# Patient Record
Sex: Male | Born: 1950
Health system: Southern US, Community
[De-identification: ages and names within clinical notes are randomized; demographics above are authoritative.]

## PROBLEM LIST (undated history)

## (undated) DIAGNOSIS — C028 Malignant neoplasm of overlapping sites of tongue: Secondary | ICD-10-CM

## (undated) DIAGNOSIS — I10 Essential (primary) hypertension: Secondary | ICD-10-CM

## (undated) DIAGNOSIS — J439 Emphysema, unspecified: Secondary | ICD-10-CM

## (undated) DIAGNOSIS — K219 Gastro-esophageal reflux disease without esophagitis: Secondary | ICD-10-CM

## (undated) DIAGNOSIS — G459 Transient cerebral ischemic attack, unspecified: Secondary | ICD-10-CM

## (undated) DIAGNOSIS — J449 Chronic obstructive pulmonary disease, unspecified: Secondary | ICD-10-CM

## (undated) DIAGNOSIS — L0211 Cutaneous abscess of neck: Secondary | ICD-10-CM

## (undated) DIAGNOSIS — N182 Chronic kidney disease, stage 2 (mild): Secondary | ICD-10-CM

## (undated) DIAGNOSIS — R600 Localized edema: Secondary | ICD-10-CM

## (undated) DIAGNOSIS — Z860100 Personal history of colon polyps, unspecified: Secondary | ICD-10-CM

## (undated) DIAGNOSIS — J45909 Unspecified asthma, uncomplicated: Secondary | ICD-10-CM

## (undated) DIAGNOSIS — R490 Dysphonia: Secondary | ICD-10-CM

## (undated) DIAGNOSIS — M1990 Unspecified osteoarthritis, unspecified site: Secondary | ICD-10-CM

## (undated) DIAGNOSIS — I7090 Unspecified atherosclerosis: Secondary | ICD-10-CM

## (undated) DIAGNOSIS — R350 Frequency of micturition: Secondary | ICD-10-CM

## (undated) DIAGNOSIS — F32A Depression, unspecified: Secondary | ICD-10-CM

## (undated) DIAGNOSIS — R7303 Prediabetes: Secondary | ICD-10-CM

## (undated) DIAGNOSIS — N390 Urinary tract infection, site not specified: Secondary | ICD-10-CM

## (undated) DIAGNOSIS — F0390 Unspecified dementia without behavioral disturbance: Secondary | ICD-10-CM

## (undated) DIAGNOSIS — E079 Disorder of thyroid, unspecified: Secondary | ICD-10-CM

## (undated) DIAGNOSIS — I639 Cerebral infarction, unspecified: Secondary | ICD-10-CM

## (undated) DIAGNOSIS — R569 Unspecified convulsions: Secondary | ICD-10-CM

## (undated) DIAGNOSIS — E785 Hyperlipidemia, unspecified: Secondary | ICD-10-CM

## (undated) DIAGNOSIS — F419 Anxiety disorder, unspecified: Secondary | ICD-10-CM

## (undated) DIAGNOSIS — R0600 Dyspnea, unspecified: Secondary | ICD-10-CM

## (undated) DIAGNOSIS — M109 Gout, unspecified: Secondary | ICD-10-CM

## (undated) DIAGNOSIS — F329 Major depressive disorder, single episode, unspecified: Secondary | ICD-10-CM

## (undated) DIAGNOSIS — R339 Retention of urine, unspecified: Secondary | ICD-10-CM

## (undated) HISTORY — PX: HERNIA REPAIR: SHX51

## (undated) HISTORY — PX: OTHER SURGICAL HISTORY: SHX169

---

## 2012-11-06 DIAGNOSIS — I639 Cerebral infarction, unspecified: Secondary | ICD-10-CM

## 2012-11-06 HISTORY — DX: Cerebral infarction, unspecified: I63.9

## 2015-01-16 ENCOUNTER — Emergency Department (HOSPITAL_COMMUNITY): Payer: Self-pay

## 2015-01-16 ENCOUNTER — Encounter (HOSPITAL_COMMUNITY): Payer: Self-pay | Admitting: Emergency Medicine

## 2015-01-16 ENCOUNTER — Emergency Department (HOSPITAL_COMMUNITY)
Admission: EM | Admit: 2015-01-16 | Discharge: 2015-01-16 | Disposition: A | Discharge status: Home / Self Care | Payer: Self-pay | Attending: Emergency Medicine | Admitting: Emergency Medicine

## 2015-01-16 DIAGNOSIS — R609 Edema, unspecified: Secondary | ICD-10-CM | POA: Insufficient documentation

## 2015-01-16 DIAGNOSIS — R600 Localized edema: Secondary | ICD-10-CM

## 2015-01-16 DIAGNOSIS — I7090 Unspecified atherosclerosis: Secondary | ICD-10-CM

## 2015-01-16 DIAGNOSIS — M7989 Other specified soft tissue disorders: Secondary | ICD-10-CM | POA: Insufficient documentation

## 2015-01-16 DIAGNOSIS — J441 Chronic obstructive pulmonary disease with (acute) exacerbation: Secondary | ICD-10-CM | POA: Insufficient documentation

## 2015-01-16 DIAGNOSIS — Z8673 Personal history of transient ischemic attack (TIA), and cerebral infarction without residual deficits: Secondary | ICD-10-CM | POA: Insufficient documentation

## 2015-01-16 DIAGNOSIS — Z8639 Personal history of other endocrine, nutritional and metabolic disease: Secondary | ICD-10-CM | POA: Insufficient documentation

## 2015-01-16 DIAGNOSIS — I10 Essential (primary) hypertension: Secondary | ICD-10-CM | POA: Insufficient documentation

## 2015-01-16 HISTORY — DX: Transient cerebral ischemic attack, unspecified: G45.9

## 2015-01-16 HISTORY — DX: Disorder of thyroid, unspecified: E07.9

## 2015-01-16 HISTORY — DX: Chronic obstructive pulmonary disease, unspecified: J44.9

## 2015-01-16 HISTORY — DX: Localized edema: R60.0

## 2015-01-16 HISTORY — DX: Unspecified atherosclerosis: I70.90

## 2015-01-16 LAB — COMPREHENSIVE METABOLIC PANEL
ALBUMIN: 3.6 g/dL (ref 3.5–5.2)
ALK PHOS: 44 U/L (ref 39–117)
ALT: 18 U/L (ref 0–53)
AST: 18 U/L (ref 0–37)
Anion gap: 7 (ref 5–15)
BILIRUBIN TOTAL: 0.5 mg/dL (ref 0.3–1.2)
BUN: 11 mg/dL (ref 6–23)
CHLORIDE: 110 mmol/L (ref 96–112)
CO2: 25 mmol/L (ref 19–32)
Calcium: 8.6 mg/dL (ref 8.4–10.5)
Creatinine, Ser: 0.86 mg/dL (ref 0.50–1.35)
GFR calc Af Amer: >90 mL/min (ref >90)
GFR calc non Af Amer: >90 mL/min (ref >90)
Glucose, Bld: 102 mg/dL — ABNORMAL HIGH (ref 70–99)
Potassium: 3.7 mmol/L (ref 3.5–5.1)
SODIUM: 142 mmol/L (ref 135–145)
Total Protein: 6.1 g/dL (ref 6.0–8.3)

## 2015-01-16 LAB — CBC
HEMATOCRIT: 31.9 % — AB (ref 39.0–52.0)
Hemoglobin: 10.7 g/dL — ABNORMAL LOW (ref 13.0–17.0)
MCH: 32.1 pg (ref 26.0–34.0)
MCHC: 33.5 g/dL (ref 30.0–36.0)
MCV: 95.8 fL (ref 78.0–100.0)
Platelets: 304 10*3/uL (ref 150–400)
RBC: 3.33 MIL/uL — AB (ref 4.22–5.81)
RDW: 12.9 % (ref 11.5–15.5)
WBC: 5.7 10*3/uL (ref 4.0–10.5)

## 2015-01-16 LAB — BRAIN NATRIURETIC PEPTIDE: B Natriuretic Peptide: 28.9 pg/mL (ref 0.0–100.0)

## 2015-01-16 IMAGING — CR DG CHEST 2V
2 series · 2 of 2 positions shown · non-contrast
Comparison: None.

CLINICAL DATA: 63-year-old male with a history of leg swelling. No
chest complaints. Prior smoking history.

EXAM:
CHEST - 2 VIEW

[w chest pa]
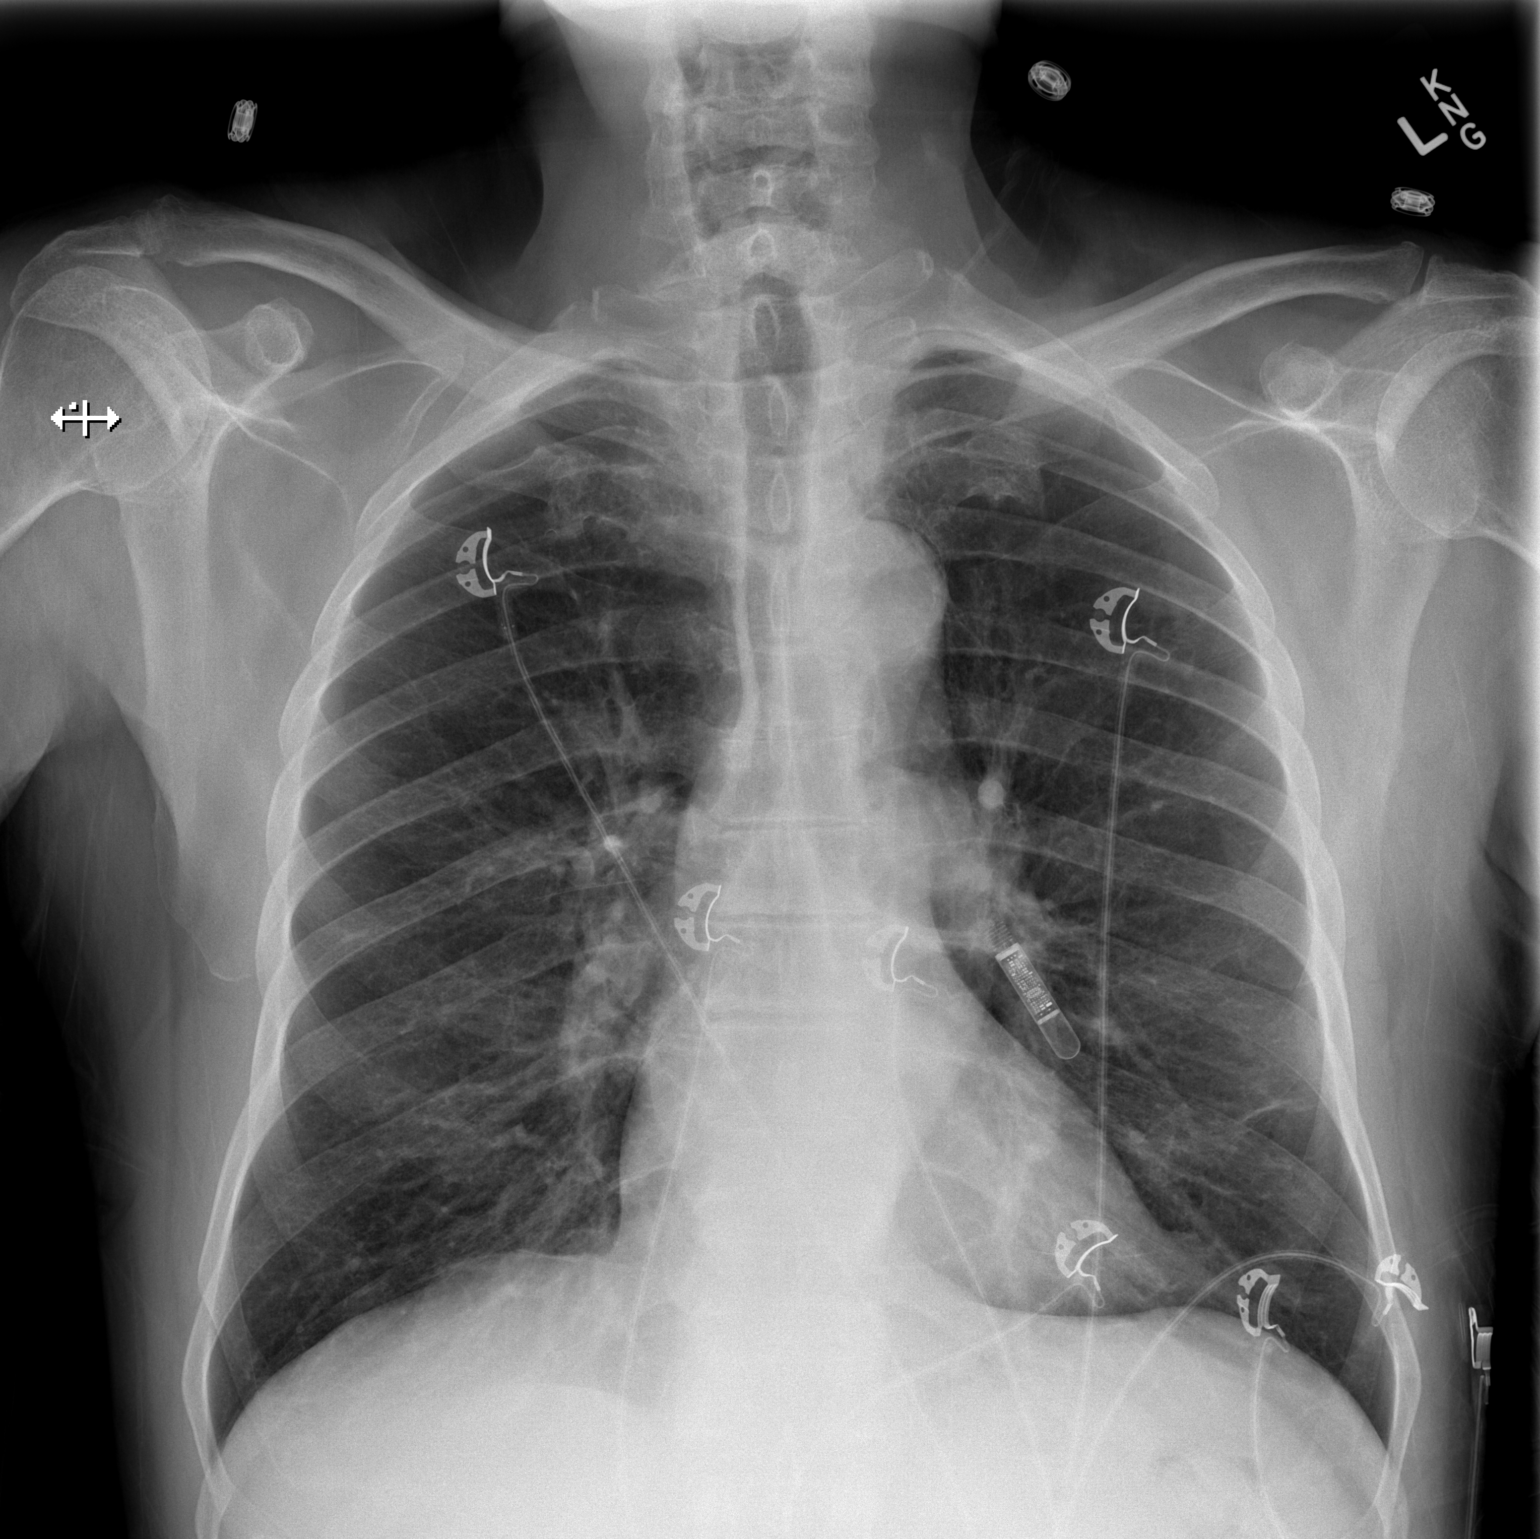

[w chest lat]
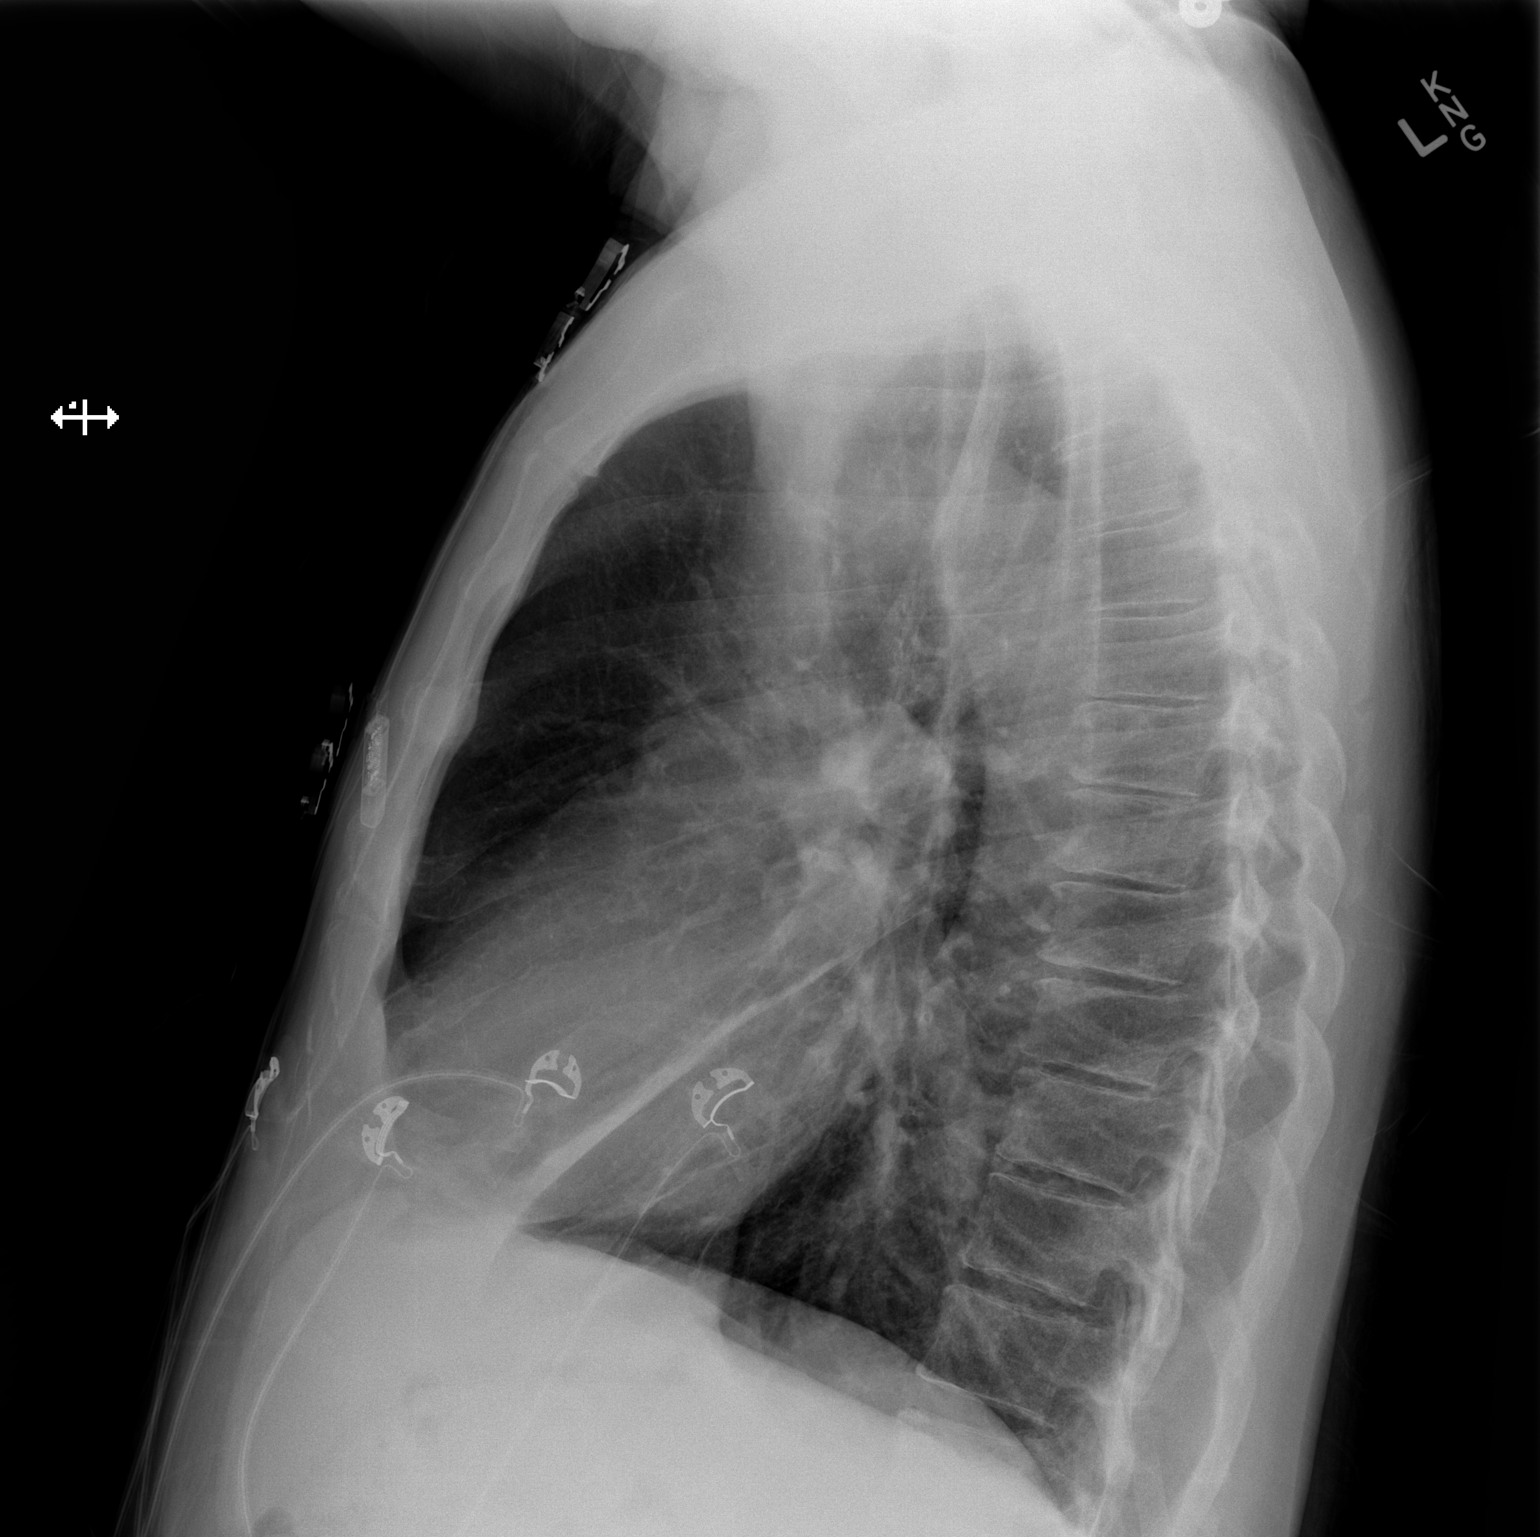

[2 of 2 positions shown; findings below may reference images not displayed]

FINDINGS: Cardiomediastinal silhouette demonstrates tortuosity of the thoracic
aorta and no cardiac enlargement. No evidence of pulmonary vascular
congestion. Atherosclerotic calcifications aortic arch.

Atherosclerotic calcifications aortic arch.

Stigmata of emphysema, with increased retrosternal airspace,
flattened hemidiaphragms, increased AP diameter, and hyperinflation
on the AP view.

Coarsened interstitial markings.

No pneumothorax. Electronic heart monitor projects over the left
hilar region and within the superficial soft tissues on the anterior
view.

Mild thickening of the fissure on the lateral view.

No displaced fracture.

Unremarkable appearance of the upper abdomen.
IMPRESSION: No radiographic evidence of acute cardiopulmonary disease, with
background of chronic changes.

Evidence of emphysema.

Atherosclerosis.

## 2015-01-16 NOTE — ED Notes (Signed)
Pt c/o bilateral lower leg swelling x 1 wk.  Denies any other sx.  Pain only when standing for long periods of time.

## 2015-01-16 NOTE — ED Provider Notes (Signed)
CSN: [id number]     Arrival date & time 01/16/15  3532 History   First MD Initiated Contact with Patient 01/16/15 0919     Chief Complaint  Patient presents with  . Leg Swelling     (Consider location/radiation/quality/duration/timing/severity/associated sxs/prior Treatment) Patient is a 64 y.o. male presenting with general illness. The history is provided by the patient.  Illness Location:  Bilateral leg swelling Quality:  Pitting edema in leg Severity:  Mild Onset quality:  Gradual Duration:  1 week Timing:  Constant Progression:  Worsening Chronicity:  New Context:  Spontaneously Relieved by:  Nothing Worsened by:  Nothing Associated symptoms: shortness of breath (mild, with exertion)   Associated symptoms: no abdominal pain, no cough, no fever and no vomiting     Past Medical History  Diagnosis Date  . TIA (transient ischemic attack)   . Thyroid disease   . COPD (chronic obstructive pulmonary disease)    Past Surgical History  Procedure Laterality Date  . T monitor     History reviewed. No pertinent family history. History  Substance Use Topics  . Smoking status: Never Smoker   . Smokeless tobacco: Not on file  . Alcohol Use: No    Review of Systems  Constitutional: Negative for fever.  Respiratory: Positive for shortness of breath (mild, with exertion). Negative for cough.   Gastrointestinal: Negative for vomiting and abdominal pain.  All other systems reviewed and are negative.     Allergies  Review of patient's allergies indicates no known allergies.  Home Medications   Prior to Admission medications   Not on File   BP 149/82 mmHg  Pulse 81  Temp(Src) 98.6 F (37 C) (Oral)  Resp 16  SpO2 100% Physical Exam  Constitutional: He is oriented to person, place, and time. He appears well-developed and well-nourished. No distress.  HENT:  Head: Normocephalic and atraumatic.  Mouth/Throat: Oropharynx is clear and moist. No oropharyngeal exudate.   Eyes: EOM are normal. Pupils are equal, round, and reactive to light.  Neck: Normal range of motion. Neck supple.  Cardiovascular: Normal rate and regular rhythm.  Exam reveals no friction rub.   No murmur heard. Pulmonary/Chest: Effort normal and breath sounds normal. No respiratory distress. He has no wheezes. He has no rales.  Abdominal: Soft. He exhibits no distension. There is no tenderness. There is no rebound.  Musculoskeletal: Normal range of motion. He exhibits edema (1+).  Neurological: He is alert and oriented to person, place, and time. No cranial nerve deficit. He exhibits normal muscle tone. Coordination normal.  Skin: No rash noted. He is not diaphoretic.  Nursing note and vitals reviewed.   ED Course  Procedures (including critical care time) Labs Review Labs Reviewed  CBC  COMPREHENSIVE METABOLIC PANEL  BRAIN NATRIURETIC PEPTIDE    Imaging Review Dg Chest 2 View  01/16/2015   CLINICAL DATA:  64 year old male with a history of leg swelling. No chest complaints. Prior smoking history.  EXAM: CHEST - 2 VIEW  COMPARISON:  None.  FINDINGS: Cardiomediastinal silhouette demonstrates tortuosity of the thoracic aorta and no cardiac enlargement. No evidence of pulmonary vascular congestion. Atherosclerotic calcifications aortic arch.  Atherosclerotic calcifications aortic arch.  Stigmata of emphysema, with increased retrosternal airspace, flattened hemidiaphragms, increased AP diameter, and hyperinflation on the AP view.  Coarsened interstitial markings.  No pneumothorax. Electronic heart monitor projects over the left hilar region and within the superficial soft tissues on the anterior view.  Mild thickening of the fissure on the lateral  view.  No displaced fracture.  Unremarkable appearance of the upper abdomen.  IMPRESSION: No radiographic evidence of acute cardiopulmonary disease, with background of chronic changes.  Evidence of emphysema.  Atherosclerosis.  Signed,  Dulcy Fanny.  Earleen Newport, DO  [facility]  [facility]   Electronically Signed   By: Corrie Mckusick D.O.   On: 01/16/2015 10:59     EKG Interpretation   Date/Time:  Saturday January 16 2015 09:40:22 EST Ventricular Rate:  78 PR Interval:  187 QRS Duration: 66 QT Interval:  375 QTC Calculation: 427 R Axis:   78 Text Interpretation:  Sinus rhythm Consider left atrial enlargement  Minimal ST elevation, anterior leads No prior for comparison Confirmed by  Mingo Amber  MD, Elysha Daw ([id number]) on 01/16/2015 9:43:02 AM      MDM   Final diagnoses:  Leg swelling  Peripheral edema    83M presents with 1 week of bilateral lower leg swelling. No recent travel. States some mild DOE. No chest pain. No fevers or cough. Just moved here from [place]. Hx of COPD, HTN, TIA. No cardiac history. AFVSS, small bilateral edema to both legs, 1+ pitting. Plan on CBC, CMP, BNP, CXR, EKG. Labs ok. EKG and CXR ok. Stable for discharge, instructed to use Tet hose and f/u with PCP. Given resource guide.    Evelina Bucy, MD 01/16/15 [phone]

## 2015-01-16 NOTE — Discharge Instructions (Signed)
Peripheral Edema You have swelling in your legs (peripheral edema). This swelling is due to excess accumulation of salt and water in your body. Edema may be a sign of heart, kidney or liver disease, or a side effect of a medication. It may also be due to problems in the leg veins. Elevating your legs and using special support stockings may be very helpful, if the cause of the swelling is due to poor venous circulation. Avoid long periods of standing, whatever the cause. Treatment of edema depends on identifying the cause. Chips, pretzels, pickles and other salty foods should be avoided. Restricting salt in your diet is almost always needed. Water pills (diuretics) are often used to remove the excess salt and water from your body via urine. These medicines prevent the kidney from reabsorbing sodium. This increases urine flow. Diuretic treatment may also result in lowering of potassium levels in your body. Potassium supplements may be needed if you have to use diuretics daily. Daily weights can help you keep track of your progress in clearing your edema. You should call your caregiver for follow up care as recommended. SEEK IMMEDIATE MEDICAL CARE IF:   You have increased swelling, pain, redness, or heat in your legs.  You develop shortness of breath, especially when lying down.  You develop chest or abdominal pain, weakness, or fainting.  You have a fever. Document Released: 11/30/2004 Document Revised: 01/15/2012 Document Reviewed: 11/10/2009 [facility] Patient Information 2015 [place], [place]. This information is not intended to replace advice given to you by your health care provider. Make sure you discuss any questions you have with your health care provider.   Emergency Department Resource Guide 1) Find a Doctor and Pay Out of Pocket Although you won't have to find out who is covered by your insurance plan, it is a good idea to ask around and get recommendations. You will then need to call the  office and see if the doctor you have chosen will accept you as a new patient and what types of options they offer for patients who are self-pay. Some doctors offer discounts or will set up payment plans for their patients who do not have insurance, but you will need to ask so you aren't surprised when you get to your appointment.  2) Contact Your Local Health Department Not all health departments have doctors that can see patients for sick visits, but many do, so it is worth a call to see if yours does. If you don't know where your local health department is, you can check in your phone book. The CDC also has a tool to help you locate your state's health department, and many state websites also have listings of all of their local health departments.  3) Find a [facility] If your illness is not likely to be very severe or complicated, you may want to try a walk in clinic. These are popping up all over the country in pharmacies, drugstores, and shopping centers. They're usually staffed by nurse practitioners or physician assistants that have been trained to treat common illnesses and complaints. They're usually fairly quick and inexpensive. However, if you have serious medical issues or chronic medical problems, these are probably not your best option.  No Primary Care Doctor: - Call Health Connect at  [phone] - they can help you locate a primary care doctor that  accepts your insurance, provides certain services, etc. - Physician Referral Service- [phone]  Chronic Pain Problems: Organization  Address  Phone   Notes  [facility]  [phone] Patients need to be referred by their primary care doctor.   Medication Assistance: Organization         Address  Phone   Notes  [facility] Medication [facility] [address] [place], [place] [address] [phone] --Must be a resident of [facility] -- Must have NO insurance coverage  whatsoever (no Medicaid/ Medicare, etc.) -- The pt. MUST have a primary care doctor that directs their care regularly and follows them in the community   [facility]ist  [phone]   [employer]  [phone]    Agencies that provide inexpensive medical care: Organization         Address  Phone   Notes  [place]  [phone]   [facility]    [phone]   [facility] [place],  [address] [phone]   [place] [address], [place] [phone]   Planned Parenthood    [phone]   [facility]    [phone]   [place] and [place], [place] Phone:  [phone], Fax:  [phone] Hours of Operation:  9 am - 6 pm, M-F.  Also accepts Medicaid/Medicare and self-pay.  [facility] for [place] [address], [address], [place] Phone: [phone], Fax: [phone]. Hours of Operation:  8:30 am - 5:30 pm, M-F.  Also accepts Medicaid and self-pay.  [facility] [address], [place] Phone: [phone]   [place], [place], [place] [phone], Ext. [phone] Mondays & Thursdays: 7-9 AM.  First 15 patients are seen on a first come, first serve basis.    [place] Providers:  Organization         Address  Phone   Notes  [facility] [address], [place] [phone] Also accepts self-pay patients.  [facility] [address], [place]  [phone]   [place], [address], [place] [phone]   [facility] [address], [place] [phone]   Lucianne Lei [address], [place]   [phone] Only accepts [place] Access [place] patients after they have their name applied  to their card.   Self-Pay (no insurance) in [facility]:  Organization         Address  Phone   Notes  Sickle Cell Patients, [facility] [place] [phone]   [facility] [place] [phone]   [facility] [place]  [place], [place], [place] [phone]   [facility]/Dr. Osei-Bonsu  [address] [place] or [address], [place] [phone] Phone number for both [place] and [place] locations is the same.  [facility] [address] [place] [phone]   [facility] [place] [address] [place] or [address] [phone]   [facility] [address], [place] [phone], phone; [phone], fax Sees patients 1st and 3rd Saturday of every month.  Must not qualify  for public or private insurance (i.e. Medicaid, Medicare, [place] Health Choice, Veterans' Benefits)  Household income should be no more than 200% of the poverty level The clinic cannot treat you if you are pregnant or think you are pregnant  Sexually transmitted diseases are not treated at the clinic.    Dental Care: Organization         Address  Phone  Notes  [facility] Department of [facility] [place] [phone] Accepts children up to age 53 who are enrolled in [place] or [place]; pregnant women with a Medicaid card; and children who have applied for Medicaid or [place] Health Choice, but were declined, whose parents can pay a reduced fee at time of service.  [facility] Department of [facility]  [address], [place] [phone] Accepts children up to age 57 who are enrolled in [place] or [place]; pregnant women with a Medicaid card; and children who have applied for Medicaid or [place]  Health Choice, but were declined, whose parents can pay a reduced fee at time of service.  [place] Adult Dental Access PROGRAM  [place] [phone] Patients are seen by appointment only. Walk-ins are not accepted. [place] will see patients 25 years of age and older. Monday - Tuesday (8am-5pm) Most Wednesdays (8:30-5pm) $30 per visit, cash only  [facility] Adult Dental Access PROGRAM  [address], [employer] [phone] Patients are seen by appointment only. Walk-ins are not accepted. [place] will see patients 42 years of age and older. One Wednesday Evening (Monthly: Volunteer Based).  $30 per visit, cash only  [place]  [phone] for adults; Children under age 40, call Graduate Pediatric Dentistry at [phone]. Children aged 55-14, please call [phone] to request a pediatric application.  Dental services are provided in all areas of dental care including fillings, crowns and bridges, complete and partial dentures, implants, gum treatment, root canals, and extractions. Preventive care is also provided. Treatment is provided to both adults and children. Patients are selected via a lottery and there is often a waiting list.   [facility] [address], [place]  [phone] [email or web address]   [facility] [address] [place], [place] [phone], Ext. [phone] Second and Fourth Thursday of each month, opens at 6:30 AM; Clinic ends at 9 AM.  Patients are seen on a first-come first-served basis, and a limited number are seen during each clinic.   [facility]  [address] Hillard Danker [place], [place] [phone]   Eligibility Requirements You must have lived in [place], [place], or [place] counties for at least the last three months.   You cannot be eligible for state or federal sponsored [employer], including [employer], [place], or [employer].    You generally cannot be eligible for healthcare insurance through your employer.    How to apply: Eligibility screenings are held every Tuesday and Wednesday afternoon from 1:00 pm until 4:00 pm. You do not need an appointment for the interview!  [facility] [address] [place], [place]   [place]  [place] Department  [place]  [phone]    Behavioral Health Resources in the Community: Intensive Outpatient Programs Organization         Address  Phone  Notes  [facility] [address], [place], [place] [phone]   [facility] Outpatient [address], [place], [place]   ADS: Alcohol & Drug Svcs [address], [place], [place]   [place] [address]  [place], [place] or [phone]   Substance Abuse Resources Organization         Address  Phone  Notes  Alcohol and Drug Services  [phone]   [place]  [phone]   [facility] [place]   Chinita Pester  [phone]   Residential & Outpatient Substance Abuse Program  [phone]   Psychological Services Organization         Address  Phone  Notes  [facility]  [place]  [phone]   [place] [address] [place] or [phone]    Mobile Crisis Teams Organization         Address  Phone  Notes  [facility], Mobile Crisis Care Unit  [phone]   [facility]  [address] [place], [place]   [place] [address], [place] [phone]    Self-Help/Support Groups Organization         Address  Phone             Notes  [place]. of [place] - variety of support groups  [place] Call  for more information  Narcotics Anonymous (NA), [facility] [address], [employer]   2 meetings at this location   Special educational needs teacher         Address  Phone  Notes  ASAP Residential Treatment [place],    [place]  [phone]   [facility]  [address], [place] [phone], [place], [place]   [place], [place] [phone] Admissions: 8am-3pm M-F  Incentives Substance [place] [address]    [place], [place] [phone]   [facility] [address] [place], [place], [place]   [facility] [address]  [place], [place]   [facility] - Intensive Outpatient [address] Kristeen [address], [place], [place]   [facility] ([place].,  [place], [place] [phone] or [phone]   [facility]idential Treatment Services (RTS) [address], [place], [place] Accepts Medicaid  [facility] [place] [address],  [place] [phone] Substance Abuse/Addiction Treatment   [facility] Organization         Address  Phone  Notes  [facility]  [phone]   Domenic Schwab, PhD [address] Arlis Port[place], [place]   [phone] or [phone]   [place]   [address] [place], [place] [phone]   [facility] [address], [place], [place] [phone] Insurance/Medicaid/sponsorship through [facility] and Families [address], [place]                                    [place], [place] [phone] [place] [address], [place] [phone]    Dr. Adele Schilder  [phone]   [facility] [place]  [facility]. 1) [address] [place] 2) [place] 3)  [place] [address], [place]  [phone]  [phone]   [place] [phone] or [phone] (After Hours)

## 2016-09-03 ENCOUNTER — Emergency Department (HOSPITAL_COMMUNITY)
Admission: EM | Admit: 2016-09-03 | Discharge: 2016-09-03 | Disposition: A | Discharge status: Home / Self Care | Payer: Self-pay | Attending: Emergency Medicine | Admitting: Emergency Medicine

## 2016-09-03 ENCOUNTER — Encounter (HOSPITAL_COMMUNITY): Payer: Self-pay | Admitting: *Deleted

## 2016-09-03 DIAGNOSIS — Z7982 Long term (current) use of aspirin: Secondary | ICD-10-CM | POA: Insufficient documentation

## 2016-09-03 DIAGNOSIS — L0211 Cutaneous abscess of neck: Secondary | ICD-10-CM | POA: Insufficient documentation

## 2016-09-03 DIAGNOSIS — L0291 Cutaneous abscess, unspecified: Secondary | ICD-10-CM

## 2016-09-03 DIAGNOSIS — Z8673 Personal history of transient ischemic attack (TIA), and cerebral infarction without residual deficits: Secondary | ICD-10-CM | POA: Insufficient documentation

## 2016-09-03 DIAGNOSIS — J449 Chronic obstructive pulmonary disease, unspecified: Secondary | ICD-10-CM | POA: Insufficient documentation

## 2016-09-03 HISTORY — DX: Cutaneous abscess of neck: L02.11

## 2016-09-03 MED ORDER — DOXYCYCLINE HYCLATE 100 MG PO CAPS: 100.0000 mg | ORAL_CAPSULE | Freq: Two times a day (BID) | ORAL | 0 refills | Status: DC

## 2016-09-03 MED ORDER — DOXYCYCLINE HYCLATE 100 MG PO TABS
100.0000 mg | ORAL_TABLET | Freq: Once | ORAL | Status: AC
  Administered 2016-09-03: 100 mg via ORAL
  Filled 2016-09-03: qty 1

## 2016-09-03 NOTE — ED Provider Notes (Addendum)
[facility] DEPT Provider Note   CSN: [id number] Arrival date & time: 09/03/16  1739     History   Chief Complaint Chief Complaint  Patient presents with  . Abscess    HPI Bruce Sullivan is a 65 y.o. male.  He presents for evaluation of a painful area on his right neck that is draining.  He states that his skin and this reveals normal week ago became reddened "like an elevated spot". Is draining some "blood and stuff" is painful. He presents for evaluation.   HPI  Past Medical History:  Diagnosis Date  . COPD (chronic obstructive pulmonary disease) ([place])   . Thyroid disease   . TIA (transient ischemic attack)     There are no active problems to display for this patient.   Past Surgical History:  Procedure Laterality Date  . t monitor         Home Medications    Prior to Admission medications   Medication Sig Start Date End Date Taking? Authorizing Provider  amLODipine (NORVASC) 5 MG tablet Take 5 mg by mouth every morning.    Historical Provider, MD  aspirin EC 81 MG tablet Take 81 mg by mouth every morning.    Historical Provider, MD  budesonide-formoterol (SYMBICORT) 80-4.5 MCG/ACT inhaler Inhale 2 puffs into the lungs 2 (two) times daily.    Historical Provider, MD  ibuprofen (ADVIL,MOTRIN) 800 MG tablet Take 800 mg by mouth every 8 (eight) hours as needed for moderate pain.    Historical Provider, MD  levETIRAcetam (KEPPRA) 500 MG tablet Take 250 mg by mouth 2 (two) times daily.    Historical Provider, MD    Family History No family history on file.  Social History Social History  Substance Use Topics  . Smoking status: Never Smoker  . Smokeless tobacco: Never Used  . Alcohol use No     Allergies   Review of patient's allergies indicates no known allergies.   Review of Systems Review of Systems  Constitutional: Negative for appetite change, chills, diaphoresis, fatigue and fever.  HENT: Negative for mouth sores, sore throat and trouble  swallowing.   Eyes: Negative for visual disturbance.  Respiratory: Negative for cough, chest tightness, shortness of breath and wheezing.   Cardiovascular: Negative for chest pain.  Gastrointestinal: Negative for abdominal distention, abdominal pain, diarrhea, nausea and vomiting.  Endocrine: Negative for polydipsia, polyphagia and polyuria.  Genitourinary: Negative for dysuria, frequency and hematuria.  Musculoskeletal: Negative for gait problem.  Skin: Positive for wound. Negative for color change, pallor and rash.  Neurological: Negative for dizziness, syncope, light-headedness and headaches.  Hematological: Does not bruise/bleed easily.  Psychiatric/Behavioral: Negative for behavioral problems and confusion.     Physical Exam Updated Vital Signs BP 139/94 (BP Location: Left Arm)   Pulse 71   Temp 98.3 F (36.8 C) (Oral)   Resp 18   SpO2 100%   Physical Exam  Constitutional: He is oriented to person, place, and time. He appears well-developed and well-nourished. No distress.  HENT:  Head: Normocephalic.  Eyes: Conjunctivae are normal. Pupils are equal, round, and reactive to light. No scleral icterus.  Neck: Normal range of motion. Neck supple. No thyromegaly present.    Cardiovascular: Normal rate and regular rhythm.  Exam reveals no gallop and no friction rub.   No murmur heard. Pulmonary/Chest: Effort normal and breath sounds normal. No respiratory distress. He has no wheezes. He has no rales.  Abdominal: Soft. Bowel sounds are normal. He exhibits no distension.  There is no tenderness. There is no rebound.  Musculoskeletal: Normal range of motion.  Neurological: He is alert and oriented to person, place, and time.  Skin: Skin is warm and dry. No rash noted.  Psychiatric: He has a normal mood and affect. His behavior is normal.     ED Treatments / Results  Labs (all labs ordered are listed, but only abnormal results are displayed) Labs Reviewed - No data to  display  EKG  EKG Interpretation None       Radiology No results found.  Procedures Procedures (including critical care time)  Medications Ordered in ED Medications - No data to display   Initial Impression / Assessment and Plan / ED Course  I have reviewed the triage vital signs and the nursing notes.  Pertinent labs & imaging results that were available during my care of the patient were reviewed by me and considered in my medical decision making (see chart for details).  Clinical Course    No surrounding tenderness or pain or lymphadenopathy. Bedside ultrasound shows subcutaneous fluid.  INCISION AND DRAINAGE Performed by: Lolita Patella Consent: Verbal consent obtained. Risks and benefits: risks, benefits and alternatives were discussed Type: abscess  Body area: right neck  Anesthesia: local infiltration  Incision was made with a scalpel.  Local anesthetic: lidocaine 1% c epinephrine  Anesthetic total: 3 ml  Complexity: complex Blunt dissection to break up loculations  Drainage: purulent  Drainage amount: scant  Packing material: 1/4 in iodoform gauze  Patient tolerance: Patient tolerated the procedure well with no immediate complications.    Final Clinical Impressions(s) / ED Diagnoses   Final diagnoses:  Abscess    Patient encouraged to remove the gauze in 24-48 hours. I also encouraged him to follow-up with his physician in 1-2 weeks. The skin in this area appears persistently abnormal it may require biopsy.  New Prescriptions New Prescriptions   No medications on file     Tanna Furry, MD 09/03/16 1832    Tanna Furry, MD 09/03/16 [phone]

## 2016-09-03 NOTE — ED Triage Notes (Signed)
Pt complains of boil to right neck with some drainage for the past week. Pt states pain is 10/10.

## 2016-09-03 NOTE — Discharge Instructions (Signed)
Remove the gauze in 24-48 hours.  Keep dressing covering the wound.  Recheck with your primary care physician in one to 2 weeks. If your skin appears to look abnormal this may require biopsy.

## 2016-12-23 ENCOUNTER — Encounter (HOSPITAL_COMMUNITY): Payer: Self-pay

## 2016-12-23 ENCOUNTER — Emergency Department (HOSPITAL_COMMUNITY)
Admission: EM | Admit: 2016-12-23 | Discharge: 2016-12-23 | Disposition: A | Discharge status: Home / Self Care | Payer: Medicare PPO | Attending: Emergency Medicine | Admitting: Emergency Medicine

## 2016-12-23 DIAGNOSIS — Z8673 Personal history of transient ischemic attack (TIA), and cerebral infarction without residual deficits: Secondary | ICD-10-CM | POA: Insufficient documentation

## 2016-12-23 DIAGNOSIS — J449 Chronic obstructive pulmonary disease, unspecified: Secondary | ICD-10-CM | POA: Insufficient documentation

## 2016-12-23 DIAGNOSIS — Z7982 Long term (current) use of aspirin: Secondary | ICD-10-CM | POA: Insufficient documentation

## 2016-12-23 DIAGNOSIS — Z79899 Other long term (current) drug therapy: Secondary | ICD-10-CM | POA: Diagnosis not present

## 2016-12-23 DIAGNOSIS — M79672 Pain in left foot: Secondary | ICD-10-CM | POA: Diagnosis present

## 2016-12-23 DIAGNOSIS — M109 Gout, unspecified: Secondary | ICD-10-CM | POA: Diagnosis not present

## 2016-12-23 MED ORDER — HYDROCODONE-ACETAMINOPHEN 5-325 MG PO TABS: 1.0000 | ORAL_TABLET | ORAL | 0 refills | Status: DC | PRN

## 2016-12-23 MED ORDER — INDOMETHACIN 25 MG PO CAPS: 25.0000 mg | ORAL_CAPSULE | Freq: Three times a day (TID) | ORAL | 0 refills | Status: DC

## 2016-12-23 NOTE — ED Notes (Signed)
Bed: WTR5 Expected date:  Expected time:  Means of arrival:  Comments: 

## 2016-12-23 NOTE — ED Provider Notes (Signed)
[facility] DEPT Provider Note   CSN: [id number] Arrival date & time: 12/23/16  1006  By signing my name below, I, [name], attest that this documentation has been prepared under the direction and in the presence of Clayton Bibles, PA-C Electronically Signed: [name], ED Scribe. 12/23/16. 10:38 AM.  History   Chief Complaint Chief Complaint  Patient presents with  . Foot Pain    HPI Bruce Sullivan is a 66 y.o. male with a PMHx of COPD, who presents to the Emergency Department complaining of left foot pain that began last night.  The pain is constant, thobbing. Pt reports associated left great toe pain, redness to left great toe, and gait problem due to pain. Pt has tried tying a string around his foot without medications with mild relief of his symptoms. Wife notes that when the left foot pain initially occurred about 1 month ago, it lasted for one day and subsided. He denies left foot swelling, fever, chills, right foot pain, and any other symptoms. Denies any injury.  Denies PMHx of DM or gout. Denies allergies to medications. Pt notes that he goes to the [facility] for his primary care.  The history is provided by the patient. No language interpreter was used.    Past Medical History:  Diagnosis Date  . COPD (chronic obstructive pulmonary disease) ([place])   . Thyroid disease   . TIA (transient ischemic attack)     There are no active problems to display for this patient.   Past Surgical History:  Procedure Laterality Date  . t monitor         Home Medications    Prior to Admission medications   Medication Sig Start Date End Date Taking? Authorizing Provider  amLODipine (NORVASC) 5 MG tablet Take 5 mg by mouth every morning.    Historical Provider, MD  aspirin EC 81 MG tablet Take 81 mg by mouth every morning.    Historical Provider, MD  budesonide-formoterol (SYMBICORT) 80-4.5 MCG/ACT inhaler Inhale 2 puffs into the lungs 2 (two) times daily.    Historical Provider, MD    doxycycline (VIBRAMYCIN) 100 MG capsule Take 1 capsule (100 mg total) by mouth 2 (two) times daily. 09/03/16   Tanna Furry, MD  HYDROcodone-acetaminophen (NORCO/VICODIN) 5-325 MG tablet Take 1-2 tablets by mouth every 4 (four) hours as needed for moderate pain or severe pain. 12/23/16   Clayton Bibles, PA-C  ibuprofen (ADVIL,MOTRIN) 800 MG tablet Take 800 mg by mouth every 8 (eight) hours as needed for moderate pain.    Historical Provider, MD  indomethacin (INDOCIN) 25 MG capsule Take 1-2 capsules (25-50 mg total) by mouth 3 (three) times daily with meals. During gout attack 12/23/16   Clayton Bibles, PA-C  levETIRAcetam (KEPPRA) 500 MG tablet Take 250 mg by mouth 2 (two) times daily.    Historical Provider, MD    Family History History reviewed. No pertinent family history.  Social History Social History  Substance Use Topics  . Smoking status: Never Smoker  . Smokeless tobacco: Never Used  . Alcohol use No     Allergies   Patient has no known allergies.   Review of Systems Review of Systems  Constitutional: Negative for chills and fever.  Cardiovascular: Negative for leg swelling.  Musculoskeletal: Positive for arthralgias (left foot pain) and gait problem (due to pain). Negative for joint swelling.  Skin: Positive for color change (redness to left great toe).  Allergic/Immunologic: Negative for immunocompromised state.  Neurological: Negative for weakness and  numbness.  Hematological: Does not bruise/bleed easily.  Psychiatric/Behavioral: Negative for self-injury.     Physical Exam Updated Vital Signs BP 161/95 (BP Location: Right Arm)   Pulse 94   Temp 98.7 F (37.1 C) (Oral)   Resp 18   SpO2 99%   Physical Exam  Constitutional: He appears well-developed and well-nourished. No distress.  HENT:  Head: Normocephalic and atraumatic.  Neck: Neck supple.  Pulmonary/Chest: Effort normal.  Musculoskeletal:       Left ankle: Normal.       Left foot: There is tenderness and  swelling.  Left first MTP with erythema, edema, warmth, and tenderness. No break in skin. Sensation intact. No other focal tenderness throughout foot or ankle.  Neurological: He is alert.  Skin: He is not diaphoretic.  Nursing note and vitals reviewed.    ED Treatments / Results  DIAGNOSTIC STUDIES: Oxygen Saturation is 99% on RA, nl by my interpretation.    COORDINATION OF CARE: 10:34 AM Discussed treatment plan with pt at bedside which includes crutches, indomethacin Rx, vicodin Rx, and pt agreed to plan.   Procedures Procedures (including critical care time)  Medications Ordered in ED Medications - No data to display   Initial Impression / Assessment and Plan / ED Course  I have reviewed the triage vital signs and the nursing notes.   Pt presents with monoarticular pain, swelling and erythema at 1st MTP.  Clinically c/w gout.  Pt is afebrile and stable. No imaging emergently indicated as there was no injury. Renal function historically good. Pt without known peptic ulcer disease and not receiving concurrent treatment on warfarin. Pt dc with indomethacin (50 mg PO TID), Vicodin Rx, and crutches.  Discussed crutches vs cane and pt chose crutches, feels he will be safer with crutches than without.  Advised close PCP follow up.  Discussed result, findings, treatment, and follow up  with patient.  Pt given return precautions.  Pt verbalizes understanding and agrees with plan.      Final Clinical Impressions(s) / ED Diagnoses   Final diagnoses:  Acute gout of left foot, unspecified cause    New Prescriptions Discharge Medication List as of 12/23/2016 10:41 AM    START taking these medications   Details  HYDROcodone-acetaminophen (NORCO/VICODIN) 5-325 MG tablet Take 1-2 tablets by mouth every 4 (four) hours as needed for moderate pain or severe pain., Starting Sat 12/23/2016, Print    indomethacin (INDOCIN) 25 MG capsule Take 1-2 capsules (25-50 mg total) by mouth 3 (three) times  daily with meals. During gout attack, Starting Sat 12/23/2016, Print      I personally performed the services described in this documentation, which was scribed in my presence. The recorded information has been reviewed and is accurate.    Clayton Bibles, PA-C 12/23/16 1219    Blanchie Dessert, MD 12/23/16 1626

## 2016-12-23 NOTE — ED Triage Notes (Addendum)
Pt reports "sharp, shooting, pins and needles left foot pain" that started worsened yesterday, but has been intermittent for weeks. Pt denies personal hx of DM. Pt reports familia hx of diabetes. Pt denies hx of gout. Pt denies traumatic injury. Left foot does not appear swollen.

## 2016-12-23 NOTE — Discharge Instructions (Signed)
Read the information below.  Use the prescribed medication as directed.  Please discuss all new medications with your pharmacist.  Do not take additional tylenol while taking the prescribed pain medication to avoid overdose.  You may return to the Emergency Department at any time for worsening condition or any new symptoms that concern you.   If you develop uncontrolled pain, weakness or numbness of the extremity, severe discoloration of the skin, or you are unable to walk, return to the ER for a recheck.

## 2017-01-04 NOTE — Other (Signed)
Pulmonary Function Test    Ruben [name]  [name] J. [name], MD  Service Date: 01/04/2017    REFERRING PHYSICIAN:  Dr. Karlene LinemanEarley     DESCRIPTION OF PROCEDURE AND FINDINGS:  The patient is assessed with  pre- and post-bronchodilator spirometry.  Testing meets  reproducibility and acceptability criteria.    FEV1 is 89% and FVC is 96%.  There is no change after bronchodilator.    IMPRESSION:  Normal spirometry.      Jacqulyn CaneJames J Carswell, MD  TR: *n DD: 01/05/2017 16:13 TD: 01/06/2017 01:18 Job#: [id number]  \\[id number]\\DOC#: [id number]  \\[id number]\\      cc:  Lucky RathkeMitchell Lee Earley DO  Signature Line    Electronically Signed on 01/08/2017 07:13 AM EST  ________________________________________________  Samule DryARSWELL-MD,  [name]

## 2017-02-13 NOTE — Discharge Summary (Signed)
 Inpatient Clinical Summary             [facility]  Post-Acute Care Transfer Instructions  PERSON INFORMATION   Name: Ruben [name], Ruben [name]  MRN: [id number]    FIN#: [id number]   PHYSICIANS  Admitting Physician: DONALYNN LOVING  Attending Physician: DONALYNN LOVING   PCP: DENNARD MERILEE CROME  Discharge Diagnosis:  Tongue lesion; Tongue ulcer  Comment:       PATIENT EDUCATION INFORMATION  Instructions:             Anesthesia: After Your Surgery-short form (CUSTOM); CENT General DC instructions (CUSTOM)  Medication Leaflets:               Follow-up:                          With: Address: When:   DONALYNN LOVING [address]  [place], [place]  [address]  [phone]    Comments:   Appointment Scheduled                                 Type Location Start [place]   [facility] 02/27/2017 09:00:00 02/27/2017 09:30:00 Confirmed          MEDICATION LIST  Medication Reconciliation at Discharge:         Medications that have not changed  Other Medications  levothyroxine 200 Microgram Oral (given by mouth) once a day (in the morning).  Last Dose:____________________  silodosin (Rapaflo 8 mg oral capsule) 1 Capsules Oral (given by mouth) once a day (in the evening).  Last Dose:____________________  simvastatin (Zocor) 40 Milligram Oral (given by mouth) Once a Day (at bedtime).  Last Dose:____________________  venlafaxine (venlafaxine 75 mg oral capsule, extended release) 1 Capsules Oral (given by mouth) once a day (in the evening)., DO NOT CRUSH  Last Dose:____________________         Patient's Final Home Medication List Upon Discharge:          levothyroxine 200 Microgram Oral (given by mouth) once a day (in the morning).  silodosin (Rapaflo 8 mg oral capsule) 1 Capsules Oral (given by mouth) once a day (in the evening).  simvastatin (Zocor) 40 Milligram Oral (given by mouth) Once a Day (at bedtime).  venlafaxine (venlafaxine 75 mg oral capsule, extended release) 1 Capsules Oral (given  by mouth) once a day (in the evening)., DO NOT CRUSH         Comment:       ORDERS         Order Name Order Details   Discharge Patient 02/13/17 14:46:00 EDT

## 2017-02-13 NOTE — Discharge Summary (Signed)
 Inpatient Patient Summary               [facility]  [address]  [place], [place] [address]  [phone]  Patient Discharge Instructions     Name: Ruben [name], Ruben [name]  Current Date: 02/13/2017 15:32:24  DOB: May 31, 1951 MRN: [id number] FIN: NBR%>[phone]  Patient Address: [address] [place] [facility] [address]  Patient Phone: [phone]  Primary Care Provider:  Name: Ruben [name]  Phone: [phone]   Immunizations Provided:       Discharge Diagnosis: Tongue lesion; Tongue ulcer  Discharged To: TO, ANTICIPATED%>  Home Treatments: TREATMENTS, ANTICIPATED%>  Devices/Equipment: EQUIPMENT REHAB%>  Post Hospital Services: HOSPITAL SERVICES%>  Professional Skilled Services: SKILLED SERVICES%>  Special Services and Community Resources:               SERV AND COMM RES, ANTICIPATED%>  Mode of Discharge Transportation: TRANSPORTATION%>  Discharge Orders         Discharge Patient 02/13/17 14:46:00 EDT         Comment:      Medications   During the course of your visit, your medication list was updated with the most current information. The details of those changes are reflected below:         Medications that have not changed  Other Medications  levothyroxine 200 Microgram Oral (given by mouth) once a day (in the morning).  Last Dose:____________________  silodosin (Rapaflo 8 mg oral capsule) 1 Capsules Oral (given by mouth) once a day (in the evening).  Last Dose:____________________  simvastatin (Zocor) 40 Milligram Oral (given by mouth) Once a Day (at bedtime).  Last Dose:____________________  venlafaxine (venlafaxine 75 mg oral capsule, extended release) 1 Capsules Oral (given by mouth) once a day (in the evening)., DO NOT CRUSH  Last Dose:____________________         [facility] would like to thank you for allowing us  to assist you with your healthcare needs. The following includes patient education materials and information regarding your injury/illness.     Ruben [name] has been  given the following list of follow-up instructions, prescriptions, and patient education materials:  Follow-up Instructions:             With: Address: When:   Ruben [name] [address]  [place], [place]  [address]  [phone]    Comments:   Appointment Scheduled                    Type Location Start [place]   [facility] 02/27/2017 09:00:00 02/27/2017 09:30:00 Confirmed              It is important to always keep an active list of medications available so that you can share with other providers and manage your medications appropriately. As an additional courtesy, we are also providing you with your final active medications list that you can keep with you.           levothyroxine 200 Microgram Oral (given by mouth) once a day (in the morning).  silodosin (Rapaflo 8 mg oral capsule) 1 Capsules Oral (given by mouth) once a day (in the evening).  simvastatin (Zocor) 40 Milligram Oral (given by mouth) Once a Day (at bedtime).  venlafaxine (venlafaxine 75 mg oral capsule, extended release) 1 Capsules Oral (given by mouth) once a day (in the evening)., DO NOT CRUSH      Take only the medications listed above. Contact your  doctor prior to taking any medications not on this list.        Discharge instructions, if any, will display below     Instructions for Diet: INSTRUCTIONS FOR DIET%>   Instructions for Supplements: SUPPLEMENT INSTRUCTIONS%>   Instructions for Activity: INSTRUCTIONS FOR ACTIVITY%>   Instructions for Wound Care: INSTRUCTIONS FOR WOUND CARE%>     Medication leaflets, if any, will display below     Patient education materials, if any, will display below           General Anesthesia, Adult, Care After    Refer to this sheet in the next few weeks. These instructions provide you with information on caring for yourself after your procedure. Your health care provider may also give you more specific instructions. Your treatment has been planned according to current medical practices,  but problems sometimes occur. Call your health care provider if you have any problems or questions after your procedure.    WHAT TO EXPECT AFTER THE PROCEDURE  After the procedure, it is typical to experience:  .  Sleepiness.  .  Nausea and vomiting.    HOME CARE INSTRUCTIONS  .  For the first 24 hours after general anesthesia:   ?  Have a responsible person with you.   ?  Do not drive a car. If you are alone, do not take public transportation.  ?  Do not drink alcohol.  ?  Do not take medicine that has not been prescribed by your health care provider.  ?  Do not sign important papers or make important decisions.  ?  You may resume a normal diet and activities as directed by your health care provider.  .  Change bandages (dressings) as directed.  .  If you have questions or problems that seem related to general anesthesia, call the hospital and ask for the anesthetist or anesthesiologist on call.     SEEK MEDICAL CARE IF:  .  You have nausea and vomiting that continue the day after anesthesia.  .  You develop a rash.    SEEK IMMEDIATE MEDICAL CARE IF:  .  You have difficulty breathing.   .  You have chest pain.  .  You have any allergic problems.  This information is not intended to replace advice given to you by your health care provider. Make sure you discuss any questions you have with your health care provider.    Document Released: 01/29/2001 Document Revised: 11/13/2014 Document Reviewed: 05/08/2013  [facility] Patient Information 2016 [place], [place].                       GENERAL DISCHARGE INSTRUCTIONS      FOLLOW UP CARE   If a follow up appointment was not made the day of surgery, please call the office to make one    ACTIVITY   Do not drive for 24 hours    DIET   Resume previous diet as tolerated    MEDICATIONS   Prescription (s) will be sent with you.  Use as directed.   See PAIN CONTROL sheet for additional instructions.      PROBLEMS TO WATCH FOR   Fever over 101.4      Call your doctor with ANY  problems that concern you:    Phone # [phone].  If you need immediate attention, go to the nearest Emergency Department.    I have read, been read, and verbally repeated back instructions and understand  them.  A copy has been given to me.  Date of Follow-Up Visit ___________________ Time ________________             IS IT A STROKE? Act FAST and Check for these signs:    FACE                         Does the face look uneven?    ARM                         Does one arm drift down?    SPEECH                    Does their speech sound strange?    TIME                         Call 9-1-1 at any sign of stroke  ---------------------------------------------------------------------------------------------------------------------------  Heart Attack Signs  Chest discomfort: Most heart attacks involve discomfort in the center of the chest and lasts more than a few minutes, or goes away and comes back. It can feel like uncomfortable pressure, squeezing, fullness or pain.  Discomfort in upper body: Symptoms can include pain or discomfort in one or both arms, back, neck, jaw or stomach.  Shortness of breath: With or without discomfort.  Other signs: Breaking out in a cold sweat, nausea, or lightheaded.  Remember, MINUTES DO MATTER. If you experience any of these heart attack warning signs, call 9-1-1 to get immediate medical attention!     ---------------------------------------------------------------------------------------------------------------------------  [facility] allows you to manage your health, view your test results, and retrieve your discharge documents from your hospital stay securely and conveniently from your computer.  To begin the enrollment process, visit [email or web address] Click on "Sign up now" under [facility].   Yes - Patient/Family/Caregiver demonstrates understanding of instructions given        ______________________________                                 ___________________     Patient/Family/ Caregiver Signature                                                           Date/Time     ______________________________                                 ___________________    Provider Signature                                                                                         Date/Time

## 2017-02-13 NOTE — Procedures (Signed)
IntraOp Record - [facility]             IntraOp Record - [facility] Summary                                                                   Primary Physician:        Sherrell Puller    Case Number:              [id number]    Finalized Date/Time:      02/13/17 14:32:23    Pt. Name:                 Ruben [name], Ruben [name]    D.O.B./Sex:               Mar 29, 1951    Male    Med Rec #:                [id number]    Physician:                Sherrell Puller    Financial #:              [id number]    Pt. Type:                 S    Room/Bed:                 /    Admit/Disch:              02/13/17 10:40:00 -    Institution:       [facility] - Case Times                                                                                                         Entry 1                                                                                                          Patient      In Room Time             02/13/17 12:36:00               Out Room Time                   02/13/17 14:26:00    Anesthesia     Procedure  Start Time               02/13/17 12:57:00               Stop Time                       02/13/17 14:20:00    Last Modified By:         Alonza Bogus                              02/13/17 14:31:41      SFOR - Case Times Audit                                                                          02/13/17 14:31:41         Owner: [id number]                              Modifier: [id number]                                                       <+> 1         Out Room Time     02/13/17 14:21:31         Owner: [id number]                              Modifier: [id number]                                                       <+> 1         Stop Time     02/13/17 13:42:24         Owner: [id number]                              Modifier: [id number]                                                       <+> 1         Start Time        [facility] - Case Attendance  Entry 1                         Entry 2                         Entry 3                                          Case Attendee             BEHRENS-MD,  EDWARD             GERDING-MD,  REX WILLIAM        MCLEES-CRNA,  [place] D    Role Performed            Surgeon Primary                 Anesthesiologist                CRNA    Time In                   02/13/17 12:36:00               02/13/17 12:36:00               02/13/17 12:36:00    Time Out     Procedure                 Glossectomy(Partial),                              Skin Graft Split                              Thickness    Last Modified By:         Docia Furl RN, Barbarann Ehlers, RN, Barbarann Ehlers, RNValarie Merino                              02/13/17 13:21:39               02/13/17 13:21:39               02/13/17 13:21:39                                Entry 4                         Entry 5                                                                          Case Attendee             Andree Elk, RN, Jenita Seashore  Docia Furl, RN, [facility]    Role Performed            Surgical Scrub                  Circulator    Time In                   02/13/17 12:36:00               02/13/17 12:36:00    Time Out     Procedure     Last Modified By:         Docia Furl RN, Barbarann Ehlers, RN, [facility]                              02/13/17 13:21:39               02/13/17 13:21:39    General Comments:            June Leap  -RNFA      SFOR - Case Attendance Audit                                                                     02/13/17 13:21:39         Owner: [id number]                              Modifier: [id number]                                                           1     <*> Procedure            1     <*> Procedure                              Glossectomy(Partial), Skin Graft Split Thickness            1     <*> Procedure                              Glossectomy(Partial), Skin Graft Split Thickness            2     <*> Time In            3      <*> Time In            4     <*> Time In            5     <*> Time In     02/13/17 13:10:00         Owner: [id number]  Modifier: [id number]                                                           2     <*> Case Attendee                          GERDING-MD,  Cristal Generous            2     <*> Case Attendee                          GERDING-MD,  Cristal Generous            2     <*> Role Performed            2     <*> Role Performed            3     <*> Case Attendee                          MCLEES-CRNA,  RACHELLE D            3     <*> Case Attendee                          MCLEES-CRNA,  RACHELLE D            3     <*> Role Performed            3     <*> Role Performed            4     <*> Case Attendee                          Andree Elk, RN, Jeani Hawking T            4     <*> Case Attendee                          Andree Elk, RN, Jeani Hawking T            4     <*> Role Performed            4     <*> Role Performed            5     <*> Case Attendee                          Docia Furl, RN, Valarie Merino            5     <*> Case Attendee                          Docia Furl, RN, Gabriel            5     <*> Role Performed            5     <*> Role Performed     02/13/17 13:03:21         Owner: [id number]  Modifier: [id number]                                                           1     <*> Time In            1     <*> Time In            1     <*> Time In            1     <*> Procedure            1     <*> Procedure            1     <*> Procedure                              Glossectomy(Partial), Skin Graft Split Thickness            1     <*> Procedure                              Glossectomy(Partial), Skin Graft Split Thickness            1     <*> Procedure                              Glossectomy(Partial), Skin Graft Split Thickness            1     <*> Procedure                              Glossectomy(Partial), Skin Graft Split Thickness        SFOR - Skin Assessment                                                                           Pre-Care Text:            A.240 Assesses baseline skin condition Im.120 Implements protective measures to prevent skin or tissue injury           due to mechanical sources  Im.280.1 Implements progective measures to prevent skin or tissue injury due to           thermal sources Im.360 Monitors for signs and symptons of infection                              Entry 1  Skin Integrity            Intact    Last Modified By:         Docia Furl, RN, Valarie Merino                              02/13/17 13:25:09    Post-Care Text:            E.10 Evaluates for signs and symptoms of physical injury to skin and tissue E.270 Evaluate tissue perfusion           O.60 Patient is free from signs and symptoms of injury caused by extraneous objects   O.210 Patinet's tissue           perfusion is consistent with or improved from baseline levels      SFOR - Patient Positioning                                                                      Pre-Care Text:            A.240 Assesses baseline skin condition A.280 Identifies baseline musculoskeletal status A.280.1 Identifies           physical alterations that require additional precautions for procedure-specific positioning A.510.8 Maintains           patient's dignity and privacy Im.120 Implements protective measures to prevent skin/tissue injury due to           mechanical sources Im.40 Positions the patient Im.80 Applies safety devices                              Entry 1                                                                                                          Body Position             Supine                          Left Arm Position               Extended on Padded Arm  Board w/Security Strap    Right Arm Position        Tucked and Padded at             Left Leg Position               Extended Security                              Side                                                            Strap, Pillow Under Knee    Right Leg Position        Extended Security               Feet Uncrossed                  Yes                              Strap, Pillow Under Knee    Pressure Points           Yes                             Positioning Device              Head Rest Donut, Heel    Checked                                                                                   pad, Foam Padding    Positioned By             Docia Furl, RN, Valarie Merino,             Outcome Met (O.80)              Yes                              MCLEES-CRNA,  RACHELLE                              D, BEHRENS-MD,  EDWARD    Last Modified By:         Docia Furl, RN, Valarie Merino                              02/13/17 13:23:14    Post-Care Text:            E.10 Evaluates for signs and symptoms of physical injury to skin and tissue E.290 Evaluates musculoskeletal           status O.80 Patient is free from signs and symptoms of injury related to positioning O.120 the patient is  free           from signs and symptoms of injury related to transfer/transport  O.250 Patient's musculoskeletal status is           maintained at or improved from baseline levels      SFOR - Skin Prep                                                                                Pre-Care Text:            A.30 Verifies allergies A.20 Verifies procedure, surgical site, and laterality A.510.8 Maintains paritnet's           dignity and privacy Im.270 Performs Skin Preparation Im.270.1 Implements protective measures to prevent skin           and tissue injury due to chemical sources  A.300.1 Protects from cross-contamination                              Entry 1                                                                                                          Hair Removal     Skin Prep      Prep Agents (Im.270)     Povidone Iodine Scrub            Prep Area (Im.270)              Chin, Cheek, Face                              7.5%     Prep By                  Sherrell Puller    Outcome Met (O.100)       Yes    Last Modified ByDocia Furl, RN, Valarie Merino                              02/13/17 13:26:08    Post-Care Text:            E.10 Evaluates for signs and symptoms of physical injury to skin and tissue O.100 Patient is free from signs           and symptoms of chemical injury  O.740 The patient's right to privacy is maintained      SFOR - Counts Initial and Final  Pre-Care Text:            A.91 Verifies operative procedure, sugical site, and laterality A.20.2 Assesses the risk for unintended           retained foreign body Im.20 Performs required counts                              Entry 1                                                                                                          Initial Counts      Initial Counts           Isear, RN, Valarie Merino,             Items included in               Sponges, Sharps     Performed By             Andree Elk, RN, Jenita Seashore               the Initial Count     Final Counts      Final Counts             Andree Elk, RN, Jenita Seashore,              Final Count Status              Correct     Performed By             Docia Furl, RN, Valarie Merino     Items Included in        Sponges, Sharps     Final Count     Outcome Met (O.20)        Yes    Last Modified By:         Docia Furl RN, Valarie Merino                              02/13/17 14:21:00    Post-Care Text:            E.50 Evaluates results of the surgical count O.20 Patient is free from unintended retained foreign objects      [facility] - Counts Initial and Final Audit                                                            02/13/17 14:21:00         Owner: [id number]                              Modifier: [id number]                                                       <+>  1         Final Count Status     02/13/17 14:19:25         Owner:  [id number]                              Modifier: [id number]                                                       <+> 1         Outcome Met (O.20)     02/13/17 13:22:12         Owner: [id number]                              Modifier: [id number]                                                       <+> 1         Final Counts Performed By        <+> 1         Items Included in Final Count        SFOR - Counts Additional                                                                        Pre-Care Text:            A.6 Verifies operative procedure, sugical site, and laterality A.20.2 Assesses the risk for unintended           retained foreign body Im.20 Performs required counts                              Entry 1                                                                                                          Additional Count          Closing Count                   Additional Count                Isear, RN, Valarie Merino,    Type  Participants                    Andree Elk, Therapist, sports, Jeani Hawking T    Count Status              Correct                         Items Counted                   Sponges, Sharps    Outcome Met (O.20)        Yes    Last Modified By:         Docia Furl RNValarie Merino                              02/13/17 13:43:04    Post-Care Text:            E.50 Evaluates results of the surgical count O.20 Patient is free from unintended retained foreign objects      SFOR - Counts Additional Audit                                                                   02/13/17 13:43:04         Owner: [id number]                              Modifier: [id number]                                                       <+> 1         Outcome Met (O.20)        SFOR - General Case Data                                                                        Pre-Care Text:            A.350.1 Classifies surgical wound                              Entry 1  Case Information      ASA Class                3                               Case Level                      Level 3     OR                       SF 01                           Specialty                       ENT (SN)     Wound Class              2-Clean-Contaminated    Preop Diagnosis           K14.8 / K14.0    Last Modified By:         Docia Furl RNValarie Merino                              02/13/17 13:03:55    Post-Care Text:            O.760 Patient receives consistent and comparable care regardless of the setting      SFOR - Fire Risk Assessment                                                                                               Entry 1                                                                                                          Fire Risk                 Alcohol Based Prep              Fire Risk Score                 2    Assessment: If            Solution, Ignition    checked, checkmark        Source In Use    = 1 point     Last Modified By:         Docia Furl, RN, Valarie Merino  02/13/17 13:10:25      SFOR - Time Out Procedure 1                                                                     Pre-Care Text:            A.10 Confirms patient identity A.20 Verifies operative procedure, surgical site, and laterality A.20.1 Verifies           consent for planned procedure A.30 Verifies allergies                              Entry 1                                                                                                          Is everyone ready         Yes                             Have all members of             Yes    to perform time out                                       the surgical team                                                               been introduced     Patient name and          Yes                             Allergies discussed             Yes    DOB confirmed     Surgical procedure        Yes                              Correct surgical                Yes    to be performed  site marked and     confirmed and                                             initials are     verified by                                               visible through     completed surgical                                        prepped and draped     consent                                                   field (or                                                               alternative ID band                                                               used)     Correct laterality        Yes                             Correct patient                 Yes    confirmed                                                 position confirmed     Surgeon shares            Yes                             Required blood                  Yes    operative plan,                                           products, implants,     possible  devices and/or     difficulties,                                             special equipment     expected duration,                                        available and     anticipated blood                                         sterility confirmed     loss and reviews     all     critical/specific     concerns     Essential imaging         Yes                             VTE prophylaxis                 Yes    available and fetal                                       addressed     heartones confirmed     (if applicable)     Antibiotics ordered       Yes                             Anesthesia shares               Yes    and administered                                          anesthetic plan and                                                               reviews patient                                                               specific concerns     Fire risk                 Yes                             Appropriate  drying              Yes    assessment scored  time for prep     and plan discussed                                        observed before                                                               draping     Surgeon states:           Yes                             Time Out Complete               02/13/17 12:57:00    Does anyone have     any concerns? If     you see, suspect,     or feel that     patient care is     being compromised,     speak up for     patient safety     Last Modified By:         Docia Furl, RN, Valarie Merino                              02/13/17 13:24:48    Post-Care Text:            E.30 Evaluates verification process for correct patient, site, side, and level surgery      SFOR - Debrief Procedure 1                                                                      Pre-Care Text:            Im.330 Manages specimen handling and disposition                              Entry 1                                                                                                          Final counts              Yes                             Actual procedure  Yes    correct and                                               performed confirmed     verbally verified     with     surgeon/licensed     independent     practitioner (if     applicable)     Postop diagnosis          Yes                             Wound                           Yes    confirmed                                                 classification                                                               confirmed     Confirm specimens         Yes                             Foley catheter                  Yes    and specimens                                             removed (if     labeled                                                   applicable)     appropriately     Patient recovery          Yes                             Debrief Complete                02/13/17  14:19:00    plan confirmed     Last Modified By:         Alonza Bogus                              02/13/17 14:19:17    Post-Care Text:            E.800 Ensures continuity of care E.50 Evaluates results of the surgical count O.30 Patient's procedure is  performed on the correct site, side, and level O.50 patient's current status is communicated throughout the           continuum of care O.40 Patient's specimen(s) is managed in the appropriate manner      SFOR - Debrief Procedure 1 Audit                                                                 02/13/17 14:19:17         Owner: [id number]                              Modifier: [id number]                                                       <+> 1         Debrief Complete        SFOR - Cautery                                                                                  Pre-Care Text:            A.240 Assesses baseline skin condition A280.1 Identifies baseline musculoskeletal status Im.50 Implements           protective measures to prevent injury due to electrical sources  Im.60 Uses supplies and equipment within safe           parameters Im.80 Applies safety devices                              Entry 1                                                                                                          ESU Type                  GENERATOR                       Identification                  [phone]  COVIDIEN/[employer]              Number     Coag Setting (watts)      15                              Cut Setting (watts)             25    Bipolar Setting           25                              Grounding Pad                   Yes    (watts)                                                   Needed?     Grounding Pad Site        Thigh, right                    Outcome Met (O.10)              Yes    Last Modified By:         Docia Furl, RN, Valarie Merino                              02/13/17 13:05:34    Post-Care Text:            E.10  Evaluates for signs and symptoms of physical injury to skin and tissue O.10 Patient is free from signs and           symptoms of injury related to thermal sources  O.70 Patient is free from signs and symptoms of electrical injury      SFOR - Patient Care Devices                                                                     Pre-Care Text:            A.200 Assesses risk for normothermia regulation A.40 Verifies presence of prosthetics or corrective devices           Im.280 Implements thermoregulation measures Im.60 Uses supplies and equipment within safe parameters                              Entry 1                         Entry 2  Equipment Type            MACHINE SEQUENTIAL              BAIR HUGGER                              COMPRESSION    SCD Sleeve Site           Legs Bilateral    Equipment/Tag Number      [id number]                          [id number]    Initiated Pre             Yes    Induction     Last Modified By:         Docia Furl, RN, Barbarann Ehlers, RN, [facility]                              02/13/17 13:22:55               02/13/17 13:22:55    Post-Care Text:            E.10 Evaluates signs and symptoms of physical injury to skin and tissue O.60 Patient is free from signs and           symptoms of injury caused by extraneous objects      SFOR - Medications                                                                              Pre-Care Text:            A.10 Confirms patient identity A.30 Verifies allergies Im.220 Administers prescribed medications Im.220.2           Administers prescribed antibiotic therapy as ordered                              Entry 1                         Entry 2                                                                          Time Administered     Medication                LIDOCAINE 1% W/EPI 20ML         BACITRACIN OINTMENT 0.9                              VIAL  GRAMS    Route of Admin            Local Injection                 Topical    Dose/Volume               10 ML                           0.9GRAMS    (include amount and     unit of measure)     Site                      Tongue                          Ear    Site Detail                                               Left    Administered By           Kerry Hough,  EDWARD    Outcome Met (O.130)       Yes                             Yes    Last Modified By:         Docia Furl, RN, Barbarann Ehlers, RN, [facility]                              02/13/17 14:19:12               02/13/17 13:54:42    Post-Care Text:            E.20 Evaluates response to medications O.130 Patient receives appropriately administerd medication(s)      SFOR - Medications Audit                                                                         02/13/17 14:19:12         Owner: [id number]                              Modifier: [id number]                                                           1     <*> Medication  LIDOCAINE 1% W/EPI 20ML VIAL            1     <*> Dose/Volume (include amount and        7 ML                      unit of measure)     02/13/17 13:54:42         Owner: [id number]                              Modifier: [id number]                                                       <+> 2         Medication        <+> 2         Route of Admin        <+> 2         Administered By        <+> 2         Dose/Volume (include amount and                      unit of measure)        <+> 2         Outcome Met (O.130)        <+> 2         Site        <+> 2         Site Detail        [facility] - Specimens                                                                                                          Entry 1                                                                                                          Description               LEFT LATERAL TONGUE:            Specimen Type                    Fresh  SHORT STITCH-ANTERIOR/                              LONG STITCH-SUPERIOR    Last Modified By:         Docia Furl, RN, Valarie Merino                              02/13/17 13:27:33      [facility] - Communication                                                                            Pre-Care Text:            A.520 Identifies barriers to communication (Patient and Family Communications) A.20 Verifies operative           procedure, surgical site, and laterality (Hand-off Communications) Im.500 Provides status reports to family           members Im.150 Develops individualized plan of care                              Entry 1                                                                                                          Communication             Phone Call                      Communication By                Alonza Bogus    Date and Time             02/13/17 13:41:00               Communication To                DEBRA-SPOUSE    Last Modified By:         Alonza Bogus                              02/13/17 13:42:50    Post-Care Text:            E.520 Evaluates psychosocial response to plan of care O.500 Patient or designated support person demonstrates           knowledge of the expected psychosocial responses to the procedure E.800 Ensures continuity of care O.50           Patient's current status is communicated throughout the continuum of care  [facility] - Procedures                                                                               Pre-Care Text:            A.20 Verifies operative procedure, surgical site, and laterality Im.150 Develops individualized plan of care                              Entry 1                         Entry 2                                                                          Procedure     Description      Procedure                Glossectomy                     Skin Graft Split                                                               Thickness     Modifiers                Partial     Surgical Procedure       PARTIAL GLOSSECTOMY             SPLIT THICKNESS SKIN     Text                                                     GRAFT    Primary Procedure         Yes                             No    Primary Surgeon           Candida Peeling    Start                     02/13/17 12:57:00               02/13/17 12:57:00    Stop  02/13/17 14:20:00               02/13/17 14:20:00    Anesthesia Type           General                         General    Surgical Service          ENT (SN)                        Plastic (SN)    Wound Class               2-Clean-Contaminated            1-Clean    Last Modified By:         Docia Furl RN, Barbarann Ehlers, RN, [facility]                              02/13/17 14:24:09               02/13/17 14:24:09    Post-Care Text:            O.730 The patient's care is consistent with the individualized perioperative plan of care      [facility] - Procedures Audit                                                                          02/13/17 14:24:09         Owner: [id number]                              Modifier: [id number]                                                       <+> 1         Stop        <+> 2         Stop     02/13/17 13:58:35         Owner: [id number]                              Modifier: [id number]                                                       <+> 1         Start        <+> 2         Start        [facility] - Transfer  Entry 1                                                                                                          Transferred By            Docia Furl, RN, Valarie Merino,             Via                             Stretcher                              [place],  [place]    Post-op Destination       PACU    Skin Assessment      Condition                 Intact    Last Modified By:         Alonza Bogus                              02/13/17 14:32:20      [place] - Transfer Audit                                                                            02/13/17 14:32:20         Owner: [id number]                              Modifier: [id number]                                                       <+> 1         Condition        Case Comments                                                                                         <None>              Finalized By: Alonza Bogus      Document  Signatures                                                                             Signed By:           Docia Furl, RN, Valarie Merino 02/13/17 14:32

## 2017-03-20 LAB — ABO/RH: ABO/Rh: O POS

## 2017-03-20 LAB — ANTIBODY SCREEN: Antibody Screen: NEGATIVE

## 2017-03-20 NOTE — Nursing Note (Signed)
Medication Administration Follow Up-Text       Medication Administration Follow Up Entered On:  03/20/2017 16:07 EDT    Performed On:  03/20/2017 16:04 EDT by Myer HaffYARBROUGH, RN, DONNA C      Intervention Information:     morphine  Performed by Myer HaffYARBROUGH, RN, DONNA C on 03/20/2017 16:04:00 EDT       morphine,4mg   IV Push,Forearm, Mid Left,other (see comment)       Med Response   ED Medication Response :   Continue to observe for symptoms   Numeric Rating Pain Scale :   7   [place] Opioid Induced Sedation Scale :   2 = Slightly drowsy, easily aroused   Pain Description Section :   Document assessment   Myer HaffYARBROUGH, RN, DONNA C - 03/20/2017 16:07 EDT   Pain Description   Time Pattern :   Constant   Pain Location :   Neck   Quality :   Mignon PineAching   YARBROUGH, RN, DONNA C - 03/20/2017 16:07 EDT

## 2017-03-20 NOTE — Procedures (Signed)
IntraOp Record - [facility]             IntraOp Record - [facility] Summary                                                                   Primary Physician:        Janalyn Rouse    Case Number:              [id number]    Finalized Date/Time:      03/20/17 15:37:31    Pt. Name:                 Ruben [name], Ruben [name]    D.O.B./Sex:               1951-04-04    Male    Med Rec #:                [id number]    Physician:                Janalyn Rouse    Financial #:              [id number]    Pt. Type:                 S    Room/Bed:                 /    Admit/Disch:              03/20/17 09:56:00 -    Institution:       [facility] - Case Times                                                                                                         Entry 1                                                                                                          Patient      In Room Time             03/20/17 12:40:00               Out Room Time                   03/20/17 15:33:00    Anesthesia     Procedure  Start Time               03/20/17 13:08:00               Stop Time                       03/20/17 15:24:00    Last Modified By:         Narda Amber                              03/20/17 15:37:03      [facility] - Case Times Audit                                                                          03/20/17 15:37:03         Owner: Lawerance Cruel                               Modifier: HANNSA                                                        <+> 1         Out Room Time     03/20/17 15:32:50         Owner: Lawerance Cruel                               Modifier: HANNSA                                                        <+> 1         Stop Time        [facility] - Case Attendance                                                                                                    Entry 1                         Entry 2                         Entry 3  Case Attendee             BEHRENS-MD,  Roosevelt Locks, RN, Cindy Hazy, RN, Lynne Logan    Role Performed            Surgeon Primary                 Circulator                      Surgical Scrub    Time In                   03/20/17 12:40:00               03/20/17 12:40:00               03/20/17 12:40:00    Time Out                  03/20/17 15:33:00               03/20/17 15:33:00               03/20/17 15:33:00    Procedure                 Neck Dissection(Left,           Neck Dissection(Left,           Neck Dissection(Left,                              Cervical)                       Cervical)                       Cervical)    Last Modified By:         Lorelle Formosa RN, Maida Sale, RN, Maida Sale, RN, Dairl Ponder                              03/20/17 15:37:27               03/20/17 15:37:27               03/20/17 15:37:27                                Entry 4                         Entry 5                                                                          Case Attendee             Jacinto Reap  Keane Scrape    Role Performed            CRNA                            Anesthesiologist    Time In                   03/20/17 12:40:00               03/20/17 12:40:00    Time Out                  03/20/17 15:33:00               03/20/17 15:33:00    Procedure                 Neck Dissection(Left,           Neck Dissection(Left,                              Cervical)                       Cervical)    Last Modified By:         Lorelle Formosa RN, Maida Sale, RN, Dairl Ponder                              03/20/17 15:37:27               03/20/17 15:37:27    General Comments:            Ginnie Smart, FA      [facility] - Case Attendance Audit                                                                     03/20/17 15:37:27         Owner: Lawerance Cruel                               Modifier: HANNSA                                                            1     <*> Time Out            1     <*>  Procedure            1     <*> Procedure                              Neck Dissection(Left, Cervical)            1     <*> Procedure  Neck Dissection(Left, Cervical)            2     <*> Time In            2     <*> Time Out            2     <*> Procedure            2     <*> Procedure                              Neck Dissection(Left, Cervical)            2     <*> Procedure                              Neck Dissection(Left, Cervical)            3     <*> Time In            3     <*> Time Out            3     <*> Procedure            3     <*> Procedure                              Neck Dissection(Left, Cervical)            3     <*> Procedure                              Neck Dissection(Left, Cervical)            4     <*> Time In            4     <*> Time Out            4     <*> Procedure            4     <*> Procedure                              Neck Dissection(Left, Cervical)            4     <*> Procedure                              Neck Dissection(Left, Cervical)            5     <*> Time In            5     <*> Time Out            5     <*> Procedure            5     <*> Procedure                              Neck Dissection(Left, Cervical)            5     <*> Procedure  Neck Dissection(Left, Cervical)     03/20/17 13:13:36         Owner: HANNSA                               Modifier: HANNSA                                                            1     <*> Time In            1     <*> Time In            1     <*> Time In            1     <*> Procedure            1     <*> Procedure            1     <*> Procedure                              Neck Dissection(Left, Cervical)            1     <*> Procedure                              Neck Dissection(Left, Cervical)            1     <*> Procedure                              Neck Dissection(Left, Cervical)            1     <*> Procedure                              Neck Dissection(Left, Cervical)             2     <*> Case Attendee                          Lorelle Formosa, RN, Dairl Ponder            2     <*> Case Attendee                          Lorelle Formosa, RN, Dairl Ponder            2     <*> Case Attendee                          Lorelle Formosa, RN, Samantha L            2     <*> Role Performed            2     <*> Role Performed            2     <*> Role Performed  2     <*> Procedure            2     <*> Procedure            3     <*> Case Attendee                          Pernell Dupre, RN, Larita Fife T            3     <*> Case Attendee                          Pernell Dupre, RN, Lynne Logan            3     <*> Case Attendee                          Pernell Dupre, RN, Larita Fife T            3     <*> Role Performed            3     <*> Role Performed            3     <*> Role Performed            3     <*> Procedure            3     <*> Procedure            4     <*> Case Attendee                          SHOEMAKER-CRNA,  LEIGH S            4     <*> Case Attendee                          SHOEMAKER-CRNA,  LEIGH S            4     <*> Case Attendee                          SHOEMAKER-CRNA,  LEIGH S            4     <*> Role Performed            4     <*> Role Performed            4     <*> Role Performed            4     <*> Procedure            4     <*> Procedure            5     <*> Case Attendee                          SLATTERY-MD,  STEPHEN O            5     <*> Case Attendee                          SLATTERY-MD,  STEPHEN O  5     <*> Case Attendee                          SLATTERY-MD,  STEPHEN O            5     <*> Role Performed            5     <*> Role Performed            5     <*> Role Performed            5     <*> Procedure            5     <*> Procedure        [facility] - Skin Assessment                                                                          Pre-Care Text:            A.240 Assesses baseline skin condition Im.120 Implements protective measures to prevent skin or tissue injury           due to mechanical sources  Im.280.1  Implements progective measures to prevent skin or tissue injury due to           thermal sources Im.360 Monitors for signs and symptons of infection                              Entry 1                                                                                                          Skin Integrity            Intact    Last Modified By:         Lorelle Formosa, RN, Dairl Ponder                              03/20/17 13:16:38    Post-Care Text:            E.10 Evaluates for signs and symptoms of physical injury to skin and tissue E.270 Evaluate tissue perfusion           O.60 Patient is free from signs and symptoms of injury caused by extraneous objects   O.210 Patinet's tissue           perfusion is consistent with or improved from baseline levels      [facility] - Patient Positioning  Pre-Care Text:            A.240 Assesses baseline skin condition A.280 Identifies baseline musculoskeletal status A.280.1 Identifies           physical alterations that require additional precautions for procedure-specific positioning A.510.8 Maintains           patient's dignity and privacy Im.120 Implements protective measures to prevent skin/tissue injury due to           mechanical sources Im.40 Positions the patient Im.80 Applies safety devices                              Entry 1                                                                                                          Procedure                 Neck Dissection(Left,           Body Position                   Supine                              Cervical)    Left Arm Position         Tucked and Padded at            Right Arm Position              Tucked and Padded at                              Side                                                            Side    Left Leg Position         Extended Security               Right Leg Position              Extended Security                              Strap, Pillow  Under Knee                                        Strap, Pillow Under Knee    Feet Uncrossed            Yes  Pressure Points                 Yes                                                              Checked     Positioning Device        Head Rest Donut, Roll           Positioned By                   Lorelle Formosa, RN, Samantha L,                              Shoulder, Heel pad                                              SHOEMAKER-CRNA,  LEIGH                                                                                              S, BEHRENS-MD,  EDWARD    Outcome Met (O.80)        Yes    Last Modified By:         Lorelle Formosa, RN, Dairl Ponder                              03/20/17 13:16:06    Post-Care Text:            E.10 Evaluates for signs and symptoms of physical injury to skin and tissue E.290 Evaluates musculoskeletal           status O.80 Patient is free from signs and symptoms of injury related to positioning O.120 the patient is free           from signs and symptoms of injury related to transfer/transport  O.250 Patient's musculoskeletal status is           maintained at or improved from baseline levels      [facility] - Skin Prep                                                                                Pre-Care Text:            A.30 Verifies allergies A.20 Verifies procedure, surgical site, and laterality A.510.8 Maintains paritnet's           dignity  and privacy Im.270 Performs Skin Preparation Im.270.1 Implements protective measures to prevent skin           and tissue injury due to chemical sources  A.300.1 Protects from cross-contamination                              Entry 1                                                                                                          Hair Removal     Skin Prep      Prep Agents (Im.270)     Povidone Iodine                 Prep Area (Im.270)              Neck, Chest                              Solution 10%     Prep By                   Pernell Dupre, RN, Larita Fife T    Outcome Met (O.100)       Yes    Last Modified By:         Lorelle Formosa, RN, Dairl Ponder                              03/20/17 13:16:47    Post-Care Text:            E.10 Evaluates for signs and symptoms of physical injury to skin and tissue O.100 Patient is free from signs           and symptoms of chemical injury  O.740 The patient's right to privacy is maintained      [facility] - Counts Initial and Final                                                                 Pre-Care Text:            A.20 Verifies operative procedure, sugical site, and laterality A.20.2 Assesses the risk for unintended           retained foreign body Im.20 Performs required counts                              Entry 1  Initial Counts      Initial Counts           Lorelle Formosa, RN, Dairl Ponder,          Items included in               Sponges, Sharps     Performed By             Pernell Dupre, RN, Lynne Logan               the Initial Count     Final Counts      Final Counts             Lorelle Formosa, RN, Dairl Ponder,          Final Count Status              Correct     Performed By             Pernell Dupre, RN, Lynne Logan     Items Included in        Sponges, Sharps     Final Count     Outcome Met (O.20)        Yes    Last Modified By:         Lorelle Formosa, RN, Dairl Ponder                              03/20/17 15:33:16    Post-Care Text:            E.50 Evaluates results of the surgical count O.20 Patient is free from unintended retained foreign objects      [facility] - Counts Initial and Final Audit                                                            03/20/17 15:33:16         Owner: HANNSA                               Modifier: HANNSA                                                        <+> 1         Final Counts Performed By        <+> 1         Final Count Status        <+> 1         Items Included in Final Count        <+> 1         Outcome Met (O.20)        [facility] -  Counts Additional  Pre-Care Text:            A.20 Verifies operative procedure, sugical site, and laterality A.20.2 Assesses the risk for unintended           retained foreign body Im.20 Performs required counts                              Entry 1                                                                                                          Additional Count          Closing Count                   Additional Count                Lorelle Formosa, RN, Dairl Ponder,    Type                                                      Participants                    Pernell Dupre, RN, Larita Fife T    Count Status              Correct                         Items Counted                   Sponges, Sharps    Outcome Met (O.20)        Yes    Last Modified By:         Lorelle Formosa, RN, Dairl Ponder                              03/20/17 15:15:41    Post-Care Text:            E.50 Evaluates results of the surgical count O.20 Patient is free from unintended retained foreign objects      [facility] - General Case Data                                                                        Pre-Care Text:            A.350.1 Classifies surgical wound  Entry 1                                                                                                          Case Information      ASA Class                3                               Case Level                      Level 4     OR                       SF 02                           Specialty                       ENT (SN)     Wound Class              1-Clean    Preop Diagnosis           C02.9 / K14.8 / K14.0           Postop Diagnosis                C02.9 / K14.8 / K14.0    Last Modified By:         Lorelle Formosa RN, Dairl Ponder                              03/20/17 15:23:15    Post-Care Text:            O.760 Patient receives consistent and comparable care regardless of the setting      [facility] - General Case Data Audit                                                                    03/20/17 15:23:15         Owner: HANNSA                               Modifier: HANNSA                                                        <+> 1  Postop Diagnosis        [facility] - Fire Risk Assessment                                                                                               Entry 1                                                                                                          Fire Risk                 Surgical Site Above             Fire Risk Score                 2    Assessment: If            Xiphoid, Ignition    checked, checkmark        Source In Use    = 1 point     Last Modified By:         Lorelle Formosa, RN, Dairl Ponder                              03/20/17 13:15:36      [facility] - Time Out                                                                                 Pre-Care Text:            A.10 Confirms patient identity A.20 Verifies operative procedure, surgical site, and laterality A.20.1 Verifies           consent for planned procedure A.30 Verifies allergies                              Entry 1  Procedure                 Neck Dissection(Left,           Is everyone ready               Yes                              Cervical)                       to perform time out     Have all members of       Yes                             Patient name and                Yes    the surgical team                                         DOB confirmed     been introduced     Allergies discussed       Yes                             Surgical procedure              Yes                                                              to be performed                                                               confirmed and                                                               verified by                                                                completed surgical                                                               consent  Correct surgical          Yes                             Correct laterality              Yes    site marked and                                           confirmed     initials are     visible through     prepped and draped     field (or     alternative ID band     used)     Correct patient           Yes                             Surgeon shares                  Yes    position confirmed                                        operative plan,                                                               possible                                                               difficulties,                                                               expected duration,                                                               anticipated blood                                                               loss and reviews  all                                                               critical/specific                                                               concerns     Required blood            Yes                             Essential imaging               Yes    products, implants,                                       available and fetal     devices and/or                                            heartones confirmed     special equipment                                         (if applicable)     available and     sterility confirmed     VTE prophylaxis           Yes                             Antibiotics ordered             Yes    addressed                                                 and administered     Anesthesia shares         Yes                             Fire risk                       Yes    anesthetic plan and                                       assessment scored     reviews patient  and plan discussed     specific concerns     Appropriate drying        Yes                             Surgeon states:                 Yes    time for prep                                             Does anyone have     observed before                                           any concerns? If     draping                                                   you see, suspect,                                                               or feel that                                                               patient care is                                                               being compromised,                                                               speak up for                                                               patient safety     Time Out Complete         03/20/17 13:02:00    Last Modified By:         Lorelle Formosa, RN, Dairl Ponder  03/20/17 13:16:29    Post-Care Text:            E.30 Evaluates verification process for correct patient, site, side, and level surgery      [facility] - Debrief                                                                                  Pre-Care Text:            Im.330 Manages specimen handling and disposition                              Entry 1                                                                                                          Procedure                 Neck Dissection(Left,           Final counts                    Yes                              Cervical)                       correct and                                                               verbally verified                                                               with                                                               surgeon/licensed  independent                                                               practitioner (if                                                                applicable)     Actual procedure          Yes                             Postop diagnosis                Yes    performed confirmed                                       confirmed     Wound                     Yes                             Confirm specimens               Yes    classification                                            and specimens     confirmed                                                 labeled                                                               appropriately (if                                                               applicable)     Foley catheter            Yes                             Patient recovery                Yes  removed (if                                               plan confirmed     applicable)     Debrief Complete          03/20/17 15:22:00    Last Modified By:         Lorelle Formosa, RN, Dairl Ponder                              03/20/17 15:33:07    Post-Care Text:            E.800 Ensures continuity of care E.50 Evaluates results of the surgical count O.30 Patient's procedure is           performed on the correct site, side, and level O.50 patient's current status is communicated throughout the           continuum of care O.40 Patient's specimen(s) is managed in the appropriate manner      [facility] - Cautery                                                                                  Pre-Care Text:            A.240 Assesses baseline skin condition A280.1 Identifies baseline musculoskeletal status Im.50 Implements           protective measures to prevent injury due to electrical sources  Im.60 Uses supplies and equipment within safe           parameters Im.80 Applies safety devices                              Entry 1                                                                                                          ESU Type                  GENERATOR                       Identification                  [id number]                               COVIDIEN/[employer]              Number  Coag Setting (watts)      25                              Cut Setting (watts)             25    Bipolar Setting           30                              Grounding Pad                   Yes    (watts)                                                   Needed?     Grounding Pad Site        Thigh, left                     Grounding Reinaldo Meeker, Charity fundraiser, [employer] By     Outcome Met (O.10)        Yes    Last Modified By:         Lorelle Formosa, RN, Dairl Ponder                              03/20/17 13:15:11    Post-Care Text:            E.10 Evaluates for signs and symptoms of physical injury to skin and tissue O.10 Patient is free from signs and           symptoms of injury related to thermal sources  O.70 Patient is free from signs and symptoms of electrical injury      [facility] - Patient Care Devices                                                                     Pre-Care Text:            A.200 Assesses risk for normothermia regulation A.40 Verifies presence of prosthetics or corrective devices           Im.280 Implements thermoregulation measures Im.60 Uses supplies and equipment within safe parameters                              Entry 1  Equipment Type            MACHINE SEQUENTIAL              SCD Sleeve Site                 Foot Left                              COMPRESSION    Equipment/Tag Number      [id number]                          Initiated Pre                   Yes                                                              Induction     Last Modified By:         Lorelle Formosa, RN, Dairl Ponder                              03/20/17 13:15:55    Post-Care Text:            E.10 Evaluates signs and symptoms of physical injury to skin and tissue O.60 Patient is free from signs and           symptoms of injury  caused by extraneous objects      [facility] - Medications                                                                              Pre-Care Text:            A.10 Confirms patient identity A.30 Verifies allergies Im.220 Administers prescribed medications Im.220.2           Administers prescribed antibiotic therapy as ordered                              Entry 1                         Entry 2                                                                          Time Administered     Medication                LIDOCAINE 1% W/EPI         BACITRACIN OINTMENT  VIAL                            30GMS    Route of Admin            Local Injection                 Topical    Dose/Volume                                          2 ribbons    (include amount and     unit of measure)     Site                      Neck                            Neck    Site Detail                                               Left    Administered By           Denzil Magnuson,  EDWARD    Outcome Met (O.130)       Yes                             Yes    Last Modified By:         Lorelle Formosa, RN, Maida Sale, RN, Samantha L                              03/20/17 13:15:46               03/20/17 15:22:46    Post-Care Text:            E.20 Evaluates response to medications O.130 Patient receives appropriately administerd medication(s)      [facility] - Medications Audit                                                                         03/20/17 15:22:46         Owner: Lawerance Cruel                               Modifier: HANNSA                                                        <+> 2  Medication        <+> 2         Route of Admin        <+> 2         Administered By        <+> 2         Dose/Volume (include amount and                      unit of measure)        <+> 2         Outcome Met (O.130)        <+> 2         Site        <+> 2         Site Detail        [facility] - Specimens                                                                                                           Entry 1                         Entry 2                                                                          Description               left neck lymph node            left selctive neck                                                              dissection (long stitch                                                              level 2, short stitch                                                              level 3)    Specimen Type  Frozen                          Fresh    Date/Time in     Formalin (for     breast tissue only)     Last Modified By:         Lorelle Formosa, RN, Maida Sale, RN, Dairl Ponder                              03/20/17 14:04:23               03/20/17 15:08:14      [facility] - Specimens Audit                                                                           03/20/17 15:08:14         Owner: Lawerance Cruel                               Modifier: HANNSA                                                        <+> 2         Description        <+> 2         Specimen Type        [facility] - Tubes, Drains, Catheters                                                                 Pre-Care Text:            A.310 Identifies factors associated with an increased risk for hemorrhage or fluid and electrolyte imbalance           Im.250 Administers care to invasive device sites                              Entry 1                                                                                                          Device Description  DRAIN WOUND JACKSON             Location                        Neck                              PRATT 15FR W/ TROCAR    Location Detail           Left    Last Modified By:         Lorelle Formosa, RN, Dairl Ponder                              03/20/17 15:08:22    Post-Care Text:            E.340 Evaluates tubes and drains are intact and functioning as planned O.60  Patient is free from signs and           symptoms of injury caused by extraneous objects      [facility] - Communication                                                                            Pre-Care Text:            A.520 Identifies barriers to communication ([employer]) A.20 Verifies operative           procedure, surgical site, and laterality ([employer]) Im.500 Provides status reports to family           members Im.150 Develops individualized plan of care                              Entry 1                         Entry 2                                                                          Communication             Phone Call                      Phone Call    Communication By          Lorelle Formosa, RN, Maida Sale, RN, Dairl Ponder    Date and Time             03/20/17 14:05:00               03/20/17 14:33:00    Communication To          ATTEMPTED  deb    Last Modified By:         Lorelle Formosa, RN, Maida Sale, RN, Dairl Ponder                              03/20/17 14:09:41               03/20/17 14:33:26    Post-Care Text:            E.520 Evaluates psychosocial response to plan of care O.500 Patient or designated support person demonstrates           knowledge of the expected psychosocial responses to the procedure E.800 Ensures continuity of care O.50           Patient's current status is communicated throughout the continuum of care      [facility] - Communication Audit                                                                       03/20/17 14:33:26         Owner: HANNSA                               Modifier: HANNSA                                                        <+> 2         Communication        <+> 2         Communication By        <+> 2         Date and Time        <+> 2         Communication To        [facility] - Dressing/Packing                                                                         Pre-Care Text:             A.350 Assesses susceptibility for infection Im.250 Administers care to invasive devices Im.290 Administer care           to wound sites  Im.300 Implements aseptic technique                              Entry 1  Site                      Neck                            Site Details                    Left    Dressing Item     Details      Dressing Item            Wound Closure Strip     (Im.290)     Last Modified By:         Lorelle Formosa, RN, Dairl Ponder                              03/20/17 15:22:24    Post-Care Text:            E.320 Evaluate factors associted with increased risk for postoperative infection at the completion of the           procedure O.200 Patient's wound perfusion is consistent with or improved from baseline levels  O.Patient is           free from signs and symptoms of infection      [facility] - Procedures                                                                               Pre-Care Text:            A.20 Verifies operative procedure, surgical site, and laterality Im.150 Develops individualized plan of care                              Entry 1                                                                                                          Procedure     Description      Procedure                Neck Dissection                 Modifiers                       Left, Cervical     Surgical Procedure       LEFT CERVICAL     Text                     LYMPHADENECTOMY                              (  MODIFIED NECK) NIM    Primary Procedure         Yes                             Primary Surgeon                 Janalyn Rouse    Start                     03/20/17 13:08:00               Stop                            03/20/17 15:24:00    Anesthesia Type           General                         Surgical Service                ENT (SN)    Wound Class               1-Clean    Last Modified By:          Narda Amber                              03/20/17 15:32:53    Post-Care Text:            O.730 The patient's care is consistent with the individualized perioperative plan of care      [facility] - Procedures Audit                                                                          03/20/17 15:32:53         Owner: HANNSA                               Modifier: HANNSA                                                        <+> 1         Stop        [facility] - Transfer  Entry 1                                                                                                          Transferred By            Lorelle Formosa, RN, Dairl Ponder,          Via                             Stretcher                              Wilhelmenia Blase,  [facility]    Post-op Destination       PACU    Skin Assessment      Condition                Intact    Last Modified By:         Narda Amber                              03/20/17 15:22:54      [facility] - Transfer Audit                                                                            03/20/17 15:22:54         Owner: Lawerance Cruel                               Modifier: HANNSA                                                            1     <*> Transferred By                         Bonner P[facility] S        Case Comments                                                                                         <  None>              Finalized By: Lorelle Formosa, RN, Dairl Ponder      Document Signatures                                                                             Signed By:           Lorelle Formosa RN, Dairl Ponder 03/20/17 15:37

## 2017-03-20 NOTE — Nursing Note (Signed)
Medication Administration Follow Up-Text       Medication Administration Follow Up Entered On:  03/20/2017 18:11 EDT    Performed On:  03/20/2017 18:10 EDT by Tillie FantasiaHUTCHERSON,  MEGAN T      Intervention Information:     acetaminophen-hydrocodone  Performed by Tillie FantasiaHUTCHERSON,  MEGAN T on 03/20/2017 17:15:00 EDT       HYDROcodone-acetaminophen,6015mL  Oral,moderate pain (4-7)       Med Response   ED Medication Response :   No adverse reaction, Symptoms improved   Numeric Rating Pain Scale :   0 = No pain   Pasero Opioid Induced Sedation Scale :   1 = Awake and alert   Respiratory Rate :   16 br/min   HUTCHERSON,  MEGAN T - 03/20/2017 18:10 EDT

## 2017-03-20 NOTE — Nursing Note (Signed)
Medication Administration Follow Up-Text       Medication Administration Follow Up Entered On:  03/20/2017 16:18 EDT    Performed On:  03/20/2017 16:14 EDT by Myer Haff, RN, DONNA C      Intervention Information:     morphine  Performed by Myer Haff, RN, DONNA C on 03/20/2017 16:14:00 EDT       morphine,2mg   IV Push,Forearm, Mid Right,other (see comment)       Med Response   ED Medication Response :   Continue to observe for symptoms   Numeric Rating Pain Scale :   6   Pasero Opioid Induced Sedation Scale :   2 = Slightly drowsy, easily aroused   Pain Description Section :   Document assessment   Myer Haff RN, DONNA C - 03/20/2017 16:17 EDT   Pain Description   Time Pattern :   Constant   Pain Location :   Neck   Quality :   Mignon Pine, RN, DONNA C - 03/20/2017 16:17 EDT

## 2017-03-20 NOTE — Nursing Note (Signed)
 Adult Patient History Form-Text       Adult Patient History Entered On:  03/20/2017 16:56 EDT    Performed On:  03/20/2017 16:55 EDT by BENARD BOUCHARD T               General Info   Patient Identified 2018 :   Identification band, Verbal   Patient Identified :   Ruben [name]   Information Given By 2018 :   Self   Accompanied By 2018 :   None   In Charge of News (ICON) Name :   Haroldine Daring   Pregnancy Status :   N/A   Has the patient received chemotherapy or biotherapy within the last 48 hours? :   No   In Clinical Trial With Signed Consent for Related Condition :   N/A   Is the patient currently (2-3 days) receiving radiation treatment? :   No   HUTCHERSON,  MEGAN T - 03/20/2017 16:55 EDT   Allergies   (As Of: 03/20/2017 16:56:47 EDT)   Allergies (Active)   No Known Allergies  Estimated Onset Date:   Unspecified ; Created By:   Teresa Spates D; Reaction Status:   Active ; Category:   Drug ; Substance:   No Known Allergies ; Type:   Allergy ; Updated By:   Teresa Spates D; Source:   Paper Chart/Abstracting ; Reviewed Date:   03/20/2017 16:55 EDT        Medication History   Medication List   (As Of: 03/20/2017 16:56:47 EDT)   Normal Order    Dextrose 5% with 0.45% NaCl and KCl 10 mEq/L intravenous solution 1,000 mL  :   Dextrose 5% with 0.45% NaCl and KCl 10 mEq/L intravenous solution 1,000 mL ; Status:   Ordered ; Ordered As Mnemonic:   Dextrose 5% with 0.45% NaCl and KCl 10 mEq/L 1,000 mL ; Simple Display Line:   50 mL/hr, IV ; Ordering Provider:   DONALYNN LOVING; Catalog Code:   Dextrose 5% with 0.45% NaCl and KCl 10 m ; Order Dt/Tm:   03/20/2017 16:54:17          Lactated Ringers Injection solution 1,000 mL  :   Lactated Ringers Injection solution 1,000 mL ; Status:   Ordered ; Ordered As Mnemonic:   Lactated Ringers Injection 1,000 mL ; Simple Display Line:   40 mL/hr, IV ; Ordering Provider:   DONALYNN LOVING; Catalog Code:   Lactated Ringers Injection ; Order Dt/Tm:   03/20/2017 11:17:24 ; Comment:   Perioperative  use ONLY  For Non Dialysis Patient  KVO          Sodium Chloride 0.9%  solution 500 mL  :   Sodium Chloride 0.9%  solution 500 mL ; Status:   Discontinued ; Ordered As Mnemonic:   Sodium Chloride 0.9% 500 mL ; Simple Display Line:   10 mL/hr, IV ; Ordering Provider:   DONALYNN LOVING; Catalog Code:   Sodium Chloride 0.9% ; Order Dt/Tm:   03/20/2017 11:17:25 ; Comment:   Perioperative use ONLY  For Dialysis Patient          ceFAZolin  :   ceFAZolin ; Status:   Ordered ; Ordered As Mnemonic:   ceFAZolin ; Simple Display Line:   1 g, IV Piggyback, q8hr ; Ordering Provider:   DONALYNN LOVING; Catalog Code:   ceFAZolin ; Order Dt/Tm:   03/20/2017 16:54:19  ibuprofen 400 mg Tab  :   ibuprofen 400 mg Tab ; Status:   Ordered ; Ordered As Mnemonic:   ibuprofen ; Simple Display Line:   400 mg, 1 tabs, Oral, q8hr ; Ordering Provider:   DONALYNN LOVING; Catalog Code:   ibuprofen ; Order Dt/Tm:   03/20/2017 16:54:20 ; Comment:   DO NOT CRUSH          acetaminophen-HYDROcodone 325 mg-7.5 mg/15 mL Oral Soln 15 mL  :   acetaminophen-HYDROcodone 325 mg-7.5 mg/15 mL Oral Soln 15 mL ; Status:   Ordered ; Ordered As Mnemonic:   HYDROcodone-acetaminophen 7.5 mg-325 mg/15 mL oral solution ; Simple Display Line:   15 mL, Oral, q6hr, PRN: moderate pain (4-7) ; Ordering Provider:   DONALYNN LOVING; Catalog Code:   HYDROcodone-acetaminophen ; Order Dt/Tm:   03/20/2017 16:54:20 ; Comment:   Max dose acetaminophen 4g/24hrs          morphine 5 mg/mL preservative-free Sol  :   morphine 5 mg/mL preservative-free Sol ; Status:   Ordered ; Ordered As Mnemonic:   morphine range dose ; Simple Display Line:   5 mg, 1 mL, IV Push, q3hr, PRN: severe pain (8-10) ; Ordering Provider:   DONALYNN LOVING; Catalog Code:   morphine ; Order Dt/Tm:   03/20/2017 16:54:19 ; Comment:   Max dose = 25 mg          ondansetron 2 mg/mL Inj Soln 2 mL  :   ondansetron 2 mg/mL Inj Soln 2 mL ; Status:   Ordered ; Ordered As Mnemonic:   ondansetron ;  Simple Display Line:   4 mg, 2 mL, IV Push, q6hr, PRN: nausea ; Ordering Provider:   DONALYNN LOVING; Catalog Code:   ondansetron ; Order Dt/Tm:   03/20/2017 16:54:19          morphine 2 mg/mL preservative-free Sol  :   morphine 2 mg/mL preservative-free Sol ; Status:   Discontinued ; Ordered As Mnemonic:   morphine range dose ; Simple Display Line:   5 mg, 2.5 mL, IV Push, q5min, PRN: other (see comment) ; Ordering Provider:   GURNEY GARNETTE KIDD; Catalog Code:   morphine ; Order Dt/Tm:   03/20/2017 15:36:37 ; Comment:   For Pain  Max dose = 10 mg Primary choice          ondansetron 2 mg/mL Inj Soln 2 mL  :   ondansetron 2 mg/mL Inj Soln 2 mL ; Status:   Completed ; Ordered As Mnemonic:   ondansetron ; Simple Display Line:   4 mg, 2 mL, IV Push, Once, PRN: nausea ; Ordering Provider:   GURNEY GARNETTE KIDD; Catalog Code:   ondansetron ; Order Dt/Tm:   03/20/2017 15:36:37 ; Comment:   Primary choice          A Patient Specific Medication  :   A Patient Specific Medication ; Status:   Ordered ; Ordered As Mnemonic:   A Patient Specific Medication ; Simple Display Line:   1 EA, Kit-Combo, q53min, PRN: other (see comment) ; Ordering Provider:   DONALYNN LOVING; Catalog Code:   A Patient Specific Medication ; Order Dt/Tm:   03/20/2017 11:17:48 ; Comment:   to access the patient specific medication drawer          A Patient Specific Refrigerated Medication  :   A Patient Specific Refrigerated Medication ; Status:   Ordered ; Ordered As Mnemonic:  A Patient Specific Refrigerated Medication ; Simple Display Line:   1 EA, Kit-Combo, q5min, PRN: other (see comment) ; Ordering Provider:   DONALYNN LOVING; Catalog Code:   A Patient Specific Refrigerated Medicati ; Order Dt/Tm:   03/20/2017 11:17:49 ; Comment:   to access the patient specific Refrigerated medications          lidocaine 1% PF Inj Soln 2 mL  :   lidocaine 1% PF Inj Soln 2 mL ; Status:   Ordered ; Ordered As Mnemonic:   lidocaine 1% preservative-free  injectable solution ; Simple Display Line:   0.5 mL, ID, q5min, PRN: other (see comment) ; Ordering Provider:   DONALYNN LOVING; Catalog Code:   lidocaine ; Order Dt/Tm:   03/20/2017 11:17:49 ; Comment:   to access lidocaine 1%  2 mL vial for IV start and Life Port access          Respiratory MDI Treatment  :   Respiratory MDI Treatment ; Status:   Ordered ; Ordered As Mnemonic:   Respiratory MDI Treatment ; Simple Display Line:   1 EA, Kit-Combo, q5min, PRN: other (see comment) ; Ordering Provider:   DONALYNN LOVING; Catalog Code:   Respiratory MDI Treatment ; Order Dt/Tm:   03/20/2017 11:17:49          sodium chloride 0.9% Inj Soln 10 mL syringe  :   sodium chloride 0.9% Inj Soln 10 mL syringe ; Status:   Ordered ; Ordered As Mnemonic:   sodium chloride 0.9% flush syringe range dose ; Simple Display Line:   30 mL, IV Push, q5min, PRN: other (see comment) ; Ordering Provider:   DONALYNN LOVING; Catalog Code:   sodium chloride flush ; Order Dt/Tm:   03/20/2017 11:17:50 ; Comment:   for access to sodium chloride 0.9% syringe for INT flush if needed          sodium chloride 0.9% Inj Soln 10 mL vial  :   sodium chloride 0.9% Inj Soln 10 mL vial ; Status:   Ordered ; Ordered As Mnemonic:   sodium chloride 0.9% vial for reconstitution range dose ; Simple Display Line:   30 mL, IV Push, q5min, PRN: other (see comment) ; Ordering Provider:   DONALYNN LOVING; Catalog Code:   sodium chloride flush ; Order Dt/Tm:   03/20/2017 11:17:49 ; Comment:   for access to sodium chloride 0.9% vial when needed as a diluent for reconstitutable medications          sterile water Inj Soln 10 mL  :   sterile water Inj Soln 10 mL ; Status:   Ordered ; Ordered As Mnemonic:   sterile water for reconstitution ; Simple Display Line:   10 mL, N/A, q5min, PRN: other (see comment) ; Ordering Provider:   DONALYNN LOVING; Catalog Code:   sterile water for reconstitution ; Order Dt/Tm:   03/20/2017 11:17:50 ; Comment:   to access sterile  water when needed as a diluent for reconstitutable medications. Not for IV use.            Home Meds    ibuprofen  :   ibuprofen ; Status:   Documented ; Ordered As Mnemonic:   ibuprofen ; Simple Display Line:   600 mg, Oral, QID, PRN: mild pain (1-3), 0 Refill(s) ; Catalog Code:   ibuprofen ; Order Dt/Tm:   03/15/2017 90:71:85          levothyroxine  :   levothyroxine ;  Status:   Documented ; Ordered As Mnemonic:   levothyroxine ; Simple Display Line:   200 mcg, Oral, qAM, 0 Refill(s) ; Catalog Code:   levothyroxine ; Order Dt/Tm:   02/08/2017 14:36:07          silodosin  :   silodosin ; Status:   Documented ; Ordered As Mnemonic:   Rapaflo 8 mg oral capsule ; Simple Display Line:   8 mg, 1 caps, Oral, qPM, 0 Refill(s) ; Catalog Code:   silodosin ; Order Dt/Tm:   02/08/2017 14:37:22          simvastatin  :   simvastatin ; Status:   Documented ; Ordered As Mnemonic:   Zocor ; Simple Display Line:   40 mg, Oral, Once a Day (at bedtime), 0 Refill(s) ; Catalog Code:   simvastatin ; Order Dt/Tm:   02/08/2017 14:36:41          venlafaxine  :   venlafaxine ; Status:   Documented ; Ordered As Mnemonic:   venlafaxine 75 mg oral capsule, extended release ; Simple Display Line:   75 mg, 1 caps, Oral, qPM, 30 caps, 0 Refill(s) ; Ordering Provider:   DENNARD MERILEE CROME; Catalog Code:   venlafaxine ; Order Dt/Tm:   02/08/2017 14:37:22 ; Comment:   DO NOT CRUSH            Problem History   (As Of: 03/20/2017 16:56:47 EDT)   Problems(Active)    Aortic valve stenosis (SNOMED CT  :899366986 )  Name of Problem:   Aortic valve stenosis ; Recorder:   TURNER, RN, LYNN R; Confirmation:   Confirmed ; Classification:   Patient Stated ; Code:   899366986 ; Contributor System:   PowerChart ; Last Updated:   03/15/2017 9:47 EDT ; Life Cycle Date:   02/08/2017 ; Life Cycle Status:   Active ; Vocabulary:   SNOMED CT   ; Comments:        03/15/2017 9:47 - Velma, RN, Dickey FALCON  pt states was told moderatestenosis per dr loni. Does not cause any  symptoms at this time    03/15/2017 9:59 - Velma, RN, Dickey FALCON  pt said he had no problems with anesthesia that lasted about the same length of time as the surgery in Apr'18 and Dr loni had already cleared him for the surgery in Apr'18-notes in [facility].      BPH (benign prostatic hyperplasia) (SNOMED CT  :603268987 )  Name of Problem:   BPH (benign prostatic hyperplasia) ; Recorder:   TURNER, RN, LYNN R; Confirmation:   Confirmed ; Classification:   Patient Stated ; Code:   [id number] ; Contributor System:   PowerChart ; Last Updated:   02/08/2017 14:41 EDT ; Life Cycle Date:   02/08/2017 ; Life Cycle Status:   Active ; Vocabulary:   SNOMED CT        Dental crowns status (SNOMED CT  :[id number]-[id number] )  Name of Problem:   Dental crowns status ; Recorder:   TURNER, RN, LYNN R; Confirmation:   Confirmed ; Classification:   Patient Stated ; Code:   [id number] ; Contributor System:   PowerChart ; Last Updated:   02/08/2017 14:42 EDT ; Life Cycle Date:   02/08/2017 ; Life Cycle Status:   Active ; Vocabulary:   SNOMED CT        H/O: HTN (hypertension) (SNOMED CT  :748325985 )  Name of Problem:   H/O: HTN (hypertension) ; Recorder:   TURNER,  RN, LYNN R; Confirmation:   Confirmed ; Classification:   Patient Stated ; Code:   748325985 ; Contributor System:   PowerChart ; Last Updated:   02/08/2017 14:40 EDT ; Life Cycle Date:   02/08/2017 ; Life Cycle Status:   Active ; Vocabulary:   SNOMED CT        High cholesterol (SNOMED CT  :76716984 )  Name of Problem:   High cholesterol ; Recorder:   TURNER, RN, LYNN R; Confirmation:   Confirmed ; Classification:   Patient Stated ; Code:   76716984 ; Contributor System:   PowerChart ; Last Updated:   02/08/2017 14:39 EDT ; Life Cycle Date:   02/08/2017 ; Life Cycle Status:   Active ; Vocabulary:   SNOMED CT        Hypothyroid (SNOMED CT  :31731988 )  Name of Problem:   Hypothyroid ; Recorder:   TURNER, RN, LYNN R; Confirmation:   Confirmed ;  Classification:   Patient Stated ; Code:   31731988 ; Contributor System:   PowerChart ; Last Updated:   02/08/2017 14:39 EDT ; Life Cycle Date:   02/08/2017 ; Life Cycle Status:   Active ; Vocabulary:   SNOMED CT        Tongue cancer (SNOMED CT  :517452989 )  Name of Problem:   Tongue cancer ; Onset Date:   02/04/2017 ; Recorder:   Velma RN, Dickey FALCON; Confirmation:   Confirmed ; Classification:   Patient Stated ; Code:   [id number] ; Contributor System:   PowerChart ; Last Updated:   03/15/2017 9:46 EDT ; Life Cycle Date:   03/15/2017 ; Life Cycle Status:   Active ; Vocabulary:   SNOMED CT   ; Comments:        03/15/2017 9:46 - Velma, RN, Dickey FALCON  had first surgery in Apr'18-now for Left Cervical Lymphadenectomy on 03/20/17        Diagnoses(Active)    Encounter for preprocedural laboratory examination  Date:   03/15/2017 ; Confirmation:   Confirmed ; Clinical Dx:   Encounter for preprocedural laboratory examination ; Classification:   Medical ; Clinical Service:   Non-Specified ; Code:   ICD-10-CM ; Probability:   0 ; Diagnosis Code:   S98.187      Malignant neoplasm of tongue  Date:   03/14/2017 ; Diagnosis Type:   Discharge ; Confirmation:   Confirmed ; Clinical Dx:   Malignant neoplasm of tongue ; Classification:   Medical ; Clinical Service:   Non-Specified ; Code:   ICD-10-CM ; Probability:   0 ; Diagnosis Code:   C02.9      Tongue lesion  Date:   03/14/2017 ; Diagnosis Type:   Discharge ; Confirmation:   Confirmed ; Clinical Dx:   Tongue lesion ; Classification:   Medical ; Clinical Service:   Non-Specified ; Code:   ICD-10-CM ; Probability:   0 ; Diagnosis Code:   K14.8      Tongue ulcer  Date:   03/14/2017 ; Diagnosis Type:   Discharge ; Confirmation:   Confirmed ; Clinical Dx:   Tongue ulcer ; Classification:   Medical ; Clinical Service:   Non-Specified ; Code:   ICD-10-CM ; Probability:   0 ; Diagnosis Code:   K14.0        Procedure History        -    Procedure History   (As Of: 03/20/2017 16:56:47 EDT)     Procedure  Dt/Tm:  02/13/2017 12:57:00 EDT ; Location:   [facility] ; Provider:   DONALYNN LOVING; Anesthesia Type:   General ; Anesthesia Minutes:   0 ; Procedure Name:   Glossectomy (Partial) ; Procedure Minutes:   27 ; Comments:     02/13/2017 14:32 - Hayward, RN, Aloha  auto-populated from documented surgical case ; Clinical Service:   Surgery ; Last Reviewed Dt/Tm:   03/20/2017 16:55:29 EDT            Procedure Dt/Tm:   02/13/2017 12:57:00 EDT ; Location:   [facility] ; Provider:   DONALYNN LOVING; Anesthesia Type:   General ; Anesthesia Minutes:   0 ; Procedure Name:   Skin Graft Split Thickness ; Procedure Minutes:   83 ; Comments:     02/13/2017 14:32 - [place]ward, RN, Aloha  auto-populated from documented surgical case ; Clinical Service:   Surgery ; Last Reviewed Dt/Tm:   03/20/2017 16:55:29 EDT            Anesthesia Minutes:   0 ; Procedure Name:   Wisdom tooth ; Procedure Minutes:   0 ; Last Reviewed Dt/Tm:   03/20/2017 16:55:29 EDT            Anesthesia Minutes:   0 ; Procedure Name:   Colonoscopy ; Procedure Minutes:   0 ; Last Reviewed Dt/Tm:   03/20/2017 16:55:29 EDT            Procedure Dt/Tm:   03/20/2017 13:08:00 EDT ; Location:   [facility] ; Provider:   DONALYNN LOVING; Anesthesia Type:   General ; :   SLATTERY-MD,  STEPHEN O; Anesthesia Minutes:   0 ; Procedure Name:   Neck Dissection (Left, Cervical) ; Procedure Minutes:   136 ; Comments:     03/20/2017 15:37 - Marty, RN, Lucie CROME  auto-populated from documented surgical case ; Clinical Service:   Surgery ; Last Reviewed Dt/Tm:   03/20/2017 16:55:29 EDT            Anesthesia Minutes:   0 ; Procedure Name:   Dupuytren contracture of right palm ; Procedure Minutes:   0 ; Last Reviewed Dt/Tm:   03/20/2017 16:55:29 EDT            Immunizations   Influenza Vaccine Status :   Received prior to admission, during current flu season   Last Tetanus :   None received   HUTCHERSON,  MEGAN T - 03/20/2017 16:55 EDT   ID Risk Screen   Chills :   No   Cough (Any Duration) :   No    Fever :   No   Hemoptysis (Blood in Sputum) :   No   Night Sweats :   No   Weight Loss Greater Than 10 Pounds :   No   Hx of TB Now or at Any Time In the Past (Even if on Meds) :   No   Foreign-Born :   No   Homeless or In Shelter :   No   Incarcerated Within Last 2 Years :   No   Intravenous Drug User :   No   Male Homosexual :   No   New TST/IGRA Results Pos(Within 2 yrs),Hx Recent TB Exposure :   No   HUTCHERSON,  MEGAN T - 03/20/2017 16:55 EDT   3 or more loose/watery stools :   No   MRSA/VRE Screening :   None of these apply   Patient Recent  Travel History :   No recent travel   Family Member Travel History :   No recent travel   [place],  [place] T - 03/20/2017 16:55 EDT   Infectious Disease Risk Factor Grid   Chills :   No   Fever :   No   Fatigue :   No   Headache :   No   Runny or Stuffy Nose :   No   Sore Throat :   No   Difficulty Breathing :   No   Shortness of Breath :   No   New or Worsening Cough :   No   Wheezing :   No   Vomiting :   No   Diarrhea :   No   Abdominal (Stomach Pain) :   No   Muscle Pain :   No   Weakness/Numbness :   No   Recent Exposure to Communicable Disease :   No   Illness With Generalized Rash :   No   Abnormal Bleeding :   No   Unexplained Hemorrhage (Bleeding or Bruising) :   No   Arthralgia :   No   Conjunctivitis :   No   HUTCHERSON,  MEGAN T - 03/20/2017 16:55 EDT   Bloodless Medicine   Will Patient Accept Blood Transfusion and/or Blood Products :   Yes   HUTCHERSON,  MEGAN T - 03/20/2017 16:55 EDT   Nutrition   Nutritional Risk Factors :   None   Unintentional Weight Change Greater Than 10 lbs in the Last 6 Months :   No   HUTCHERSON,  MEGAN T - 03/20/2017 16:55 EDT   Functional   Sensory Deficits :   None   ADLs Prior to Admission :   Benard BENARD BOUCHARD T - 03/20/2017 16:55 EDT   Social History   Social History   (As Of: 03/20/2017 16:56:47 EDT)   Tobacco:        Never smoker   (Last Updated: 02/13/2017 11:17:13 EDT by Jearld RN, Olam SAUNDERS)          Alcohol:         Past   Comments:  02/13/2017 11:17 - Jearld, RN, Olam SAUNDERS: NONE IN 10 YEARS   (Last Updated: 02/13/2017 11:17:49 EDT by Jearld, RN, Olam SAUNDERS)          Substance Abuse:        Denies   (Last Updated: 02/08/2017 14:44:20 EDT by SHLOMO, RN, LYNN R)            Spiritual   Faith/Denomination :   [place]le[place]   Do you have a concern that you would like to address with a Chaplain? :   No   Do you have any religious/spiritual/cultural beliefs that could impact the way your care is provided? :   No   HUTCHERSON,  MEGAN T - 03/20/2017 16:55 EDT   Harm Screen   Injuries/Abuse/Neglect in Household :   Denies   Feels Unsafe at Home :   No   Recent Life Events :   None   Family Behavioral Health History :   None   Previous Mental Illness Diagnosis :   None   Suicidal Behavior :   None   Self Harming Behavior :   None   Suicidal Ideation :   None   BENARD BOUCHARD T - 03/20/2017 16:55 EDT   Advance Directive   Advance Directive :  No   Patient Wishes to Receive Further Information on Advance Directives :   No   BENARD BOUCHARD T - 03/20/2017 16:55 EDT   Education   Written Language :   Isadora   Caregiver/Advocate Primary Language :   Isadora   Caregiver/Advocate Written Language :   Isadora   Primary Language :   Isadora BENARD BOUCHARD T - 03/20/2017 16:55 EDT   Caregiver/Advocate Language   Patient :   Demonstration, Printed materials, Verbal explanation   Family :   Demonstration, Printed materials, Verbal explanation   BENARD BOUCHARD T - 03/20/2017 16:55 EDT   Barriers to Learning :   None evident   Teaching Method :   Explanation   BENARD BOUCHARD T - 03/20/2017 16:55 EDT   DCP GENERIC CODE   Unit/Room Orientation :   [place] understanding   Environmental Safety :   [place] understanding   Hand Washing :   Verbalizes understanding   Infection Prevention :   Verbalizes understanding   DVT Prophylaxis :   Verbalizes understanding   Isolation Precaution :   Verbalizes understanding   HUTCHERSON,  MEGAN T - 03/20/2017  16:55 EDT   DC Needs   Living Situation :   Home independently   Anticipated Discharge Needs :   None   BENARD BOUCHARD T - 03/20/2017 16:55 EDT   Valuables and Belongings   Does Patient Have Valuables and Belongings :   Chaney BENARD BOUCHARD T - 03/20/2017 16:55 EDT   DCP GENERIC CODE   Transfer/Discharge :   Clothes, Jewelry, Purse/Wallet, Glasses   HUTCHERSON,  MEGAN T - 03/20/2017 16:55 EDT   Patient Search Completed :   CATHERIN BENARD BOUCHARD T - 03/20/2017 16:55 EDT   Admission Complete   Admission Complete :   Yes   BENARD BOUCHARD T - 03/20/2017 16:55 EDT

## 2017-03-20 NOTE — Nursing Note (Signed)
Medication Administration Follow Up-Text       Medication Administration Follow Up Entered On:  03/20/2017 23:57 EDT    Performed On:  03/20/2017 23:56 EDT by CHUBB, RN, ELIZABETH K      Intervention Information:     acetaminophen-hydrocodone  Performed by CHUBB, RN, ELIZABETH K on 03/20/2017 23:16:00 EDT       HYDROcodone-acetaminophen,50mL  Oral,moderate pain (4-7)       Med Response   ED Medication Response :   No adverse reaction, Symptoms improved   Numeric Rating Pain Scale :   1   Pasero Opioid Induced Sedation Scale :   2 = Slightly drowsy, easily aroused   Respiratory Rate :   18 br/min   Pain Description Section :   Document assessment   CHUBB, RN, ELIZABETH K - 03/20/2017 23:56 EDT   Pain Description   Laterality :   Left   Pain Location :   Neck   Quality :   Dull   CHUBB, RN, ELIZABETH K - 03/20/2017 23:56 EDT

## 2017-03-20 NOTE — Nursing Note (Signed)
Medication Administration Follow Up-Text       Medication Administration Follow Up Entered On:  03/20/2017 16:01 EDT    Performed On:  03/20/2017 15:59 EDT by Myer HaffYARBROUGH, RN, DONNA C      Intervention Information:     ondansetron  Performed by Myer HaffYARBROUGH, RN, DONNA C on 03/20/2017 16:00:00 EDT       ondansetron,4mg   IV Push,Forearm, Mid Right,nausea       Med Response   ED Medication Response :   Continue to observe for symptoms   Myer HaffYARBROUGH, RN, DONNA C - 03/20/2017 16:01 EDT

## 2017-03-20 NOTE — Nursing Note (Signed)
Medication Administration Follow Up-Text       Medication Administration Follow Up Entered On:  03/20/2017 22:21 EDT    Performed On:  03/20/2017 22:05 EDT by CHUBB, RN, ELIZABETH K      Intervention Information:     morphine  Performed by CHUBB, RN, ELIZABETH K on 03/20/2017 21:46:00 EDT       morphine,4mg   IV Push,Forearm, Mid Right,severe pain (8-10)       Med Response   ED Medication Response :   No adverse reaction, Symptoms improved   Numeric Rating Pain Scale :   2   Pasero Opioid Induced Sedation Scale :   1 = Awake and alert   Respiratory Rate :   17 br/min   Pain Description Section :   Document assessment   CHUBB, RN, ELIZABETH K - 03/20/2017 22:20 EDT   Pain Description   Laterality :   Left   Pain Location :   Neck   Quality :   Aching   CHUBB, RN, ELIZABETH K - 03/20/2017 22:20 EDT

## 2017-03-20 NOTE — Nursing Note (Signed)
Medication Administration Follow Up-Text       Medication Administration Follow Up Entered On:  03/20/2017 16:01 EDT    Performed On:  03/20/2017 15:54 EDT by Myer HaffYARBROUGH, RN, DONNA C      Intervention Information:     morphine  Performed by Myer HaffYARBROUGH, RN, DONNA C on 03/20/2017 15:54:00 EDT       morphine,4mg   IV Push,Forearm, Mid Right,other (see comment)       Med Response   ED Medication Response :   Continue to observe for symptoms   Numeric Rating Pain Scale :   7   Pasero Opioid Induced Sedation Scale :   2 = Slightly drowsy, easily aroused   Pain Description Section :   Document assessment   Myer HaffYARBROUGH, RN, DONNA C - 03/20/2017 16:00 EDT   Pain Description   Time Pattern :   Constant   Pain Location :   Neck   Quality :   Mignon PineAching   YARBROUGH, RN, DONNA C - 03/20/2017 16:00 EDT

## 2017-03-21 NOTE — Discharge Summary (Signed)
 Inpatient Patient Summary               [facility]  [address]  [place], [place] [address]  [phone]  Patient Discharge Instructions     Name: Ruben [name], Ruben [name]  Current Date: 03/21/2017 13:34:21  DOB: [birth year] MRN: [id number] FIN: [id number]  Patient Address: [address] [place] [address]  Patient Phone: [phone]  Primary Care Provider:  Name: DENNARD MERILEE CROME  Phone: [phone]   Immunizations Provided:       Discharge Diagnosis: Malignant neoplasm of tongue; Tongue lesion; Tongue ulcer  Discharged To: TO, ANTICIPATED%>  Home Treatments: TREATMENTS, ANTICIPATED%>  Devices/Equipment: EQUIPMENT REHAB%>  Post Hospital Services: HOSPITAL SERVICES%>  Professional Skilled Services: SKILLED SERVICES%>  Special Services and Community Resources:               SERV AND COMM RES, ANTICIPATED%>  Mode of Discharge Transportation: TRANSPORTATION%>  Discharge Orders         Discharge Patient 03/21/17 13:28:00 EDT, Discharge Home/Self Care         Comment:      Medications   During the course of your visit, your medication list was updated with the most current information. The details of those changes are reflected below:         Medications that have not changed  Other Medications  ibuprofen 600 Milligram Oral (given by mouth) 4 times a day as needed mild pain (1-3).  Last Dose:____________________  levothyroxine 200 Microgram Oral (given by mouth) once a day (in the morning).  Last Dose:____________________  silodosin (Rapaflo 8 mg oral capsule) 1 Capsules Oral (given by mouth) once a day (in the evening).  Last Dose:____________________  simvastatin (Zocor) 40 Milligram Oral (given by mouth) Once a Day (at bedtime).  Last Dose:____________________  venlafaxine (venlafaxine 75 mg oral capsule, extended release) 1 Capsules Oral (given by mouth) once a day (in the evening)., DO NOT CRUSH  Last Dose:____________________         [facility] would like to thank you for  allowing us  to assist you with your healthcare needs. The following includes patient education materials and information regarding your injury/illness.     Morrow, Toshiro K has been given the following list of follow-up instructions, prescriptions, and patient education materials:  Follow-up Instructions:             It is important to always keep an active list of medications available so that you can share with other providers and manage your medications appropriately. As an additional courtesy, we are also providing you with your final active medications list that you can keep with you.           ibuprofen 600 Milligram Oral (given by mouth) 4 times a day as needed mild pain (1-3).  levothyroxine 200 Microgram Oral (given by mouth) once a day (in the morning).  silodosin (Rapaflo 8 mg oral capsule) 1 Capsules Oral (given by mouth) once a day (in the evening).  simvastatin (Zocor) 40 Milligram Oral (given by mouth) Once a Day (at bedtime).  venlafaxine (venlafaxine 75 mg oral capsule, extended release) 1 Capsules Oral (given by mouth) once a day (in the evening)., DO NOT CRUSH      Take only the medications listed above. Contact your doctor prior to taking any medications not on this list.        Discharge instructions, if any, will display below     Instructions for Diet:  INSTRUCTIONS FOR DIET%>   Instructions for Supplements: SUPPLEMENT INSTRUCTIONS%>   Instructions for Activity: INSTRUCTIONS FOR ACTIVITY%>   Instructions for Wound Care: INSTRUCTIONS FOR WOUND CARE%>     Medication leaflets, if any, will display below     Patient education materials, if any, will display below           IS IT A STROKE? Act FAST and Check for these signs:    FACE                         Does the face look uneven?    ARM                         Does one arm drift down?    SPEECH                    Does their speech sound strange?    TIME                         Call 9-1-1 at any sign of  stroke  ---------------------------------------------------------------------------------------------------------------------------  Heart Attack Signs  Chest discomfort: Most heart attacks involve discomfort in the center of the chest and lasts more than a few minutes, or goes away and comes back. It can feel like uncomfortable pressure, squeezing, fullness or pain.  Discomfort in upper body: Symptoms can include pain or discomfort in one or both arms, back, neck, jaw or stomach.  Shortness of breath: With or without discomfort.  Other signs: Breaking out in a cold sweat, nausea, or lightheaded.  Remember, MINUTES DO MATTER. If you experience any of these heart attack warning signs, call 9-1-1 to get immediate medical attention!     ---------------------------------------------------------------------------------------------------------------------------  [facility] allows you to manage your health, view your test results, and retrieve your discharge documents from your hospital stay securely and conveniently from your computer.  To begin the enrollment process, visit [email or web address] Click on "Sign up now" under [facility].

## 2017-03-21 NOTE — Discharge Summary (Signed)
 Inpatient Clinical Summary             [facility]  Post-Acute Care Transfer Instructions  PERSON INFORMATION   Name: Ruben [name], Ruben [name]  MRN: [id number]    FIN#: [id number]   PHYSICIANS  Admitting Physician: DONALYNN LOVING  Attending Physician: DONALYNN LOVING   PCP: DENNARD MERILEE CROME  Discharge Diagnosis:  Malignant neoplasm of tongue; Tongue lesion; Tongue ulcer  Comment:       PATIENT EDUCATION INFORMATION  Instructions:               Medication Leaflets:               Follow-up:                                   MEDICATION LIST  Medication Reconciliation at Discharge:         Medications that have not changed  Other Medications  ibuprofen 600 Milligram Oral (given by mouth) 4 times a day as needed mild pain (1-3).  Last Dose:____________________  levothyroxine 200 Microgram Oral (given by mouth) once a day (in the morning).  Last Dose:____________________  silodosin (Rapaflo 8 mg oral capsule) 1 Capsules Oral (given by mouth) once a day (in the evening).  Last Dose:____________________  simvastatin (Zocor) 40 Milligram Oral (given by mouth) Once a Day (at bedtime).  Last Dose:____________________  venlafaxine (venlafaxine 75 mg oral capsule, extended release) 1 Capsules Oral (given by mouth) once a day (in the evening)., DO NOT CRUSH  Last Dose:____________________         Patient's Final Home Medication List Upon Discharge:          ibuprofen 600 Milligram Oral (given by mouth) 4 times a day as needed mild pain (1-3).  levothyroxine 200 Microgram Oral (given by mouth) once a day (in the morning).  silodosin (Rapaflo 8 mg oral capsule) 1 Capsules Oral (given by mouth) once a day (in the evening).  simvastatin (Zocor) 40 Milligram Oral (given by mouth) Once a Day (at bedtime).  venlafaxine (venlafaxine 75 mg oral capsule, extended release) 1 Capsules Oral (given by mouth) once a day (in the evening)., DO NOT CRUSH         Comment:       ORDERS         Order Name Order Details   Discharge  Patient 03/21/17 13:28:00 EDT, Discharge Home/Self Care

## 2017-03-21 NOTE — Nursing Note (Signed)
Medication Administration Follow Up-Text       Medication Administration Follow Up Entered On:  03/21/2017 13:24 EDT    Performed On:  03/21/2017 13:24 EDT by Tillie FantasiaHUTCHERSON,  MEGAN T      Intervention Information:     acetaminophen-hydrocodone  Performed by Tillie FantasiaHUTCHERSON,  MEGAN T on 03/21/2017 11:08:00 EDT       HYDROcodone-acetaminophen,5615mL  Oral,moderate pain (4-7)       Med Response   ED Medication Response :   No adverse reaction, Symptoms improved   Numeric Rating Pain Scale :   2   [place]ero Opioid Induced Sedation Scale :   1 = Awake and alert   Tillie FantasiaHUTCHERSON,  MEGAN T - 03/21/2017 13:24 EDT

## 2017-03-26 DIAGNOSIS — J441 Chronic obstructive pulmonary disease with (acute) exacerbation: Secondary | ICD-10-CM | POA: Insufficient documentation

## 2017-03-26 DIAGNOSIS — J449 Chronic obstructive pulmonary disease, unspecified: Secondary | ICD-10-CM | POA: Insufficient documentation

## 2017-03-26 DIAGNOSIS — I10 Essential (primary) hypertension: Secondary | ICD-10-CM | POA: Insufficient documentation

## 2017-03-26 DIAGNOSIS — G40909 Epilepsy, unspecified, not intractable, without status epilepticus: Secondary | ICD-10-CM | POA: Insufficient documentation

## 2017-03-26 DIAGNOSIS — K219 Gastro-esophageal reflux disease without esophagitis: Secondary | ICD-10-CM | POA: Insufficient documentation

## 2017-03-26 DIAGNOSIS — J4489 Other specified chronic obstructive pulmonary disease: Secondary | ICD-10-CM | POA: Insufficient documentation

## 2017-03-26 DIAGNOSIS — E782 Mixed hyperlipidemia: Secondary | ICD-10-CM | POA: Insufficient documentation

## 2017-03-26 DIAGNOSIS — Z8673 Personal history of transient ischemic attack (TIA), and cerebral infarction without residual deficits: Secondary | ICD-10-CM | POA: Insufficient documentation

## 2017-03-26 DIAGNOSIS — F411 Generalized anxiety disorder: Secondary | ICD-10-CM | POA: Insufficient documentation

## 2017-04-04 NOTE — Consults (Signed)
Consultation                 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  The patient has undergone excision.  Tumor is HPV negative.   He is here to see whether he needs adjuvant postop radiation.    HISTORY OF PRESENT ILLNESS:  A 66 year old white gentleman who had a  history of a left-sided tongue ulcer.  He had never smoked.  He was  seen by his primary care physician and antivirals were recommended.   The patient wanted to see if it would heal on its own, so he did not  take them.  When it did not heal on its own, he was sent to ear, nose,  and throat, Dr. Charlsie QuestBrand.      On exam, Dr. Charlsie QuestBrand noted a 1 cm to 1.5 cm  raised erythematous lesion on the left posterior tongue.  It was not  ulcerated.  It was mildly tender to palpation.  The rest of the exam  including the neck was negative.  Steroid cream was tried on the  lesion.      When it did not go away, the patient was sent to Dr. Mylo RedBehrens  who performed a left partial glossectomy with split thickness skin  graft as an excisional biopsy on 02/13/2017.  This showed  well-differentiated squamous cell cancer 1 cm in size.  It was 4 mm  deep and a 1.3 mm margin.  No lymphovascular embolization was seen.   No perineural invasion was seen.  P16 testing was negative.  PET CT  was done postoperatively on 02/23/2017.  This was positive along the  left tongue incision with an SUV of 10.3.  There are also a few  left-sided level 2 lymph nodes that were normal in size with a  borderline SUV of 3.3.  Everything else was negative.      On 03/20/2017,  Dr. Mylo RedBehrens did a level 2 and level 3 neck dissection.  All the level  2 nodes were negative.  Five level 3 nodes were taken and 1 of them  had a 1-2 mm metastatic deposit.  Therefore, the patient is a final  stage of pT1pN1.     Currently, he feels relatively well.  His speech  has  recovered.  His tongue is still a little numb in the area of the  excision, but he is slowly learning how to eat with that numbness.   Overall, he feels well.    REVIEW OF SYSTEMS:  VISION:  He wears reading glasses and had a LASIK  procedure in the past.  GASTROINTESTINAL:  He has been on a diet and  intentionally lost 7-10 pounds by eating more fruits and vegetables.   Rest of review of systems including eyes, ears, nose, throat,  cardiovascular, pulmonary, gastrointestinal, genitourinary,  musculoskeletal, integumentary, neurologic, psychiatric, and  orthopedic are negative.    PAST MEDICAL HISTORY:  Aortic stenosis which is stable.  He is seeing  Dr. Kathaleen Grindericcone who follows him for that.  Hypothyroidism.   Hyperlipidemia.  Also a history of BPH.  He has a recurrent Dupuytren  contracture after surgery.  No previous radiation.    MEDICATIONS:  Crestor, levothyroxine, Rapaflo, venlafaxine, and  ibuprofen.    ALLERGIES:  None.    SOCIAL HISTORY:  He used to  smoke an occasional cigar but has quit  that since the diagnosis.  Drank heavily in the past but has had no  alcohol for the last 10 years.  He is a Scientist, research (physical sciences), married,  and comes in today with his wife who is in good health.    PHYSICAL EXAMINATION:  Today, the patient is a well-appearing  gentleman, 72 inches tall, 205 pounds, blood pressure 152/90, pulse 72  and regular.  NECK:  Supple, no adenopathy.  His incision is  well-healed.  There is no palpable adenopathy.  Oral cavity shows  teeth to be in good condition.  I can see the surgical bed.  There is  no evidence of tumor.  It is nontender on palpation.  There is minimal  induration.  The right neck is negative, and the rest of the oral  cavity is negative.  No pain to percussion over the spine.  LUNGS:   Clear to auscultation.  AXILLAE:  No nodes.  ABDOMEN:  Soft,  nontender, no hepatosplenomegaly.    ASSESSMENT:    1.  The patient has a cancer of the left oral tongue pathologic Stage  pT1pN1.   This is moderately differentiated.  The tumor was 4 mm thick.   No perineural invasion, no lymphovascular embolization.  The N1  disease was only a 1-2 mm metastatic deposit.  Taking all the above  into account and reading through the up-to-date guidelines, the  patient's recurrence risk for local regional recurrence is felt to be  relatively low because of the negative margins and the lack of poor  prognostic indicators.  Also, his lesion is only 4 mm deep.  The  up-to-date guidelines also conclude from the available scientific data  that radiation has not been proven to improve overall survival,  although it may cut the local regional recurrence rate.  Discussed all  the above with the patient including the risks, benefits, and side  effects of radiation.  2.  Aortic stenosis.  3.  Hypothyroidism.  4.  Hyperlipidemia.  5.  Dupuytren contracture.    6.  Benign prostatic hypertrophy.    PLAN:  I went through the risks, benefits, and side effects of  radiation in detail with the patient and his wife.  I told them that I  felt the benefit was small but the long-term risks and morbidity were  great.  My overall conclusion from the available data would be to  observe him.  However, I said that if he felt strongly that he wanted  radiation and wanted everything done, we could accommodate that.  At  this point, he and his wife are very concerned about long-term side  effects from the radiation and feel that a somewhat increased but  still relatively small risk of local regional recurrence is worth  taking.  They will be followed closely by Dr. Mylo Red and Dr. Charlsie Quest;  that is necessary anyway as they are at increased risk of other oral  cancers because of this diagnosis.  We gave them my card.  They will  call me if I can be of assistance in the future.    QUALITY METRICS:  102:  He does not have prostate cancer.  110:  He  had his flu shot in 2018.  130:  Medications are documented in the  chart.  143:  He is not in pain.   144:  He is not in pain.  154:  He  is ambulatory and not at  risk of falls.  156:  He is not going to  undergo radiation but if he did, radiation dose limits to normal  structures will be documented in the dosimetry plan.  226:  He did  smoke cigars prior to diagnosis, but he has quit.      Charise Killian, MD  TR: *n DD: 04/04/2017 15:14 TD: 04/04/2017 16:00 Job#: [id number]  \\[id number]\\DOC#: [id number]  \\[id number]\\      cc:  Rolm Gala MD       [name] DO       Heron Sabins, III MD    Signature Line     Electronically Signed on 04/05/2017 08:31 AM EDT   ________________________________________________   Nash Mantis               Modified by: Nash Mantis on 04/05/2017 08:31 AM EDT

## 2017-04-05 ENCOUNTER — Encounter (HOSPITAL_COMMUNITY): Payer: Self-pay

## 2017-04-05 ENCOUNTER — Emergency Department (HOSPITAL_COMMUNITY)
Admission: EM | Admit: 2017-04-05 | Discharge: 2017-04-05 | Disposition: A | Discharge status: Home / Self Care | Payer: Medicare PPO | Attending: Emergency Medicine | Admitting: Emergency Medicine

## 2017-04-05 DIAGNOSIS — G459 Transient cerebral ischemic attack, unspecified: Secondary | ICD-10-CM | POA: Diagnosis not present

## 2017-04-05 DIAGNOSIS — M79672 Pain in left foot: Secondary | ICD-10-CM | POA: Diagnosis present

## 2017-04-05 DIAGNOSIS — M109 Gout, unspecified: Secondary | ICD-10-CM

## 2017-04-05 DIAGNOSIS — Z87891 Personal history of nicotine dependence: Secondary | ICD-10-CM | POA: Diagnosis not present

## 2017-04-05 DIAGNOSIS — J449 Chronic obstructive pulmonary disease, unspecified: Secondary | ICD-10-CM | POA: Insufficient documentation

## 2017-04-05 DIAGNOSIS — Z7951 Long term (current) use of inhaled steroids: Secondary | ICD-10-CM | POA: Diagnosis not present

## 2017-04-05 DIAGNOSIS — M10072 Idiopathic gout, left ankle and foot: Secondary | ICD-10-CM | POA: Diagnosis not present

## 2017-04-05 DIAGNOSIS — Z7982 Long term (current) use of aspirin: Secondary | ICD-10-CM | POA: Diagnosis not present

## 2017-04-05 MED ORDER — INDOMETHACIN 25 MG PO CAPS: ORAL_CAPSULE | ORAL | 0 refills | Status: DC

## 2017-04-05 MED ORDER — HYDROCODONE-ACETAMINOPHEN 5-325 MG PO TABS
1.0000 | ORAL_TABLET | Freq: Once | ORAL | Status: AC
  Administered 2017-04-05: 1 via ORAL
  Filled 2017-04-05: qty 1

## 2017-04-05 MED ORDER — HYDROCODONE-ACETAMINOPHEN 5-325 MG PO TABS: 1.0000 | ORAL_TABLET | Freq: Four times a day (QID) | ORAL | 0 refills | Status: DC | PRN

## 2017-04-05 MED ORDER — INDOMETHACIN 25 MG PO CAPS
50.0000 mg | ORAL_CAPSULE | Freq: Once | ORAL | Status: AC
  Administered 2017-04-05: 50 mg via ORAL
  Filled 2017-04-05 (×2): qty 2

## 2017-04-05 NOTE — ED Provider Notes (Signed)
Medical screening examination/treatment/procedure(s) were conducted as a shared visit with non-physician practitioner(s) and myself.  I personally evaluated the patient during the encounter. Briefly, the patient is a 66 y.o. male presents with left 1st MTP pain consistent with gout flare. Treat with indomethacin and Norco. Pt already has PCP follow for 6/5. The patient is safe for discharge with strict return precautions. Fatima Blank, MD 04/05/17 1140

## 2017-04-05 NOTE — ED Provider Notes (Signed)
[facility] DEPT Provider Note   CSN: [id number] Arrival date & time: 04/05/17  1324  By signing my name below, I, Dora Sims, attest that this documentation has been prepared under the direction and in the presence of [facility], PA-C. Electronically Signed: Dora Sims, Scribe. 04/05/2017. 11:04 AM.  History   Chief Complaint Chief Complaint  Patient presents with  . Foot Pain   The history is provided by the patient. No language interpreter was used.    HPI Comments: Bruce Sullivan is a 66 y.o. male with PMHx including COPD who presents to the Emergency Department complaining of constant, gradually worsening, 10/10 pain to the first metatarsal on the left beginning three days ago upon waking. He reports associated swelling, erythema, and warmth. Patient endorses pain exacerbation with applied pressure to his left first metatarsal and has not ambulated today secondary to pain. He has tried ibuprofen and gabapentin without improvement of his pain. No injuries or trauma to his left foot. Patient experienced a similar episode three months ago and was evaluated here; he was diagnosed with acute gout of his left foot and was discharged with Vicodin and Indocin which he took with alleviation of his pain. He has no h/o gout-related pain prior to three months ago. No FMHx of gout.  No h/o DM. He is a non-smoker and does not drink alcohol. Patient denies dysuria, difficulty urinating, fevers, chills, or any other associated symptoms. He has a follow-up appointment scheduled with his PCP for June 5th.  Past Medical History:  Diagnosis Date  . COPD (chronic obstructive pulmonary disease) ([place])   . Thyroid disease   . TIA (transient ischemic attack)     There are no active problems to display for this patient.   Past Surgical History:  Procedure Laterality Date  . HERNIA REPAIR    . t monitor         Home Medications    Prior to Admission medications   Medication Sig Start Date  End Date Taking? Authorizing Provider  amLODipine (NORVASC) 5 MG tablet Take 5 mg by mouth every morning.    [provider]  aspirin EC 81 MG tablet Take 81 mg by mouth every morning.    [provider]  budesonide-formoterol (SYMBICORT) 80-4.5 MCG/ACT inhaler Inhale 2 puffs into the lungs 2 (two) times daily.    [provider]  doxycycline (VIBRAMYCIN) 100 MG capsule Take 1 capsule (100 mg total) by mouth 2 (two) times daily. 09/03/16   Tanna Furry, MD  HYDROcodone-acetaminophen (NORCO/VICODIN) 5-325 MG tablet Take 1 tablet by mouth every 6 (six) hours as needed. 04/05/17   Ward, Ozella Almond, PA-C  ibuprofen (ADVIL,MOTRIN) 800 MG tablet Take 800 mg by mouth every 8 (eight) hours as needed for moderate pain.    [provider]  indomethacin (INDOCIN) 25 MG capsule Take 1-2 capsules (25-50 mg total) by mouth 3 (three) times daily with meals. During gout attack, 04/05/17   Ward, Ozella Almond, PA-C  levETIRAcetam (KEPPRA) 500 MG tablet Take 250 mg by mouth 2 (two) times daily.    [provider]    Family History History reviewed. No pertinent family history.  Social History Social History  Substance Use Topics  . Smoking status: Former Research scientist (life sciences)  . Smokeless tobacco: Never Used  . Alcohol use No     Allergies   Patient has no known allergies.   Review of Systems Review of Systems  Constitutional: Negative for chills and fever.  Respiratory: Positive for  cough (baseline) and shortness of breath (baseline).   Genitourinary: Negative for difficulty urinating and dysuria.  Musculoskeletal: Positive for arthralgias and joint swelling.  Skin: Positive for color change. Negative for wound.  All other systems reviewed and are negative.  Physical Exam Updated Vital Signs BP 133/84 (BP Location: Left Arm)   Pulse 91   Temp 98.7 F (37.1 C) (Oral)   Resp 20   Ht 5\' 10"  (1.778 m)   Wt 81.6 kg (180 lb)   SpO2 99%   BMI 25.83 kg/m    Physical Exam  Constitutional: He is oriented to person, place, and time. He appears well-developed and well-nourished. No distress.  HENT:  Head: Normocephalic and atraumatic.  Cardiovascular: Normal rate, regular rhythm and normal heart sounds.  Exam reveals no gallop and no friction rub.   No murmur heard. Pulmonary/Chest: Effort normal and breath sounds normal. No respiratory distress.  Lungs clear to auscultation in all four fields. No wheezes, crackles, or rhonchi.  Abdominal: Soft. He exhibits no distension. There is no tenderness.  Musculoskeletal: He exhibits tenderness.  Left first MTP: Erythematous, swollen, warm and tender to the touch. Patient is unable to bear weight and has limited ROM 2/2 pain.  Neurological: He is alert and oriented to person, place, and time.  Skin: Skin is warm and dry.  Nursing note and vitals reviewed.  ED Treatments / Results  Labs (all labs ordered are listed, but only abnormal results are displayed) Labs Reviewed - No data to display  EKG  EKG Interpretation None       Radiology No results found.  Procedures Procedures (including critical care time)  DIAGNOSTIC STUDIES: Oxygen Saturation is 97% on RA, normal by my interpretation.    COORDINATION OF CARE: 11:00 AM Discussed treatment plan with pt at bedside and pt agreed to plan.  Medications Ordered in ED Medications  HYDROcodone-acetaminophen (NORCO/VICODIN) 5-325 MG per tablet 1 tablet (not administered)  indomethacin (INDOCIN) capsule 50 mg (not administered)     Initial Impression / Assessment and Plan / ED Course  I have reviewed the triage vital signs and the nursing notes.  Pertinent labs & imaging results that were available during my care of the patient were reviewed by me and considered in my medical decision making (see chart for details).    Bruce Sullivan is a 66 y.o. male who presents to ED for left toe pain c/w gout. Afebrile. Hx of the same which improved  with indomethacin and pain meds. Rx for this provided. Patient has follow up with pcp on June 5th to discuss lab work drawn after last gout flare. Encouraged to keep this scheduled appointment. Return precautions discussed and all questions answered.   Patient seen by and discussed with Dr. Leonette Monarch who agrees with treatment plan.    Final Clinical Impressions(s) / ED Diagnoses   Final diagnoses:  Acute gout involving toe of left foot, unspecified cause    New Prescriptions New Prescriptions   HYDROCODONE-ACETAMINOPHEN (NORCO/VICODIN) 5-325 MG TABLET    Take 1 tablet by mouth every 6 (six) hours as needed.   INDOMETHACIN (INDOCIN) 25 MG CAPSULE    Take 1-2 capsules (25-50 mg total) by mouth 3 (three) times daily with meals. During gout attack,   I personally performed the services described in this documentation, which was scribed in my presence. The recorded information has been reviewed and is accurate.    Ward, Ozella Almond, PA-C 04/05/17 [phone]

## 2017-04-05 NOTE — Discharge Instructions (Signed)
It was my pleasure taking care of you today!   Keep your scheduled appointment with your primary care provider next week.  Indomethacin as directed. Norco only as needed for severe pain - This can make you very drowsy - please do not drink alcohol, operate heavy machinery or drive on this medication.  Return to ER for fevers, new or worsening symptoms, any additional concerns.

## 2017-04-05 NOTE — ED Triage Notes (Signed)
Patient c/o gout left great toe x 2-3 months, but worse past 2-3 days.

## 2017-04-10 DIAGNOSIS — F321 Major depressive disorder, single episode, moderate: Secondary | ICD-10-CM | POA: Insufficient documentation

## 2017-04-10 DIAGNOSIS — N182 Chronic kidney disease, stage 2 (mild): Secondary | ICD-10-CM | POA: Insufficient documentation

## 2017-09-21 ENCOUNTER — Encounter (HOSPITAL_COMMUNITY): Payer: Self-pay

## 2017-09-21 ENCOUNTER — Other Ambulatory Visit: Payer: Self-pay

## 2017-09-21 DIAGNOSIS — H1132 Conjunctival hemorrhage, left eye: Secondary | ICD-10-CM | POA: Insufficient documentation

## 2017-09-21 DIAGNOSIS — Z79899 Other long term (current) drug therapy: Secondary | ICD-10-CM | POA: Insufficient documentation

## 2017-09-21 DIAGNOSIS — H579 Unspecified disorder of eye and adnexa: Secondary | ICD-10-CM | POA: Diagnosis present

## 2017-09-21 DIAGNOSIS — Z87891 Personal history of nicotine dependence: Secondary | ICD-10-CM | POA: Diagnosis not present

## 2017-09-21 DIAGNOSIS — Z8673 Personal history of transient ischemic attack (TIA), and cerebral infarction without residual deficits: Secondary | ICD-10-CM | POA: Diagnosis not present

## 2017-09-21 DIAGNOSIS — J449 Chronic obstructive pulmonary disease, unspecified: Secondary | ICD-10-CM | POA: Insufficient documentation

## 2017-09-21 NOTE — ED Triage Notes (Addendum)
Pt presents to the ED with complaint of "waking up with his eye blood shot and it feeling funny" three days ago. Pt also complains he has had a cough and congestion. Pt has history of 3 strokes and states his eyes crossed a year after a stroke and couldn't see. Pt states came to hospital to be checked for his red eye because told to come whenever had issues with his vision.

## 2017-09-22 ENCOUNTER — Emergency Department (HOSPITAL_COMMUNITY)
Admission: EM | Admit: 2017-09-22 | Discharge: 2017-09-22 | Disposition: A | Discharge status: Home / Self Care | Payer: Medicare PPO | Attending: Emergency Medicine | Admitting: Emergency Medicine

## 2017-09-22 DIAGNOSIS — H1132 Conjunctival hemorrhage, left eye: Secondary | ICD-10-CM

## 2017-09-22 NOTE — Discharge Instructions (Signed)
Please get single vial preservative free artificial tears at the grocery store.  You may use 1 drop per hour.  Make certain they are preservative free.

## 2017-09-22 NOTE — ED Provider Notes (Signed)
[facility] DEPT Provider Note   CSN: [id number] Arrival date & time: 09/21/17  2040     History   Chief Complaint Chief Complaint  Patient presents with  . Eye Problem    HPI Bruce Sullivan is a 66 y.o. male.  Patient presents to the ED with a chief complaint of left eye problem.  He states that he awakened 3 days ago to a red left eye.  He denies any pain or itching.  Denies any discharge.  He denies any vision changes.  He denies any trauma.  He is not anticoagulated.  Denies any other associated symptoms.   The history is provided by the patient. No language interpreter was used.    Past Medical History:  Diagnosis Date  . COPD (chronic obstructive pulmonary disease) ([place])   . Thyroid disease   . TIA (transient ischemic attack)     There are no active problems to display for this patient.   Past Surgical History:  Procedure Laterality Date  . HERNIA REPAIR    . t monitor         Home Medications    Prior to Admission medications   Medication Sig Start Date End Date Taking? Authorizing Provider  amLODipine (NORVASC) 5 MG tablet Take 5 mg by mouth every morning.    [provider]  aspirin EC 81 MG tablet Take 81 mg by mouth every morning.    [provider]  budesonide-formoterol (SYMBICORT) 80-4.5 MCG/ACT inhaler Inhale 2 puffs into the lungs 2 (two) times daily.    [provider]  doxycycline (VIBRAMYCIN) 100 MG capsule Take 1 capsule (100 mg total) by mouth 2 (two) times daily. 09/03/16   Tanna Furry, MD  HYDROcodone-acetaminophen (NORCO/VICODIN) 5-325 MG tablet Take 1 tablet by mouth every 6 (six) hours as needed. 04/05/17   Ward, Ozella Almond, PA-C  ibuprofen (ADVIL,MOTRIN) 800 MG tablet Take 800 mg by mouth every 8 (eight) hours as needed for moderate pain.    [provider]  indomethacin (INDOCIN) 25 MG capsule Take 1-2 capsules (25-50 mg total) by mouth 3 (three) times daily with meals.  During gout attack, 04/05/17   Ward, Ozella Almond, PA-C  levETIRAcetam (KEPPRA) 500 MG tablet Take 250 mg by mouth 2 (two) times daily.    [provider]    Family History History reviewed. No pertinent family history.  Social History Social History   Tobacco Use  . Smoking status: Former Research scientist (life sciences)  . Smokeless tobacco: Never Used  Substance Use Topics  . Alcohol use: No  . Drug use: No     Allergies   Patient has no known allergies.   Review of Systems Review of Systems  All other systems reviewed and are negative.    Physical Exam Updated Vital Signs BP 128/86 (BP Location: Right Arm)   Pulse 78   Temp 98.9 F (37.2 C) (Oral)   Resp 16   Ht 5\' 10"  (1.778 m)   Wt 81.6 kg (180 lb)   SpO2 97%   BMI 25.83 kg/m   Physical Exam  Constitutional: He is oriented to person, place, and time. No distress.  HENT:  Head: Normocephalic and atraumatic.  Eyes: Conjunctivae and EOM are normal. Pupils are equal, round, and reactive to light.  Left medial subconjunctival hematoma, normal EOMs, arcus senilis bilaterally, no discharge  Neck: No tracheal deviation present.  Cardiovascular: Normal rate.  Pulmonary/Chest: Effort normal. No respiratory distress.  Abdominal: Soft.  Musculoskeletal: Normal  range of motion.  Neurological: He is alert and oriented to person, place, and time.  Skin: Skin is warm and dry. He is not diaphoretic.  Psychiatric: Judgment normal.  Nursing note and vitals reviewed.    ED Treatments / Results  Labs (all labs ordered are listed, but only abnormal results are displayed) Labs Reviewed - No data to display  EKG  EKG Interpretation None       Radiology No results found.  Procedures Procedures (including critical care time)  Medications Ordered in ED Medications - No data to display   Initial Impression / Assessment and Plan / ED Course  I have reviewed the triage vital signs and the nursing notes.  Pertinent labs &  imaging results that were available during my care of the patient were reviewed by me and considered in my medical decision making (see chart for details).     Patient with seemingly uncomplicated left subconjunctival hematoma.  No trauma, pain, itching, discharge, fevers, or vision changes.  Discussed with Dr. Tyrone Nine, who agrees with plan for discharge.  Final Clinical Impressions(s) / ED Diagnoses   Final diagnoses:  Subconjunctival hemorrhage of left eye    ED Discharge Orders    None       Blue, [place], PA-C 09/22/17 [place], [place], DO 09/22/17 [phone]

## 2018-07-15 LAB — T4, FREE: T4 Free: 2.45 ng/dL — ABNORMAL HIGH (ref 0.82–1.70)

## 2018-07-15 LAB — TSH: TSH, 3RD GENERATION: <0.005 mcIU/mL — ABNORMAL LOW (ref 0.358–3.740)

## 2018-07-15 LAB — VITAMIN B12: Vitamin B-12: >2000 pg/mL — ABNORMAL HIGH (ref 232–1245)

## 2018-08-06 ENCOUNTER — Other Ambulatory Visit: Payer: Self-pay | Admitting: Physician Assistant

## 2018-08-06 DIAGNOSIS — Z136 Encounter for screening for cardiovascular disorders: Secondary | ICD-10-CM

## 2018-08-06 DIAGNOSIS — Z87891 Personal history of nicotine dependence: Secondary | ICD-10-CM

## 2018-08-16 ENCOUNTER — Ambulatory Visit
Admission: RE | Admit: 2018-08-16 | Discharge: 2018-08-16 | Disposition: A | Discharge status: Home / Self Care | Payer: Medicare PPO | Source: Ambulatory Visit | Attending: Physician Assistant | Admitting: Physician Assistant

## 2018-08-16 DIAGNOSIS — Z136 Encounter for screening for cardiovascular disorders: Secondary | ICD-10-CM

## 2018-08-16 IMAGING — US US ABDOMINAL AORTA SCREENING AAA
1 series · 14 of 18 positions shown · non-contrast
Comparison: None.

CLINICAL DATA: Male between 65-75 years of age with a smoking
history.

EXAM:
US ABDOMINAL AORTA MEDICARE SCREENING
TECHNIQUE: Ultrasound examination of the abdominal aorta was performed as a
screening evaluation for abdominal aortic aneurysm.

[Series 1: us abdominal aorta screening aaa · 0.31mm/px · 14 of 18 slices shown]
[im 1/18]
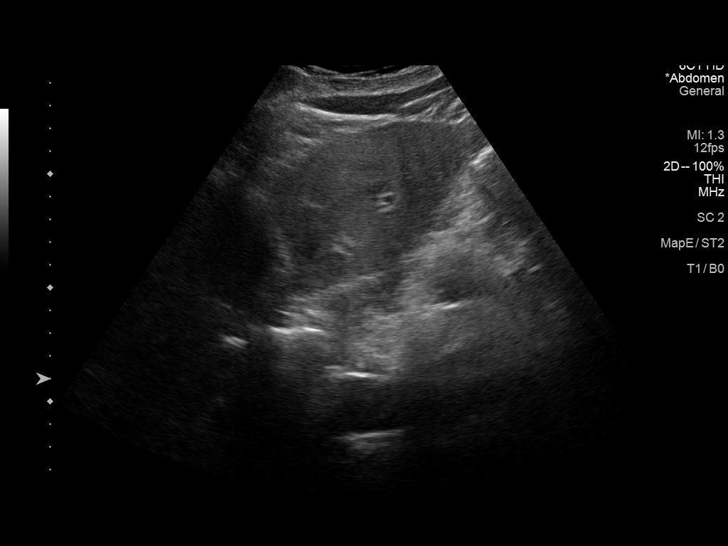
[im 2/18]
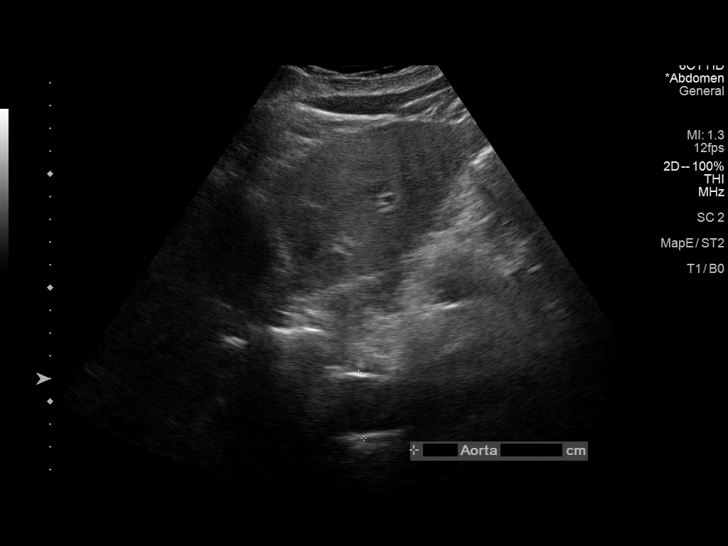
[im 4/18]
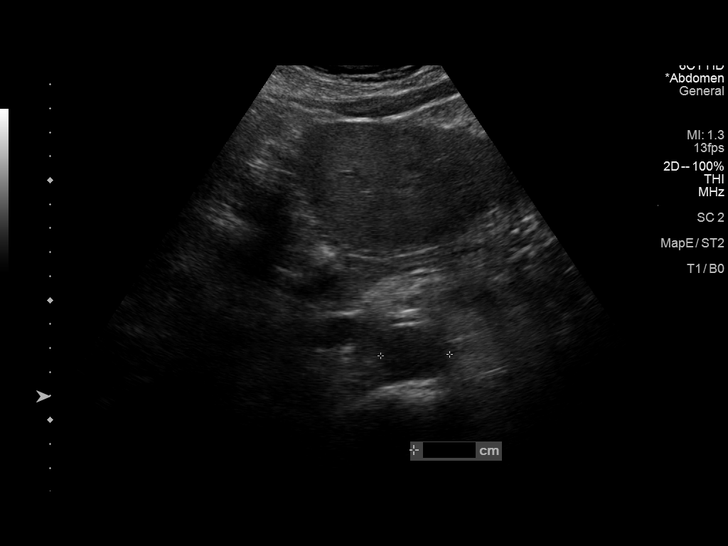
[im 5/18]
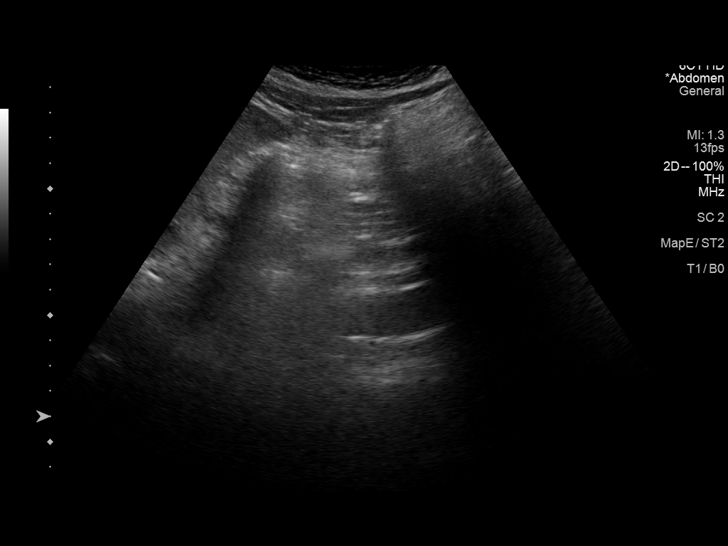
[im 6/18]
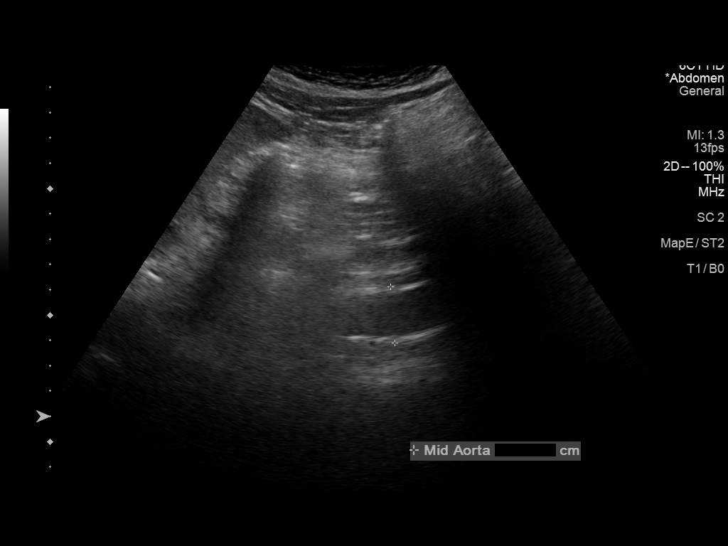
[im 8/18]
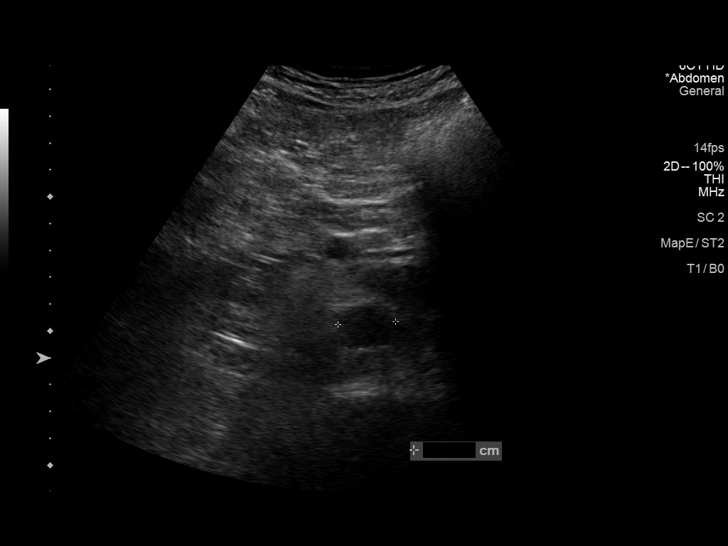
[im 9/18]
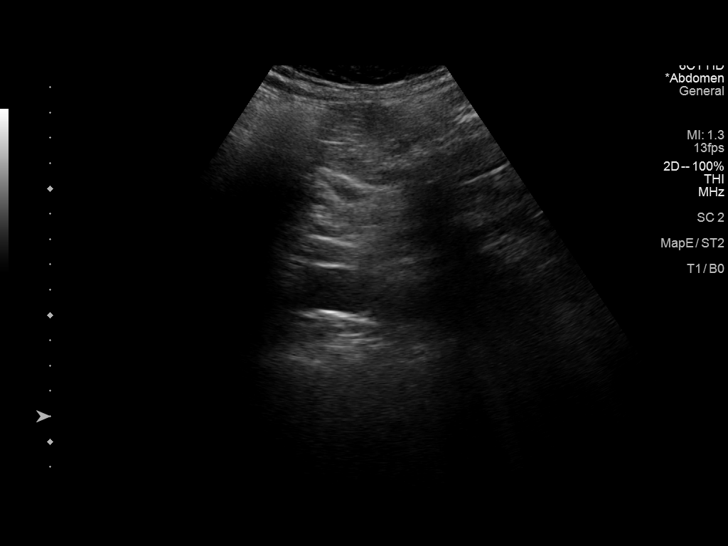
[im 10/18]
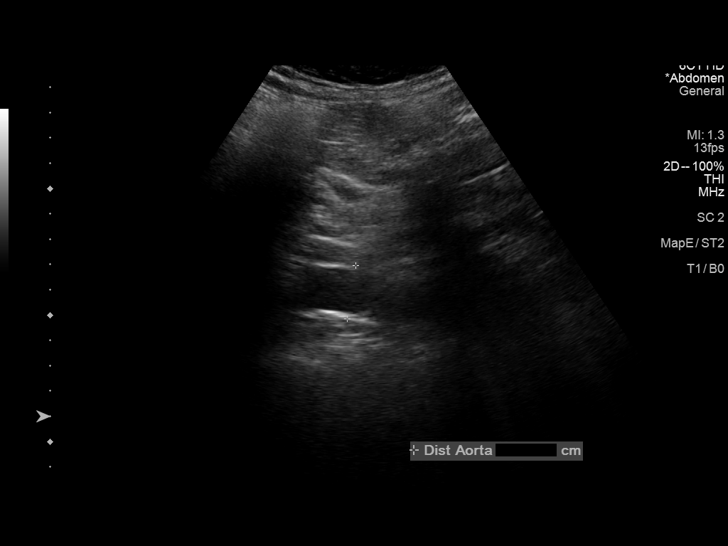
[im 11/18]
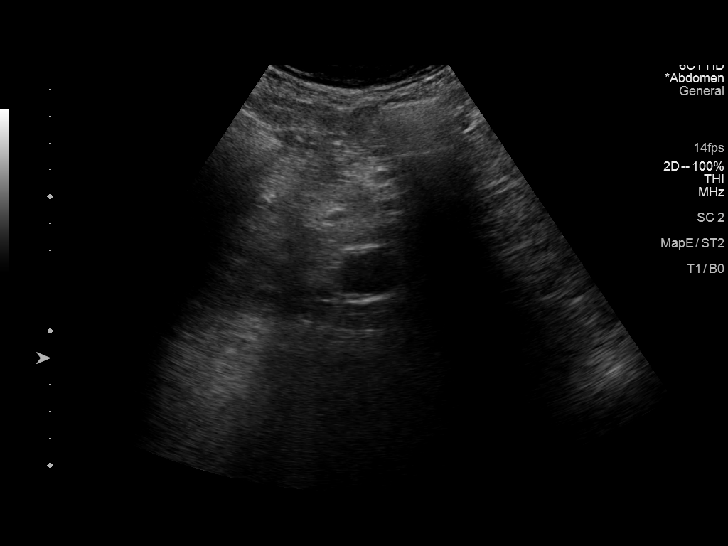
[im 13/18]
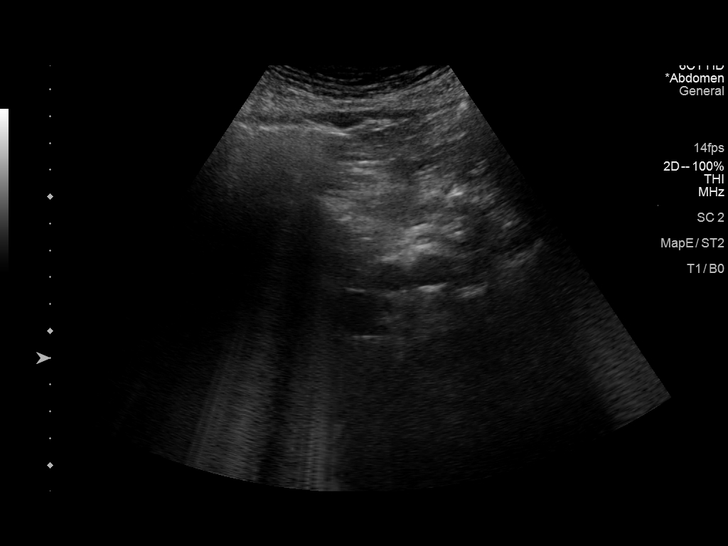
[im 14/18]
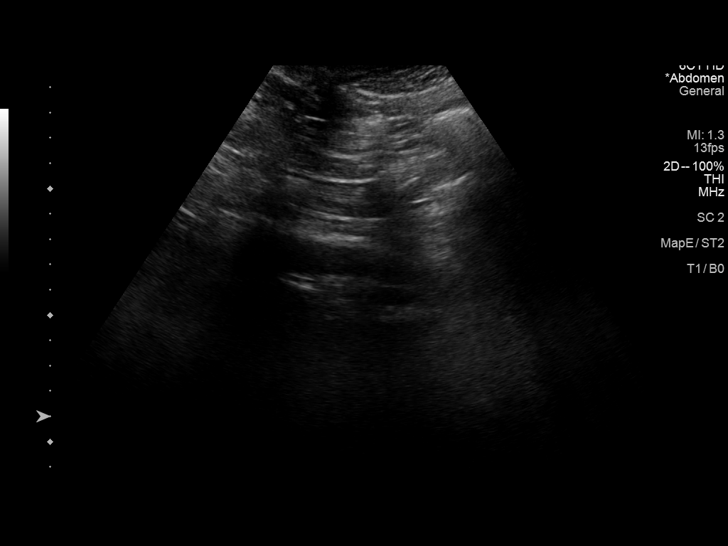
[im 15/18]
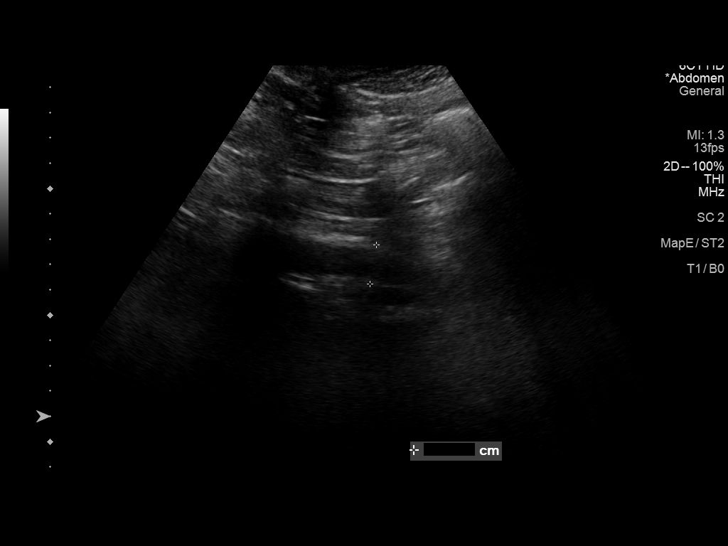
[im 17/18]
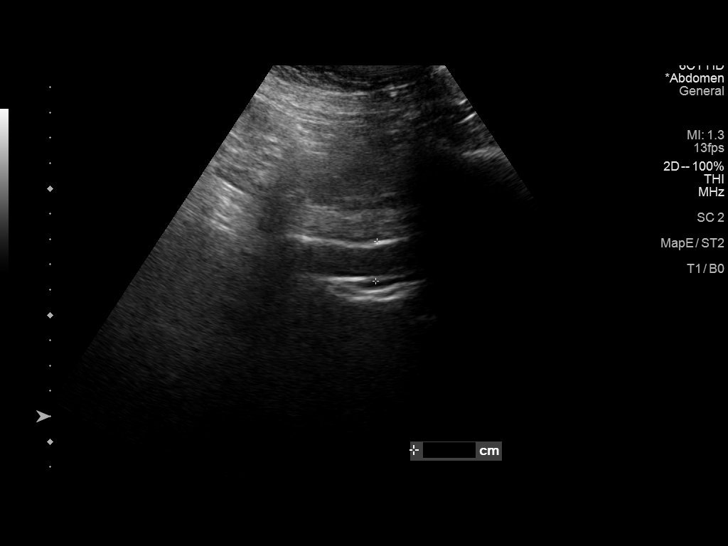
[im 18/18]
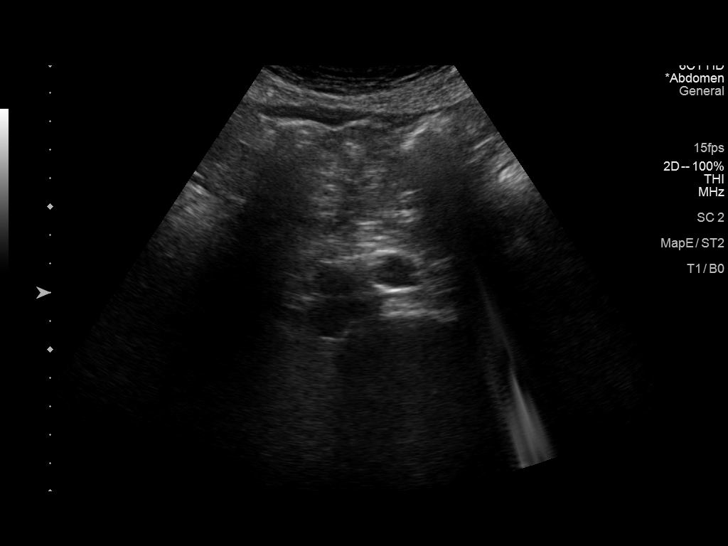

[14 of 18 positions shown; findings below may reference images not displayed]

FINDINGS: Abdominal aortic measurements as follows:

Proximal:  2.9 cm AP, 2.9 cm transverse

Mid:  2.2 cm AP, 2.2 cm transverse

Distal:  2.1 cm AP, 2.2 cm transverse
IMPRESSION: No evidence of abdominal aortic aneurysm.

## 2018-08-29 HISTORY — PX: COLONOSCOPY: SHX174

## 2018-09-16 ENCOUNTER — Emergency Department (HOSPITAL_COMMUNITY)
Admission: EM | Admit: 2018-09-16 | Discharge: 2018-09-16 | Disposition: A | Discharge status: Home / Self Care | Payer: Medicare PPO | Attending: Emergency Medicine | Admitting: Emergency Medicine

## 2018-09-16 ENCOUNTER — Encounter (HOSPITAL_COMMUNITY): Payer: Self-pay | Admitting: Emergency Medicine

## 2018-09-16 DIAGNOSIS — R300 Dysuria: Secondary | ICD-10-CM | POA: Insufficient documentation

## 2018-09-16 DIAGNOSIS — N4889 Other specified disorders of penis: Secondary | ICD-10-CM | POA: Insufficient documentation

## 2018-09-16 DIAGNOSIS — Z7982 Long term (current) use of aspirin: Secondary | ICD-10-CM | POA: Insufficient documentation

## 2018-09-16 DIAGNOSIS — Z8673 Personal history of transient ischemic attack (TIA), and cerebral infarction without residual deficits: Secondary | ICD-10-CM | POA: Insufficient documentation

## 2018-09-16 DIAGNOSIS — Z87891 Personal history of nicotine dependence: Secondary | ICD-10-CM | POA: Insufficient documentation

## 2018-09-16 DIAGNOSIS — R350 Frequency of micturition: Secondary | ICD-10-CM | POA: Diagnosis not present

## 2018-09-16 DIAGNOSIS — J449 Chronic obstructive pulmonary disease, unspecified: Secondary | ICD-10-CM | POA: Diagnosis not present

## 2018-09-16 DIAGNOSIS — E079 Disorder of thyroid, unspecified: Secondary | ICD-10-CM | POA: Diagnosis not present

## 2018-09-16 DIAGNOSIS — E860 Dehydration: Secondary | ICD-10-CM

## 2018-09-16 DIAGNOSIS — Z79899 Other long term (current) drug therapy: Secondary | ICD-10-CM | POA: Diagnosis not present

## 2018-09-16 DIAGNOSIS — N390 Urinary tract infection, site not specified: Secondary | ICD-10-CM | POA: Diagnosis not present

## 2018-09-16 DIAGNOSIS — R3911 Hesitancy of micturition: Secondary | ICD-10-CM | POA: Diagnosis not present

## 2018-09-16 LAB — CBC WITH DIFFERENTIAL/PLATELET
ABS IMMATURE GRANULOCYTES: 0.01 10*3/uL (ref 0.00–0.07)
BASOS PCT: 0 %
Basophils Absolute: 0 10*3/uL (ref 0.0–0.1)
Eosinophils Absolute: 0.2 10*3/uL (ref 0.0–0.5)
Eosinophils Relative: 4 %
HCT: 38.6 % — ABNORMAL LOW (ref 39.0–52.0)
Hemoglobin: 12.5 g/dL — ABNORMAL LOW (ref 13.0–17.0)
IMMATURE GRANULOCYTES: 0 %
Lymphocytes Relative: 48 %
Lymphs Abs: 3.3 10*3/uL (ref 0.7–4.0)
MCH: 32 pg (ref 26.0–34.0)
MCHC: 32.4 g/dL (ref 30.0–36.0)
MCV: 98.7 fL (ref 80.0–100.0)
MONO ABS: 0.6 10*3/uL (ref 0.1–1.0)
MONOS PCT: 10 %
NEUTROS ABS: 2.5 10*3/uL (ref 1.7–7.7)
NEUTROS PCT: 38 %
Platelets: 304 10*3/uL (ref 150–400)
RBC: 3.91 MIL/uL — AB (ref 4.22–5.81)
RDW: 12.4 % (ref 11.5–15.5)
WBC: 6.7 10*3/uL (ref 4.0–10.5)
nRBC: 0 % (ref 0.0–0.2)

## 2018-09-16 LAB — URINALYSIS, ROUTINE W REFLEX MICROSCOPIC
BILIRUBIN URINE: NEGATIVE
Glucose, UA: NEGATIVE mg/dL
Ketones, ur: NEGATIVE mg/dL
Nitrite: POSITIVE — AB
Protein, ur: 100 mg/dL — AB
RBC / HPF: >50 RBC/hpf — ABNORMAL HIGH (ref 0–5)
Specific Gravity, Urine: 1.02 (ref 1.005–1.030)
WBC, UA: >50 WBC/hpf — ABNORMAL HIGH (ref 0–5)
pH: 6 (ref 5.0–8.0)

## 2018-09-16 LAB — BASIC METABOLIC PANEL
Anion gap: 7 (ref 5–15)
BUN: 18 mg/dL (ref 8–23)
CALCIUM: 9 mg/dL (ref 8.9–10.3)
CO2: 27 mmol/L (ref 22–32)
Chloride: 107 mmol/L (ref 98–111)
Creatinine, Ser: 1.32 mg/dL — ABNORMAL HIGH (ref 0.61–1.24)
GFR calc Af Amer: >60 mL/min (ref >60)
GFR calc non Af Amer: 55 mL/min — ABNORMAL LOW (ref >60)
Glucose, Bld: 90 mg/dL (ref 70–99)
Potassium: 4.6 mmol/L (ref 3.5–5.1)
SODIUM: 141 mmol/L (ref 135–145)

## 2018-09-16 MED ORDER — SODIUM CHLORIDE 0.9 % IV BOLUS
1000.0000 mL | Freq: Once | INTRAVENOUS | Status: AC
  Administered 2018-09-16: 1000 mL via INTRAVENOUS

## 2018-09-16 MED ORDER — SODIUM CHLORIDE 0.9 % IV SOLN
1.0000 g | Freq: Once | INTRAVENOUS | Status: AC
  Administered 2018-09-16: 1 g via INTRAVENOUS
  Filled 2018-09-16: qty 10

## 2018-09-16 MED ORDER — CEPHALEXIN 250 MG PO CAPS: 250.0000 mg | ORAL_CAPSULE | Freq: Four times a day (QID) | ORAL | 0 refills | Status: DC

## 2018-09-16 NOTE — ED Provider Notes (Signed)
[facility] DEPT Provider Note   CSN: [id number] Arrival date & time: 09/16/18  1134     History   Chief Complaint Chief Complaint  Patient presents with  . Urinary Retention    HPI Bruce Sullivan is a 67 y.o. male.  He presents to the emergency department with 2 weeks of frequent urination and dysuria hesitancy.  Today he does not feel like he can even let his urine out.  Per he and his wife with a urine smells foul.  Plus minus fever.  No nausea no vomiting no abdominal pain no diarrhea no chest pain no shortness of breath.  He is never had this symptom before.  He denies any new sexual contacts.  He has not noticed any lesions or discharge from the penis.  The history is provided by the patient and the spouse.  Male GU Problem  Primary symptoms include dysuria, penile pain.  Primary symptoms include no genital lesions, no genital rash, no penile discharge, no scrotal pain. This is a new problem. The current episode started more than 1 week ago. The problem occurs constantly. The problem has not changed since onset.The symptoms occur during urination. Associated symptoms include frequency. Pertinent negatives include no nausea, no vomiting, no abdominal pain, no constipation and no diarrhea. There has been no fever. He has tried nothing for the symptoms. The treatment provided no relief.    Past Medical History:  Diagnosis Date  . COPD (chronic obstructive pulmonary disease) ([place])   . Thyroid disease   . TIA (transient ischemic attack)     There are no active problems to display for this patient.   Past Surgical History:  Procedure Laterality Date  . HERNIA REPAIR    . t monitor          Home Medications    Prior to Admission medications   Medication Sig Start Date End Date Taking? Authorizing Provider  amLODipine (NORVASC) 5 MG tablet Take 5 mg by mouth every morning.    [provider]  aspirin EC 81 MG tablet Take 81 mg by  mouth every morning.    [provider]  budesonide-formoterol (SYMBICORT) 80-4.5 MCG/ACT inhaler Inhale 2 puffs into the lungs 2 (two) times daily.    [provider]  doxycycline (VIBRAMYCIN) 100 MG capsule Take 1 capsule (100 mg total) by mouth 2 (two) times daily. 09/03/16   Tanna Furry, MD  HYDROcodone-acetaminophen (NORCO/VICODIN) 5-325 MG tablet Take 1 tablet by mouth every 6 (six) hours as needed. 04/05/17   Ward, Ozella Almond, PA-C  ibuprofen (ADVIL,MOTRIN) 800 MG tablet Take 800 mg by mouth every 8 (eight) hours as needed for moderate pain.    [provider]  indomethacin (INDOCIN) 25 MG capsule Take 1-2 capsules (25-50 mg total) by mouth 3 (three) times daily with meals. During gout attack, 04/05/17   Ward, Ozella Almond, PA-C  levETIRAcetam (KEPPRA) 500 MG tablet Take 250 mg by mouth 2 (two) times daily.    [provider]    Family History No family history on file.  Social History Social History   Tobacco Use  . Smoking status: Former Research scientist (life sciences)  . Smokeless tobacco: Never Used  Substance Use Topics  . Alcohol use: No  . Drug use: No     Allergies   Patient has no known allergies.   Review of Systems Review of Systems  Constitutional: Negative for fever.  HENT: Negative for sore throat.   Eyes: Negative for visual  disturbance.  Respiratory: Negative for shortness of breath.   Cardiovascular: Negative for chest pain.  Gastrointestinal: Negative for abdominal pain, constipation, diarrhea, nausea and vomiting.  Genitourinary: Positive for dysuria, frequency and penile pain. Negative for penile discharge.  Musculoskeletal: Negative for back pain.  Skin: Negative for rash.  Neurological: Negative for numbness.     Physical Exam Updated Vital Signs BP (!) 157/61 (BP Location: Right Arm)   Pulse 96   Temp 99.9 F (37.7 C) (Oral)   Resp 19   SpO2 99%   Physical Exam  Constitutional: He appears well-developed and  well-nourished.  HENT:  Head: Normocephalic and atraumatic.  Eyes: Conjunctivae are normal.  Neck: Neck supple.  Cardiovascular: Normal rate and regular rhythm.  No murmur heard. Pulmonary/Chest: Effort normal and breath sounds normal. No respiratory distress.  Abdominal: Soft. There is no tenderness.  Genitourinary: Testes normal and penis normal. Right testis shows no mass. Left testis shows no mass. Circumcised. No penile tenderness. No discharge found.  Musculoskeletal: He exhibits no edema.  Neurological: He is alert.  Skin: Skin is warm and dry.  Psychiatric: He has a normal mood and affect.  Nursing note and vitals reviewed.    ED Treatments / Results  Labs (all labs ordered are listed, but only abnormal results are displayed) Labs Reviewed  URINALYSIS, ROUTINE W REFLEX MICROSCOPIC - Abnormal; Notable for the following components:      Result Value   APPearance TURBID (*)    Hgb urine dipstick LARGE (*)    Protein, ur 100 (*)    Nitrite POSITIVE (*)    Leukocytes, UA LARGE (*)    RBC / HPF >50 (*)    WBC, UA >50 (*)    Bacteria, UA RARE (*)    All other components within normal limits  BASIC METABOLIC PANEL - Abnormal; Notable for the following components:   Creatinine, Ser 1.32 (*)    GFR calc non Af Amer 55 (*)    All other components within normal limits  CBC WITH DIFFERENTIAL/PLATELET - Abnormal; Notable for the following components:   RBC 3.91 (*)    Hemoglobin 12.5 (*)    HCT 38.6 (*)    All other components within normal limits  URINE CULTURE    EKG None  Radiology No results found.  Procedures Procedures (including critical care time)  Medications Ordered in ED Medications  sodium chloride 0.9 % bolus 1,000 mL (0 mLs Intravenous Stopped 09/16/18 1410)  cefTRIAXone (ROCEPHIN) 1 g in sodium chloride 0.9 % 100 mL IVPB (0 g Intravenous Stopped 09/16/18 1406)     Initial Impression / Assessment and Plan / ED Course  I have reviewed the triage  vital signs and the nursing notes.  Pertinent labs & imaging results that were available during my care of the patient were reviewed by me and considered in my medical decision making (see chart for details).  Clinical Course as of Sep 17 805  [facility] Sep 16, 2018  1437 She is lab work fairly unremarkable other than probably little dehydration.  His urinalysis showed reds whites nitrite positive.  He is received a gram of ceftriaxone I will send him home with some Keflex.  Try to get him to give a urine culture before he leaves.   [MB]    Clinical Course User Index [MB] Hayden Rasmussen, MD      Final Clinical Impressions(s) / ED Diagnoses   Final diagnoses:  Dysuria  Urinary tract infection in male  Dehydration    ED Discharge Orders         Ordered    cephALEXin (KEFLEX) 250 MG capsule  4 times daily     09/16/18 1439           Hayden Rasmussen, MD 09/17/18 [phone]

## 2018-09-16 NOTE — ED Triage Notes (Signed)
Patient here from home with complaints of urinary frequency x1 month reports going to the restroom over 20 x daily. States that it feels like he has to go but only dribbling. Also reports foul smell and fever. Motrin with no relief.

## 2018-09-16 NOTE — Discharge Instructions (Addendum)
You were evaluated in the emergency department for difficulty passing urine and burning with urination.  Your testing showed that you have a urinary tract infection.  You were given IV fluids and antibiotics in the department and are being sent home with a prescription to treat this infection for 1 week.  Please follow-up with your doctor and return if any worsening symptoms.  Please try to stay well-hydrated.

## 2018-09-16 NOTE — ED Notes (Signed)
Bladder Scanner: 20ML

## 2018-09-18 LAB — URINE CULTURE
Culture: 70000 — AB
SPECIAL REQUESTS: NORMAL

## 2018-09-19 ENCOUNTER — Telehealth: Payer: Self-pay | Admitting: *Deleted

## 2018-09-19 NOTE — Telephone Encounter (Signed)
Post ED Visit - Positive Culture Follow-up  Culture report reviewed by antimicrobial stewardship pharmacist:  []  Elenor Quinones, Pharm.D. []  Heide Guile, Pharm.D., BCPS AQ-ID []  Parks Neptune, Pharm.D., BCPS []  Alycia Rossetti, Pharm.D., BCPS []  [place], Pharm.D., BCPS, AAHIVP []  Legrand Como, Pharm.D., BCPS, AAHIVP []  Salome Arnt, PharmD, BCPS []  Johnnette Gourd, PharmD, BCPS []  Hughes Better, PharmD, BCPS []  Leeroy Cha, PharmD Tamela Gammon, PharmD  Positive urine culture Treated with Cephalexin, organism sensitive to the same and no further patient follow-up is required at this time.  Harlon Flor [facility] 09/19/2018, 10:17 AM

## 2018-09-20 ENCOUNTER — Encounter (HOSPITAL_COMMUNITY): Payer: Self-pay | Admitting: *Deleted

## 2018-09-20 ENCOUNTER — Emergency Department (HOSPITAL_COMMUNITY)
Admission: EM | Admit: 2018-09-20 | Discharge: 2018-09-20 | Disposition: A | Discharge status: Home / Self Care | Payer: Medicare PPO | Attending: Emergency Medicine | Admitting: Emergency Medicine

## 2018-09-20 ENCOUNTER — Other Ambulatory Visit: Payer: Self-pay

## 2018-09-20 DIAGNOSIS — Z7982 Long term (current) use of aspirin: Secondary | ICD-10-CM | POA: Diagnosis not present

## 2018-09-20 DIAGNOSIS — R339 Retention of urine, unspecified: Secondary | ICD-10-CM | POA: Diagnosis present

## 2018-09-20 DIAGNOSIS — N390 Urinary tract infection, site not specified: Secondary | ICD-10-CM | POA: Insufficient documentation

## 2018-09-20 DIAGNOSIS — R338 Other retention of urine: Secondary | ICD-10-CM

## 2018-09-20 DIAGNOSIS — Z79899 Other long term (current) drug therapy: Secondary | ICD-10-CM | POA: Insufficient documentation

## 2018-09-20 DIAGNOSIS — Z8673 Personal history of transient ischemic attack (TIA), and cerebral infarction without residual deficits: Secondary | ICD-10-CM | POA: Diagnosis not present

## 2018-09-20 DIAGNOSIS — J449 Chronic obstructive pulmonary disease, unspecified: Secondary | ICD-10-CM | POA: Diagnosis not present

## 2018-09-20 DIAGNOSIS — Z87891 Personal history of nicotine dependence: Secondary | ICD-10-CM | POA: Diagnosis not present

## 2018-09-20 LAB — URINALYSIS, ROUTINE W REFLEX MICROSCOPIC
Bilirubin Urine: NEGATIVE
Glucose, UA: NEGATIVE mg/dL
Ketones, ur: NEGATIVE mg/dL
Nitrite: NEGATIVE
Protein, ur: 100 mg/dL — AB
RBC / HPF: >50 RBC/hpf — ABNORMAL HIGH (ref 0–5)
Specific Gravity, Urine: 1.014 (ref 1.005–1.030)
WBC, UA: >50 WBC/hpf — ABNORMAL HIGH (ref 0–5)
pH: 7 (ref 5.0–8.0)

## 2018-09-20 MED ORDER — CIPROFLOXACIN HCL 500 MG PO TABS: 500.0000 mg | ORAL_TABLET | Freq: Two times a day (BID) | ORAL | 0 refills | Status: DC

## 2018-09-20 MED ORDER — CIPROFLOXACIN HCL 500 MG PO TABS
500.0000 mg | ORAL_TABLET | Freq: Once | ORAL | Status: AC
  Administered 2018-09-20: 500 mg via ORAL
  Filled 2018-09-20: qty 1

## 2018-09-20 NOTE — ED Notes (Signed)
Bladder scan showed 488 mL of urine in bladder.

## 2018-09-20 NOTE — ED Provider Notes (Signed)
Pain.  Patient's discomfort relieved.  [facility] DEPT Provider Note: Georgena Spurling, MD, FACEP  CSN: [id number] MRN: [id number] ARRIVAL: 09/20/18 at [place]: [id number]   CHIEF COMPLAINT  Urinary Retention   HISTORY OF PRESENT ILLNESS  09/20/18 2:23 AM Bruce Sullivan is a 67 y.o. male who was seen on November 11 and diagnosed with a urinary tract infection.  He was discharged on Keflex 250 mg 4 times a day.  For the past 2 days he has only been urinating small amounts and having increased pressure in the bladder.  He has not had a fever to his knowledge but he was noted to have a temperature of 99.4 on arrival here.  He is complaining of severe pain in his bladder with associated distention.   Past Medical History:  Diagnosis Date  . COPD (chronic obstructive pulmonary disease) ([place])   . Thyroid disease   . TIA (transient ischemic attack)     Past Surgical History:  Procedure Laterality Date  . HERNIA REPAIR    . t monitor      No family history on file.  Social History   Tobacco Use  . Smoking status: Former Research scientist (life sciences)  . Smokeless tobacco: Never Used  Substance Use Topics  . Alcohol use: No  . Drug use: No    Prior to Admission medications   Medication Sig Start Date End Date Taking? Authorizing Provider  allopurinol (ZYLOPRIM) 100 MG tablet Take 100 mg by mouth 2 (two) times daily. 08/02/18 08/02/19  [provider]  amLODipine (NORVASC) 5 MG tablet Take 5 mg by mouth every morning.    [provider]  aspirin EC 81 MG tablet Take 81 mg by mouth every morning.    [provider]  atorvastatin (LIPITOR) 20 MG tablet Take 20 mg by mouth at bedtime. 04/10/17   [provider]  budesonide-formoterol (SYMBICORT) 80-4.5 MCG/ACT inhaler Inhale 2 puffs into the lungs 2 (two) times daily.    [provider]  cephALEXin (KEFLEX) 250 MG capsule Take 1 capsule (250 mg total) by mouth 4 (four) times daily. 09/16/18   Hayden Rasmussen, MD   chlorthalidone (HYGROTON) 50 MG tablet Take 50 mg by mouth daily.    [provider]  doxycycline (VIBRAMYCIN) 100 MG capsule Take 1 capsule (100 mg total) by mouth 2 (two) times daily. Patient not taking: Reported on 09/16/2018 09/03/16   Tanna Furry, MD  FLUoxetine (PROZAC) 20 MG capsule Take 20 mg by mouth daily. 08/02/18 08/02/19  [provider]  HYDROcodone-acetaminophen (NORCO/VICODIN) 5-325 MG tablet Take 1 tablet by mouth every 6 (six) hours as needed. Patient not taking: Reported on 09/16/2018 04/05/17   Ward, Ozella Almond, PA-C  ibuprofen (ADVIL,MOTRIN) 800 MG tablet Take 800 mg by mouth every 8 (eight) hours as needed for moderate pain.    [provider]  indomethacin (INDOCIN) 25 MG capsule Take 1-2 capsules (25-50 mg total) by mouth 3 (three) times daily with meals. During gout attack, Patient not taking: Reported on 09/16/2018 04/05/17   Ward, Ozella Almond, PA-C  levETIRAcetam (KEPPRA) 500 MG tablet Take 250 mg by mouth 2 (two) times daily.    [provider]  omeprazole (PRILOSEC) 20 MG capsule Take 20 mg by mouth daily.    [provider]    Allergies Patient has no known allergies.   REVIEW OF SYSTEMS  Negative except as noted here or in the History of Present Illness.   PHYSICAL EXAMINATION  Initial Vital Signs  Blood pressure (!) 147/101, pulse (!) 122, temperature 99.4 F (37.4 C), temperature source Oral, resp. rate 18, SpO2 94 %.  Examination General: Well-developed, well-nourished male in no acute distress; appearance consistent with age of record HENT: normocephalic; atraumatic Eyes: pupils equal, round and reactive to light; extraocular muscles intact Neck: supple Heart: regular rate and rhythm Lungs: clear to auscultation bilaterally Abdomen: soft; distended, tender bladder; bowel sounds present Extremities: No deformity; full range of motion; pulses normal Neurologic: Awake, alert; motor function intact in all  extremities and symmetric; no facial droop Skin: Warm and dry Psychiatric: Grimacing   RESULTS  Summary of this visit's results, reviewed by myself:   EKG Interpretation  Date/Time:    Ventricular Rate:    PR Interval:    QRS Duration:   QT Interval:    QTC Calculation:   R Axis:     Text Interpretation:        Laboratory Studies: Results for orders placed or performed during the hospital encounter of 09/20/18 (from the past 24 hour(s))  Urinalysis, Routine w reflex microscopic- may I&O cath if menses     Status: Abnormal   Collection Time: 09/20/18  4:18 AM  Result Value Ref Range   Color, Urine YELLOW YELLOW   APPearance CLOUDY (A) CLEAR   Specific Gravity, Urine 1.014 1.005 - 1.030   pH 7.0 5.0 - 8.0   Glucose, UA NEGATIVE NEGATIVE mg/dL   Hgb urine dipstick LARGE (A) NEGATIVE   Bilirubin Urine NEGATIVE NEGATIVE   Ketones, ur NEGATIVE NEGATIVE mg/dL   Protein, ur 100 (A) NEGATIVE mg/dL   Nitrite NEGATIVE NEGATIVE   Leukocytes, UA LARGE (A) NEGATIVE   RBC / HPF >50 (H) 0 - 5 RBC/hpf   WBC, UA >50 (H) 0 - 5 WBC/hpf   Bacteria, UA RARE (A) NONE SEEN   Imaging Studies: No results found.  ED COURSE and MDM  Nursing notes and initial vitals signs, including pulse oximetry, reviewed.  Vitals:   09/20/18 0209  BP: (!) 147/101  Pulse: (!) 122  Resp: 18  Temp: 99.4 F (37.4 C)  TempSrc: Oral  SpO2: 94%   4:20 AM Foley catheter placed by nursing staff.  Over 900 mL of cloudy amber urine obtained.  Patient's discomfort relieved.  4:49 AM Urinalysis consistent with continued urinary tract infection.  Although his culture showed cephalosporin sensitivity in light of failure of treatment and with Foley catheter now in place we will change him to ciprofloxacin.  He has an appointment with his medical doctor on the 18th of this month which is 3 days from today.  PROCEDURES    ED DIAGNOSES     ICD-10-CM   1. Acute urinary retention R33.8   2. Lower urinary tract  infectious disease N39.0        Deshanta Lady, MD 09/20/18 [phone]

## 2018-09-20 NOTE — ED Triage Notes (Signed)
Pt reports he was seen here earlier in the week, given abx for UTI. Since Tuesday, has only urinated small amounts at a time and having increased lower abdominal pressure. Denies fevers.

## 2018-09-21 LAB — URINE CULTURE: Culture: NO GROWTH

## 2018-09-23 ENCOUNTER — Emergency Department (HOSPITAL_COMMUNITY)
Admission: EM | Admit: 2018-09-23 | Discharge: 2018-09-23 | Disposition: A | Discharge status: Home / Self Care | Payer: Medicare PPO | Attending: Emergency Medicine | Admitting: Emergency Medicine

## 2018-09-23 ENCOUNTER — Encounter (HOSPITAL_COMMUNITY): Payer: Self-pay | Admitting: Emergency Medicine

## 2018-09-23 DIAGNOSIS — Z7982 Long term (current) use of aspirin: Secondary | ICD-10-CM | POA: Insufficient documentation

## 2018-09-23 DIAGNOSIS — J449 Chronic obstructive pulmonary disease, unspecified: Secondary | ICD-10-CM | POA: Insufficient documentation

## 2018-09-23 DIAGNOSIS — Z466 Encounter for fitting and adjustment of urinary device: Secondary | ICD-10-CM | POA: Diagnosis not present

## 2018-09-23 DIAGNOSIS — Z87891 Personal history of nicotine dependence: Secondary | ICD-10-CM | POA: Diagnosis not present

## 2018-09-23 DIAGNOSIS — R339 Retention of urine, unspecified: Secondary | ICD-10-CM | POA: Diagnosis present

## 2018-09-23 DIAGNOSIS — Z8673 Personal history of transient ischemic attack (TIA), and cerebral infarction without residual deficits: Secondary | ICD-10-CM | POA: Diagnosis not present

## 2018-09-23 DIAGNOSIS — Z79899 Other long term (current) drug therapy: Secondary | ICD-10-CM | POA: Insufficient documentation

## 2018-09-23 MED ORDER — TAMSULOSIN HCL 0.4 MG PO CAPS: 0.4000 mg | ORAL_CAPSULE | Freq: Every day | ORAL | 0 refills | Status: DC

## 2018-09-23 NOTE — ED Provider Notes (Signed)
[name] Provider Note   CSN: [id number] Arrival date & time: 09/23/18  1302     History   Chief Complaint Chief Complaint  Patient presents with  . foley catheter removed    HPI Bruce Sullivan is a 67 y.o. male.  HPI Patient is a 67 year old male who presents to the emergency department requesting that his Foley catheter be removed.  Catheter was placed on 09/20/2018 after inability to urinate.  He was on antibiotics at that time for UTI.  They have contacted outpatient urology office and is scheduled to see urology next week but the urology department recommended that he return to the ER for removal of his catheter.  Patient reports no complaints at this time.  No complications.  No fevers.  No vomiting.  Request catheter be removed.   Past Medical History:  Diagnosis Date  . COPD (chronic obstructive pulmonary disease) ([place])   . Thyroid disease   . TIA (transient ischemic attack)     There are no active problems to display for this patient.   Past Surgical History:  Procedure Laterality Date  . HERNIA REPAIR    . t monitor          Home Medications    Prior to Admission medications   Medication Sig Start Date End Date Taking? Authorizing Provider  allopurinol (ZYLOPRIM) 100 MG tablet Take 100 mg by mouth 2 (two) times daily. 08/02/18 08/02/19  [provider]  amLODipine (NORVASC) 5 MG tablet Take 5 mg by mouth every morning.    [provider]  aspirin EC 81 MG tablet Take 81 mg by mouth every morning.    [provider]  atorvastatin (LIPITOR) 20 MG tablet Take 20 mg by mouth at bedtime. 04/10/17   [provider]  budesonide-formoterol (SYMBICORT) 80-4.5 MCG/ACT inhaler Inhale 2 puffs into the lungs 2 (two) times daily.    [provider]  chlorthalidone (HYGROTON) 50 MG tablet Take 50 mg by mouth daily.    [provider]  ciprofloxacin (CIPRO) 500 MG tablet Take 1 tablet  (500 mg total) by mouth 2 (two) times daily. One po bid x 7 days 09/20/18   Molpus, John, MD  FLUoxetine (PROZAC) 20 MG capsule Take 20 mg by mouth daily. 08/02/18 08/02/19  [provider]  ibuprofen (ADVIL,MOTRIN) 800 MG tablet Take 800 mg by mouth every 8 (eight) hours as needed for moderate pain.    [provider]  levETIRAcetam (KEPPRA) 500 MG tablet Take 250 mg by mouth 2 (two) times daily.    [provider]  omeprazole (PRILOSEC) 20 MG capsule Take 20 mg by mouth daily.    [provider]  tamsulosin (FLOMAX) 0.4 MG CAPS capsule Take 1 capsule (0.4 mg total) by mouth daily. 09/23/18   Jola Schmidt, MD    Family History No family history on file.  Social History Social History   Tobacco Use  . Smoking status: Former Research scientist (life sciences)  . Smokeless tobacco: Never Used  Substance Use Topics  . Alcohol use: No  . Drug use: No     Allergies   Patient has no known allergies.   Review of Systems Review of Systems  All other systems reviewed and are negative.    Physical Exam Updated Vital Signs BP (!) 144/97   Pulse 93   Temp 99.4 F (37.4 C) (Oral)   Resp 15   SpO2 100%   Physical Exam  Constitutional: He is oriented  to person, place, and time. He appears well-developed and well-nourished.  HENT:  Head: Normocephalic.  Eyes: EOM are normal.  Neck: Normal range of motion.  Pulmonary/Chest: Effort normal.  Abdominal: He exhibits no distension.  Genitourinary:  Genitourinary Comments: Normal external genitalia.  Catheter in place.  Musculoskeletal: Normal range of motion.  Neurological: He is alert and oriented to person, place, and time.  Psychiatric: He has a normal mood and affect.  Nursing note and vitals reviewed.    ED Treatments / Results  Labs (all labs ordered are listed, but only abnormal results are displayed) Labs Reviewed - No data to display  EKG None  Radiology No results found.  Procedures .Foreign Body  Removal Performed by: Jola Schmidt, MD Authorized by: Jola Schmidt, MD  Consent: Verbal consent obtained. Risks and benefits: risks, benefits and alternatives were discussed Consent given by: patient Time out: Immediately prior to procedure a "time out" was called to verify the correct patient, procedure, equipment, support staff and site/side marked as required.  Sedation: Patient sedated: no  Comments: Foley Catheter removed without difficulty   (including critical care time)  Medications Ordered in ED Medications - No data to display   Initial Impression / Assessment and Plan / ED Course  I have reviewed the triage vital signs and the nursing notes.  Pertinent labs & imaging results that were available during my care of the patient were reviewed by me and considered in my medical decision making (see chart for details).     Catheter removed without difficulty.  Patient and family understand that he is at risk for recurrent urinary retention tonight and may require repeat evaluation in the emergency department later for placement of catheter.  Patient will be started on Flomax.  Outpatient urology follow-up.  Final Clinical Impressions(s) / ED Diagnoses   Final diagnoses:  Urinary retention    ED Discharge Orders         Ordered    tamsulosin (FLOMAX) 0.4 MG CAPS capsule  Daily     09/23/18 1349           Jola Schmidt, MD 09/23/18 1351

## 2018-09-23 NOTE — ED Triage Notes (Signed)
Pt had swollen prostate last week and had foley cath placed last week. Was told if cant get into urology, then to come back to Ed for catheter to be removed.

## 2018-09-23 NOTE — Discharge Instructions (Addendum)
Follow up at the urology office next week as scheduled  Return to the ER tonight if you are unable to urinate

## 2018-09-24 ENCOUNTER — Emergency Department (HOSPITAL_COMMUNITY)
Admission: EM | Admit: 2018-09-24 | Discharge: 2018-09-24 | Disposition: A | Discharge status: Home / Self Care | Payer: Medicare PPO | Attending: Emergency Medicine | Admitting: Emergency Medicine

## 2018-09-24 DIAGNOSIS — R339 Retention of urine, unspecified: Secondary | ICD-10-CM

## 2018-09-24 DIAGNOSIS — Z87891 Personal history of nicotine dependence: Secondary | ICD-10-CM | POA: Diagnosis not present

## 2018-09-24 DIAGNOSIS — Z7982 Long term (current) use of aspirin: Secondary | ICD-10-CM | POA: Diagnosis not present

## 2018-09-24 DIAGNOSIS — Z79899 Other long term (current) drug therapy: Secondary | ICD-10-CM | POA: Diagnosis not present

## 2018-09-24 DIAGNOSIS — J449 Chronic obstructive pulmonary disease, unspecified: Secondary | ICD-10-CM | POA: Diagnosis not present

## 2018-09-24 NOTE — ED Provider Notes (Signed)
[facility] DEPT Provider Note   CSN: [id number] Arrival date & time: 09/24/18  1524     History   Chief Complaint Chief Complaint  Patient presents with  . Urinary Retention    HPI Bruce Sullivan is a 67 y.o. male.  HPI  67 year old male with urinary retention.  He was initially seen on 11/15 for the same.  He had a Foley catheter placed.  He was diagnosed with a UTI and also start on antibiotics.  He presented the emergency room yesterday requesting the catheter be removed despite provider concerns that he may start retaining again.  He was removed at his request though.  Since that time his been unable to void.  He has had increasing suprapubic pain and pressure.  He has not had a chance to follow-up with urology yet.  He was started on Flomax.  Past Medical History:  Diagnosis Date  . COPD (chronic obstructive pulmonary disease) ([place])   . Thyroid disease   . TIA (transient ischemic attack)     There are no active problems to display for this patient.   Past Surgical History:  Procedure Laterality Date  . HERNIA REPAIR    . t monitor          Home Medications    Prior to Admission medications   Medication Sig Start Date End Date Taking? Authorizing Provider  allopurinol (ZYLOPRIM) 100 MG tablet Take 100 mg by mouth 2 (two) times daily. 08/02/18 08/02/19  [provider]  amLODipine (NORVASC) 5 MG tablet Take 5 mg by mouth every morning.    [provider]  aspirin EC 81 MG tablet Take 81 mg by mouth every morning.    [provider]  atorvastatin (LIPITOR) 20 MG tablet Take 20 mg by mouth at bedtime. 04/10/17   [provider]  budesonide-formoterol (SYMBICORT) 80-4.5 MCG/ACT inhaler Inhale 2 puffs into the lungs 2 (two) times daily.    [provider]  chlorthalidone (HYGROTON) 50 MG tablet Take 50 mg by mouth daily.    [provider]  ciprofloxacin (CIPRO) 500 MG tablet Take 1  tablet (500 mg total) by mouth 2 (two) times daily. One po bid x 7 days 09/20/18   Molpus, John, MD  FLUoxetine (PROZAC) 20 MG capsule Take 20 mg by mouth daily. 08/02/18 08/02/19  [provider]  ibuprofen (ADVIL,MOTRIN) 800 MG tablet Take 800 mg by mouth every 8 (eight) hours as needed for moderate pain.    [provider]  levETIRAcetam (KEPPRA) 500 MG tablet Take 250 mg by mouth 2 (two) times daily.    [provider]  omeprazole (PRILOSEC) 20 MG capsule Take 20 mg by mouth daily.    [provider]  tamsulosin (FLOMAX) 0.4 MG CAPS capsule Take 1 capsule (0.4 mg total) by mouth daily. 09/23/18   Jola Schmidt, MD    Family History No family history on file.  Social History Social History   Tobacco Use  . Smoking status: Former Research scientist (life sciences)  . Smokeless tobacco: Never Used  Substance Use Topics  . Alcohol use: No  . Drug use: No     Allergies   Patient has no known allergies.   Review of Systems Review of Systems  All systems reviewed and negative, other than as noted in HPI. Physical Exam Updated Vital Signs BP 130/89 (BP Location: Left Arm)   Pulse (!) 101   Temp 98.7 F (37.1 C) (Oral)   Resp 15  Ht 5\' 10"  (1.778 m)   Wt 77.1 kg   SpO2 96%   BMI 24.39 kg/m   Physical Exam  Constitutional: He appears well-developed and well-nourished. No distress.  HENT:  Head: Normocephalic and atraumatic.  Eyes: Conjunctivae are normal. Right eye exhibits no discharge. Left eye exhibits no discharge.  Neck: Neck supple.  Cardiovascular: Normal rate, regular rhythm and normal heart sounds. Exam reveals no gallop and no friction rub.  No murmur heard. Pulmonary/Chest: Effort normal and breath sounds normal. No respiratory distress.  Abdominal: Soft. He exhibits no distension. There is no tenderness.  Bladder palpably distended and tender.  Musculoskeletal: He exhibits no edema or tenderness.  Neurological: He is alert.  Skin: Skin is warm and  dry.  Psychiatric: He has a normal mood and affect. His behavior is normal. Thought content normal.  Nursing note and vitals reviewed.    ED Treatments / Results  Labs (all labs ordered are listed, but only abnormal results are displayed) Labs Reviewed - No data to display  EKG None  Radiology No results found.  Procedures Procedures (including critical care time)  Medications Ordered in ED Medications - No data to display   Initial Impression / Assessment and Plan / ED Course  I have reviewed the triage vital signs and the nursing notes.  Pertinent labs & imaging results that were available during my care of the patient were reviewed by me and considered in my medical decision making (see chart for details).     67 year old male with urinary retention.  Will replace Foley catheter.  He had been trying to follow-up with urology through [facility] but they are unable to see him until December.  We will give follow-up information for [facility].  Continue Flomax.  Catheter placed.  Feeling much better.  Continued catheter care discussed.  We will give him contact information for [facility].   Final Clinical Impressions(s) / ED Diagnoses   Final diagnoses:  Urinary retention    ED Discharge Orders    None       Virgel Manifold, MD 09/27/18 [phone]

## 2018-09-24 NOTE — ED Triage Notes (Signed)
Pt had catheter removed yesterday and has not urinated since having the catheter removed. Pt took flomax yesterday afternoon.

## 2018-10-24 ENCOUNTER — Other Ambulatory Visit: Payer: Self-pay | Admitting: Urology

## 2018-11-01 ENCOUNTER — Encounter (HOSPITAL_BASED_OUTPATIENT_CLINIC_OR_DEPARTMENT_OTHER): Payer: Self-pay

## 2018-11-07 ENCOUNTER — Other Ambulatory Visit: Payer: Self-pay

## 2018-11-07 ENCOUNTER — Encounter (HOSPITAL_BASED_OUTPATIENT_CLINIC_OR_DEPARTMENT_OTHER): Payer: Self-pay | Admitting: *Deleted

## 2018-11-07 NOTE — Progress Notes (Addendum)
Spoke with patient and wife for pre op interview via telephone. NPO after MN. Patient to take prilosec, prozac, keppra and norvasc AM of surgery with a sip of water. Arrival time 0630. Will need ISTAT 8 and EKG AM of surgery. Patient to be OWER. [facility] rules discussed with patient and wife. Wife verbalized understanding that discharge time would be 9am the next morning.

## 2018-11-19 NOTE — Anesthesia Preprocedure Evaluation (Addendum)
Anesthesia Evaluation  Patient identified by MRN, date of birth, ID band Patient awake    Reviewed: Allergy & Precautions, H&P , NPO status , Patient's Chart, lab work & pertinent test results, reviewed documented beta blocker date and time   Airway Mallampati: I  TM Distance: >3 FB Neck ROM: full    Dental no notable dental hx. (+) Edentulous Upper, Edentulous Lower, Upper Dentures, Lower Dentures, Poor Dentition   Pulmonary shortness of breath and with exertion, asthma , COPD, former smoker,    Pulmonary exam normal breath sounds clear to auscultation       Cardiovascular Exercise Tolerance: Good hypertension, Pt. on medications negative cardio ROS   Rhythm:regular Rate:Normal     Neuro/Psych Seizures -, Well Controlled,  PSYCHIATRIC DISORDERS Anxiety Depression TIA   GI/Hepatic Neg liver ROS, GERD  Medicated,  Endo/Other  Hypothyroidism   Renal/GU CRFRenal disease  negative genitourinary   Musculoskeletal   Abdominal   Peds  Hematology negative hematology ROS (+)   Anesthesia Other Findings   Reproductive/Obstetrics negative OB ROS                            Anesthesia Physical Anesthesia Plan  ASA: III  Anesthesia Plan: General   Post-op Pain Management:    Induction: Intravenous  PONV Risk Score and Plan: 2 and Treatment may vary due to age or medical condition and Ondansetron  Airway Management Planned: LMA and Oral ETT  Additional Equipment:   Intra-op Plan:   Post-operative Plan: Extubation in OR  Informed Consent: I have reviewed the patients History and Physical, chart, labs and discussed the procedure including the risks, benefits and alternatives for the proposed anesthesia with the patient or authorized representative who has indicated his/her understanding and acceptance.     Dental Advisory Given  Plan Discussed with: CRNA, Anesthesiologist and  Surgeon  Anesthesia Plan Comments: ( )       Anesthesia Quick Evaluation

## 2018-11-20 ENCOUNTER — Ambulatory Visit (HOSPITAL_BASED_OUTPATIENT_CLINIC_OR_DEPARTMENT_OTHER): Payer: Medicare HMO | Admitting: Anesthesiology

## 2018-11-20 ENCOUNTER — Other Ambulatory Visit: Payer: Self-pay

## 2018-11-20 ENCOUNTER — Encounter (HOSPITAL_BASED_OUTPATIENT_CLINIC_OR_DEPARTMENT_OTHER): Payer: Self-pay

## 2018-11-20 ENCOUNTER — Ambulatory Visit (HOSPITAL_BASED_OUTPATIENT_CLINIC_OR_DEPARTMENT_OTHER)
Admission: RE | Admit: 2018-11-20 | Discharge: 2018-11-20 | Disposition: A | Discharge status: Home / Self Care | Payer: Medicare HMO | Attending: Urology | Admitting: Urology

## 2018-11-20 ENCOUNTER — Encounter (HOSPITAL_BASED_OUTPATIENT_CLINIC_OR_DEPARTMENT_OTHER)
Admission: RE | Disposition: A | Discharge status: Home / Self Care | Payer: Self-pay | Source: Home / Self Care | Attending: Urology

## 2018-11-20 DIAGNOSIS — E039 Hypothyroidism, unspecified: Secondary | ICD-10-CM | POA: Insufficient documentation

## 2018-11-20 DIAGNOSIS — Z7951 Long term (current) use of inhaled steroids: Secondary | ICD-10-CM | POA: Diagnosis not present

## 2018-11-20 DIAGNOSIS — Z8673 Personal history of transient ischemic attack (TIA), and cerebral infarction without residual deficits: Secondary | ICD-10-CM | POA: Insufficient documentation

## 2018-11-20 DIAGNOSIS — N138 Other obstructive and reflux uropathy: Secondary | ICD-10-CM | POA: Diagnosis not present

## 2018-11-20 DIAGNOSIS — F419 Anxiety disorder, unspecified: Secondary | ICD-10-CM | POA: Insufficient documentation

## 2018-11-20 DIAGNOSIS — I129 Hypertensive chronic kidney disease with stage 1 through stage 4 chronic kidney disease, or unspecified chronic kidney disease: Secondary | ICD-10-CM | POA: Insufficient documentation

## 2018-11-20 DIAGNOSIS — Z7982 Long term (current) use of aspirin: Secondary | ICD-10-CM | POA: Diagnosis not present

## 2018-11-20 DIAGNOSIS — F329 Major depressive disorder, single episode, unspecified: Secondary | ICD-10-CM | POA: Diagnosis not present

## 2018-11-20 DIAGNOSIS — J449 Chronic obstructive pulmonary disease, unspecified: Secondary | ICD-10-CM | POA: Diagnosis not present

## 2018-11-20 DIAGNOSIS — Z87891 Personal history of nicotine dependence: Secondary | ICD-10-CM | POA: Insufficient documentation

## 2018-11-20 DIAGNOSIS — N320 Bladder-neck obstruction: Secondary | ICD-10-CM | POA: Diagnosis not present

## 2018-11-20 DIAGNOSIS — K219 Gastro-esophageal reflux disease without esophagitis: Secondary | ICD-10-CM | POA: Insufficient documentation

## 2018-11-20 DIAGNOSIS — R338 Other retention of urine: Secondary | ICD-10-CM | POA: Insufficient documentation

## 2018-11-20 DIAGNOSIS — N182 Chronic kidney disease, stage 2 (mild): Secondary | ICD-10-CM | POA: Insufficient documentation

## 2018-11-20 DIAGNOSIS — Z79899 Other long term (current) drug therapy: Secondary | ICD-10-CM | POA: Diagnosis not present

## 2018-11-20 DIAGNOSIS — N401 Enlarged prostate with lower urinary tract symptoms: Secondary | ICD-10-CM | POA: Diagnosis present

## 2018-11-20 DIAGNOSIS — E785 Hyperlipidemia, unspecified: Secondary | ICD-10-CM | POA: Insufficient documentation

## 2018-11-20 DIAGNOSIS — N3080 Other cystitis without hematuria: Secondary | ICD-10-CM | POA: Insufficient documentation

## 2018-11-20 HISTORY — DX: Anxiety disorder, unspecified: F41.9

## 2018-11-20 HISTORY — DX: Emphysema, unspecified: J43.9

## 2018-11-20 HISTORY — DX: Essential (primary) hypertension: I10

## 2018-11-20 HISTORY — DX: Chronic kidney disease, stage 2 (mild): N18.2

## 2018-11-20 HISTORY — DX: Gout, unspecified: M10.9

## 2018-11-20 HISTORY — DX: Retention of urine, unspecified: R33.9

## 2018-11-20 HISTORY — DX: Dysphonia: R49.0

## 2018-11-20 HISTORY — DX: Cerebral infarction, unspecified: I63.9

## 2018-11-20 HISTORY — DX: Localized edema: R60.0

## 2018-11-20 HISTORY — DX: Cutaneous abscess of neck: L02.11

## 2018-11-20 HISTORY — DX: Frequency of micturition: R35.0

## 2018-11-20 HISTORY — DX: Unspecified atherosclerosis: I70.90

## 2018-11-20 HISTORY — DX: Major depressive disorder, single episode, unspecified: F32.9

## 2018-11-20 HISTORY — DX: Hyperlipidemia, unspecified: E78.5

## 2018-11-20 HISTORY — DX: Gastro-esophageal reflux disease without esophagitis: K21.9

## 2018-11-20 HISTORY — PX: CYSTOSCOPY WITH BIOPSY: SHX5122

## 2018-11-20 HISTORY — DX: Personal history of colonic polyps: Z86.010

## 2018-11-20 HISTORY — PX: TRANSURETHRAL RESECTION OF PROSTATE: SHX73

## 2018-11-20 HISTORY — DX: Unspecified asthma, uncomplicated: J45.909

## 2018-11-20 HISTORY — DX: Urinary tract infection, site not specified: N39.0

## 2018-11-20 HISTORY — DX: Unspecified convulsions: R56.9

## 2018-11-20 HISTORY — DX: Depression, unspecified: F32.A

## 2018-11-20 HISTORY — DX: Personal history of colon polyps, unspecified: Z86.0100

## 2018-11-20 LAB — POCT I-STAT, CHEM 8
BUN: 16 mg/dL (ref 8–23)
Calcium, Ion: 1.22 mmol/L (ref 1.15–1.40)
Chloride: 106 mmol/L (ref 98–111)
Creatinine, Ser: 1.1 mg/dL (ref 0.61–1.24)
Glucose, Bld: 99 mg/dL (ref 70–99)
HCT: 36 % — ABNORMAL LOW (ref 39.0–52.0)
Hemoglobin: 12.2 g/dL — ABNORMAL LOW (ref 13.0–17.0)
Potassium: 4 mmol/L (ref 3.5–5.1)
Sodium: 142 mmol/L (ref 135–145)
TCO2: 26 mmol/L (ref 22–32)

## 2018-11-20 SURGERY — TURP (TRANSURETHRAL RESECTION OF PROSTATE)
Anesthesia: General

## 2018-11-20 MED ORDER — CIPROFLOXACIN IN D5W 400 MG/200ML IV SOLN
INTRAVENOUS | Status: AC
  Filled 2018-11-20: qty 200

## 2018-11-20 MED ORDER — HYDROCODONE-ACETAMINOPHEN 5-325 MG PO TABS: 1.0000 | ORAL_TABLET | ORAL | 0 refills | Status: DC | PRN

## 2018-11-20 MED ORDER — PHENAZOPYRIDINE HCL 200 MG PO TABS: 200.0000 mg | ORAL_TABLET | Freq: Three times a day (TID) | ORAL | 0 refills | Status: AC | PRN

## 2018-11-20 MED ORDER — LACTATED RINGERS IV SOLN
INTRAVENOUS | Status: DC
  Administered 2018-11-20 (×3): via INTRAVENOUS
  Filled 2018-11-20: qty 1000

## 2018-11-20 MED ORDER — ONDANSETRON HCL 4 MG/2ML IJ SOLN
INTRAMUSCULAR | Status: AC
  Filled 2018-11-20: qty 2

## 2018-11-20 MED ORDER — MEPERIDINE HCL 25 MG/ML IJ SOLN
6.2500 mg | INTRAMUSCULAR | Status: DC | PRN
  Filled 2018-11-20: qty 1

## 2018-11-20 MED ORDER — OXYCODONE HCL 5 MG/5ML PO SOLN
5.0000 mg | Freq: Once | ORAL | Status: DC | PRN
  Filled 2018-11-20: qty 5

## 2018-11-20 MED ORDER — LIDOCAINE HCL (CARDIAC) PF 100 MG/5ML IV SOSY
PREFILLED_SYRINGE | INTRAVENOUS | Status: DC | PRN
  Administered 2018-11-20: 60 mg via INTRAVENOUS

## 2018-11-20 MED ORDER — ACETAMINOPHEN 160 MG/5ML PO SOLN
325.0000 mg | ORAL | Status: DC | PRN
  Filled 2018-11-20: qty 20.3

## 2018-11-20 MED ORDER — PROPOFOL 10 MG/ML IV BOLUS
INTRAVENOUS | Status: DC | PRN
  Administered 2018-11-20: 50 mg via INTRAVENOUS
  Administered 2018-11-20: 150 mg via INTRAVENOUS
  Administered 2018-11-20 (×3): 50 mg via INTRAVENOUS

## 2018-11-20 MED ORDER — LIDOCAINE 2% (20 MG/ML) 5 ML SYRINGE
INTRAMUSCULAR | Status: AC
  Filled 2018-11-20: qty 5

## 2018-11-20 MED ORDER — FENTANYL CITRATE (PF) 100 MCG/2ML IJ SOLN
INTRAMUSCULAR | Status: AC
  Filled 2018-11-20: qty 2

## 2018-11-20 MED ORDER — ONDANSETRON HCL 4 MG/2ML IJ SOLN
4.0000 mg | Freq: Once | INTRAMUSCULAR | Status: DC | PRN
  Filled 2018-11-20: qty 2

## 2018-11-20 MED ORDER — ACETAMINOPHEN 325 MG PO TABS
325.0000 mg | ORAL_TABLET | ORAL | Status: DC | PRN
  Filled 2018-11-20: qty 2

## 2018-11-20 MED ORDER — DEXAMETHASONE SODIUM PHOSPHATE 10 MG/ML IJ SOLN
INTRAMUSCULAR | Status: DC | PRN
  Administered 2018-11-20: 10 mg via INTRAVENOUS

## 2018-11-20 MED ORDER — DEXAMETHASONE SODIUM PHOSPHATE 10 MG/ML IJ SOLN
INTRAMUSCULAR | Status: AC
  Filled 2018-11-20: qty 1

## 2018-11-20 MED ORDER — PROPOFOL 10 MG/ML IV BOLUS
INTRAVENOUS | Status: AC
  Filled 2018-11-20: qty 20

## 2018-11-20 MED ORDER — MIDAZOLAM HCL 2 MG/2ML IJ SOLN
INTRAMUSCULAR | Status: AC
  Filled 2018-11-20: qty 2

## 2018-11-20 MED ORDER — ONDANSETRON HCL 4 MG/2ML IJ SOLN
INTRAMUSCULAR | Status: DC | PRN
  Administered 2018-11-20: 4 mg via INTRAVENOUS

## 2018-11-20 MED ORDER — CEPHALEXIN 500 MG PO CAPS: 500.0000 mg | ORAL_CAPSULE | Freq: Two times a day (BID) | ORAL | 0 refills | Status: AC

## 2018-11-20 MED ORDER — FENTANYL CITRATE (PF) 100 MCG/2ML IJ SOLN
INTRAMUSCULAR | Status: DC | PRN
  Administered 2018-11-20 (×3): 25 ug via INTRAVENOUS
  Administered 2018-11-20: 50 ug via INTRAVENOUS
  Administered 2018-11-20 (×3): 25 ug via INTRAVENOUS

## 2018-11-20 MED ORDER — OXYCODONE HCL 5 MG PO TABS
5.0000 mg | ORAL_TABLET | Freq: Once | ORAL | Status: DC | PRN
  Filled 2018-11-20: qty 1

## 2018-11-20 MED ORDER — FENTANYL CITRATE (PF) 100 MCG/2ML IJ SOLN
25.0000 ug | INTRAMUSCULAR | Status: DC | PRN
  Filled 2018-11-20: qty 1

## 2018-11-20 MED ORDER — OXYBUTYNIN CHLORIDE 5 MG PO TABS: 5.0000 mg | ORAL_TABLET | Freq: Three times a day (TID) | ORAL | 1 refills | Status: DC | PRN

## 2018-11-20 MED ORDER — CIPROFLOXACIN IN D5W 400 MG/200ML IV SOLN
400.0000 mg | Freq: Once | INTRAVENOUS | Status: AC
  Administered 2018-11-20: 400 mg via INTRAVENOUS
  Filled 2018-11-20: qty 200

## 2018-11-20 SURGICAL SUPPLY — 28 items
BAG DRAIN URO-CYSTO SKYTR STRL (DRAIN) ×3 IMPLANT
BAG URINE DRAINAGE (UROLOGICAL SUPPLIES) ×3 IMPLANT
CATH FOLEY 3WAY 30CC 22FR (CATHETERS) ×2 IMPLANT
CATH FOLEY 3WAY 30CC 24FR (CATHETERS)
CATH ROBINSON RED A/P 14FR (CATHETERS) IMPLANT
CATH URTH STD 24FR FL 3W 2 (CATHETERS) IMPLANT
CLOTH BEACON ORANGE TIMEOUT ST (SAFETY) ×3 IMPLANT
ELECT REM PT RETURN 9FT ADLT (ELECTROSURGICAL) ×3
ELECTRODE REM PT RTRN 9FT ADLT (ELECTROSURGICAL) ×1 IMPLANT
GLOVE BIO SURGEON STRL SZ7.5 (GLOVE) ×3 IMPLANT
GOWN STRL REUS W/ TWL XL LVL3 (GOWN DISPOSABLE) ×3 IMPLANT
GOWN STRL REUS W/TWL XL LVL3 (GOWN DISPOSABLE) ×9 IMPLANT
HOLDER FOLEY CATH W/STRAP (MISCELLANEOUS) IMPLANT
IV NS IRRIG 3000ML ARTHROMATIC (IV SOLUTION) ×12 IMPLANT
KIT TURNOVER CYSTO (KITS) ×3 IMPLANT
LOOP CUT BIPOLAR 24F LRG (ELECTROSURGICAL) IMPLANT
MANIFOLD NEPTUNE II (INSTRUMENTS) ×3 IMPLANT
NDL HYPO 18GX1.5 BLUNT FILL (NEEDLE) IMPLANT
NEEDLE HYPO 18GX1.5 BLUNT FILL (NEEDLE) IMPLANT
PACK CYSTO (CUSTOM PROCEDURE TRAY) ×3 IMPLANT
PIN SAFETY STERILE (MISCELLANEOUS) ×3 IMPLANT
RUBBERBAND STERILE (MISCELLANEOUS) IMPLANT
SYR 20CC LL (SYRINGE) IMPLANT
SYRINGE IRR TOOMEY STRL 70CC (SYRINGE) ×3 IMPLANT
TUBE CONNECTING 12'X1/4 (SUCTIONS) ×1
TUBE CONNECTING 12X1/4 (SUCTIONS) ×2 IMPLANT
TUBING UROLOGY SET (TUBING) ×2 IMPLANT
WATER STERILE IRR 3000ML UROMA (IV SOLUTION) ×3 IMPLANT

## 2018-11-20 NOTE — Transfer of Care (Signed)
Immediate Anesthesia Transfer of Care Note  Patient: Bruce Sullivan  Procedure(s) Performed: Procedure(s) (LRB): TRANSURETHRAL RESECTION OF THE PROSTATE (TURP) (N/A) CYSTOSCOPY WITH BLADDER BIOPSY (N/A)  Patient Location: PACU  Anesthesia Type: General  Level of Consciousness: awake, sedated, patient cooperative and responds to stimulation  Airway & Oxygen Therapy: Patient Spontanous Breathing and Patient connected to [place] oxygen  Post-op Assessment: Report given to PACU RN, Post -op Vital signs reviewed and stable and Patient moving all extremities  Post vital signs: Reviewed and stable  Complications: No apparent anesthesia complications

## 2018-11-20 NOTE — Op Note (Signed)
Operative Note  Preoperative diagnosis:  1.  BPH with bladder outlet obstruction 2.  2 cm posterior bladder wall lesion  Postoperative diagnosis: 1.  BPH with bladder outlet obstruction 2.  2 cm posterior bladder wall lesionobstruction  Procedure(s): 1.  Bipolar TURP 2.  TURBT (medium)  Surgeon: Ellison Hughs, MD  Assistants:  None  Anesthesia:  General  Complications:  None  EBL:  50 mL  Specimens: 1. Prostate chips 2.  Superficial and deep posterior bladder wall lesion  Drains/Catheters: 1.  22 French three-way Foley catheter with 30 mL in the balloon  Intraoperative findings:   1. Bilobar prostatic urethral obstruction 2. 2 cm slightly raised and erythematous papillary bladder lesion involving the posterior bladder wall 3. Bladder dome diverticulum with no luminal lesions   Indication:  Bruce Sullivan is a 68 y.o. male with ongoing urinary retention due to bladder outlet obstruction that has necessitated an indwelling Foley catheter.  He recently underwent a cystoscopy and was found to have bilobar prostatic urethral obstruction as well as a papillary lesion involving the posterior bladder wall.  He has been consented for the above procedures, voices understanding and wishes to proceed.   Description of procedure:  After informed consent was obtained, the patient was brought to the operating room and general anesthesia was administered. The patient was then placed in the dorsolithotomy position and prepped and draped in usual sterile fashion. A timeout was performed. A 23 French rigid cystoscope was then inserted into the urethral meatus and advanced into the bladder under direct vision. A complete bladder survey revealed revealed the findings listed above.    The rigid cystoscope was then exchanged for a 26 French resectoscope with a bipolar loop working element.  The posterior bladder wall lesion was then resected superficially and then a deep margin was taken down  to the detrusor muscle.  The superficial and deep specimens were sent separately.  Starting at the bladder neck and progressing distally to the verumontanum, the prostatic adenoma was systematically resected until a widely patent prostatic urethral channel was created.  All prostate chips were then hand irrigated out of the bladder and sent to pathology for permanent section.  The resectoscope was then removed and exchanged for a 22 French three-way Foley catheter.  The three-way Foley catheter was then extensively hand irrigated until the irrigant returned clear to light pink.  The catheter was then placed to continuous bladder irrigation and placed on rubber band traction.  He tolerated the procedure well and was transferred to the postanesthesia unit in stable condition.  Plan:  CBI in the PACU.  Home with Foley catheter.

## 2018-11-20 NOTE — Anesthesia Postprocedure Evaluation (Signed)
Anesthesia Post Note  Patient: Bruce Sullivan  Procedure(s) Performed: TRANSURETHRAL RESECTION OF THE PROSTATE (TURP) (N/A ) CYSTOSCOPY WITH BLADDER BIOPSY (N/A )     Patient location during evaluation: PACU Anesthesia Type: General Level of consciousness: awake and alert Pain management: pain level controlled Vital Signs Assessment: post-procedure vital signs reviewed and stable Respiratory status: spontaneous breathing, nonlabored ventilation, respiratory function stable and patient connected to nasal cannula oxygen Cardiovascular status: blood pressure returned to baseline and stable Postop Assessment: no apparent nausea or vomiting Anesthetic complications: no    Last Vitals:  Vitals:   11/20/18 0930 11/20/18 0945  BP: (!) 139/92 (!) 141/83  Pulse: 80 84  Resp: 12 19  Temp:    SpO2: 94% 97%    Last Pain:  Vitals:   11/20/18 0925  TempSrc:   PainSc: Asleep                 Oday Ridings

## 2018-11-20 NOTE — Anesthesia Procedure Notes (Signed)
Procedure Name: LMA Insertion Date/Time: 11/20/2018 8:29 AM Performed by: Justice Rocher, CRNA Pre-anesthesia Checklist: Patient identified, Emergency Drugs available, Suction available and Patient being monitored Patient Re-evaluated:Patient Re-evaluated prior to induction Oxygen Delivery Method: Circle system utilized Preoxygenation: Pre-oxygenation with 100% oxygen Induction Type: IV induction Ventilation: Mask ventilation without difficulty LMA: LMA inserted LMA Size: 4.0 Number of attempts: 1 Airway Equipment and Method: Bite block Placement Confirmation: positive ETCO2 and breath sounds checked- equal and bilateral Tube secured with: Tape Dental Injury: Teeth and Oropharynx as per pre-operative assessment

## 2018-11-20 NOTE — H&P (Signed)
Urology Preoperative H&P   Chief Complaint: Urinary retention  History of Present Illness: Bruce Sullivan is a 68 y.o. male who was seen in the emergency department on 09/20/18 for a suspected urinary tract infection as well as urinary retention. He was started on Cipro and had a Foley catheter placed at that time (900 mL drained). He has since failed 3 voiding trials despite being on tamsulosin BID.   Last PSA- 3.7 (10/2018)--No personal/family history of prostate cancer. DRE is benign.   Cystoscopy in the office revealed bi-lobar prostatic urethral obstruction a small papillary bladder lesion and a bladder diverticulum at the dome.   Past Medical History:  Diagnosis Date  . Anxiety   . Asthma   . Atherosclerosis 01/16/2015   Noted on CXR  . Bilateral lower extremity edema 01/16/2015  . Chronic kidney disease (CKD), stage II (mild)   . COPD (chronic obstructive pulmonary disease) ([place])   . Depression   . GERD (gastroesophageal reflux disease)   . Gout   . History of colon polyps   . Hoarseness   . Hyperlipidemia   . Hypertension   . Neck abscess 09/03/2016  . Pulmonary emphysema ([place])   . Seizure ([place])   . Stroke ([place]) 2014  . Thyroid disease   . TIA (transient ischemic attack)   . Urinary frequency   . Urine retention   . UTI (urinary tract infection)     Past Surgical History:  Procedure Laterality Date  . COLONOSCOPY  08/29/2018  . HERNIA REPAIR    . t monitor      Allergies: No Known Allergies  History reviewed. No pertinent family history.  Social History:  reports that he has quit smoking. He has never used smokeless tobacco. He reports that he does not drink alcohol or use drugs.  ROS: A complete review of systems was performed.  All systems are negative except for pertinent findings as noted.  Physical Exam:  Vital signs in last 24 hours: Temp:  [99.1 F (37.3 C)] 99.1 F (37.3 C) (01/15 0620) Pulse Rate:  [87] 87 (01/15 0620) Resp:  [16] 16 (01/15  0620) BP: (134)/(82) 134/82 (01/15 0620) SpO2:  [98 %] 98 % (01/15 0620) Weight:  [82.9 kg] 82.9 kg (01/15 0620) Constitutional:  Alert and oriented, No acute distress Cardiovascular: Regular rate and rhythm, No JVD Respiratory: Normal respiratory effort, Lungs clear bilaterally GI: Abdomen is soft, nontender, nondistended, no abdominal masses GU: No CVA tenderness Lymphatic: No lymphadenopathy Neurologic: Grossly intact, no focal deficits Psychiatric: Normal mood and affect  Laboratory Data:  No results for input(s): WBC, HGB, HCT, PLT in the last 72 hours.  No results for input(s): NA, K, CL, GLUCOSE, BUN, CALCIUM, CREATININE in the last 72 hours.  Invalid input(s): CO3   No results found for this or any previous visit (from the past 24 hour(s)). No results found for this or any previous visit (from the past 240 hour(s)).  Renal Function: No results for input(s): CREATININE in the last 168 hours. CrCl cannot be calculated (Patient's most recent lab result is older than the maximum 21 days allowed.).  Radiologic Imaging: No results found.  I independently reviewed the above imaging studies.  Assessment and Plan Bruce Sullivan is a 68 y.o. male with urinary retention 2/2 BPH with bladder outlet obstruction and a small bladder lesion concerning for UCC  -The risks, benefits and alternatives of cystoscopy with TURP and bladder biopsy was discussed with the patient. The risks included, but  are not limited to, bleeding, urinary tract infection, bladder perforation requiring prolonged catheterization and/or open bladder repair, ureteral injury, ureteral obstruction, urethral stricture disease, new or worsening voiding dysfunction, retrograde ejaculation, MI, CVA, PE, DVT and the inherent risks of general anesthesia. We also discussed the need for Foley catheterization for at least 3 days post-op and the likely need for post-op observation in the hospital following the procedure. The  patient voices understanding and wishes to proceed.   Ellison Hughs, MD 11/20/2018, 6:53 AM  [facility] Pager: [phone]

## 2018-11-20 NOTE — Discharge Instructions (Signed)

## 2018-11-21 ENCOUNTER — Encounter (HOSPITAL_BASED_OUTPATIENT_CLINIC_OR_DEPARTMENT_OTHER): Payer: Self-pay | Admitting: Urology

## 2018-12-31 LAB — COMPREHENSIVE METABOLIC PANEL
ALT: 15 U/L (ref 0–41)
AST: 17 U/L (ref 0–40)
Albumin/Globulin Ratio: 2 mmol/L (ref 1.00–2.00)
Albumin: 4.6 g/dL (ref 3.5–5.2)
Alk Phosphatase: 73 U/L (ref 40–130)
Anion Gap: 11 mmol/L (ref 2–17)
BUN: 18 mg/dL (ref 8–23)
CO2: 26 mmol/L (ref 22–29)
Calcium: 9.3 mg/dL (ref 8.8–10.2)
Chloride: 104 mmol/L (ref 98–107)
Creatinine: 0.8 mg/dL (ref 0.7–1.3)
GFR African American: 107 mL/min/{1.73_m2} (ref >=90)
GFR Non-African American: 92 mL/min/{1.73_m2} (ref >=90)
Globulin: 2 g/dL (ref 1.9–4.4)
Glucose: 103 mg/dL — ABNORMAL HIGH (ref 70–99)
OSMOLALITY CALCULATED: 283 mOsm/kg (ref 270–287)
Potassium: 4.7 mmol/L (ref 3.5–5.3)
Sodium: 141 mmol/L (ref 135–145)
Total Bilirubin: 0.2 mg/dL (ref 0.00–1.20)
Total Protein: 6.9 g/dL (ref 6.4–8.3)

## 2018-12-31 LAB — LIPID PANEL
Chol/HDL Ratio: 4.8 — ABNORMAL HIGH (ref 0.0–4.4)
Cholesterol: 219 mg/dL — ABNORMAL HIGH (ref 100–200)
HDL: 46 mg/dL — ABNORMAL LOW (ref 55–72)
LDL Cholesterol: 152.2 mg/dL — ABNORMAL HIGH (ref 0.0–100.0)
LDL/HDL Ratio: 3.3
Triglycerides: 104 mg/dL (ref 0–149)
VLDL: 20.8 mg/dL (ref 5.0–40.0)

## 2018-12-31 LAB — VITAMIN B12: Vitamin B-12: 309 pg/mL (ref 232–1245)

## 2018-12-31 LAB — TSH: TSH, 3RD GENERATION: 3.44 mcIU/mL (ref 0.358–3.740)

## 2018-12-31 LAB — T4, FREE: T4 Free: 1.37 ng/dL (ref 0.82–1.70)

## 2019-06-27 LAB — LIPID PANEL
Chol/HDL Ratio: 4.6 — ABNORMAL HIGH (ref 0.0–4.4)
Cholesterol: 222 mg/dL — ABNORMAL HIGH (ref 100–200)
HDL: 48 mg/dL — ABNORMAL LOW (ref 55–72)
LDL Cholesterol: 153 mg/dL — ABNORMAL HIGH (ref 0.0–100.0)
LDL/HDL Ratio: 3.2
Triglycerides: 103 mg/dL (ref 0–149)
VLDL: 20.6 mg/dL (ref 5.0–40.0)

## 2019-06-27 LAB — COMPREHENSIVE METABOLIC PANEL
ALT: 15 U/L (ref 0–41)
AST: 17 U/L (ref 0–40)
Albumin/Globulin Ratio: 2 mmol/L (ref 1.00–2.00)
Albumin: 4.7 g/dL (ref 3.5–5.2)
Alk Phosphatase: 65 U/L (ref 40–130)
Anion Gap: 9 mmol/L (ref 2–17)
BUN: 18 mg/dL (ref 8–23)
CO2: 26 mmol/L (ref 22–29)
Calcium: 9.7 mg/dL (ref 8.8–10.2)
Chloride: 104 mmol/L (ref 98–107)
Creatinine: 0.7 mg/dL (ref 0.7–1.3)
GFR African American: 113 mL/min/{1.73_m2} (ref >=90)
GFR Non-African American: 98 mL/min/{1.73_m2} (ref >=90)
Globulin: 2 g/dL (ref 1.9–4.4)
Glucose: 94 mg/dL (ref 70–99)
OSMOLALITY CALCULATED: 279 mOsm/kg (ref 270–287)
Potassium: 5.1 mmol/L (ref 3.5–5.3)
Sodium: 139 mmol/L (ref 135–145)
Total Bilirubin: 0.5 mg/dL (ref 0.00–1.20)
Total Protein: 7.1 g/dL (ref 6.4–8.3)

## 2019-06-27 LAB — HEMOGLOBIN A1C
Est. Avg. Glucose, WB: 128
Est. Avg. Glucose-calculated: 140
Hemoglobin A1C: 6.1 % — ABNORMAL HIGH (ref 4.0–6.0)

## 2019-07-02 LAB — VITAMIN B12: Vitamin B-12: 428 pg/mL (ref 232–1245)

## 2019-11-14 LAB — COMPREHENSIVE METABOLIC PANEL
ALT: 12 U/L (ref 0–41)
AST: 15 U/L (ref 0–40)
Albumin/Globulin Ratio: 2 mmol/L (ref 1.00–2.00)
Albumin: 4.6 g/dL (ref 3.5–5.2)
Alk Phosphatase: 83 U/L (ref 40–130)
Anion Gap: 9 mmol/L (ref 2–17)
BUN: 19 mg/dL (ref 8–23)
CO2: 25 mmol/L (ref 22–29)
Calcium: 9 mg/dL (ref 8.8–10.2)
Chloride: 105 mmol/L (ref 98–107)
Creatinine: 0.8 mg/dL (ref 0.7–1.3)
GFR African American: 106 mL/min/{1.73_m2} (ref >=90)
GFR Non-African American: 92 mL/min/{1.73_m2} (ref >=90)
Globulin: 2 g/dL (ref 1.9–4.4)
Glucose: 97 mg/dL (ref 70–99)
OSMOLALITY CALCULATED: 280 mOsm/kg (ref 270–287)
Potassium: 4.5 mmol/L (ref 3.5–5.3)
Sodium: 139 mmol/L (ref 135–145)
Total Bilirubin: 0.3 mg/dL (ref 0.00–1.20)
Total Protein: 6.9 g/dL (ref 6.4–8.3)

## 2019-11-14 LAB — LIPID PANEL
Chol/HDL Ratio: 4.8 — ABNORMAL HIGH (ref 0.0–4.4)
Cholesterol: 184 mg/dL (ref 100–200)
HDL: 38 mg/dL — ABNORMAL LOW (ref 55–72)
LDL Cholesterol: 125 mg/dL — ABNORMAL HIGH (ref 0.0–100.0)
LDL/HDL Ratio: 3.3
Triglycerides: 105 mg/dL (ref 0–149)
VLDL: 21 mg/dL (ref 5.0–40.0)

## 2019-11-14 LAB — CBC
Hematocrit: 28.9 % — ABNORMAL LOW (ref 38.0–52.0)
Hemoglobin: 8.4 g/dL — ABNORMAL LOW (ref 13.0–17.3)
MCH: 22.9 pg — ABNORMAL LOW (ref 27.0–34.5)
MCHC: 29.1 g/dL — ABNORMAL LOW (ref 32.0–36.0)
MCV: 78.7 fL — ABNORMAL LOW (ref 84.0–100.0)
MPV: 11.1 fL (ref 7.2–13.2)
NRBC Absolute: 0 10*3/uL (ref 0.000–0.012)
NRBC Automated: 0 % (ref 0.0–0.2)
Platelets: 281 10*3/uL (ref 140–440)
RBC: 3.67 x10e6/mcL — ABNORMAL LOW (ref 4.00–5.60)
RDW: 15.8 % (ref 11.0–16.0)
WBC: 3.4 10*3/uL — ABNORMAL LOW (ref 3.8–10.6)

## 2019-11-14 LAB — HEMOGLOBIN A1C
Est. Avg. Glucose, WB: 120
Est. Avg. Glucose-calculated: 129
Hemoglobin A1C: 5.8 % (ref 4.0–6.0)

## 2019-11-14 LAB — FERRITIN: Ferritin: 8.3 ng/mL — ABNORMAL LOW (ref 30.0–400.0)

## 2019-11-14 LAB — VITAMIN B12: Vitamin B-12: 552 pg/mL (ref 232–1245)

## 2019-11-14 LAB — PROSTATE SPECIFIC ANTIGEN, TOTAL: PSA: 1.78 ng/mL (ref 0.000–4.000)

## 2019-11-17 LAB — T4, FREE: T4 Free: 1.82 ng/dL — ABNORMAL HIGH (ref 0.82–1.70)

## 2019-11-17 LAB — TSH: TSH, 3RD GENERATION: 0.51 mcIU/mL (ref 0.358–3.740)

## 2019-12-08 DIAGNOSIS — K759 Inflammatory liver disease, unspecified: Secondary | ICD-10-CM

## 2019-12-08 HISTORY — DX: Inflammatory liver disease, unspecified: K75.9

## 2019-12-10 ENCOUNTER — Other Ambulatory Visit: Payer: Self-pay | Admitting: Internal Medicine

## 2019-12-10 DIAGNOSIS — G40909 Epilepsy, unspecified, not intractable, without status epilepticus: Secondary | ICD-10-CM | POA: Diagnosis not present

## 2019-12-10 DIAGNOSIS — F324 Major depressive disorder, single episode, in partial remission: Secondary | ICD-10-CM | POA: Diagnosis not present

## 2019-12-10 DIAGNOSIS — Z1389 Encounter for screening for other disorder: Secondary | ICD-10-CM | POA: Diagnosis not present

## 2019-12-10 DIAGNOSIS — R109 Unspecified abdominal pain: Secondary | ICD-10-CM | POA: Diagnosis not present

## 2019-12-10 DIAGNOSIS — G459 Transient cerebral ischemic attack, unspecified: Secondary | ICD-10-CM | POA: Diagnosis not present

## 2019-12-10 DIAGNOSIS — F1021 Alcohol dependence, in remission: Secondary | ICD-10-CM | POA: Diagnosis not present

## 2019-12-10 DIAGNOSIS — K219 Gastro-esophageal reflux disease without esophagitis: Secondary | ICD-10-CM | POA: Diagnosis not present

## 2019-12-10 DIAGNOSIS — J449 Chronic obstructive pulmonary disease, unspecified: Secondary | ICD-10-CM | POA: Diagnosis not present

## 2019-12-10 DIAGNOSIS — I10 Essential (primary) hypertension: Secondary | ICD-10-CM | POA: Diagnosis not present

## 2019-12-17 ENCOUNTER — Ambulatory Visit
Admission: RE | Admit: 2019-12-17 | Discharge: 2019-12-17 | Disposition: A | Discharge status: Home / Self Care | Payer: Medicare HMO | Source: Ambulatory Visit | Attending: Internal Medicine | Admitting: Internal Medicine

## 2019-12-17 DIAGNOSIS — N281 Cyst of kidney, acquired: Secondary | ICD-10-CM | POA: Diagnosis not present

## 2019-12-17 DIAGNOSIS — K760 Fatty (change of) liver, not elsewhere classified: Secondary | ICD-10-CM | POA: Diagnosis not present

## 2019-12-17 DIAGNOSIS — R109 Unspecified abdominal pain: Secondary | ICD-10-CM

## 2019-12-17 IMAGING — US US ABDOMEN COMPLETE
1 series · 13 of 25 positions shown · non-contrast
Comparison: [DATE]

CLINICAL DATA: Abdominal discomfort.

EXAM:
ABDOMEN ULTRASOUND COMPLETE

[Series 1: us abdomen complete · 0.19mm/px · 13 of 75 slices shown]
[im 1/75]
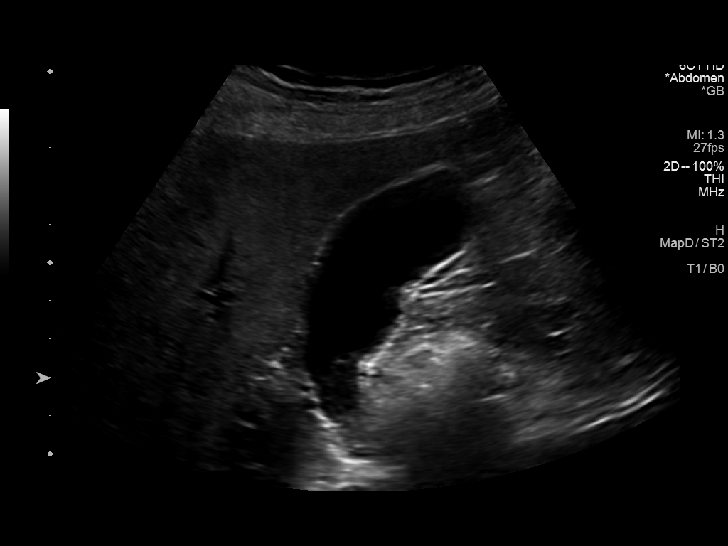
[im 7/75]
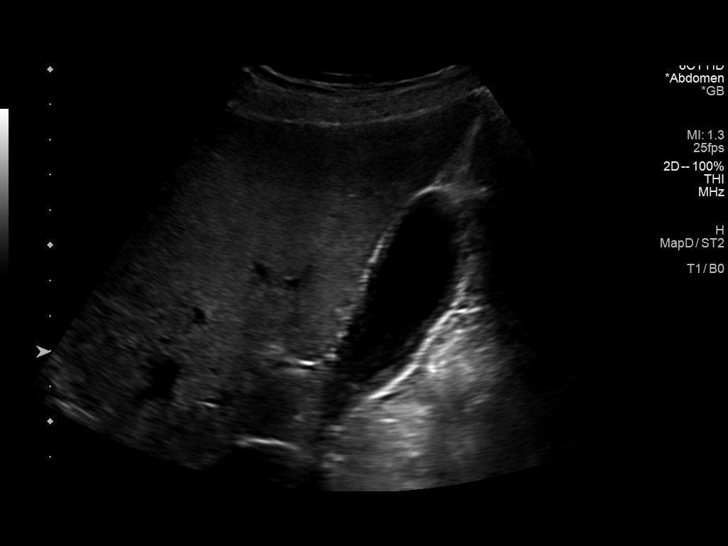
[im 13/75]
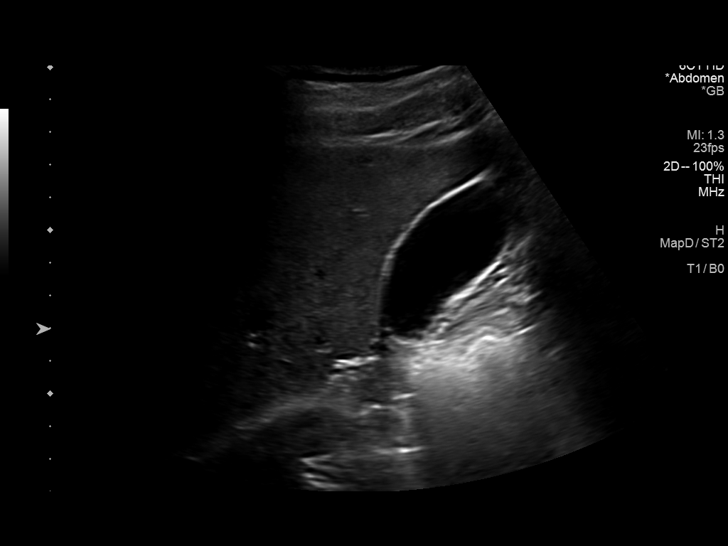
[im 19/75]
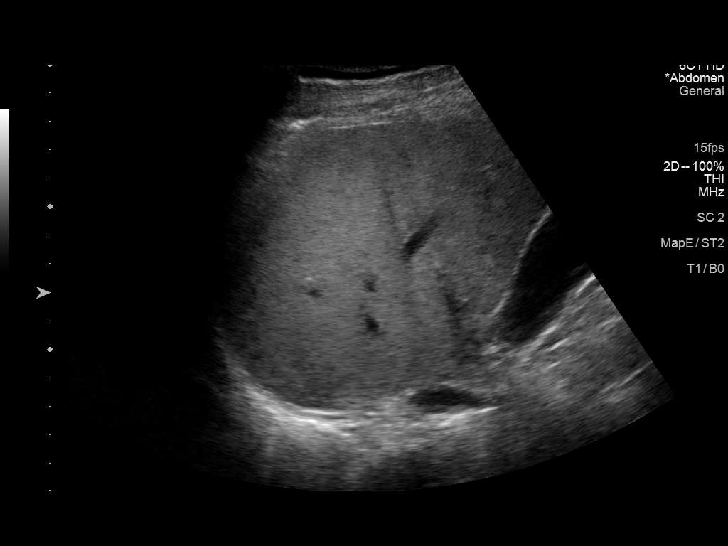
[im 25/75]
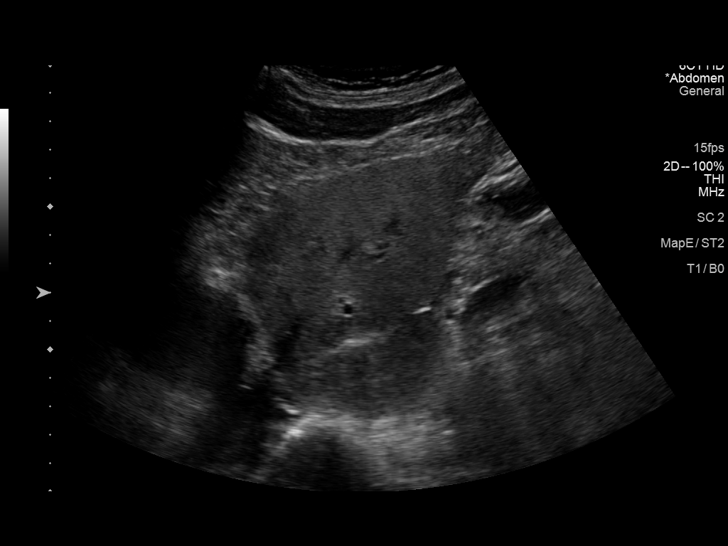
[im 31/75]
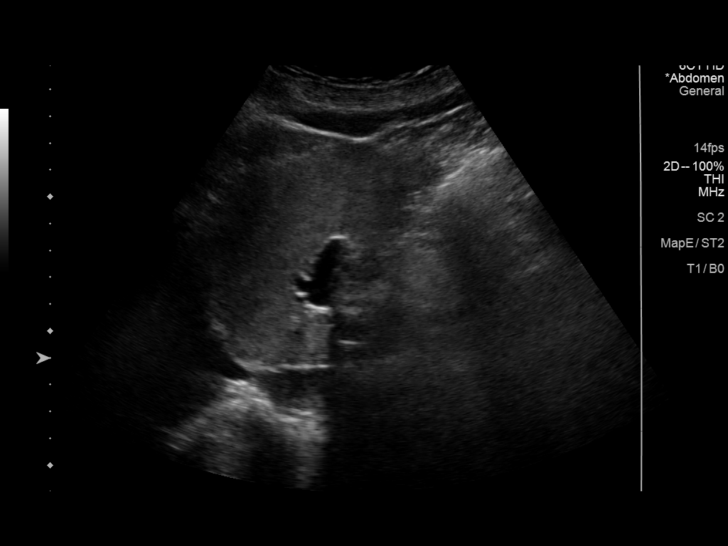
[im 38/75]
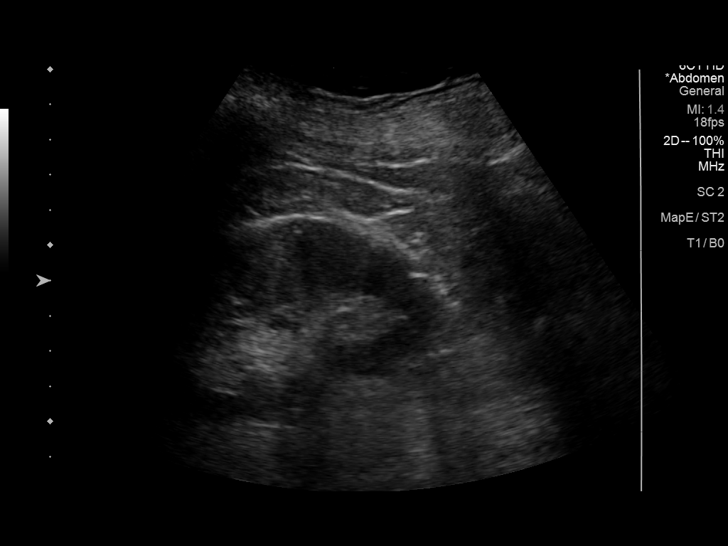
[im 44/75]
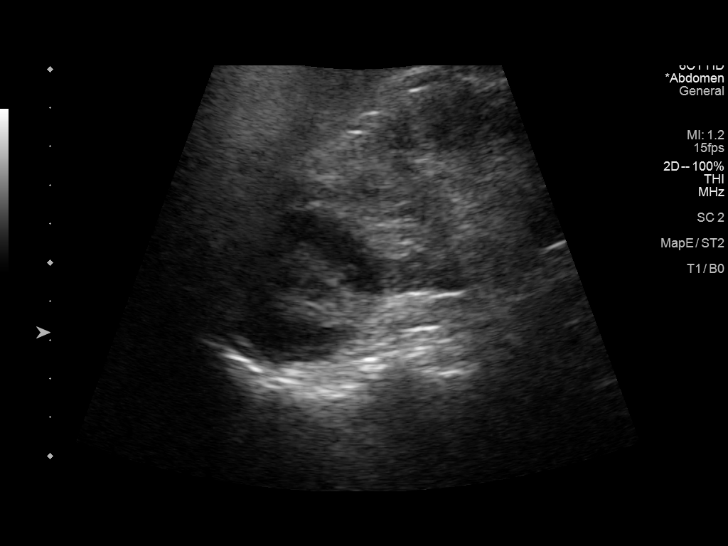
[im 50/75]
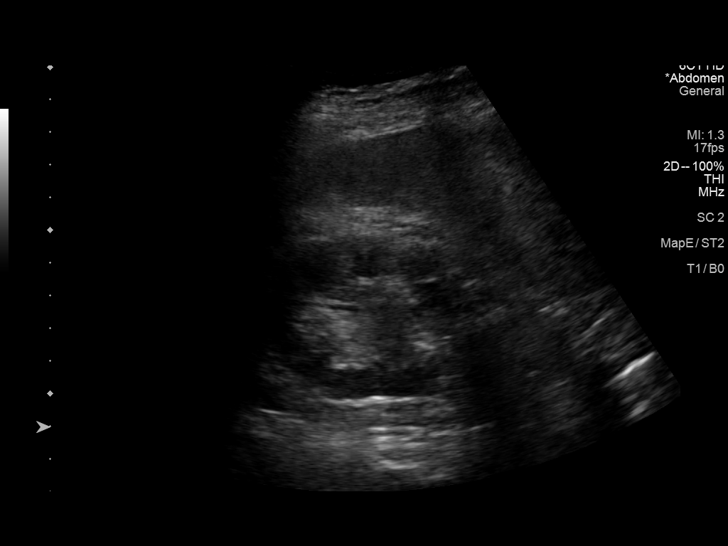
[im 56/75]
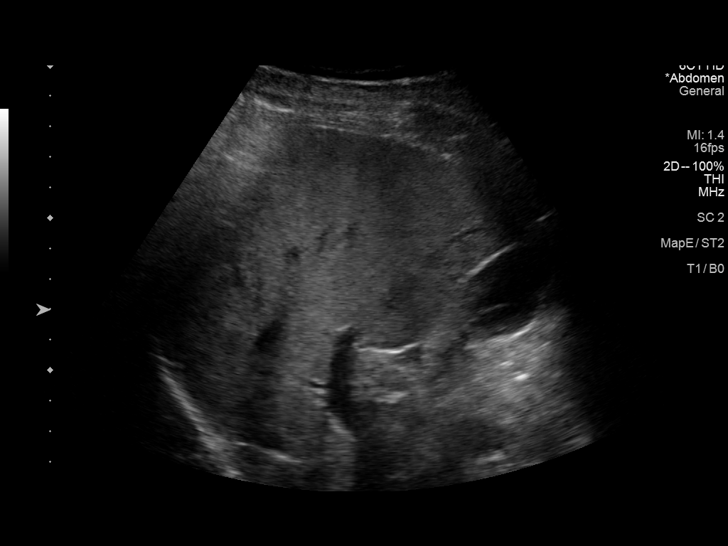
[im 62/75]
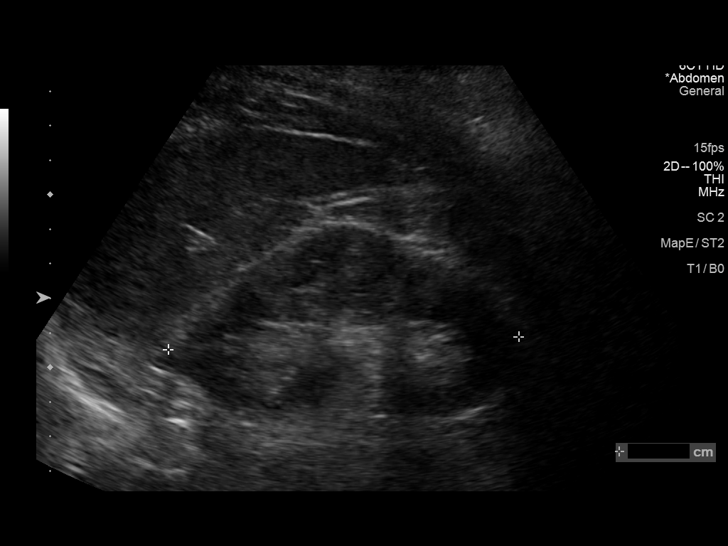
[im 68/75]
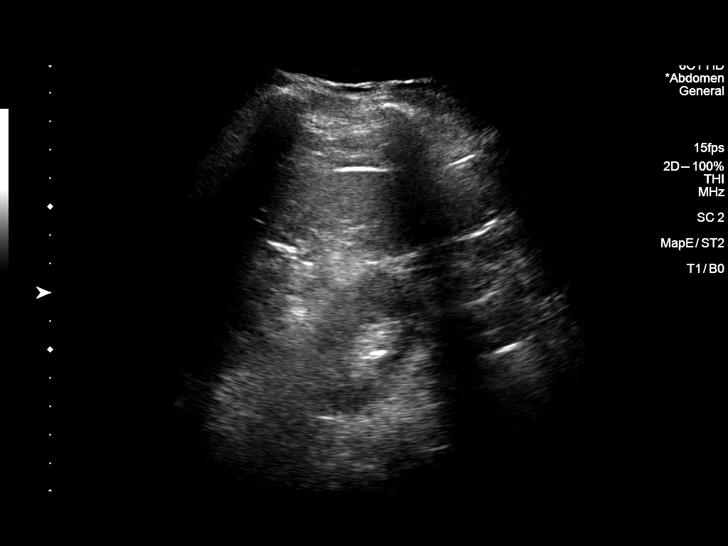
[im 75/75]
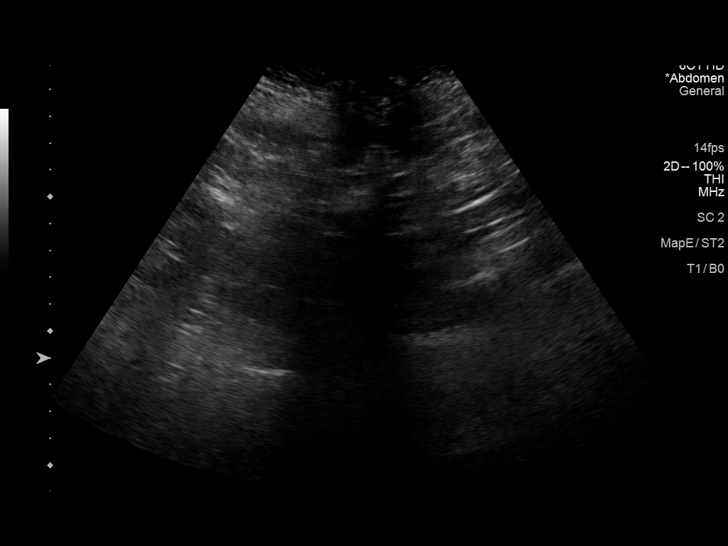

[13 of 25 positions shown; findings below may reference images not displayed]

FINDINGS: Gallbladder: No gallstones or wall thickening visualized. No
sonographic Murphy sign noted by sonographer.

Common bile duct: Diameter: 6.6 mm

Liver: Diffuse increased echogenicity suggesting fatty infiltration.
No focal hepatic lesions are identified. No intrahepatic biliary
dilatation. Portal vein is patent on color Doppler imaging with
normal direction of blood flow towards the liver.

IVC: Poorly visualized.

Pancreas: Poorly visualized.

Spleen: Normal size.  No focal lesions.

Right Kidney: Length: 10.3 cm. Normal renal cortical thickness and
echogenicity. There is a complex exophytic cystic and solid
appearing lesion involving the upper pole. This measures
approximately 4.3 x 4.3 x 3.9 cm. It could be a renal or
suprarenal/adrenal lesion. Difficult to be certain.

Simple appearing 1.9 x 1.7 x 1.7 cm cyst in the upper pole region
laterally.

Left Kidney: Length: 10.2 cm. Normal renal cortical thickness and
echogenicity without focal lesions or hydronephrosis.

Abdominal aorta: Normal caliber

Other findings: None.
IMPRESSION: 1. 4 cm complex cystic and solid appearing exophytic upper pole
right renal lesion. Recommend MRI abdomen without and with contrast
for further evaluation.
2. Diffuse fatty infiltration of the liver.
3. Normal gallbladder and normal caliber common bile duct.
4. Poor visualization of the IVC and pancreas and parts of the
aorta.

## 2019-12-18 ENCOUNTER — Encounter: Payer: Self-pay | Admitting: Infectious Diseases

## 2019-12-18 ENCOUNTER — Telehealth: Payer: Self-pay | Admitting: Pharmacy Technician

## 2019-12-18 ENCOUNTER — Ambulatory Visit (INDEPENDENT_AMBULATORY_CARE_PROVIDER_SITE_OTHER): Payer: Medicare HMO | Admitting: Infectious Diseases

## 2019-12-18 ENCOUNTER — Other Ambulatory Visit: Payer: Self-pay

## 2019-12-18 VITALS — BP 155/90 | HR 80 | Temp 98.0°F | Ht 71.5 in | Wt 197.0 lb

## 2019-12-18 DIAGNOSIS — R768 Other specified abnormal immunological findings in serum: Secondary | ICD-10-CM

## 2019-12-18 DIAGNOSIS — N400 Enlarged prostate without lower urinary tract symptoms: Secondary | ICD-10-CM | POA: Insufficient documentation

## 2019-12-18 DIAGNOSIS — G40909 Epilepsy, unspecified, not intractable, without status epilepticus: Secondary | ICD-10-CM

## 2019-12-18 DIAGNOSIS — R140 Abdominal distension (gaseous): Secondary | ICD-10-CM | POA: Diagnosis not present

## 2019-12-18 DIAGNOSIS — Z8673 Personal history of transient ischemic attack (TIA), and cerebral infarction without residual deficits: Secondary | ICD-10-CM | POA: Diagnosis not present

## 2019-12-18 DIAGNOSIS — F1021 Alcohol dependence, in remission: Secondary | ICD-10-CM | POA: Diagnosis not present

## 2019-12-18 NOTE — Assessment & Plan Note (Signed)
No findings concerning for ascites / decompensated cirrhosis. Abdominal ultrasound does reveal a cystic/partilaly solid appearing mass on right kidney desiring further MRI characterization. I discussed with them today that they will need to follow up with Dr. Lysle Rubens regarding next steps for this. I don't think either are contributing to his abdominal distension.  Unclear if he has had a colonoscopy - would consider for routine screening but does not sound to have had changes in BMs.  May be related to weight gain - he is up 30 lbs since November 2019.

## 2019-12-18 NOTE — Assessment & Plan Note (Signed)
On keppra - no interactions with [place]

## 2019-12-18 NOTE — Telephone Encounter (Signed)
[facility] Patient Advocate Encounter    Findings of the benefits investigation:   Insurance: [employer]- active medicare  Prior Authorization: will begin insurance process once medication is prescribed  I obtained the necessary demographic and contact information. Once prior authorization process is approved, we will apply to get [employer] coverage for copayment. Household size 2 adults, $2168/monthly will qualify the patient to get the medication at no cost. It will be filled at [facility] and pending shipping/pick up determination.   Bartholomew Crews, CPhT Specialty Pharmacy [facility] for Infectious Disease Phone: [phone] Fax: [phone] 12/18/2019 10:42 AM

## 2019-12-18 NOTE — Assessment & Plan Note (Signed)
On Lipitor 20 mg - safe with Epclusa without dose reduction.

## 2019-12-18 NOTE — Progress Notes (Signed)
Patient Name: Bruce Sullivan  Date of Birth: [birth year]  MRN: [id number]  PCP: Wenda Low, MD  Referring Provider: [facility], Ph#: [phone]   Patient Active Problem List   Diagnosis Date Noted  . Hepatitis C antibody positive in blood 12/18/2019  . History of alcohol dependence ([place]) 12/18/2019  . BPH (benign prostatic hyperplasia) 12/18/2019  . Abdominal distension 12/18/2019  . Current moderate episode of major depressive disorder without prior episode ([place]) 04/10/2017  . Stage 2 chronic kidney disease 04/10/2017  . Chronic obstructive pulmonary disease ([place]) 03/26/2017  . Essential hypertension 03/26/2017  . GAD (generalized anxiety disorder) 03/26/2017  . GERD (gastroesophageal reflux disease) 03/26/2017  . History of CVA (cerebrovascular accident) 03/26/2017  . Mixed hyperlipidemia 03/26/2017  . Seizure disorder ([place]) 03/26/2017    CC:  New patient - initial evaluation and management of hepatitis C positive Ab test     HPI/ROS:  NIALL Sullivan is a 69 y.o. male. He is here with his wife today.   He had screening with PCP recently and underwent Hep C Ab testing with positive result >11.0 titer on 12/12/2019.  Quit heavy alcohol use 30 years ago. Did have a distant history of injection and intranasal drug use as adolescent/early adult but none for many decades. Other risk factor for hepatitis C includes [place] vaccination.   Primary concern today is that his stomach is feeling hard. Not a pain per say just firm. No changes to BMs and described to be sausage like and occur daily without straining. Weight gain noted of about 50 lbs over the last 12-14 months per his wife's report. No trouble passing urine since TURP but planning follow up with his urologist to ensure that all is OK following procedure.   Other pertinent labs from records reviewed:  Creatinine 1.11 AST 10 ALT 9 Alk Phos 52 TBili 0.4 Albumin 4.7 PLT 289  He had an abdominal  ultrasound yesterday and have not heard about results.   Patient does not have documented immunity to Hepatitis A. Patient does not have documented immunity to Hepatitis B.    Review of Systems  Constitutional: negative for fevers, chills, fatigue, malaise, anorexia and weight loss Eyes: negative for icterus Respiratory: negative for cough or dyspnea on exertion Cardiovascular: negative for dyspnea, palpitations, orthopnea, lower extremity edema Gastrointestinal: negative for nausea, vomiting, change in bowel habits, diarrhea, constipation and jaundice Integument: negative for skin color change Hematologic/lymphatic: negative for easy bruising, bleeding and petechiae Neurological: negative for headaches, memory problems and gait problems Behavioral/Psych: negative for excessive alcohol consumption and illegal drug usage All other systems reviewed and are negative      Past Medical History:  Diagnosis Date  . Anxiety   . Asthma   . Atherosclerosis 01/16/2015   Noted on CXR  . Bilateral lower extremity edema 01/16/2015  . Chronic kidney disease (CKD), stage II (mild)   . COPD (chronic obstructive pulmonary disease) ([place])   . Depression   . GERD (gastroesophageal reflux disease)   . Gout   . History of colon polyps   . Hoarseness   . Hyperlipidemia   . Hypertension   . Neck abscess 09/03/2016  . Pulmonary emphysema ([place])   . Seizure ([place])   . Stroke ([place]) 2014  . Thyroid disease   . TIA (transient ischemic attack)   . Urinary frequency   . Urine retention   . UTI (urinary tract infection)     Prior to Admission medications   Medication  Sig Start Date End Date Taking? Authorizing Provider  allopurinol (ZYLOPRIM) 100 MG tablet Take 100 mg by mouth 2 (two) times daily. 08/02/18 08/02/19  [provider]  amLODipine (NORVASC) 5 MG tablet Take 5 mg by mouth every morning.    [provider]  atorvastatin (LIPITOR) 20 MG tablet Take 20 mg by mouth at bedtime.  04/10/17   [provider]  budesonide-formoterol (SYMBICORT) 80-4.5 MCG/ACT inhaler Inhale 2 puffs into the lungs 2 (two) times daily.    [provider]  chlorthalidone (HYGROTON) 50 MG tablet Take 50 mg by mouth daily.    [provider]  EPINEPHrine 0.3 mg/0.3 mL IJ SOAJ injection Inject into the muscle once.    [provider]  FLUoxetine (PROZAC) 20 MG capsule Take 20 mg by mouth daily. 08/02/18 08/02/19  [provider]  HYDROcodone-acetaminophen (NORCO) 5-325 MG tablet Take 1 tablet by mouth every 4 (four) hours as needed for moderate pain. 11/20/18   Ceasar Mons, MD  ibuprofen (ADVIL,MOTRIN) 200 MG tablet Take 400 mg by mouth daily as needed for moderate pain.    [provider]  levETIRAcetam (KEPPRA) 500 MG tablet Take 250 mg by mouth 2 (two) times daily.    [provider]  omeprazole (PRILOSEC) 20 MG capsule Take 20 mg by mouth daily.    [provider]  oxybutynin (DITROPAN) 5 MG tablet Take 1 tablet (5 mg total) by mouth every 8 (eight) hours as needed for bladder spasms. 11/20/18   Ceasar Mons, MD  tamsulosin (FLOMAX) 0.4 MG CAPS capsule Take 1 capsule (0.4 mg total) by mouth daily. 09/23/18   Jola Schmidt, MD    Allergies  Allergen Reactions  . Bee Venom Anaphylaxis    Face gets red and hard to breathe     Social History   Tobacco Use  . Smoking status: Former Research scientist (life sciences)  . Smokeless tobacco: Never Used  Substance Use Topics  . Alcohol use: No  . Drug use: No    Family History  Problem Relation Age of Onset  . Liver cancer Neg Hx   . Liver disease Neg Hx      Objective:   Vitals:   12/18/19 0951  BP: (!) 155/90  Pulse: 80  Temp: 98 F (36.7 C)  SpO2: 97%   Constitutional: in no apparent distress, well developed and well nourished and oriented times 3 Eyes: anicteric Cardiovascular: Cor RRR and No murmurs Respiratory: clear Gastrointestinal: Bowel sounds are  normal, liver edge palpable 2 cm below costal margin, spleen is not enlarged Musculoskeletal: peripheral pulses normal, no pedal edema, no clubbing or cyanosis Skin: negative for - jaundice, spider hemangioma, telangiectasia, palmar erythema, ecchymosis and atrophy; no porphyria cutanea tarda Lymphatic: no cervical lymphadenopathy   Laboratory: Genotype: No results found for: HCVGENOTYPE HCV viral load: No results found for: HCVQUANT Lab Results  Component Value Date   WBC 6.7 09/16/2018   HGB 12.2 (L) 11/20/2018   HCT 36.0 (L) 11/20/2018   MCV 98.7 09/16/2018   PLT 304 09/16/2018    Lab Results  Component Value Date   CREATININE 1.10 11/20/2018   BUN 16 11/20/2018   NA 142 11/20/2018   K 4.0 11/20/2018   CL 106 11/20/2018   CO2 27 09/16/2018    Lab Results  Component Value Date   ALT 18 01/16/2015   AST 18 01/16/2015   ALKPHOS 44 01/16/2015    Lab Results  Component Value Date   BILITOT 0.5 01/16/2015  ALBUMIN 3.6 01/16/2015    Imaging:  12/17/19 ABD Ultrasound  IMPRESSION: 1. 4 cm complex cystic and solid appearing exophytic upper pole right renal lesion. Recommend MRI abdomen without and with contrast for further evaluation. 2. Diffuse fatty infiltration of the liver. 3. Normal gallbladder and normal caliber common bile duct. 4. Poor visualization of the IVC and pancreas and parts of the aorta.   Assessment & Plan:   Problem List Items Addressed This Visit      High   Hepatitis C antibody positive in blood - Primary    New patient with + Hep C Ab in blood indicating previous exposure to virus. Risk factors date back > 35 years ago with drug use; no ongoing risk factors identified at present. He is treatment naive. Ultrasound indicates fatty infiltration but otherwise no synthetic signs of liver dysfunction noted on labs from PCP office.   Recommended his wife get screened as well given they have been married since 1998.   I discussed with the patient the lab  findings that confirm chronic hepatitis C as well as the natural history and progression of disease including about 30% of people who develop cirrhosis of the liver if left untreated and once cirrhosis is established there is a 2-7% risk per year of liver cancer and liver failure.  I discussed the importance of treatment and benefits in reducing the risk, even if significant liver fibrosis exists. I also discussed risk for re-infection following treatment should he not continue to modify risk factors.    Patient counseled extensively on limiting acetaminophen to no more than 2 grams daily, avoidance of alcohol.  Transmission discussed with patient including sexual transmission, sharing razors and toothbrush.   Will prescribe appropriate medication based on genotype and coverage - planning Epclusa as a small pill is desired.   Hepatitis A and B titers to be drawn today with appropriate vaccinations as needed   Pneumovax vaccine at upcoming visit if not previously given  Liver staging with FibroTest for further work up - regarding his liver at least there is no concerning findings for [facility] or cirrhosis. Weight gain largely the contributing factor to his fatty liver disease and stomach complaints. I see no findings concerning for ascites or liver decompensation on exam today.   Will call TYSEN ROESLER back once all results are in and counsel on medication over the phone. He will return 4 weeks after starting to meet with pharmacy team and check RNA at that time.        Relevant Orders   Hepatitis B surface antigen   Hepatitis B surface antibody,qualitative   Hepatitis B Core Antibody, total   Liver Fibrosis, FibroTest-ActiTest   Hepatitis C genotype   Hepatitis C RNA quantitative ([facility])     Unprioritized   Seizure disorder (HCC)    On keppra - no interactions with Epclusa      History of CVA (cerebrovascular accident)    On Lipitor 20 mg - safe with Epclusa without dose reduction.        History of alcohol dependence ([place])    Has been 30 years since he describes consistent alcohol use. CAGE - and does not seem to be a current problem.       Abdominal distension    No findings concerning for ascites / decompensated cirrhosis. Abdominal ultrasound does reveal a cystic/partilaly solid appearing mass on right kidney desiring further MRI characterization. I discussed with them today that they will need to follow up  with Dr. Lysle Rubens regarding next steps for this. I don't think either are contributing to his abdominal distension.  Unclear if he has had a colonoscopy - would consider for routine screening but does not sound to have had changes in BMs.  May be related to weight gain - he is up 30 lbs since November 2019.          I spent 45 minutes with the patient including greater than 70% of time in face to face counsel of the patient re hepatitis c and the details described above and in coordination of their care.   Janene Madeira, MSN, NP-C [facility] for Infectious Disease [email or web address] Pager: [phone] Office: [phone] [place]: [phone]

## 2019-12-18 NOTE — Assessment & Plan Note (Signed)
New patient with + Hep C Ab in blood indicating previous exposure to virus. Risk factors date back > 35 years ago with drug use; no ongoing risk factors identified at present. He is treatment naive. Ultrasound indicates fatty infiltration but otherwise no synthetic signs of liver dysfunction noted on labs from PCP office.   Recommended his wife get screened as well given they have been married since 1998.   I discussed with the patient the lab findings that confirm chronic hepatitis C as well as the natural history and progression of disease including about 30% of people who develop cirrhosis of the liver if left untreated and once cirrhosis is established there is a 2-7% risk per year of liver cancer and liver failure.  I discussed the importance of treatment and benefits in reducing the risk, even if significant liver fibrosis exists. I also discussed risk for re-infection following treatment should he not continue to modify risk factors.    Patient counseled extensively on limiting acetaminophen to no more than 2 grams daily, avoidance of alcohol.  Transmission discussed with patient including sexual transmission, sharing razors and toothbrush.   Will prescribe appropriate medication based on genotype and coverage - planning Epclusa as a small pill is desired.   Hepatitis A and B titers to be drawn today with appropriate vaccinations as needed   Pneumovax vaccine at upcoming visit if not previously given  Liver staging with FibroTest for further work up - regarding his liver at least there is no concerning findings for [facility] or cirrhosis. Weight gain largely the contributing factor to his fatty liver disease and stomach complaints. I see no findings concerning for ascites or liver decompensation on exam today.   Will call JAUAN WOHL back once all results are in and counsel on medication over the phone. He will return 4 weeks after starting to meet with pharmacy team and check RNA at that time.

## 2019-12-18 NOTE — Assessment & Plan Note (Signed)
Has been 30 years since he describes consistent alcohol use. CAGE - and does not seem to be a current problem.

## 2019-12-18 NOTE — Patient Instructions (Signed)
Hepatitis C Antibody test for [place].   Nice to meet you today!    We need to get a little more information about your hepatitis c infection before we start your treatment. I anticipate that we can get you started in a few weeks after we submit approval to your insurance to ensure payment. We may need to place referral for an ultrasound and/or gastroenterology if your blood work indicates more damage to the liver than expected.     ABOUT HEPATITIS C VIRUS:   Chronic Hepatitis C is the most common blood-borne infection in the [place], affecting approximately 3 million people.   It is the leading cause of cirrhosis, liver cancer, and end stage liver disease requiring transplantation when this infection goes untreated for many years   The majority of people who are infected are unaware because there are not many early symptoms that are specific to this and often go undiagnosed until a specific blood test is drawn.    The hepatitis c virus is passed primarily through direct exposure of contaminated blood or body fluids. It is most efficiently transmitted through repeated exposure to infected blood.   Risk for sexual transmission is very low but is possible if there is high frequency of unprotected sexual activity with known hepatitis c partner or multiple partners of known status.   Over time, approximately 60-70% of people can develop some degree of liver disease. Cirrhosis occurs in 10-20% of those with chronic infection. 1-5% will get liver cancer, which has a very high rate of death.    Approximately 15-25% clear the infection without medication (usually in the first 6 months of becoming exposed to virus)   Newer medications provide over 95% cure rate when taken as prescribed    IN GENERAL ABOUT DIET  . Persons living with chronic hepatitis c infection should eat a diet to maintain a healthy weight and avoid nutritional deficiencies.   . Completely avoiding alcohol is the  best decision for your liver health. If unable to do so please limit alcohol to as little as possible to less than 1 standard drink a day - this is very irritating to your liver.  . Limit tylenol use to less than 2,000 mg daily (two extra strength tablets only twice a day)  . If you have cirrhosis of the liver please take no more than 1,000 mg tylenol a day  . Patients with cirrhosis should not have protein restriction; we recommend a protein intake of approximately 1.2-1.5 g/kg/day.   . For patients with cirrhosis and hepatic encephalopathy, the American Association for the Study of Liver Diseases (AASLD) recommended protein intake is 1.2-1.5 g/kg/day.  . If you experience ascites (fluid accumulation in the abdomen associated with severe liver damage / cirrhosis) please limit sodium intake to < 2000 mg a day    UNTIL YOU HAVE BEEN TREATED AND CURED:  . Use condoms with all sexual encounters or practice abstinence to avoid sexual transmission   . No sharing of razors, toothbrushes, nail clippers or anything that could potentially have blood on it.   . If you cut yourself please clean and cover any wounds or open sores to others do not come into contact with your blood.   . If blood spills onto item/surface please clean with 1:10 bleach solution and allow to dry, EVEN if it is dried blood.    GENERAL HELPFUL HINTS ON HCV THERAPY:  1. Stay well-hydrated.  2. Notify the [facility] Clinic of any changes  in your other over-the-counter/herbal or prescription medications.  3. If you miss a dose of your medication, take the missed dose as soon as you remember. Return to your regular time/dose schedule the next day.   4.  Do not stop taking your medications without first talking with your healthcare provider.  5.  You will see our pharmacist-specialist within the first 2 weeks of starting your medication to monitor for any possible side effects.  6.  You will have blood work once during treatment  4 weeks after your first pill. Again soon after treatment is completed and one final lab 3 months after your last pill to ensure cure!   TIPS TO BE SUCCESSFUL WITH DAILY MEDICATION USE:  1. Set a reminder on your phone  2. Try filling out a pill box for the week - pick a day and put one pill for every day during the week so you know right away if you missed a pill.   3. Have a trusted family member ask you about your medications.   4. Smartphone app

## 2019-12-19 ENCOUNTER — Telehealth: Payer: Self-pay

## 2019-12-19 NOTE — Telephone Encounter (Signed)
-----   Message from [place] Call[place], NP sent at 12/19/2019  1:24 PM EST ----- Please call Mr. Emanuelson to let him know we need him to come back at his convenience next week for one additional blood test that I forgot to entered. My fault completely. We need an updated Hepatitis C RNA quantitative level to have his medication approved.

## 2019-12-19 NOTE — Telephone Encounter (Signed)
Per [place] called patient to schedule appointment for labs. Spoke with patient's wife who was able to schedule appointment. [place]

## 2019-12-22 ENCOUNTER — Other Ambulatory Visit: Payer: Self-pay | Admitting: Infectious Diseases

## 2019-12-22 DIAGNOSIS — R768 Other specified abnormal immunological findings in serum: Secondary | ICD-10-CM

## 2019-12-23 ENCOUNTER — Other Ambulatory Visit (HOSPITAL_COMMUNITY): Payer: Self-pay | Admitting: Internal Medicine

## 2019-12-23 ENCOUNTER — Other Ambulatory Visit: Payer: Self-pay | Admitting: Internal Medicine

## 2019-12-23 DIAGNOSIS — R9341 Abnormal radiologic findings on diagnostic imaging of renal pelvis, ureter, or bladder: Secondary | ICD-10-CM | POA: Diagnosis not present

## 2019-12-23 DIAGNOSIS — R8279 Other abnormal findings on microbiological examination of urine: Secondary | ICD-10-CM | POA: Diagnosis not present

## 2019-12-23 DIAGNOSIS — N289 Disorder of kidney and ureter, unspecified: Secondary | ICD-10-CM

## 2019-12-24 ENCOUNTER — Other Ambulatory Visit: Payer: Medicare HMO

## 2019-12-24 ENCOUNTER — Other Ambulatory Visit: Payer: Self-pay

## 2019-12-24 DIAGNOSIS — R768 Other specified abnormal immunological findings in serum: Secondary | ICD-10-CM | POA: Diagnosis not present

## 2019-12-25 LAB — LIVER FIBROSIS, FIBROTEST-ACTITEST
ALT: 10 U/L (ref 9–46)
Alpha-2-Macroglobulin: 116 mg/dL (ref 106–279)
Apolipoprotein A1: 105 mg/dL (ref 94–176)
Bilirubin: 0.3 mg/dL (ref 0.2–1.2)
Fibrosis Score: 0.1
GGT: 25 U/L (ref 3–70)
Haptoglobin: 261 mg/dL — ABNORMAL HIGH (ref 43–212)
Necroinflammat ACT Score: 0.02
Reference ID: 3271799

## 2019-12-25 LAB — HEPATITIS C GENOTYPE: HCV Genotype: NOT DETECTED

## 2019-12-25 LAB — HEPATITIS B SURFACE ANTIGEN: Hepatitis B Surface Ag: NONREACTIVE

## 2019-12-25 LAB — HEPATITIS B SURFACE ANTIBODY,QUALITATIVE: Hep B S Ab: REACTIVE — AB

## 2019-12-25 LAB — HEPATITIS B CORE ANTIBODY, TOTAL: Hep B Core Total Ab: REACTIVE — AB

## 2019-12-26 ENCOUNTER — Ambulatory Visit (HOSPITAL_COMMUNITY): Payer: Medicare HMO

## 2019-12-27 LAB — HEPATITIS C RNA QUANTITATIVE
HCV Quantitative Log: <1.18 Log IU/mL — AB
HCV RNA, PCR, QN: <15 IU/mL — AB

## 2019-12-29 ENCOUNTER — Other Ambulatory Visit: Payer: Self-pay | Admitting: Internal Medicine

## 2019-12-29 DIAGNOSIS — B182 Chronic viral hepatitis C: Secondary | ICD-10-CM | POA: Diagnosis not present

## 2019-12-29 DIAGNOSIS — N289 Disorder of kidney and ureter, unspecified: Secondary | ICD-10-CM | POA: Diagnosis not present

## 2019-12-30 NOTE — Progress Notes (Signed)
Patient Hep C RNA < 15 copies indicating he has no chronic / active hepatitis C infection. No findings concerning for cirrhosis or fibrosis on U/S or with FibroTest (F0).   I called his wife to discuss and also informed of history of cleared Hep B. No further medications or monitoring needed. FU with Dr. Lysle Rubens as scheduled.

## 2020-01-07 ENCOUNTER — Ambulatory Visit
Admission: RE | Admit: 2020-01-07 | Discharge: 2020-01-07 | Disposition: A | Discharge status: Home / Self Care | Payer: Medicare HMO | Source: Ambulatory Visit | Attending: Internal Medicine | Admitting: Internal Medicine

## 2020-01-07 ENCOUNTER — Other Ambulatory Visit: Payer: Self-pay

## 2020-01-07 DIAGNOSIS — N289 Disorder of kidney and ureter, unspecified: Secondary | ICD-10-CM

## 2020-01-07 IMAGING — CT CT ABD-PELV W/O CM
1 of 2 series · 14 of 32 positions shown, 19 images · non-contrast
Comparison: [DATE]

CLINICAL DATA: Follow-up right renal lesion

EXAM:
CT ABDOMEN AND PELVIS WITHOUT CONTRAST
TECHNIQUE: Multidetector CT imaging of the abdomen and pelvis was performed
following the standard protocol without IV contrast.

[Series 2: abd/pelvis w/(date) · axial · 0.82mm/px · z∈[-458,-78]mm · 14 of 86 slices shown, 19 images]
[im 5/86  soft-tissue]
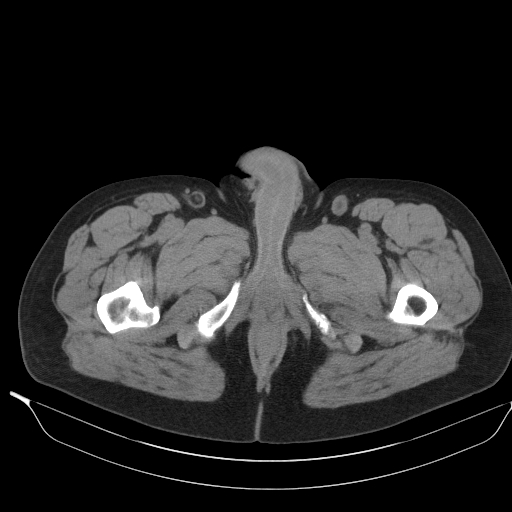
[im 5/86  bone]
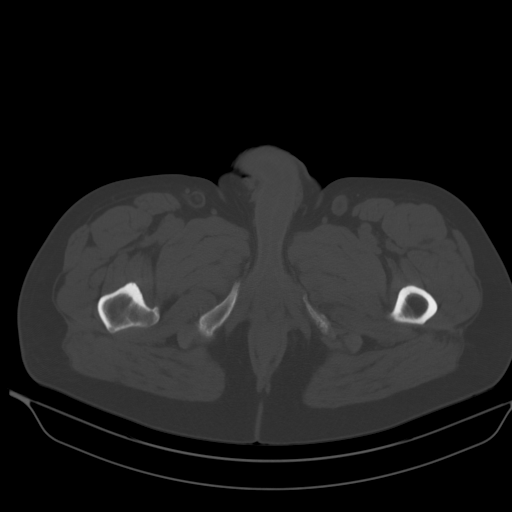
[im 14/86  soft-tissue]
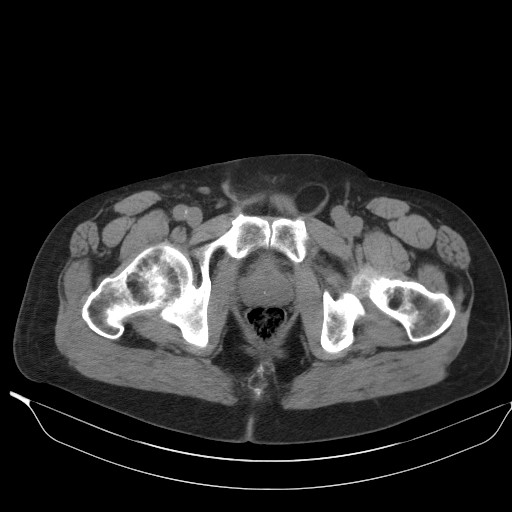
[im 18/86  soft-tissue]
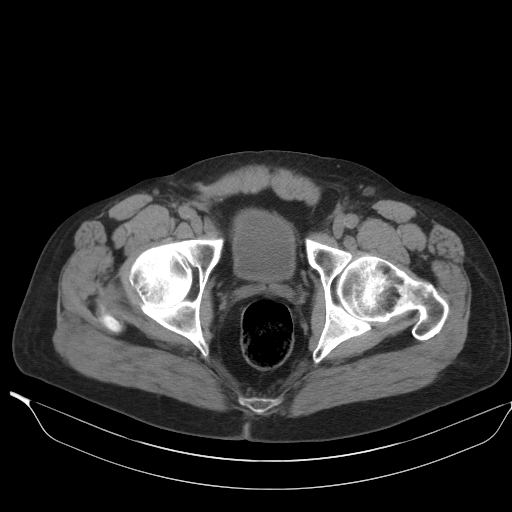
[im 23/86  soft-tissue]
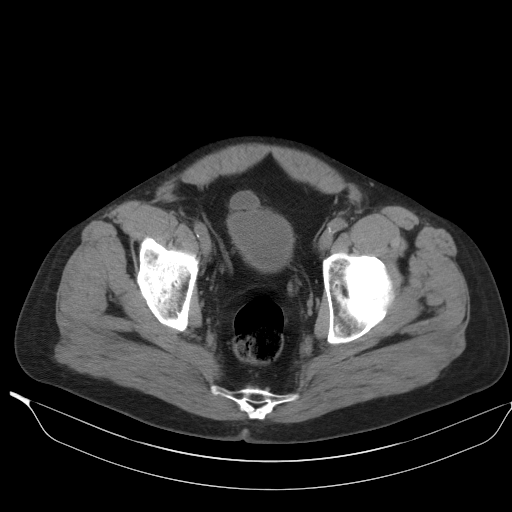
[im 32/86  soft-tissue]
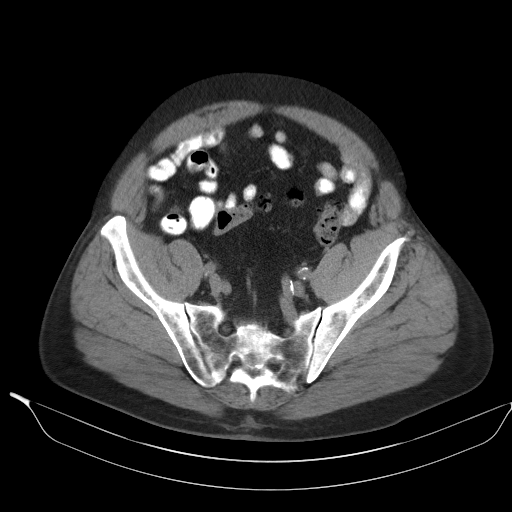
[im 36/86  soft-tissue]
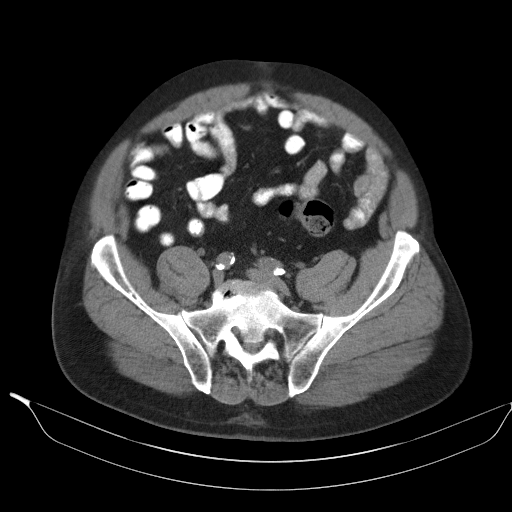
[im 45/86  soft-tissue]
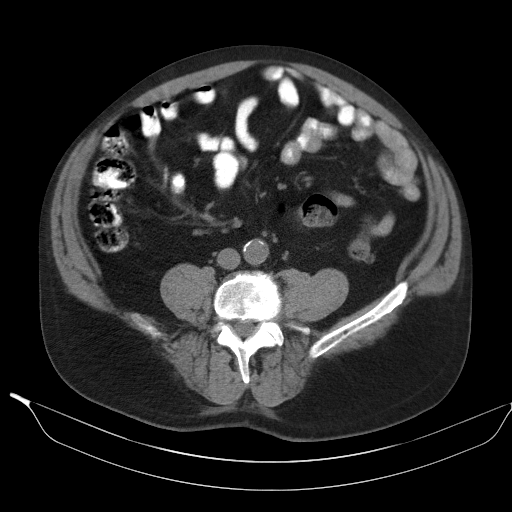
[im 50/86  soft-tissue]
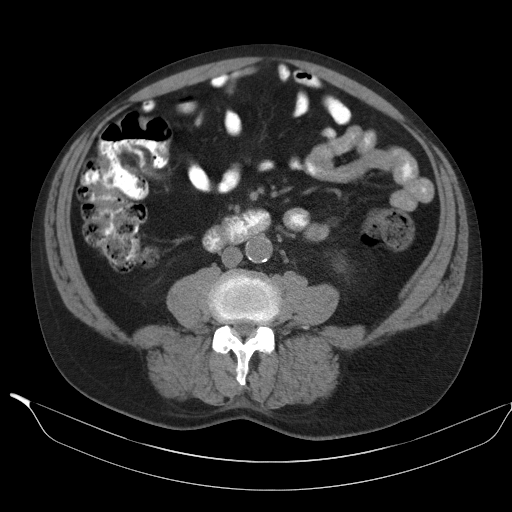
[im 54/86  soft-tissue]
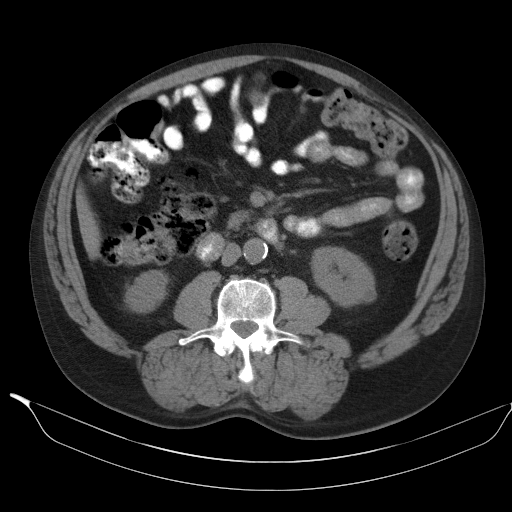
[im 54/86  bone]
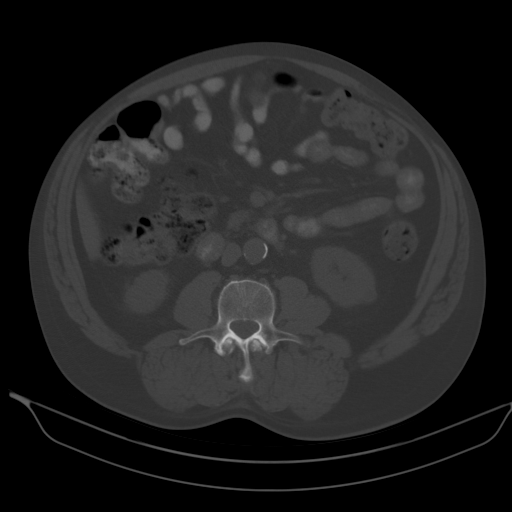
[im 63/86  soft-tissue]
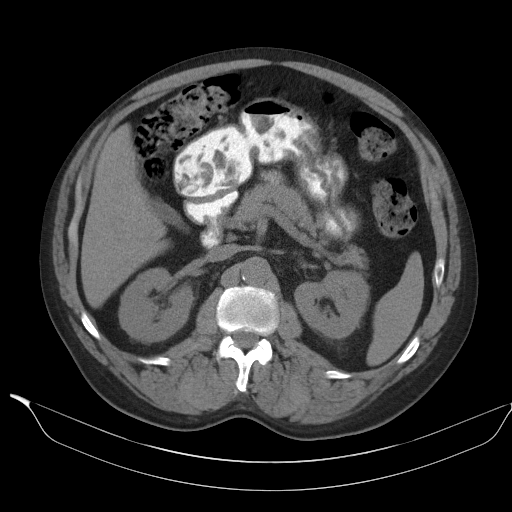
[im 68/86  soft-tissue]
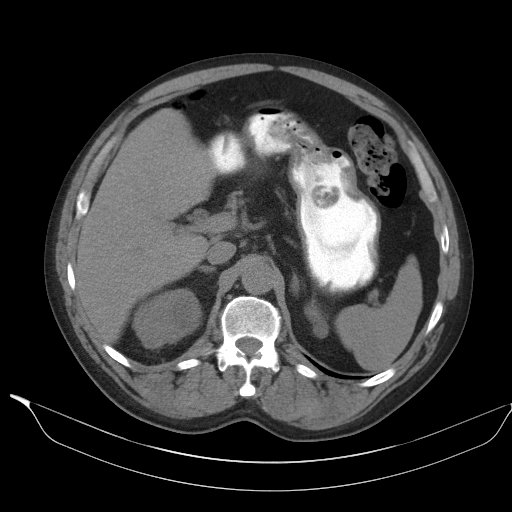
[im 68/86  lung]
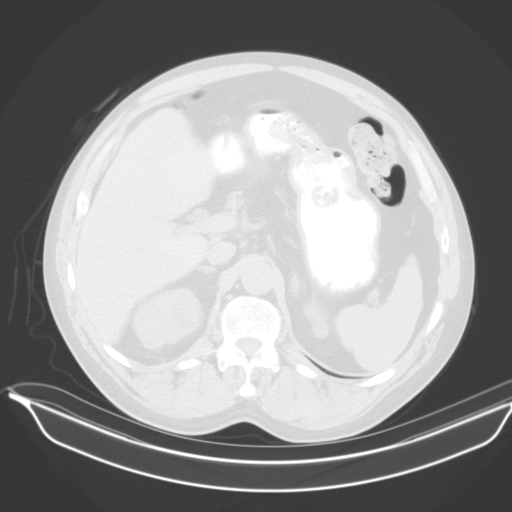
[im 72/86  soft-tissue]
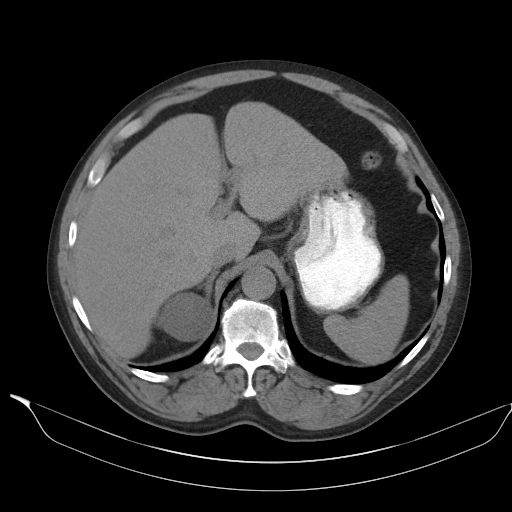
[im 72/86  lung]
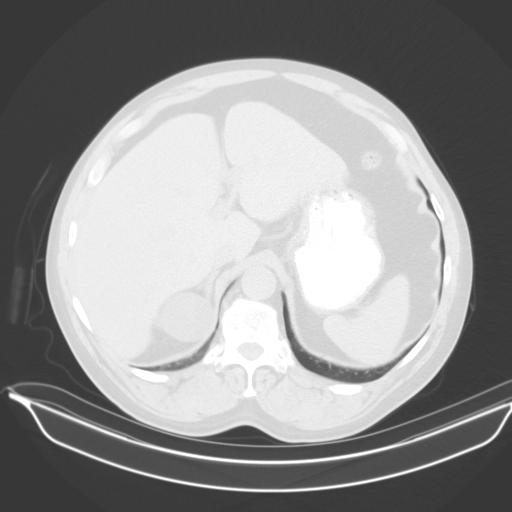
[im 77/86  lung]
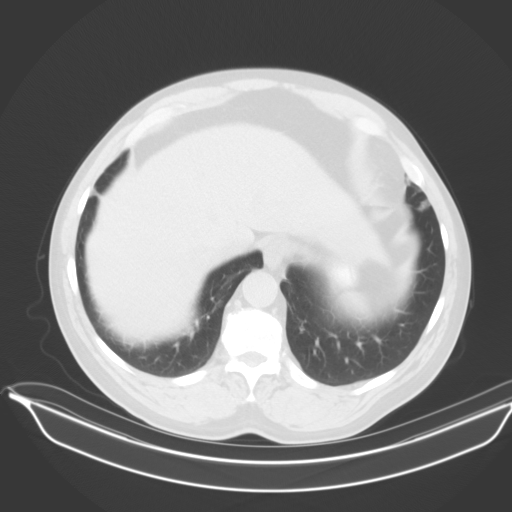
[im 81/86  soft-tissue]
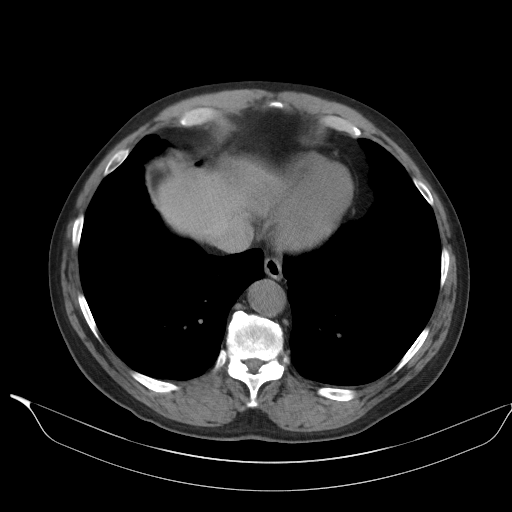
[im 81/86  lung]
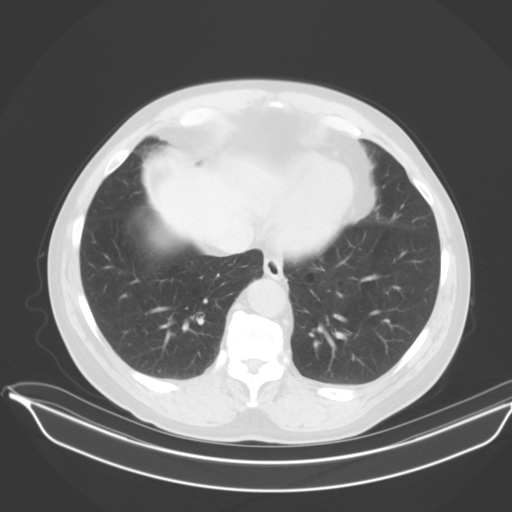

[14 of 32 positions shown; findings below may reference images not displayed]

FINDINGS: Lower chest: Lung bases are free of acute infiltrate or sizable
effusion.

Hepatobiliary: No focal liver abnormality is seen. No gallstones,
gallbladder wall thickening, or biliary dilatation.

Pancreas: Unremarkable. No pancreatic ductal dilatation or
surrounding inflammatory changes.

Spleen: Within normal limits.

Adrenals/Urinary Tract: Adrenal glands are within normal limits.
Left kidney demonstrates small exophytic lesions likely representing
cysts. These were not well appreciated on the prior ultrasound
examination. The right kidney shows a dominant 4.2 x 4.0 cm
hypodense lesion which corresponds to the complex cyst seen in the
upper pole on the recent exam. Smaller cysts are noted which also
correspond to the previous ultrasound. No renal calculi or
obstructive changes are seen. The bladder is partially distended.
Small bladder diverticulum is noted.

Stomach/Bowel: Scattered diverticular change of the colon is noted.
No findings to suggest diverticulitis are seen. The appendix is
within normal limits. No small bowel or gastric abnormality is
noted.

Vascular/Lymphatic: Aortic atherosclerosis. No enlarged abdominal or
pelvic lymph nodes.

Reproductive: Prostate is unremarkable.

Other: No abdominal wall hernia or abnormality. No abdominopelvic
ascites.

Musculoskeletal: No acute or significant osseous findings.
IMPRESSION: Cystic lesions within the kidneys bilaterally. The dominant lesion
in the upper pole of the right kidney is similar to that seen on the
prior ultrasound. Continued follow-up is recommended to assess for
stability. Given the loop recorder and inability to perform MRI
examination, a pre and post-contrast CT with delayed images is
recommended for further evaluation.

Diverticulosis without diverticulitis.

No other focal abnormality is noted.

## 2020-01-16 DIAGNOSIS — N401 Enlarged prostate with lower urinary tract symptoms: Secondary | ICD-10-CM | POA: Diagnosis not present

## 2020-01-16 DIAGNOSIS — R3914 Feeling of incomplete bladder emptying: Secondary | ICD-10-CM | POA: Diagnosis not present

## 2020-01-16 DIAGNOSIS — N281 Cyst of kidney, acquired: Secondary | ICD-10-CM | POA: Diagnosis not present

## 2020-01-23 DIAGNOSIS — N281 Cyst of kidney, acquired: Secondary | ICD-10-CM | POA: Diagnosis not present

## 2020-04-12 LAB — FERRITIN: Ferritin: 152.5 ng/mL (ref 30.0–400.0)

## 2020-04-12 LAB — CBC
Hematocrit: 41.7 % (ref 38.0–52.0)
Hemoglobin: 14.2 g/dL (ref 13.0–17.3)
MCH: 32.1 pg (ref 27.0–34.5)
MCHC: 34.1 g/dL (ref 32.0–36.0)
MCV: 94.1 fL (ref 84.0–100.0)
MPV: 10.7 fL (ref 7.2–13.2)
NRBC Absolute: 0 10*3/uL (ref 0.000–0.012)
NRBC Automated: 0 % (ref 0.0–0.2)
Platelets: 196 10*3/uL (ref 140–440)
RBC: 4.43 x10e6/mcL (ref 4.00–5.60)
RDW: 13.2 % (ref 11.0–16.0)
WBC: 4.9 10*3/uL (ref 3.8–10.6)

## 2020-04-12 LAB — T4, FREE: T4 Free: 1.4 ng/dL (ref 0.82–1.70)

## 2020-04-12 LAB — TSH: TSH, 3RD GENERATION: 8.77 mcIU/mL — ABNORMAL HIGH (ref 0.358–3.740)

## 2020-04-12 LAB — VITAMIN B12: Vitamin B-12: 670 pg/mL (ref 232–1245)

## 2020-06-29 LAB — TSH: TSH, 3RD GENERATION: 0.427 mcIU/mL (ref 0.358–3.740)

## 2020-06-29 LAB — T4, FREE: T4 Free: 1.76 ng/dL — ABNORMAL HIGH (ref 0.82–1.70)

## 2020-07-12 LAB — CBC WITH AUTO DIFFERENTIAL
Absolute Baso #: 0 10*3/uL (ref 0.0–0.2)
Absolute Eos #: 0.1 10*3/uL (ref 0.0–0.5)
Absolute Lymph #: 0.9 10*3/uL — ABNORMAL LOW (ref 1.0–3.2)
Absolute Mono #: 0.5 10*3/uL (ref 0.3–1.0)
Basophils %: 0.3 % (ref 0.0–2.0)
Eosinophils %: 2 % (ref 0.0–7.0)
Hematocrit: 42.4 % (ref 38.0–52.0)
Hemoglobin: 14.3 g/dL (ref 13.0–17.3)
Immature Grans (Abs): 0.01 10*3/uL (ref 0.00–0.06)
Immature Granulocytes: 0.2 % (ref 0.1–0.6)
Lymphocytes: 15.1 % (ref 15.0–45.0)
MCH: 33.1 pg (ref 27.0–34.5)
MCHC: 33.7 g/dL (ref 32.0–36.0)
MCV: 98.1 fL (ref 84.0–100.0)
MPV: 9.8 fL (ref 7.2–13.2)
Monocytes: 7.6 % (ref 4.0–12.0)
Neutrophils %: 74.8 % — ABNORMAL HIGH (ref 42.0–74.0)
Neutrophils Absolute: 4.4 10*3/uL (ref 1.6–7.3)
Platelets: 185 10*3/uL (ref 140–440)
RBC: 4.32 x10e6/mcL (ref 4.00–5.60)
RDW: 11.5 % (ref 11.0–16.0)
WBC: 5.9 10*3/uL (ref 3.8–10.6)

## 2020-07-12 LAB — MAGNESIUM: Magnesium: 2.2 mg/dL (ref 1.6–2.6)

## 2020-07-12 LAB — POC URINALYSIS, CHEMISTRY
Bilirubin, Urine, POC: NEGATIVE
Blood, UA POC: NEGATIVE
Glucose, UA POC: NEGATIVE mg/dL
Leukocytes, UA: NEGATIVE
Nitrate, UA POC: NEGATIVE
Protein, Urine, POC: NEGATIVE
Specific Gravity, Urine, POC: >=1.03 — AB (ref 1.003–1.035)
UROBILIN U POC: 0.2 EU/dL
pH, Urine, POC: 6.5 (ref 4.5–8.0)

## 2020-07-12 LAB — BASIC METABOLIC PANEL
Anion Gap: 16 mmol/L (ref 2–17)
BUN: 26 mg/dL — ABNORMAL HIGH (ref 8–23)
CO2: 22 mmol/L (ref 22–29)
Calcium: 9.4 mg/dL (ref 8.8–10.2)
Chloride: 101 mmol/L (ref 98–107)
Creatinine: 0.9 mg/dL (ref 0.7–1.3)
GFR African American: 101 mL/min/{1.73_m2} (ref >=90)
GFR Non-African American: 87 mL/min/{1.73_m2} — ABNORMAL LOW (ref >=90)
Glucose: 128 mg/dL — ABNORMAL HIGH (ref 70–99)
OSMOLALITY CALCULATED: 284 mOsm/kg (ref 270–287)
Potassium: 3.8 mmol/L (ref 3.5–5.3)
Sodium: 139 mmol/L (ref 135–145)

## 2020-07-12 LAB — TROPONIN T: Troponin T: <0.01 ng/mL (ref 0.000–0.010)

## 2020-07-12 LAB — PROTIME-INR
INR: 1.1 — ABNORMAL LOW (ref 1.5–3.5)
Protime: 14.3 seconds (ref 11.6–14.5)

## 2020-07-12 LAB — D-DIMER, QUANTITATIVE: D-Dimer, Quant: 0.56 mcg/mL FEU — ABNORMAL HIGH (ref 0.19–0.51)

## 2020-07-12 NOTE — ED Notes (Signed)
ED Patient Education Note     Patient Education Materials Follows:  Cardiovascular     Syncope    Syncope is when you lose temporarily pass out (faint). Signs that you may be about to pass out include:       Feeling dizzy or light-headed.     Feeling sick to your stomach (nauseous).     Seeing all white or all black.     Having cold, clammy skin.    If you passed out, get help right away. Call your local emergency services (911 in the [place] Do not drive yourself to the hospital.    HOME CARE    Pay attention to any changes in your symptoms. Take these actions to help with your condition:     Have someone stay with you until you feel stable.     Do not drive, use machinery, or play sports until your doctor says it is okay.     Keep all follow-up visits as told by your doctor. This is important.     If you start to feel like you might pass out, lie down right away and raise (elevate) your feet above the level of your heart. Breathe deeply and steadily. Wait until all of the symptoms are gone.     Drink enough fluid to keep your pee (urine) clear or pale yellow.     If you are taking blood pressure or heart medicine, get up slowly and spend many minutes getting ready to sit and then stand. This can help with dizziness.     Take over-the-counter and prescription medicines only as told by your doctor.    GET HELP RIGHT AWAY IF:     You have a very bad headache.     You have unusual pain in your chest, tummy, or back.     You are bleeding from your mouth or rectum.     You have black or tarry poop (stool).     You have a very fast or uneven heartbeat (palpitations).     It hurts to breathe.     You pass out once or more than once.     You have jerky movements that you cannot control (seizure).     You are confused.     You have trouble walking.     You are very weak.     You have vision problems.    These symptoms may be an emergency. Do not wait to see if the symptoms will go away. Get medical help right away. Call your  local emergency services (911 in the [place] Do not drive yourself to the hospital.    This information is not intended to replace advice given to you by your health care provider. Make sure you discuss any questions you have with your health care provider.    Document Released: 04/10/2008 Document Revised: 10/04/2015 Document Reviewed: 07/07/2015  Elsevier Interactive Patient Education ?2016 [employer]

## 2020-07-12 NOTE — ED Notes (Signed)
 ED Triage Note       ED Triage Adult Entered On:  07/12/2020 14:21 EDT    Performed On:  07/12/2020 14:17 EDT by Myrick, RN, [name] A               Triage   Numeric Rating Pain Scale :   0 = No pain   Chief Complaint :   Pt. reports he a syncopal episode of Friday while doing yardwork and had not been seen. Pt. was working in the yard today and had another syncopal episode today witnessed by his wife. Pt. sat down and did not strike head. Pt. had chest tightness.   [place] Mode of Arrival :   Ambulance   Infectious Disease Documentation :   Document assessment   Patient received chemo or biotherapy last 48 hrs? :   No   Temperature Oral :   37.0 degC(Converted to: 98.6 degF)    Heart Rate Monitored :   87 bpm   Respiratory Rate :   16 br/min   Systolic Blood Pressure :   137 mmHg   Diastolic Blood Pressure :   78 mmHg   SpO2 :   97 %   Oxygen Therapy :   Room air   Patient presentation :   None of the above   Chief Complaint or Presentation suggest infection :   No   Dosing Weight Obtained By :   Patient stated   Weight Dosing :   92.98 kg(Converted to: 205 lb 0 oz)    Height :   184 cm(Converted to: 6 ft 0 in)    Body Mass Index Dosing :   27 kg/m2   Myrick OBIE [name] DELENA - 07/12/2020 14:17 EDT   DCP GENERIC CODE   Tracking Acuity :   3   Tracking Group :   ED [facility]   Myrick, RNRudolph DELENA - 07/12/2020 14:17 EDT   ED General Section :   Document assessment   Pregnancy Status :   N/A   ED Allergies Section :   Document assessment   ED Reason for Visit Section :   Document assessment   ED Quick Assessment :   Patient appears awake, alert, oriented to baseline. Skin warm and dry. Moves all extremities. Respiration even and unlabored. Appears in no apparent distress.   Myrick RN, [name] DELENA - 07/12/2020 14:17 EDT   PTA/Triage Treatments   ED PTA Pre-Arrival Service :   [facility]   [place], [place], [name] A - 07/12/2020 14:17 EDT   ID Risk Screen Symptoms   Recent Travel History :   No recent travel   Close  Contact with COVID-19 ID :   No   Last 14 days COVID-19 ID :   No   TB Symptom Screen :   No symptoms   C. diff Symptom/History ID :   Neither of the above   Myrick, RNRudolph A - 07/12/2020 14:17 EDT   Allergies   (As Of: 07/12/2020 14:21:46 EDT)   Allergies (Active)   No Known Allergies  Estimated Onset Date:   Unspecified ; Created By:   Teresa Spates D; Reaction Status:   Active ; Category:   Drug ; Substance:   No Known Allergies ; Type:   Allergy ; Updated By:   Teresa Spates D; Source:   Paper Chart/Abstracting ; Reviewed Date:   07/12/2020 14:19 EDT  Psycho-Social   Last 3 mo, thoughts killing self/others :   Patient denies   Right click within box for Suspected Abuse policy link. :   None   Feels Safe Where Live :   Yes   Myrick RN, [name] - 07/12/2020 14:17 EDT   ED Reason for Visit   (As Of: 07/12/2020 14:21:46 EDT)   Problems(Active)    Aortic valve stenosis (SNOMED CT  :899366986 )  Name of Problem:   Aortic valve stenosis ; Recorder:   TURNER, RN, LYNN R; Confirmation:   Confirmed ; Classification:   Patient Stated ; Code:   [id number] ; Contributor System:   PowerChart ; Last Updated:   03/15/2017 9:47 EDT ; Life Cycle Date:   02/08/2017 ; Life Cycle Status:   Active ; Vocabulary:   SNOMED CT   ; Comments:        03/15/2017 9:47 - Velma, RN, Dickey FALCON  pt states was told moderatestenosis per dr loni. Does not cause any symptoms at this time    03/15/2017 9:59 - Velma, RN, Dickey FALCON  pt said he had no problems with anesthesia that lasted about the same length of time as the surgery in Apr'18 and Dr loni had already cleared him for the surgery in Apr'18-notes in [employer].      BPH (benign prostatic hyperplasia) (SNOMED CT  :603268987 )  Name of Problem:   BPH (benign prostatic hyperplasia) ; Recorder:   TURNER, RN, LYNN R; Confirmation:   Confirmed ; Classification:   Patient Stated ; Code:   [id number] ; Contributor System:   PowerChart ; Last Updated:   02/08/2017 14:41 EDT ; Life Cycle Date:   02/08/2017 ;  Life Cycle Status:   Active ; Vocabulary:   SNOMED CT        Dental crowns status (SNOMED CT  :[id number] )  Name of Problem:   Dental crowns status ; Recorder:   TURNER, RN, LYNN R; Confirmation:   Confirmed ; Classification:   Patient Stated ; Code:   [id number] ; Contributor System:   PowerChart ; Last Updated:   02/08/2017 14:42 EDT ; Life Cycle Date:   02/08/2017 ; Life Cycle Status:   Active ; Vocabulary:   SNOMED CT        H/O: HTN (hypertension) (SNOMED CT  :748325985 )  Name of Problem:   H/O: HTN (hypertension) ; Recorder:   TURNER, RN, LYNN R; Confirmation:   Confirmed ; Classification:   Patient Stated ; Code:   [id number] ; Contributor System:   PowerChart ; Last Updated:   02/08/2017 14:40 EDT ; Life Cycle Date:   02/08/2017 ; Life Cycle Status:   Active ; Vocabulary:   SNOMED CT        High cholesterol (SNOMED CT  :76716984 )  Name of Problem:   High cholesterol ; Recorder:   TURNER, RN, LYNN R; Confirmation:   Confirmed ; Classification:   Patient Stated ; Code:   [id number] ; Contributor System:   PowerChart ; Last Updated:   02/08/2017 14:39 EDT ; Life Cycle Date:   02/08/2017 ; Life Cycle Status:   Active ; Vocabulary:   SNOMED CT        Hypothyroid (SNOMED CT  :31731988 )  Name of Problem:   Hypothyroid ; Recorder:   TURNER, RN, LYNN R; Confirmation:   Confirmed ; Classification:   Patient Stated ; Code:   31731988 ; Contributor System:   PowerChart ; Last Updated:  02/08/2017 14:39 EDT ; Life Cycle Date:   02/08/2017 ; Life Cycle Status:   Active ; Vocabulary:   SNOMED CT        Tongue cancer (SNOMED CT  :517452989 )  Name of Problem:   Tongue cancer ; Onset Date:   02/04/2017 ; Recorder:   Velma RN, Dickey FALCON; Confirmation:   Confirmed ; Classification:   Patient Stated ; Code:   [id number] ; Contributor System:   PowerChart ; Last Updated:   03/15/2017 9:46 EDT ; Life Cycle Date:   03/15/2017 ; Life Cycle Status:   Active ; Vocabulary:   SNOMED CT   ; Comments:         03/15/2017 9:46 - Velma, RN, Dickey FALCON  had first surgery in Apr'18-now for Left Cervical Lymphadenectomy on 03/20/17        Diagnoses(Active)    Syncope/Near syncope  Date:   07/12/2020 ; Diagnosis Type:   Reason For Visit ; Confirmation:   Complaint of ; Clinical Dx:   Syncope/Near syncope ; Classification:   Medical ; Clinical Service:   Emergency medicine ; Code:   PNED ; Probability:   0 ; Diagnosis Code:   [id number]

## 2020-07-12 NOTE — Discharge Summary (Signed)
 ED Clinical Summary                     [facility]  [address] [place], [place], [address]  [phone]          PERSON INFORMATION  Name: Ruben [name], Ruben [name] Age:  69 Years DOB: 01/14/1951   Sex: Male Language: English PCP: ALAINE GLATTER L-DO   Marital Status: Married Phone: [phone] Med Service: MED-Medicine   MRN: [id number] Acct# [id number] Arrival: 07/12/2020 14:10:00   Visit Reason: Syncope/Near syncope; SNYCOPE Acuity: 3 LOS: 000 03:12   Address:    [address] [place] [address]   Diagnosis:    Syncope and collapse  Medications:          Medications that have not changed  Other Medications  aspirin 81 Milligram Oral (given by mouth) every day as needed fever.  Last Dose:____________________  ibuprofen 600 Milligram Oral (given by mouth) 4 times a day as needed mild pain (1-3).  Last Dose:____________________  levothyroxine 200 Microgram Oral (given by mouth) once a day (in the morning).  Last Dose:____________________  silodosin (Rapaflo 8 mg oral capsule) 1 Capsules Oral (given by mouth) once a day (in the evening).  Last Dose:____________________  venlafaxine (venlafaxine 75 mg oral capsule, extended release) 1 Capsules Oral (given by mouth) once a day (in the evening)., DO NOT CRUSH  Last Dose:____________________      Medications Administered During Visit:                Medication Dose Route   Sodium Chloride 0.9% 1000 mL IV Piggyback   tetanus/diphth/pertuss (Tdap) adult/adol 0.5 mL IM               Allergies      No Known Allergies      Major Tests and Procedures:  The following procedures and tests were performed during your ED visit.  COMMON PROCEDURES%>  COMMON PROCEDURES COMMENTS%>                PROVIDER INFORMATION               Provider Role Assigned Sampson CARBON, [place] B-MD ED Provider 07/12/2020 14:12:16    Myrick, RN, [name] ED Nurse 07/12/2020 14:16:58        Attending Physician:  CARBON COUGH B-MD      Admit Doc  BLUE,  MATTHEW B-MD      Consulting Doc       VITALS INFORMATION  Vital Sign Triage Latest   Temp Oral ORAL_1%> ORAL%>   Temp Temporal TEMPORAL_1%> TEMPORAL%>   Temp Intravascular INTRAVASCULAR_1%> INTRAVASCULAR%>   Temp Axillary AXILLARY_1%> AXILLARY%>   Temp Rectal RECTAL_1%> RECTAL%>   02 Sat 97 % 98 %   Respiratory Rate RATE_1%> RATE%>   Peripheral Pulse Rate PULSE RATE_1%>93 bpm PULSE RATE%>   Apical Heart Rate HEART RATE_1%> HEART RATE%>   Blood Pressure BLOOD PRESSURE_1%>/ BLOOD PRESSURE_1%>78 mmHg BLOOD PRESSURE%> / BLOOD PRESSURE%>82 mmHg                 Immunizations      tetanus/diphth/pertuss (Tdap) adult/adol (07/12/2020)          DISCHARGE INFORMATION   Discharge Disposition: H Outpt-Sent Home   Discharge Location:  Home   Discharge Date and Time:  07/12/2020 17:22:00   ED Checkout Date and Time:  07/12/2020 17:22:00     DEPART REASON INCOMPLETE INFORMATION  Depart Action Incomplete Reason   Interactive View/I&O Recently assessed               Problems      No Problems Documented              Smoking Status      Never smoker         PATIENT EDUCATION INFORMATION  Instructions:     Syncope, Easy-to-Read     Follow up:                   With: Address: When:   See your cardiologist tomorrow for close follow-up. Return here if acutely worse or any other concerns or problems.  Within 1 to 2 days   Comments:   Return to ED if symptoms worsen   Return to ED if symptoms worsen              ED PROVIDER DOCUMENTATION     Patient:   Ruben [name], Ruben [name]            MRN: [id number]            FIN: [id number]               Age:   69 years     Sex:  Male     DOB:  January 18, 1951   Associated Diagnoses:   Syncope and collapse   Author:   PARIS,  MATTHEW B-MD      History of Present Illness   The patient presents with syncope, near syncope and 69 year old male presents the emergency room by EMS for evaluation of syncope.  He has had 3 different episodes in the last week or so where he has been outside exerting himself for extended hours and  incredibly hot temperatures and sweating profusely which have resulted in him passing out.  He has no symptoms whatsoever when he is not under exertion or activity.  He has no chest pain cough or hemoptysis and his symptoms are absent currently.  He had an aortic valve replacement last year and has been fully recovered from that resuming his vigorous and active lifestyle.  He takes no beta-blockers or other heart medications apart from a baby aspirin..        Review of Systems   Constitutional symptoms:  No fever, no chills.    Eye symptoms:  Vision unchanged, No diplopia,    ENMT symptoms:  No ear pain, no sore throat.    Respiratory symptoms:  No shortness of breath, no cough.    Cardiovascular symptoms:  No chest pain, no palpitations, no diaphoresis, no peripheral edema.    Gastrointestinal symptoms:  No vomiting, no diarrhea.    Genitourinary symptoms:  No dysuria, no hematuria.    Neurologic symptoms:  No numbness, no tingling, no focal weakness.              Additional review of systems information: All other systems reviewed and otherwise negative.      Past Medical/ Family/ Social History   Medical history: Reviewed as documented in chart.   Surgical history: Reviewed as documented in chart.   Family history: Not significant.   Social history: Reviewed as documented in chart.   Problem list:    Active Problems (7)  Aortic valve stenosis   BPH (benign prostatic hyperplasia)   Dental crowns status   H/O: HTN (hypertension)   High cholesterol   Hypothyroid   Tongue cancer   .  Physical Examination               Vital Signs   Vital Signs   07/12/2020 15:00 EDT Systolic Blood Pressure 112 mmHg    Diastolic Blood Pressure 75 mmHg    Peripheral Pulse Rate 83 bpm    Heart Rate Monitored 83 bpm    Respiratory Rate 14 br/min    Mean Arterial Pressure, Cuff 85 mmHg    SpO2 95 %   07/12/2020 14:31 EDT Systolic Blood Pressure 102 mmHg    Diastolic Blood Pressure 73 mmHg    Peripheral Pulse Rate 93 bpm    Heart Rate  Monitored 89 bpm    Respiratory Rate 17 br/min    Mean Arterial Pressure, Cuff 83 mmHg    SpO2 99 %   07/12/2020 14:17 EDT Systolic Blood Pressure 137 mmHg    Diastolic Blood Pressure 78 mmHg    Temperature Oral 37.0 degC    Heart Rate Monitored 87 bpm    Respiratory Rate 16 br/min    SpO2 97 %   .   Oxygen saturation.   General:  Alert, no acute distress.    Skin:  Warm, dry, pink, intact.    Head:  Normocephalic, atraumatic.    Neck:  Supple, trachea midline.    Eye:  Pupils are equal, round and reactive to light, extraocular movements are intact, normal conjunctiva.    Ears, nose, mouth and throat:  Oral mucosa moist.   Cardiovascular:  Normal peripheral perfusion.   Respiratory:  Respirations are non-labored.   Back:  Normal range of motion.   Musculoskeletal:  Normal ROM, normal strength.    Chest wall   Gastrointestinal:  Soft, Nontender, Non distended.    Neurological:  Alert and oriented to person, place, time, and situation, No focal neurological deficit observed.    Lymphatics   Psychiatric:  Cooperative, appropriate mood & affect.       Medical Decision Making   Documents reviewed:  Emergency department nurses' notes, flowsheet, emergency department records, prior records, vital signs.    Cardiac monitor:  Normal sinus rhythm the 80s with no ectopy.  Oxygen saturation is 98% on room air which is normal and acceptable..   Electrocardiogram:  Normal sinus rhythm with first-degree block at 80 bpm normal intervals normal axis normal ST and T wave morphology diffusely with no evidence of acute ischemia.  He has inferior Q waves with no old for comparison.SABRA   Results review:  Lab results : Lab View   07/12/2020 15:09 EDT Appear U POC Clear    Color U POC Yellow    Bili U POC Negative    Blood U POC Negative    Glucose U POC Negative mg/dL    Ketones U POC Trace mg/dL    Leuk Est U POC Negative    Nitrite U POC Negative    pH U POC 6.5    Protein U POC Negative    Spec Grav U POC >=1.030    Urobilin U POC 0.2 EU/dL    0/01/7977 85:42 EDT Estimated Creatinine Clearance 87.35 mL/min   07/12/2020 14:24 EDT WBC 5.9 x10e3/mcL    RBC 4.32 x10e6/mcL    Hgb 14.3 g/dL    HCT 57.5 %    MCV 01.8 fL    MCH 33.1 pg    MCHC 33.7 g/dL    RDW 88.4 %    Platelet 185 x10e3/mcL    MPV 9.8 fL    Neutro Auto 74.8 %  HI    Neutro Absolute 4.4 x10e3/mcL    Immature Grans Percent 0.2 %    Immature Grans Absolute 0.01 x10e3/mcL    Lymph Auto 15.1 %    Lymph Absolute 0.9 x10e3/mcL  LOW    Mono Auto 7.6 %    Mono Absolute 0.5 x10e3/mcL    Eosinophil Percent 2.0 %    Eos Absolute 0.1 x10e3/mcL    Basophil Auto 0.3 %    Baso Absolute 0.0 x10e3/mcL    PT 14.3 seconds    INR 1.1  LOW    D Dimer 0.56 mcg/mL FEU  HI    Sodium Lvl 139 mmol/L    Potassium Lvl 3.8 mmol/L    Chloride 101 mmol/L    CO2 22 mmol/L    Glucose Random 128 mg/dL  HI    BUN 26 mg/dL  HI    Creatinine Lvl 0.9 mg/dL    AGAP 16 mmol/L    Osmolality Calc 284 mOsm/kg    Calcium Lvl 9.4 mg/dL    eGFR AA 898 fO/fpw/8.26f    eGFR Non-AA 87 mL/min/1.31m  LOW    Magnesium Lvl 2.2 mg/dL    Trop T Quant <9.989 ng/mL   07/12/2020 14:21 EDT Estimated Creatinine Clearance 98.27 mL/min   .   Radiology results:  Rad Results (ST)   XR Chest 1 View Portable  ?  07/12/20 14:50:03  HISTORY: Syncope;Other (please specify)    COMPARISON: 06/30/2019 PET CT    FINDINGS: Single portable AP radiograph of the chest was performed. Lung volumes  are low. There is left base atelectasis. Mild interstitial prominence is  present, emphasized by low inspiratory state. Cardiomediastinal silhouette is  unremarkable. There is no pneumothorax. Osseous structures are intact.    IMPRESSION: Low lung volumes, emphasizing interstitial prominence. Left base  atelectasis  ?  Signed By: JACKSON IHA A-MD  .      Reexamination/ Reevaluation   Time: 07/12/2020 17:10:00 .   Notes: She remains asymptomatic feels well is amatory in department with no symptoms whatsoever.  He has had no dysrhythmias.  He does have a fit bit which he may be  able to use until he can see his cardiologist in the next day or 2.  Return precautions are discussed and all questions have been answered.  He declines any further investigation or evaluation emergency room adamantly declines being mated to the hospital during the coronavirus pandemic.  He is doubly vaccinated..      Impression and Plan   Diagnosis   Syncope and collapse (ICD10-CM R55, Discharge, Medical)   Plan   Condition: Stable.    Disposition: Discharged: Time  07/12/2020 17:10:00, to home.

## 2020-07-12 NOTE — ED Provider Notes (Signed)
Syncope/Near syncope        Patient:   Ruben [name], Ruben [name]            MRN: [id number]            FIN: [id number]               Age:   69 years     Sex:  Male     DOB:  February 08, 1951   Associated Diagnoses:   Syncope and collapse   Author:   Oren Binet,  Tannisha Kennington B-MD      History of Present Illness   The patient presents with syncope, near syncope and 69 year old male presents the emergency room by EMS for evaluation of syncope.  He has had 3 different episodes in the last week or so where he has been outside exerting himself for extended hours and incredibly hot temperatures and sweating profusely which have resulted in him passing out.  He has no symptoms whatsoever when he is not under exertion or activity.  He has no chest pain cough or hemoptysis and his symptoms are absent currently.  He had an aortic valve replacement last year and has been fully recovered from that resuming his vigorous and active lifestyle.  He takes no beta-blockers or other heart medications apart from a baby aspirin..        Review of Systems   Constitutional symptoms:  No fever, no chills.    Eye symptoms:  Vision unchanged, No diplopia,    ENMT symptoms:  No ear pain, no sore throat.    Respiratory symptoms:  No shortness of breath, no cough.    Cardiovascular symptoms:  No chest pain, no palpitations, no diaphoresis, no peripheral edema.    Gastrointestinal symptoms:  No vomiting, no diarrhea.    Genitourinary symptoms:  No dysuria, no hematuria.    Neurologic symptoms:  No numbness, no tingling, no focal weakness.              Additional review of systems information: All other systems reviewed and otherwise negative.      Past Medical/ Family/ Social History   Medical history: Reviewed as documented in chart.   Surgical history: Reviewed as documented in chart.   Family history: Not significant.   Social history: Reviewed as documented in chart.   Problem list:    Active Problems (7)  Aortic valve stenosis   BPH (benign prostatic hyperplasia)   Dental  crowns status   H/O: HTN (hypertension)   High cholesterol   Hypothyroid   Tongue cancer   .      Physical Examination               Vital Signs   Vital Signs   12/08/1939 74:08 EDT Systolic Blood Pressure 144 mmHg    Diastolic Blood Pressure 75 mmHg    Peripheral Pulse Rate 83 bpm    Heart Rate Monitored 83 bpm    Respiratory Rate 14 br/min    Mean Arterial Pressure, Cuff 85 mmHg    SpO2 95 %   06/06/8562 14:97 EDT Systolic Blood Pressure 026 mmHg    Diastolic Blood Pressure 73 mmHg    Peripheral Pulse Rate 93 bpm    Heart Rate Monitored 89 bpm    Respiratory Rate 17 br/min    Mean Arterial Pressure, Cuff 83 mmHg    SpO2 99 %   01/11/8587 50:27 EDT Systolic Blood Pressure 741 mmHg    Diastolic Blood Pressure 78 mmHg  Temperature Oral 37.0 degC    Heart Rate Monitored 87 bpm    Respiratory Rate 16 br/min    SpO2 97 %   .   Oxygen saturation.   General:  Alert, no acute distress.    Skin:  Warm, dry, pink, intact.    Head:  Normocephalic, atraumatic.    Neck:  Supple, trachea midline.    Eye:  Pupils are equal, round and reactive to light, extraocular movements are intact, normal conjunctiva.    Ears, nose, mouth and throat:  Oral mucosa moist.   Cardiovascular:  Normal peripheral perfusion.   Respiratory:  Respirations are non-labored.   Back:  Normal range of motion.   Musculoskeletal:  Normal ROM, normal strength.    Chest wall   Gastrointestinal:  Soft, Nontender, Non distended.    Neurological:  Alert and oriented to person, place, time, and situation, No focal neurological deficit observed.    Lymphatics   Psychiatric:  Cooperative, appropriate mood & affect.       Medical Decision Making   Documents reviewed:  Emergency department nurses' notes, flowsheet, emergency department records, prior records, vital signs.    Cardiac monitor:  Normal sinus rhythm the 80s with no ectopy.  Oxygen saturation is 98% on room air which is normal and acceptable..   Electrocardiogram:  Normal sinus rhythm with first-degree block at  80 bpm normal intervals normal axis normal ST and T wave morphology diffusely with no evidence of acute ischemia.  He has inferior Q waves with no old for comparison.Marland Kitchen   Results review:  Lab results : Lab View   07/12/2020 15:09 EDT Appear U POC Clear    Color U POC Yellow    Bili U POC Negative    Blood U POC Negative    Glucose U POC Negative mg/dL    Ketones U POC Trace mg/dL    Leuk Est U POC Negative    Nitrite U POC Negative    pH U POC 6.5    Protein U POC Negative    Spec Grav U POC >=1.030    Urobilin U POC 0.2 EU/dL   07/12/2020 14:57 EDT Estimated Creatinine Clearance 87.35 mL/min   07/12/2020 14:24 EDT WBC 5.9 x10e3/mcL    RBC 4.32 x10e6/mcL    Hgb 14.3 g/dL    HCT 42.4 %    MCV 98.1 fL    MCH 33.1 pg    MCHC 33.7 g/dL    RDW 11.5 %    Platelet 185 x10e3/mcL    MPV 9.8 fL    Neutro Auto 74.8 %  HI    Neutro Absolute 4.4 x10e3/mcL    Immature Grans Percent 0.2 %    Immature Grans Absolute 0.01 x10e3/mcL    Lymph Auto 15.1 %    Lymph Absolute 0.9 x10e3/mcL  LOW    Mono Auto 7.6 %    Mono Absolute 0.5 x10e3/mcL    Eosinophil Percent 2.0 %    Eos Absolute 0.1 x10e3/mcL    Basophil Auto 0.3 %    Baso Absolute 0.0 x10e3/mcL    PT 14.3 seconds    INR 1.1  LOW    D Dimer 0.56 mcg/mL FEU  HI    Sodium Lvl 139 mmol/L    Potassium Lvl 3.8 mmol/L    Chloride 101 mmol/L    CO2 22 mmol/L    Glucose Random 128 mg/dL  HI    BUN 26 mg/dL  HI    Creatinine Lvl 0.9  mg/dL    AGAP 16 mmol/L    Osmolality Calc 284 mOsm/kg    Calcium Lvl 9.4 mg/dL    eGFR AA 101 mL/min/1.75m???    eGFR Non-AA 87 mL/min/1.765m??  LOW    Magnesium Lvl 2.2 mg/dL    Trop T Quant <0.010 ng/mL   07/12/2020 14:21 EDT Estimated Creatinine Clearance 98.27 mL/min   .   Radiology results:  Rad Results (ST)   XR Chest 1 View Portable  ?  07/12/20 14:50:03  HISTORY: Syncope;Other (please specify)    COMPARISON: 06/30/2019 PET CT    FINDINGS: Single portable AP radiograph of the chest was performed. Lung volumes  are low. There is left base atelectasis. Mild  interstitial prominence is  present, emphasized by low inspiratory state. Cardiomediastinal silhouette is  unremarkable. There is no pneumothorax. Osseous structures are intact.    IMPRESSION: Low lung volumes, emphasizing interstitial prominence. Left base  atelectasis  ?  Signed By: GAOtelia Limes-MD  .      Reexamination/ Reevaluation   Time: 07/12/2020 17:10:00 .   Notes: She remains asymptomatic feels well is amatory in department with no symptoms whatsoever.  He has had no dysrhythmias.  He does have a fit bit which he may be able to use until he can see his cardiologist in the next day or 2.  Return precautions are discussed and all questions have been answered.  He declines any further investigation or evaluation emergency room adamantly declines being mated to the hospital during the coronavirus pandemic.  He is doubly vaccinated..      Impression and Plan   Diagnosis   Syncope and collapse (ICD10-CM R55, Discharge, Medical)   Plan   Condition: Stable.    Disposition: Discharged: Time  07/12/2020 17:10:00, to home.    Signature Line     Electronically Signed on 07/12/2020 05:11 PM EDT   ________________________________________________   BLJanora Norlander-MD               Modified by: BLJanora Norlander-MD on 07/12/2020 05:11 PM EDT

## 2020-07-12 NOTE — Nursing Note (Signed)
Orthostatics - Text       Orthostatics Entered On:  07/12/2020 14:31 EDT    Performed On:  07/12/2020 14:28 EDT by Berneda Rose, RN, Murrell Redden               Orthostatics   Systolic/Diastolic  Supine BP :   122 mmHg   Systolic/Diastolic  Supine BP :   66 mmHg   Pulse Supine :   77 bpm   Systolic/Diastolic  Standing BP :   102 mmHg   Systolic/Diastolic  Standing BP :   73 mmHg   Pulse Standing :   92 bpm   Systolic/Diastolic  Sitting BP :   112 mmHg   Systolic/Diastolic  Sitting BP :   75 mmHg   Pulse Sitting :   93 bpm   Tomi Likens A - 07/12/2020 14:28 EDT

## 2020-07-12 NOTE — ED Notes (Signed)
 ED Triage Note       ED Secondary Triage Entered On:  07/12/2020 14:24 Ruben [name] [name]    Performed On:  07/12/2020 14:21 Ruben [name] [name] by Myrick, [name], Ruben [name] Ruben [name] [name] LABOR               General Information   Barriers to Learning :   None evident   Languages :   English   Pt. Currently Receiving Radiation :   No   COVID-19 Vaccine Status :   2 Doses received   2 Doses Received Date :   02/16/2020 Ruben [name] [name]   2 Doses Received Manufacturer :   Pfizer vaccine   ED Home Meds Section :   Document assessment   [facility] ED Fall Risk Section :   Document assessment   ED History Section :   Document assessment   ED Advance Directives Section :   Document assessment   ED Palliative Screen :   Document assessment   Ruben [name] Ruben [name] Ruben [name] [name] LABOR - 07/12/2020 14:21 Ruben [name] [name]   (As Of: 07/12/2020 14:24:09 Ruben [name] [name])   Problems(Active)    Aortic valve stenosis (SNOMED CT  :899366986 )  Name of Problem:   Aortic valve stenosis ; Recorder:   TURNER, [name], Ruben [name] [name]; Confirmation:   Confirmed ; Classification:   Patient Stated ; Code:   [id number] ; Contributor System:   PowerChart ; Last Updated:   03/15/2017 9:47 Ruben [name] [name] ; Life Cycle Date:   02/08/2017 ; Life Cycle Status:   Active ; Vocabulary:   SNOMED CT   ; Comments:        03/15/2017 9:47 - Velma, [name], Ruben [name] Ruben [name] [name]  pt states was told moderatestenosis per dr loni. Does not cause any symptoms at this time    03/15/2017 9:59 - Velma, [name], Ruben [name] Ruben [name] [name]  pt said he had no problems with anesthesia that lasted about the same length of time as the surgery in Apr'18 and Dr loni had already cleared him for the surgery in Apr'18-notes in [employer].      BPH (benign prostatic hyperplasia) (SNOMED CT  :603268987 )  Name of Problem:   BPH (benign prostatic hyperplasia) ; Recorder:   TURNER, [name], Ruben [name] [name]; Confirmation:   Confirmed ; Classification:   Patient Stated ; Code:   [id number] ; Contributor System:   PowerChart ; Last Updated:   02/08/2017 14:41 Ruben [name] [name] ; Life Cycle Date:   02/08/2017 ; Life Cycle Status:   Active ; Vocabulary:   SNOMED CT        Dental crowns status (SNOMED  CT  :[id number] )  Name of Problem:   Dental crowns status ; Recorder:   TURNER, [name], Ruben [name] [name]; Confirmation:   Confirmed ; Classification:   Patient Stated ; Code:   [id number] ; Contributor System:   PowerChart ; Last Updated:   02/08/2017 14:42 Ruben [name] [name] ; Life Cycle Date:   02/08/2017 ; Life Cycle Status:   Active ; Vocabulary:   SNOMED CT        H/O: HTN (hypertension) (SNOMED CT  :748325985 )  Name of Problem:   H/O: HTN (hypertension) ; Recorder:   TURNER, [name], Ruben [name] [name]; Confirmation:   Confirmed ; Classification:   Patient Stated ; Code:   [id number] ; Contributor System:   PowerChart ; Last Updated:   02/08/2017 14:40 Ruben [name] [name] ; Life Cycle Date:   02/08/2017 ; Life Cycle Status:   Active ; Vocabulary:   SNOMED CT        High cholesterol (SNOMED CT  :76716984 )  Name  of Problem:   High cholesterol ; Recorder:   TURNER, [name], Ruben [name] [name]; Confirmation:   Confirmed ; Classification:   Patient Stated ; Code:   [id number] ; Contributor System:   PowerChart ; Last Updated:   02/08/2017 14:39 Ruben [name] [name] ; Life Cycle Date:   02/08/2017 ; Life Cycle Status:   Active ; Vocabulary:   SNOMED CT        Hypothyroid (SNOMED CT  :31731988 )  Name of Problem:   Hypothyroid ; Recorder:   TURNER, [name], Ruben [name] [name]; Confirmation:   Confirmed ; Classification:   Patient Stated ; Code:   [id number] ; Contributor System:   PowerChart ; Last Updated:   02/08/2017 14:39 Ruben [name] [name] ; Life Cycle Date:   02/08/2017 ; Life Cycle Status:   Active ; Vocabulary:   SNOMED CT        Tongue cancer (SNOMED CT  :517452989 )  Name of Problem:   Tongue cancer ; Onset Date:   02/04/2017 ; Recorder:   Ruben [name] Ruben [name] [name], Ruben [name] Ruben [name] [name]; Confirmation:   Confirmed ; Classification:   Patient Stated ; Code:   [id number] ; Contributor System:   PowerChart ; Last Updated:   03/15/2017 9:46 Ruben [name] [name] ; Life Cycle Date:   03/15/2017 ; Life Cycle Status:   Active ; Vocabulary:   SNOMED CT   ; Comments:        03/15/2017 9:46 - Velma, [name], Ruben [name] Ruben [name] [name]  had first surgery in Apr'18-now for Left  Cervical Lymphadenectomy on 03/20/17        Diagnoses(Active)    Syncope/Near syncope  Date:   07/12/2020 ; Diagnosis Type:   Reason For Visit ; Confirmation:   Complaint of ; Clinical Dx:   Syncope/Near syncope ; Classification:   Medical ; Clinical Service:   Emergency medicine ; Code:   PNED ; Probability:   0 ; Diagnosis Code:   [id number]             -    Procedure History   (As Of: 07/12/2020 14:24:09 Ruben [name] [name])     Procedure Dt/Tm:   02/13/2017 12:57:00 Ruben [name] [name] ; Location:   [facility] ; Provider:   DONALYNN LOVING; Anesthesia Type:   General ; Anesthesia Minutes:   0 ; Procedure Name:   Glossectomy (Partial) ; Procedure Minutes:   61 ; Comments:     02/13/2017 14:32 Ruben [name] [name] - Hayward, [name], Aloha  auto-populated from documented surgical case ; Clinical Service:   Surgery ; Last Reviewed Dt/Tm:   07/12/2020 14:24:06 Ruben [name] [name]            Procedure Dt/Tm:   02/13/2017 12:57:00 Ruben [name] [name] ; Location:   [facility] ; Provider:   DONALYNN LOVING; Anesthesia Type:   General ; Anesthesia Minutes:   0 ; Procedure Name:   Skin Graft Split Thickness ; Procedure Minutes:   83 ; Comments:     02/13/2017 14:32 Ruben [name] [name] - Hayward, [name], Aloha  auto-populated from documented surgical case ; Clinical Service:   Surgery ; Last Reviewed Dt/Tm:   07/12/2020 14:24:06 Ruben [name] [name]            Anesthesia Minutes:   0 ; Procedure Name:   Wisdom tooth ; Procedure Minutes:   0 ; Last Reviewed Dt/Tm:   07/12/2020 14:24:06 Ruben [name] [name]            Anesthesia Minutes:   0 ; Procedure Name:   Colonoscopy ; Procedure Minutes:   0 ; Last Reviewed Dt/Tm:   07/12/2020 14:24:06 Ruben [name] [name]  Procedure Dt/Tm:   03/20/2017 13:08:00 Ruben [name] [name] ; Location:   [facility] ; Provider:   DONALYNN LOVING; Anesthesia Type:   General ; :   SLATTERY-MD,  STEPHEN O; Anesthesia Minutes:   0 ; Procedure Name:   Neck Dissection (Left, Cervical) ; Procedure Minutes:   136 ; Comments:     03/20/2017 15:37 Ruben [name] [name] - Marty, [name], Lucie CROME  auto-populated from documented surgical case ; Clinical Service:   Surgery ; Last  Reviewed Dt/Tm:   07/12/2020 14:24:06 Ruben [name] [name]            Anesthesia Minutes:   0 ; Procedure Name:   Dupuytren contracture of right palm ; Procedure Minutes:   0 ; Last Reviewed Dt/Tm:   07/12/2020 14:24:06 Ruben [name] [name]            [facility] Fall Risk Assessment Tool   Hx of falling last 3 months ED Fall :   No   Patient confused or disoriented ED Fall :   No   Patient intoxicated or sedated ED Fall :   No   Patient impaired gait ED Fall :   No   Use a mobility assistance device ED Fall :   No   Patient altered elimination ED Fall :   No   [facility] ED Fall Score :   0    Ruben [name] Ruben [name] [name]   ED Advance Directive   Advance Directive :   No   Myrick [name], Ruben [name] Ruben [name] [name] Ruben [name] [name] - 07/12/2020 14:21 Ruben [name] [name]   Palliative Care   Does the Patient have a Life Limiting Illness :   None of the above   Ruben [name], Training and development officer - 07/12/2020 14:21 Ruben [name] [name]   Social History   Social History   (As Of: 07/12/2020 14:24:09 Ruben [name] [name])   Tobacco:        Never smoker   (Last Updated: 02/13/2017 11:17:13 Ruben [name] [name] by Jearld [name], Olam SAUNDERS)          Alcohol:        Past   Comments:  02/13/2017 11:17 - Jearld, [name], Olam SAUNDERS: NONE IN 10 YEARS   (Last Updated: 02/13/2017 11:17:49 Ruben [name] [name] by Jearld, [name], Olam SAUNDERS)          Substance Use:        Denies   (Last Updated: 02/08/2017 14:44:20 Ruben [name] [name] by SHLOMO, [name], Ruben [name] [name])            Med Hx   Medication List   (As Of: 07/12/2020 14:24:09 Ruben [name] [name])   Normal Order    Sodium Chloride 0.9% intravenous solution Bolus  :   Sodium Chloride 0.9% intravenous solution Bolus ; Status:   Ordered ; Ordered As Mnemonic:   Sodium Chloride 0.9% bolus ; Simple Display Line:   1,000 mL, 1000 mL/hr, IV Piggyback, Once ; Ordering Provider:   BLUE,  MATTHEW B-MD; Catalog Code:   Sodium Chloride 0.9% ; Order Dt/Tm:   07/12/2020 14:12:49 Ruben [name] [name]            Home Meds    aspirin  :   aspirin ; Status:   Documented ; Ordered As Mnemonic:   aspirin ; Simple Display Line:   81 mg, Oral, Daily, PRN: fever, 0 Refill(s) ; Catalog Code:   aspirin ; Order Dt/Tm:   07/12/2020 14:23:22 Ruben [name] [name]          ibuprofen   :   ibuprofen ; Status:   Documented ; Ordered As Mnemonic:   ibuprofen ; Simple  Display Line:   600 mg, Oral, QID, PRN: mild pain (1-3), 0 Refill(s) ; Catalog Code:   ibuprofen ; Order Dt/Tm:   07/12/2020 14:23:04 Ruben [name] [name]          levothyroxine  :   levothyroxine ; Status:   Documented ; Ordered As Mnemonic:   levothyroxine ; Simple Display Line:   200 mcg, Oral, qAM, 0 Refill(s) ; Catalog Code:   levothyroxine ; Order Dt/Tm:   07/12/2020 14:22:58 Ruben [name] [name]          silodosin  :   silodosin ; Status:   Documented ; Ordered As Mnemonic:   Rapaflo 8 mg oral capsule ; Simple Display Line:   8 mg, 1 caps, Oral, qPM, 0 Refill(s) ; Catalog Code:   silodosin ; Order Dt/Tm:   07/12/2020 14:23:09 Ruben [name] [name]          simvastatin  :   simvastatin ; Status:   Completed ; Ordered As Mnemonic:   Zocor ; Simple Display Line:   40 mg, Oral, Once a Day (at bedtime), 0 Refill(s) ; Catalog Code:   simvastatin ; Order Dt/Tm:   02/08/2017 14:36:41 Ruben [name] [name]          venlafaxine  :   venlafaxine ; Status:   Documented ; Ordered As Mnemonic:   venlafaxine 75 mg oral capsule, extended release ; Simple Display Line:   75 mg, 1 caps, Oral, qPM, 30 caps, 0 Refill(s) ; Ordering Provider:   DENNARD MERILEE CROME; Catalog Code:   venlafaxine ; Order Dt/Tm:   02/08/2017 14:37:22 Ruben [name] [name] ; Comment:   DO NOT CRUSH

## 2020-07-12 NOTE — ED Notes (Signed)
ED Pre-Arrival Note        Pre-Arrival Summary    Name:  Medic,    Current Date:  07/12/2020 14:16:26 EDT  Gender:    Date of Birth:    Age:    Pre-Arrival Type:  EMS  ETA:  07/12/2020 14:24:00 EDT  Primary Care Physician:    Presenting Problem:  syncope  Pre-Arrival User:  ADAMS, RN, LISA M  Referring Source:    Location:  [facility]            PreArrival Communication Form  Emergency Department        Additional Patient Information:        Orders:  [    ] CBC                                            [     ] CT Head no contrast  [    ] BMP                                           [     ] CT Abdomen/Pelvis no contrast  [    ] PT/INR                                       [     ] CT Abdomen/Pelvis IV contrast, w/ oral contrast  [    ] Troponin                                   [     ] CT Abdomen/Pelvis IV contrast, no oral contrast  [    ] BNP                                            [     ] See ordersheet  [    ] CXR                                             [     ] Other:__________________________  [    ] EKG

## 2020-07-12 NOTE — ED Notes (Signed)
 ED Patient Summary       ;       [name]  [address] [place], [place] [address]  [phone]  Discharge Instructions (Patient)  [facility]en [name], [name]  DOB:  1951/06/06                   MRN: [id number]                   FIN: [id number]  Reason For Visit: Syncope/Near syncope; [name]  Final Diagnosis: Syncope and collapse     Visit Date: 07/12/2020 14:10:00  Address: [address] [place] [address]  Phone: [phone]     Emergency Department Providers:         Primary Physician:      [name]      [name] would like to thank you for allowing us  to assist you with your healthcare needs. The following includes patient education materials and information regarding your injury/illness.     Follow-up Instructions:  You were seen today on an emergency basis. Please contact your primary care doctor for a follow up appointment. If you received a referral to a specialist doctor, it is important you follow-up as instructed.    It is important that you call your follow-up doctor to schedule and confirm the location of your next appointment. Your doctor may practice at multiple locations. The office location of your follow-up appointment may be different to the one written on your discharge instructions.    If you do not have a primary care doctor, please call [phone] for help in finding a [name]. [name]. For help in finding a specialist doctor, please call [phone].    If your condition gets worse before your follow-up with your primary care doctor or specialist, please return to the Emergency Department.      Coronavirus 2019 (COVID-19) Reminders:     Patients age 76 - 44, with parental consent, and patients over age 4 can make an appointment for a COVID-19 vaccine. Patients can contact their [name] doctors' offices to schedule an appointment to receive the COVID-19 vaccine. Patients who  do not have a [name] physician can call [phone] to schedule vaccination appointments.      Follow Up Appointments:  Primary Care [name]:     Name: [name]     Phone: [phone]                 With: Address: When:   See your cardiologist tomorrow for close follow-up. Return here if acutely worse or any other concerns or problems.  Within 1 to 2 days   Comments:   Return to ED if symptoms worsen   Return to ED if symptoms worsen                   Medications that have not changed  Other Medications  aspirin 81 Milligram Oral (given by mouth) every day as needed fever.  Last Dose:____________________  ibuprofen 600 Milligram Oral (given by mouth) 4 times a day as needed mild pain (1-3).  Last Dose:____________________  levothyroxine 200 Microgram Oral (given by mouth) once a day (in the morning).  Last Dose:____________________  silodosin (Rapaflo 8 mg oral capsule) 1 Capsules Oral (given by mouth) once a day (in the evening).  Last Dose:____________________  venlafaxine (venlafaxine 75 mg oral capsule,  extended release) 1 Capsules Oral (given by mouth) once a day (in the evening)., DO NOT CRUSH  Last Dose:____________________      Allergy Info: No Known Allergies     >Discharge Additional Information          Discharge Patient 07/12/20 17:11:00 EDT      Patient Education Materials:        Syncope    Syncope is when you lose temporarily pass out (faint). Signs that you may be about to pass out include:       Feeling dizzy or light-headed.     Feeling sick to your stomach (nauseous).     Seeing all white or all black.     Having cold, clammy skin.    If you passed out, get help right away. Call your local emergency services (911 in the [place] Do not drive yourself to the [name].    HOME CARE    Pay attention to any changes in your symptoms. Take these actions to help with your condition:     Have someone stay with you until you feel stable.     Do not drive, use machinery, or  play sports until your doctor says it is okay.     Keep all follow-up visits as told by your doctor. This is important.     If you start to feel like you might pass out, lie down right away and raise (elevate) your feet above the level of your heart. Breathe deeply and steadily. Wait until all of the symptoms are gone.     Drink enough fluid to keep your pee (urine) clear or pale yellow.     If you are taking blood pressure or heart medicine, get up slowly and spend many minutes getting ready to sit and then stand. This can help with dizziness.     Take over-the-counter and prescription medicines only as told by your doctor.    GET HELP RIGHT AWAY IF:     You have a very bad headache.     You have unusual pain in your chest, tummy, or back.     You are bleeding from your mouth or rectum.     You have black or tarry poop (stool).     You have a very fast or uneven heartbeat (palpitations).     It hurts to breathe.     You pass out once or more than once.     You have jerky movements that you cannot control (seizure).     You are confused.     You have trouble walking.     You are very weak.     You have vision problems.    These symptoms may be an emergency. Do not wait to see if the symptoms will go away. Get medical help right away. Call your local emergency services (911 in the [place] Do not drive yourself to the [name].    This information is not intended to replace advice given to you by your health care [name]. Make sure you discuss any questions you have with your health care [name].    Document Released: 04/10/2008 Document Revised: 10/04/2015 Document Reviewed: 07/07/2015  Elsevier Interactive Patient Education ?2016 Elsevier Inc.      ---------------------------------------------------------------------------------------------------------------------  [name]) encourages you to self-enroll in the [name] Patient Portal.  [name] will allow  you to manage your personal health information securely from your own electronic device now  and in the future.  To begin your Patient Portal enrollment process, please visit [email or web address] Click on "Sign up now" under [name].  If you find that you need additional assistance on the [name] Patient Portal or need a copy of your medical records, please call the [name] Records Office at [phone].  Comment:

## 2020-09-07 LAB — POC URINALYSIS, CHEMISTRY
Bilirubin, Urine, POC: NEGATIVE
Blood, UA POC: NEGATIVE
Glucose, UA POC: NEGATIVE mg/dL
Ketones, Urine, POC: NEGATIVE mg/dL
Leukocytes, UA: NEGATIVE
Nitrate, UA POC: NEGATIVE
Protein, Urine, POC: NEGATIVE
Specific Gravity, Urine, POC: 1.02 (ref 1.003–1.035)
UROBILIN U POC: 0.2 EU/dL
pH, Urine, POC: 7 (ref 4.5–8.0)

## 2020-09-07 LAB — CBC WITH AUTO DIFFERENTIAL
Absolute Baso #: 0 10*3/uL (ref 0.0–0.2)
Absolute Eos #: 0.1 10*3/uL (ref 0.0–0.5)
Absolute Lymph #: 0.9 10*3/uL — ABNORMAL LOW (ref 1.0–3.2)
Absolute Mono #: 0.4 10*3/uL (ref 0.3–1.0)
Basophils %: 0.6 % (ref 0.0–2.0)
Eosinophils %: 2.6 % (ref 0.0–7.0)
Hematocrit: 42.5 % (ref 38.0–52.0)
Hemoglobin: 14.1 g/dL (ref 13.0–17.3)
Immature Grans (Abs): 0.01 10*3/uL (ref 0.00–0.06)
Immature Granulocytes: 0.2 % (ref 0.1–0.6)
Lymphocytes: 17.6 % (ref 15.0–45.0)
MCH: 32.5 pg (ref 27.0–34.5)
MCHC: 33.2 g/dL (ref 32.0–36.0)
MCV: 97.9 fL (ref 84.0–100.0)
MPV: 9.4 fL (ref 7.2–13.2)
Monocytes: 8.1 % (ref 4.0–12.0)
Neutrophils %: 70.9 % (ref 42.0–74.0)
Neutrophils Absolute: 3.6 10*3/uL (ref 1.6–7.3)
Platelets: 218 10*3/uL (ref 140–440)
RBC: 4.34 x10e6/mcL (ref 4.00–5.60)
RDW: 11.9 % (ref 11.0–16.0)
WBC: 5.1 10*3/uL (ref 3.8–10.6)

## 2020-09-07 LAB — BASIC METABOLIC PANEL
Anion Gap: 12 mmol/L (ref 2–17)
BUN: 21 mg/dL (ref 8–23)
CO2: 26 mmol/L (ref 22–29)
Calcium: 9 mg/dL (ref 8.8–10.2)
Chloride: 101 mmol/L (ref 98–107)
Creatinine: 0.8 mg/dL (ref 0.7–1.3)
GFR African American: 106 mL/min/{1.73_m2} (ref >=90)
GFR Non-African American: 92 mL/min/{1.73_m2} (ref >=90)
Glucose: 120 mg/dL — ABNORMAL HIGH (ref 70–99)
OSMOLALITY CALCULATED: 281 mOsm/kg (ref 270–287)
Potassium: 3.9 mmol/L (ref 3.5–5.3)
Sodium: 139 mmol/L (ref 135–145)

## 2020-09-07 LAB — ADD ON LAB TEST

## 2020-09-07 LAB — COVID-19: SARS-CoV-2: NOT DETECTED

## 2020-09-07 LAB — T4, FREE: T4 Free: 1.79 ng/dL — ABNORMAL HIGH (ref 0.82–1.70)

## 2020-09-07 LAB — TSH WITH REFLEX TO FT4: TSH: 0.223 mcIU/mL — ABNORMAL LOW (ref 0.358–3.740)

## 2020-09-07 LAB — TROPONIN T
Troponin T: 0.02 ng/mL — ABNORMAL HIGH (ref 0.000–0.010)
Troponin T: 0.843 ng/mL (ref 0.000–0.010)

## 2020-09-07 NOTE — ED Notes (Signed)
 ED Triage Note       ED Triage Adult Entered On:  09/07/2020 11:21 EDT    Performed On:  09/07/2020 11:14 EDT by KOMINE, RN, ALISA POUR               Triage   Numeric Rating Pain Scale :   0 = No pain   KEE, RN, GAIL K - 09/07/2020 11:14 EDT   Chief Complaint :   Patient states he had a syncopal episode and fell off a chair.  Denies injury.  Seen on Labor Day for syncopal episode and has had 4 episodes since then.  States he feels pain across chest prior    KOMINE, RN, GAIL K - 09/07/2020 11:23 EDT     Ozie Mode of Arrival :   Ambulance   Infectious Disease Documentation :   Document assessment   Temperature Oral :   36.6 degC(Converted to: 97.9 degF)    Heart Rate Monitored :   69 bpm   Respiratory Rate :   16 br/min   Systolic Blood Pressure :   123 mmHg   Diastolic Blood Pressure :   82 mmHg   SpO2 :   98 %   Oxygen Therapy :   Room air   Patient presentation :   None of the above   Chief Complaint or Presentation suggest infection :   No   Dosing Weight Obtained By :   Patient stated   Weight Dosing :   92.9 kg(Converted to: 204 lb 13 oz)    Height :   182 cm(Converted to: 6 ft 0 in)    Body Mass Index Dosing :   28 kg/m2   KEE RN, ALISA POUR - 09/07/2020 11:14 EDT   DCP GENERIC CODE   Tracking Group :   ED [facility] Tracking Group   Tracking Acuity :   3   KEE, RNALISA POUR - 09/07/2020 11:14 EDT   ED General Section :   Document assessment   Pregnancy Status :   N/A   ED Allergies Section :   Document assessment   ED Reason for Visit Section :   Document assessment   ED Home Meds Section :   Document assessment   KOMINE, RN, GAIL K - 09/07/2020 11:14 EDT   PTA/Triage Treatments   ED PTA Pre-Arrival Service :   [facility]   [place], [place], GAIL K - 09/07/2020 11:14 EDT   ID Risk Screen Symptoms   Recent Travel History :   No recent travel   Close Contact with COVID-19 ID :   No   Last 14 days COVID-19 ID :   No   KOMINE, RN, GAIL K - 09/07/2020 11:14 EDT   Allergies   (As Of: 09/07/2020 11:21:15 EDT)    Allergies (Active)   No Known Allergies  Estimated Onset Date:   Unspecified ; Created By:   Teresa Spates D; Reaction Status:   Active ; Category:   Drug ; Substance:   No Known Allergies ; Type:   Allergy ; Updated By:   Teresa Spates D; Source:   Paper Chart/Abstracting ; Reviewed Date:   07/12/2020 14:19 EDT        Psycho-Social   Last 3 mo, thoughts killing self/others :   Patient denies   Right click within box for Suspected Abuse policy link. :   None   Feels Safe Where Live :   Yes  KOMINE, RN, GAIL K - 09/07/2020 11:14 EDT   ED Home Med List   Medication List   (As Of: 09/07/2020 11:21:15 EDT)   Normal Order    Sodium Chloride 0.9% intravenous solution Bolus  :   Sodium Chloride 0.9% intravenous solution Bolus ; Status:   Ordered ; Ordered As Mnemonic:   Sodium Chloride 0.9% bolus ; Simple Display Line:   1,000 mL, 1000 mL/hr, IV Piggyback, Once ; Ordering Provider:   REATHA MAYO A-MD; Catalog Code:   Sodium Chloride 0.9% ; Order Dt/Tm:   09/07/2020 11:18:36 EDT            Home Meds    cyanocobalamin  :   cyanocobalamin ; Status:   Documented ; Ordered As Mnemonic:   Vitamin B-12 (cyanocobalamin) 1000 mcg/mL injectable solution ; Simple Display Line:   1,000 mcg, 1 mL, Subcutaneous, qMonth, 10 mL, 0 Refill(s) ; Catalog Code:   cyanocobalamin ; Order Dt/Tm:   09/07/2020 11:20:40 EDT          aspirin  :   aspirin ; Status:   Documented ; Ordered As Mnemonic:   aspirin ; Simple Display Line:   81 mg, Oral, Daily, PRN: fever, 0 Refill(s) ; Catalog Code:   aspirin ; Order Dt/Tm:   07/12/2020 14:23:22 EDT          ibuprofen  :   ibuprofen ; Status:   Documented ; Ordered As Mnemonic:   ibuprofen ; Simple Display Line:   600 mg, Oral, QID, PRN: mild pain (1-3), 0 Refill(s) ; Catalog Code:   ibuprofen ; Order Dt/Tm:   03/15/2017 90:71:85 EDT          levothyroxine  :   levothyroxine ; Status:   Documented ; Ordered As Mnemonic:   levothyroxine ; Simple Display Line:   200 mcg, Oral, qAM, 0 Refill(s) ; Catalog Code:    levothyroxine ; Order Dt/Tm:   02/08/2017 14:36:07 EDT          silodosin  :   silodosin ; Status:   Documented ; Ordered As Mnemonic:   Rapaflo 8 mg oral capsule ; Simple Display Line:   8 mg, 1 caps, Oral, qPM, 0 Refill(s) ; Catalog Code:   silodosin ; Order Dt/Tm:   02/08/2017 14:37:22 EDT          venlafaxine  :   venlafaxine ; Status:   Documented ; Ordered As Mnemonic:   venlafaxine 75 mg oral capsule, extended release ; Simple Display Line:   75 mg, 1 caps, Oral, qPM, 30 caps, 0 Refill(s) ; Ordering Provider:   DENNARD MERILEE CROME; Catalog Code:   venlafaxine ; Order Dt/Tm:   02/08/2017 14:37:22 EDT ; Comment:   DO NOT CRUSH            ED Reason for Visit   (As Of: 09/07/2020 11:21:15 EDT)   Problems(Active)    Aortic valve stenosis (SNOMED CT  :899366986 )  Name of Problem:   Aortic valve stenosis ; Recorder:   TURNER, RN, LYNN R; Confirmation:   Confirmed ; Classification:   Patient Stated ; Code:   899366986 ; Contributor System:   PowerChart ; Last Updated:   03/15/2017 9:47 EDT ; Life Cycle Date:   02/08/2017 ; Life Cycle Status:   Active ; Vocabulary:   SNOMED CT   ; Comments:        03/15/2017 9:47 - Velma, RN, Dickey FALCON  pt states was told moderatestenosis per  dr loni. Does not cause any symptoms at this time    03/15/2017 9:59 - Velma, RN, Dickey FALCON  pt said he had no problems with anesthesia that lasted about the same length of time as the surgery in Apr'18 and Dr loni had already cleared him for the surgery in Apr'18-notes in [employer].      BPH (benign prostatic hyperplasia) (SNOMED CT  :603268987 )  Name of Problem:   BPH (benign prostatic hyperplasia) ; Recorder:   TURNER, RN, LYNN R; Confirmation:   Confirmed ; Classification:   Patient Stated ; Code:   603268987 ; Contributor System:   PowerChart ; Last Updated:   02/08/2017 14:41 EDT ; Life Cycle Date:   02/08/2017 ; Life Cycle Status:   Active ; Vocabulary:   SNOMED CT        Dental crowns status (SNOMED CT  :[id number] )   Name of Problem:   Dental crowns status ; Recorder:   TURNER, RN, LYNN R; Confirmation:   Confirmed ; Classification:   Patient Stated ; Code:   [id number] ; Contributor System:   PowerChart ; Last Updated:   02/08/2017 14:42 EDT ; Life Cycle Date:   02/08/2017 ; Life Cycle Status:   Active ; Vocabulary:   SNOMED CT        Gastritis (SNOMED CT  :2158980 )  Name of Problem:   Gastritis ; Recorder:   KOMINE, RN, GAIL K; Confirmation:   Confirmed ; Classification:   Patient Stated ; Code:   2158980 ; Contributor System:   PowerChart ; Last Updated:   09/07/2020 11:18 EDT ; Life Cycle Date:   09/07/2020 ; Life Cycle Status:   Active ; Vocabulary:   SNOMED CT        H/O: HTN (hypertension) (SNOMED CT  :748325985 )  Name of Problem:   H/O: HTN (hypertension) ; Recorder:   TURNER, RN, LYNN R; Confirmation:   Confirmed ; Classification:   Patient Stated ; Code:   748325985 ; Contributor System:   PowerChart ; Last Updated:   02/08/2017 14:40 EDT ; Life Cycle Date:   02/08/2017 ; Life Cycle Status:   Active ; Vocabulary:   SNOMED CT        High cholesterol (SNOMED CT  :76716984 )  Name of Problem:   High cholesterol ; Recorder:   TURNER, RN, LYNN R; Confirmation:   Confirmed ; Classification:   Patient Stated ; Code:   76716984 ; Contributor System:   PowerChart ; Last Updated:   02/08/2017 14:39 EDT ; Life Cycle Date:   02/08/2017 ; Life Cycle Status:   Active ; Vocabulary:   SNOMED CT        Hypothyroid (SNOMED CT  :31731988 )  Name of Problem:   Hypothyroid ; Recorder:   TURNER, RN, LYNN R; Confirmation:   Confirmed ; Classification:   Patient Stated ; Code:   31731988 ; Contributor System:   PowerChart ; Last Updated:   02/08/2017 14:39 EDT ; Life Cycle Date:   02/08/2017 ; Life Cycle Status:   Active ; Vocabulary:   SNOMED CT        Syncope (SNOMED CT  :593559989 )  Name of Problem:   Syncope ; Recorder:   KOMINE, RN, GAIL K; Confirmation:   Confirmed ; Classification:   Patient Stated ; Code:   593559989 ;  Contributor System:   PowerChart ; Last Updated:   09/07/2020 11:18 EDT ; Life Cycle Date:   09/07/2020 ; Life  Cycle Status:   Active ; Vocabulary:   SNOMED CT        Tongue cancer (SNOMED CT  :517452989 )  Name of Problem:   Tongue cancer ; Onset Date:   02/04/2017 ; Recorder:   Velma RN, Dickey FALCON; Confirmation:   Confirmed ; Classification:   Patient Stated ; Code:   517452989 ; Contributor System:   PowerChart ; Last Updated:   03/15/2017 9:46 EDT ; Life Cycle Date:   03/15/2017 ; Life Cycle Status:   Active ; Vocabulary:   SNOMED CT   ; Comments:        03/15/2017 9:46 - Velma, RN, Dickey FALCON  had first surgery in Apr'18-now for Left Cervical Lymphadenectomy on 03/20/17      Vasovagal episode (SNOMED CT  :8221810986 )  Name of Problem:   Vasovagal episode ; Recorder:   KEE, RN, GAIL K; Confirmation:   Confirmed ; Classification:   Patient Stated ; Code:   8221810986 ; Contributor System:   PowerChart ; Last Updated:   09/07/2020 11:19 EDT ; Life Cycle Date:   09/07/2020 ; Life Cycle Status:   Active ; Vocabulary:   SNOMED CT          Diagnoses(Active)    Syncope/Near syncope  Date:   09/07/2020 ; Diagnosis Type:   Reason For Visit ; Confirmation:   Complaint of ; Clinical Dx:   Syncope/Near syncope ; Classification:   Medical ; Clinical Service:   Emergency medicine ; Code:   PNED ; Probability:   0 ; Diagnosis Code:   [id number]

## 2020-09-07 NOTE — ED Notes (Signed)
 ED Triage Note       ED Secondary Triage Entered On:  09/07/2020 11:22 EDT    Performed On:  09/07/2020 11:21 EDT by KOMINE, RN, GAIL K               General Information   Barriers to Learning :   None evident   COVID-19 Vaccine Status :   2 Doses received   2 Doses Received Manufacturer :   Pfizer vaccine   ED Home Meds Section :   Document assessment   [facility] ED Fall Risk Section :   Document assessment   ED Advance Directives Section :   Document assessment   ED Palliative Screen :   Document assessment   KEE RN, GAIL K - 09/07/2020 11:21 EDT   (As Of: 09/07/2020 11:22:02 EDT)   Problems(Active)    Aortic valve stenosis (SNOMED CT  :899366986 )  Name of Problem:   Aortic valve stenosis ; Recorder:   TURNER, RN, LYNN R; Confirmation:   Confirmed ; Classification:   Patient Stated ; Code:   899366986 ; Contributor System:   PowerChart ; Last Updated:   03/15/2017 9:47 EDT ; Life Cycle Date:   02/08/2017 ; Life Cycle Status:   Active ; Vocabulary:   SNOMED CT   ; Comments:        03/15/2017 9:47 - Velma, RN, Dickey FALCON  pt states was told moderatestenosis per dr loni. Does not cause any symptoms at this time    03/15/2017 9:59 - Velma, RN, Dickey FALCON  pt said he had no problems with anesthesia that lasted about the same length of time as the surgery in Apr'18 and Dr loni had already cleared him for the surgery in Apr'18-notes in [employer].      BPH (benign prostatic hyperplasia) (SNOMED CT  :603268987 )  Name of Problem:   BPH (benign prostatic hyperplasia) ; Recorder:   TURNER, RN, LYNN R; Confirmation:   Confirmed ; Classification:   Patient Stated ; Code:   603268987 ; Contributor System:   PowerChart ; Last Updated:   02/08/2017 14:41 EDT ; Life Cycle Date:   02/08/2017 ; Life Cycle Status:   Active ; Vocabulary:   SNOMED CT        Dental crowns status (SNOMED CT  :[id number] )  Name of Problem:   Dental crowns status ; Recorder:   TURNER, RN, LYNN R; Confirmation:   Confirmed ;  Classification:   Patient Stated ; Code:   [id number] ; Contributor System:   PowerChart ; Last Updated:   02/08/2017 14:42 EDT ; Life Cycle Date:   02/08/2017 ; Life Cycle Status:   Active ; Vocabulary:   SNOMED CT        Gastritis (SNOMED CT  :2158980 )  Name of Problem:   Gastritis ; Recorder:   KOMINE, RN, GAIL K; Confirmation:   Confirmed ; Classification:   Patient Stated ; Code:   2158980 ; Contributor System:   PowerChart ; Last Updated:   09/07/2020 11:18 EDT ; Life Cycle Date:   09/07/2020 ; Life Cycle Status:   Active ; Vocabulary:   SNOMED CT        H/O: HTN (hypertension) (SNOMED CT  :748325985 )  Name of Problem:   H/O: HTN (hypertension) ; Recorder:   TURNER, RN, LYNN R; Confirmation:   Confirmed ; Classification:   Patient Stated ; Code:   748325985 ; Contributor System:   PowerChart ; Last Updated:  02/08/2017 14:40 EDT ; Life Cycle Date:   02/08/2017 ; Life Cycle Status:   Active ; Vocabulary:   SNOMED CT        High cholesterol (SNOMED CT  :76716984 )  Name of Problem:   High cholesterol ; Recorder:   TURNER, RN, LYNN R; Confirmation:   Confirmed ; Classification:   Patient Stated ; Code:   76716984 ; Contributor System:   PowerChart ; Last Updated:   02/08/2017 14:39 EDT ; Life Cycle Date:   02/08/2017 ; Life Cycle Status:   Active ; Vocabulary:   SNOMED CT        Hypothyroid (SNOMED CT  :31731988 )  Name of Problem:   Hypothyroid ; Recorder:   TURNER, RN, LYNN R; Confirmation:   Confirmed ; Classification:   Patient Stated ; Code:   31731988 ; Contributor System:   PowerChart ; Last Updated:   02/08/2017 14:39 EDT ; Life Cycle Date:   02/08/2017 ; Life Cycle Status:   Active ; Vocabulary:   SNOMED CT        Syncope (SNOMED CT  :593559989 )  Name of Problem:   Syncope ; Recorder:   KOMINE, RN, GAIL K; Confirmation:   Confirmed ; Classification:   Patient Stated ; Code:   593559989 ; Contributor System:   PowerChart ; Last Updated:   09/07/2020 11:18 EDT ; Life Cycle Date:   09/07/2020 ;  Life Cycle Status:   Active ; Vocabulary:   SNOMED CT        Tongue cancer (SNOMED CT  :517452989 )  Name of Problem:   Tongue cancer ; Onset Date:   02/04/2017 ; Recorder:   Velma RN, Dickey FALCON; Confirmation:   Confirmed ; Classification:   Patient Stated ; Code:   517452989 ; Contributor System:   PowerChart ; Last Updated:   03/15/2017 9:46 EDT ; Life Cycle Date:   03/15/2017 ; Life Cycle Status:   Active ; Vocabulary:   SNOMED CT   ; Comments:        03/15/2017 9:46 - Velma, RN, Dickey FALCON  had first surgery in Apr'18-now for Left Cervical Lymphadenectomy on 03/20/17      Vasovagal episode (SNOMED CT  :8221810986 )  Name of Problem:   Vasovagal episode ; Recorder:   KEE, RN, GAIL K; Confirmation:   Confirmed ; Classification:   Patient Stated ; Code:   [id number] ; Contributor System:   PowerChart ; Last Updated:   09/07/2020 11:19 EDT ; Life Cycle Date:   09/07/2020 ; Life Cycle Status:   Active ; Vocabulary:   SNOMED CT          Diagnoses(Active)    Syncope/Near syncope  Date:   09/07/2020 ; Diagnosis Type:   Reason For Visit ; Confirmation:   Complaint of ; Clinical Dx:   Syncope/Near syncope ; Classification:   Medical ; Clinical Service:   Emergency medicine ; Code:   PNED ; Probability:   0 ; Diagnosis Code:   [id number]             -    Procedure History   (As Of: 09/07/2020 11:22:02 EDT)     Procedure Dt/Tm:   02/13/2017 12:57:00 EDT ; Location:   [facility] ; Provider:   DONALYNN LOVING; Anesthesia Type:   General ; Anesthesia Minutes:   0 ; Procedure Name:   Glossectomy (Partial) ; Procedure Minutes:   83 ; Comments:     02/13/2017 14:32 EDT -  Hayward, RN, Aloha  auto-populated from documented surgical case ; Clinical Service:   Surgery            Procedure Dt/Tm:   02/13/2017 12:57:00 EDT ; Location:   [facility] ; Provider:   DONALYNN LOVING; Anesthesia Type:   General ; Anesthesia Minutes:   0 ; Procedure Name:   Skin Graft Split Thickness ; Procedure Minutes:   83 ; Comments:      02/13/2017 14:32 EDT - Hayward, RN, Aloha  auto-populated from documented surgical case ; Clinical Service:   Surgery            Anesthesia Minutes:   0 ; Procedure Name:   Wisdom tooth ; Procedure Minutes:   0            Anesthesia Minutes:   0 ; Procedure Name:   Colonoscopy ; Procedure Minutes:   0            Procedure Dt/Tm:   03/20/2017 13:08:00 EDT ; Location:   [facility] ; Provider:   DONALYNN LOVING; Anesthesia Type:   General ; :   SLATTERY-MD,  STEPHEN O; Anesthesia Minutes:   0 ; Procedure Name:   Neck Dissection (Left, Cervical) ; Procedure Minutes:   136 ; Comments:     03/20/2017 15:37 EDT - Marty, RN, Lucie CROME  auto-populated from documented surgical case ; Clinical Service:   Surgery            Anesthesia Minutes:   0 ; Procedure Name:   Dupuytren contracture of right palm ; Procedure Minutes:   0            [facility] Fall Risk Assessment Tool   Hx of falling last 3 months ED Fall :   No   Patient confused or disoriented ED Fall :   No   Patient intoxicated or sedated ED Fall :   No   Patient impaired gait ED Fall :   No   Use a mobility assistance device ED Fall :   No   Patient altered elimination ED Fall :   No   [facility] ED Fall Score :   0    KEE RN, GAIL K - 09/07/2020 11:21 EDT   ED Advance Directive   Advance Directive :   No   KOMINE, RN, GAIL K - 09/07/2020 11:21 EDT   Palliative Care   Does the Patient have a Life Limiting Illness :   None of the above   KEE, RN, GAIL K - 09/07/2020 11:21 EDT

## 2020-09-07 NOTE — Nursing Note (Signed)
Orthostatics - Text       Orthostatics Entered On:  09/07/2020 12:12 EDT    Performed On:  09/07/2020 12:11 EDT by Zandra Abts P               Orthostatics   Systolic/Diastolic  Supine BP :   127 mmHg   Systolic/Diastolic  Supine BP :   76 mmHg   Pulse Supine :   61 bpm   Systolic/Diastolic  Standing BP :   125 mmHg   Systolic/Diastolic  Standing BP :   79 mmHg   Pulse Standing :   73 bpm   Systolic/Diastolic  Sitting BP :   127 mmHg   Systolic/Diastolic  Sitting BP :   82 mmHg   Pulse Sitting :   63 bpm   Constance Goltz - 09/07/2020 12:11 EDT

## 2020-09-07 NOTE — ED Provider Notes (Signed)
Syncope *ED        Patient:   Ruben [name], Ruben [name]            MRN: [id number]            FIN: [id number]               Age:   69 years     Sex:  Male     DOB:  1951/10/12   Associated Diagnoses:   Syncope; S/P TAVR (transcatheter aortic valve replacement)   Author:   Benay Pillow A-MD      Basic Information   Time seen: Provider Seen (ST)   ED Provider/Time:    Benay Pillow A-MD / 09/07/2020 11:17  .   Additional information: Chief Complaint from Nursing Triage Note   Chief Complaint  Chief Complaint: Patient states he had a syncopal episode and fell off a chair.  Denies injury.  Seen on Labor Day for syncopal episode and has had 4 episodes since then.  States he feels pain across chest prior (09/07/20 11:14:00).      History of Present Illness   Home for syncopal event.  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  He lowered himself to the ground and felt poorly after that while laying on the ground.  He has an associated mild chest tightness without any significant shortness of breath.  No abdominal pain, nausea, or vomiting.  He has a history of TAVR performed at [facility] in September.  He had a preoperative heart cath he states had no blockages.  The patient has had 4 syncopal events since the TAVR placement.  He saw his cardiologist Dr. Oletta Lamas after the first 1 and at that point it was felt related to dehydration given some outdoor exposure.  His subsequent episodes have occurred while he is at rest unlike the first spell.  He currently feels better but states that he always feels very fatigued and has a hard time "referring" from the episodes.  Severity is times mild.  He was given 300 cc of IV fluid per EMS to try to alleviate his symptoms.  Bigeminy noted in transport.        Review of Systems             Additional review of systems information: All other systems reviewed and otherwise negative.      Health Status   Allergies:     Allergic Reactions (All)  No Known Allergies.   Medications:  (Selected)   Documented Medications  Documented  Rapaflo 8 mg oral capsule: 8 mg, 1 caps, Oral, qPM, 0 Refill(s)  Vitamin B-12 (cyanocobalamin) 1000 mcg/mL injectable solution: 1,000 mcg, 1 mL, Subcutaneous, qMonth, 10 mL, 0 Refill(s)  aspirin: 81 mg, Oral, Daily, PRN: fever, 0 Refill(s)  ibuprofen: 600 mg, Oral, QID, PRN: mild pain (1-3), 0 Refill(s)  levothyroxine: 200 mcg, Oral, qAM, 0 Refill(s)  venlafaxine 75 mg oral capsule, extended release: 75 mg, 1 caps, Oral, qPM, 30 caps, 0 Refill(s).      Past Medical/ Family/ Social History   Medical history: Reviewed as documented in chart.   Surgical history: Reviewed as documented in chart.   Family history: Not significant.   Social history: Reviewed as documented in chart.   Problem list:    Active Problems (10)  Aortic valve stenosis   BPH (benign prostatic hyperplasia)   Dental crowns status  Gastritis   H/O: HTN (hypertension)   High cholesterol   Hypothyroid   Syncope   Tongue cancer   Vasovagal episode   .      Physical Examination               Vital Signs   Vital Signs   15/05/2619 35:59 EDT Systolic Blood Pressure 741 mmHg    Diastolic Blood Pressure 82 mmHg    Temperature Oral 36.6 degC    Heart Rate Monitored 69 bpm    Respiratory Rate 16 br/min    SpO2 98 %   .   Measurements   09/07/2020 11:21 EDT Body Mass Index est meas 28.05 kg/m2   09/07/2020 11:21 EDT Body Mass Index Measured 28.05 kg/m2   09/07/2020 11:14 EDT Height/Length Measured 182 cm    Weight Dosing 92.9 kg   .   Basic Oxygen Information   09/07/2020 11:14 EDT Oxygen Therapy Room air    SpO2 98 %   .   General:  Alert, no acute distress.    Skin:  Warm, dry, pink.    Head:  Normocephalic, atraumatic.    Neck:  No JVD.   Eye:  Extraocular movements are intact, normal conjunctiva.    Cardiovascular:  Regular rate and rhythm, Normal peripheral perfusion, 2/6 systolic ejection murmur right upper sternal border.    Respiratory:  Lungs  are clear to auscultation, respirations are non-labored, breath sounds are equal.    Gastrointestinal:  Soft, Nontender, Non distended, Normal bowel sounds.    Musculoskeletal:  No swelling.   Neurological:  No focal neurological deficit observed.   Psychiatric:  Cooperative, appropriate mood & affect.       Medical Decision Making   Documents reviewed:  Emergency department nurses' notes, flowsheet, emergency medical system run report, vital signs.    Electrocardiogram:  Emergency Provider interpretation performed by me, See ECG ED Review, new lateral TWI.    Results review:  Lab results : Lab View   09/07/2020 13:24 EDT Appear U POC Clear    Color U POC Yellow    Bili U POC Negative    Blood U POC Negative    Glucose U POC Negative mg/dL    Ketones U POC Negative mg/dL    Leuk Est U POC Negative    Nitrite U POC Negative    pH U POC 7.0    Protein U POC Negative    Spec Grav U POC 1.020    Urobilin U POC 0.2 EU/dL   09/07/2020 12:15 EDT SARS-CoV-2 Only ([place]) Not Detected   09/07/2020 11:59 EDT Estimated Creatinine Clearance 104.05 mL/min   09/07/2020 11:22 EDT WBC 5.1 x10e3/mcL    RBC 4.34 x10e6/mcL    Hgb 14.1 g/dL    HCT 42.5 %    MCV 97.9 fL    MCH 32.5 pg    MCHC 33.2 g/dL    RDW 11.9 %    Platelet 218 x10e3/mcL    MPV 9.4 fL    Neutro Auto 70.9 %    Neutro Absolute 3.6 x10e3/mcL    Immature Grans Percent 0.2 %    Immature Grans Absolute 0.01 x10e3/mcL    Lymph Auto 17.6 %    Lymph Absolute 0.9 x10e3/mcL  LOW    Mono Auto 8.1 %    Mono Absolute 0.4 x10e3/mcL    Eosinophil Percent 2.6 %    Eos Absolute 0.1 x10e3/mcL    Basophil Auto 0.6 %    Baso Absolute  0.0 x10e3/mcL    Sodium Lvl 139 mmol/L    Potassium Lvl 3.9 mmol/L    Chloride 101 mmol/L    CO2 26 mmol/L    Glucose Random 120 mg/dL  HI    BUN 21 mg/dL    Creatinine Lvl 0.8 mg/dL    AGAP 12 mmol/L    Osmolality Calc 281 mOsm/kg    Calcium Lvl 9.0 mg/dL    eGFR AA 106 mL/min/1.41m???    eGFR Non-AA 92 mL/min/1.770m??    Trop T Quant 0.020 ng/mL  HI   09/07/2020  11:21 EDT Estimated Creatinine Clearance 92.49 mL/min   .      Impression and Plan   Diagnosis   Syncope (ICD10-CM R55, Discharge, Medical)   S/P TAVR (transcatheter aortic valve replacement) (ICD10-CM Z95.2, Discharge, Medical)      Calls-Consults   -  WOODFIELD,  SCOTT-MD, recommends Transport [facility] for further cardiac evaluation, possible pacemaker if any evidence of heart block.    Plan   Condition: Stable.    Notes: Patient presents after multiple syncopal episodes since TAVR 2 months ago.  He has mild upon elevation but reportedly had a negative heart catheterization prior to the TAVR placement a few months ago.  He otherwise has not had any profound bradycardia or heart block noted here.  That would be the main concern in the setting of recent TAVR.  Cardiology consult and he will be admitted for further cardiac monitoring and intervention as needed.    Signature Line     Electronically Signed on 09/07/2020 02:06 PM EDT   ________________________________________________   CUBenay Pillow-MD

## 2020-09-07 NOTE — Nursing Note (Signed)
 Adult Patient History Form-Text       Adult Patient History Entered On:  09/07/2020 15:55 EDT    Performed On:  09/07/2020 15:50 EDT by Brinda Aquas R-RN               General Info   Patient Identified :   Identification band, Verbal   Patient Identified :   Ruben [name]   Information Given By :   Self   Preferred Mode of Communication :   Verbal, Written   Accompanied By :   None   In Charge of News (ICON) Name :   wife Ruben [name]  [phone]   Pregnancy Status :   N/A   Has the patient received chemotherapy or immunotherapy (cytotoxic)  in the last 48-72 hours? :   No   In Clinical Trial With Signed Consent for Related Condition :   N/A   Is the patient currently (2-3 days) receiving radiation treatment? :   No   Brinda Aquas R-RN - 09/07/2020 15:50 EDT   Allergies   (As Of: 09/07/2020 15:55:43 EDT)   Allergies (Active)   No Known Allergies  Estimated Onset Date:   Unspecified ; Created By:   Teresa Spates D; Reaction Status:   Active ; Category:   Drug ; Substance:   No Known Allergies ; Type:   Allergy ; Updated By:   Teresa Spates D; Source:   Paper Chart/Abstracting ; Reviewed Date:   09/07/2020 15:51 EDT        Problem History   (As Of: 09/07/2020 15:55:43 EDT)   Problems(Active)    Aortic valve stenosis (SNOMED CT  :899366986 )  Name of Problem:   Aortic valve stenosis ; Recorder:   TURNER, RN, LYNN R; Confirmation:   Confirmed ; Classification:   Patient Stated ; Code:   899366986 ; Contributor System:   PowerChart ; Last Updated:   03/15/2017 9:47 EDT ; Life Cycle Date:   02/08/2017 ; Life Cycle Status:   Active ; Vocabulary:   SNOMED CT   ; Comments:        03/15/2017 9:47 - Velma, RN, Dickey FALCON  pt states was told moderatestenosis per dr loni. Does not cause any symptoms at this time    03/15/2017 9:59 - Velma, RN, Dickey FALCON  pt said he had no problems with anesthesia that lasted about the same length of time as the surgery in Apr'18 and Dr loni had already cleared him for the surgery in Apr'18-notes in [facility].       BPH (benign prostatic hyperplasia) (SNOMED CT  :603268987 )  Name of Problem:   BPH (benign prostatic hyperplasia) ; Recorder:   TURNER, RN, LYNN R; Confirmation:   Confirmed ; Classification:   Patient Stated ; Code:   603268987 ; Contributor System:   PowerChart ; Last Updated:   02/08/2017 14:41 EDT ; Life Cycle Date:   02/08/2017 ; Life Cycle Status:   Active ; Vocabulary:   SNOMED CT        Dental crowns status (SNOMED CT  :[id number]-[id number] )  Name of Problem:   Dental crowns status ; Recorder:   TURNER, RN, LYNN R; Confirmation:   Confirmed ; Classification:   Patient Stated ; Code:   [id number] ; Contributor System:   PowerChart ; Last Updated:   02/08/2017 14:42 EDT ; Life Cycle Date:   02/08/2017 ; Life Cycle Status:   Active ; Vocabulary:   SNOMED CT  Gastritis (SNOMED CT  :2158980 )  Name of Problem:   Gastritis ; Recorder:   KOMINE, RN, GAIL K; Confirmation:   Confirmed ; Classification:   Patient Stated ; Code:   2158980 ; Contributor System:   PowerChart ; Last Updated:   09/07/2020 11:18 EDT ; Life Cycle Date:   09/07/2020 ; Life Cycle Status:   Active ; Vocabulary:   SNOMED CT        H/O: HTN (hypertension) (SNOMED CT  :748325985 )  Name of Problem:   H/O: HTN (hypertension) ; Recorder:   TURNER, RN, LYNN R; Confirmation:   Confirmed ; Classification:   Patient Stated ; Code:   748325985 ; Contributor System:   PowerChart ; Last Updated:   02/08/2017 14:40 EDT ; Life Cycle Date:   02/08/2017 ; Life Cycle Status:   Active ; Vocabulary:   SNOMED CT        High cholesterol (SNOMED CT  :76716984 )  Name of Problem:   High cholesterol ; Recorder:   TURNER, RN, LYNN R; Confirmation:   Confirmed ; Classification:   Patient Stated ; Code:   76716984 ; Contributor System:   PowerChart ; Last Updated:   02/08/2017 14:39 EDT ; Life Cycle Date:   02/08/2017 ; Life Cycle Status:   Active ; Vocabulary:   SNOMED CT        Hypothyroid (SNOMED CT  :31731988 )  Name of Problem:    Hypothyroid ; Recorder:   TURNER, RN, LYNN R; Confirmation:   Confirmed ; Classification:   Patient Stated ; Code:   31731988 ; Contributor System:   PowerChart ; Last Updated:   02/08/2017 14:39 EDT ; Life Cycle Date:   02/08/2017 ; Life Cycle Status:   Active ; Vocabulary:   SNOMED CT        Syncope (SNOMED CT  :593559989 )  Name of Problem:   Syncope ; Recorder:   KOMINE, RN, GAIL K; Confirmation:   Confirmed ; Classification:   Patient Stated ; Code:   593559989 ; Contributor System:   PowerChart ; Last Updated:   09/07/2020 11:18 EDT ; Life Cycle Date:   09/07/2020 ; Life Cycle Status:   Active ; Vocabulary:   SNOMED CT        Tongue cancer (SNOMED CT  :517452989 )  Name of Problem:   Tongue cancer ; Onset Date:   02/04/2017 ; Recorder:   Velma RN, Dickey FALCON; Confirmation:   Confirmed ; Classification:   Patient Stated ; Code:   517452989 ; Contributor System:   PowerChart ; Last Updated:   03/15/2017 9:46 EDT ; Life Cycle Date:   03/15/2017 ; Life Cycle Status:   Active ; Vocabulary:   SNOMED CT   ; Comments:        03/15/2017 9:46 - Velma, RN, Dickey FALCON  had first surgery in Apr'18-now for Left Cervical Lymphadenectomy on 03/20/17      Vasovagal episode (SNOMED CT  :8221810986 )  Name of Problem:   Vasovagal episode ; Recorder:   KEE, RN, GAIL K; Confirmation:   Confirmed ; Classification:   Patient Stated ; Code:   8221810986 ; Contributor System:   PowerChart ; Last Updated:   09/07/2020 11:19 EDT ; Life Cycle Date:   09/07/2020 ; Life Cycle Status:   Active ; Vocabulary:   SNOMED CT          Diagnoses(Active)    Aortic stenosis  Date:   09/07/2020 ; Diagnosis Type:  Discharge ; Confirmation:   Confirmed ; Clinical Dx:   Aortic stenosis ; Classification:   Medical ; Clinical Service:   Non-Specified ; Code:   ICD-10-CM ; Probability:   0 ; Diagnosis Code:   I35.0      Syncope  Date:   09/07/2020 ; Diagnosis Type:   Discharge ; Confirmation:   Confirmed ; Clinical Dx:   Syncope ; Classification:   Medical ; Clinical  Service:   Non-Specified ; Code:   ICD-10-CM ; Probability:   0 ; Diagnosis Code:   R55        Procedure History        -    Procedure History   (As Of: 09/07/2020 15:55:43 EDT)     Procedure Dt/Tm:   02/13/2017 12:57:00 EDT ; Location:   [facility] ; Provider:   DONALYNN LOVING; Anesthesia Type:   General ; Anesthesia Minutes:   0 ; Procedure Name:   Glossectomy (Partial) ; Procedure Minutes:   68 ; Comments:     02/13/2017 14:32 EDT - Hayward, RN, Aloha  auto-populated from documented surgical case ; Clinical Service:   Surgery ; Last Reviewed Dt/Tm:   09/07/2020 15:52:14 EDT            Procedure Dt/Tm:   02/13/2017 12:57:00 EDT ; Location:   [facility] ; Provider:   DONALYNN LOVING; Anesthesia Type:   General ; Anesthesia Minutes:   0 ; Procedure Name:   Skin Graft Split Thickness ; Procedure Minutes:   83 ; Comments:     02/13/2017 14:32 EDT - Hayward, RN, Aloha  auto-populated from documented surgical case ; Clinical Service:   Surgery ; Last Reviewed Dt/Tm:   09/07/2020 15:52:14 EDT            Anesthesia Minutes:   0 ; Procedure Name:   Wisdom tooth ; Procedure Minutes:   0 ; Last Reviewed Dt/Tm:   09/07/2020 15:52:14 EDT            Anesthesia Minutes:   0 ; Procedure Name:   Colonoscopy ; Procedure Minutes:   0 ; Last Reviewed Dt/Tm:   09/07/2020 15:52:14 EDT            Procedure Dt/Tm:   03/20/2017 13:08:00 EDT ; Location:   [facility] ; Provider:   DONALYNN LOVING; Anesthesia Type:   General ; :   SLATTERY-MD,  STEPHEN O; Anesthesia Minutes:   0 ; Procedure Name:   Neck Dissection (Left, Cervical) ; Procedure Minutes:   136 ; Comments:     03/20/2017 15:37 EDT - Marty, RN, Lucie CROME  auto-populated from documented surgical case ; Clinical Service:   Surgery ; Last Reviewed Dt/Tm:   09/07/2020 15:52:14 EDT            Anesthesia Minutes:   0 ; Procedure Name:   Dupuytren contracture of right palm ; Procedure Minutes:   0 ; Last Reviewed Dt/Tm:   09/07/2020 15:52:14 EDT            ID Risk Screen Symptoms   Recent Travel  History :   No recent travel   Close Contact with COVID-19 ID :   No   Last 14 days COVID-19 ID :   Yes - Not Detected (negative)   TB Symptom Screen :   Unable to obtain   C. diff Symptom/History ID :   Neither of the above   Patient Pregnant :   None of the  above   MRSA/VRE Screening :   None of these apply   CRE Screening :   Not applicable   Brinda Aquas R-RN - 09/07/2020 15:50 EDT   Bloodless Medicine   Is Blood Transfusion Acceptable to Patient :   Yes   Brinda Aquas R-RN - 09/07/2020 15:50 EDT   Nutrition   MST Does Your Current Diet Include :   None   Nutrition Screen for Malnutrition :   Patient denies   Brinda Aquas R-RN - 09/07/2020 15:50 EDT   Functional   Sensory Deficits :   None   ADLs Prior to Admission :   Independent   Brinda Aquas R-RN - 09/07/2020 15:50 EDT   Social History   Social History   (As Of: 09/07/2020 15:55:43 EDT)   Tobacco:        Never smoker   (Last Updated: 02/13/2017 11:17:13 EDT by Jearld RN, Olam SAUNDERS)          Electronic Cigarette/Vaping:        Never Electronic Cigarette Use.   (Last Updated: 09/07/2020 15:53:37 EDT by Brinda Aquas R-RN)          Alcohol:        Past   Comments:  02/13/2017 11:17 - Jearld, RN, Olam SAUNDERS: NONE IN 10 YEARS   (Last Updated: 02/13/2017 11:17:49 EDT by Jearld, RN, Olam SAUNDERS)          Substance Use:        Denies   (Last Updated: 02/08/2017 14:44:20 EDT by SHLOMO, RN, LYNN R)            Spiritual   Faith/Denomination :   Sherlean   Do you have any religious/spiritual/cultural beliefs that could impact the way your care is provided? :   No   Brinda Aquas R-RN - 09/07/2020 15:50 EDT   Harm Screen   Suspect or Concern for: :   None   Feels Safe Where Live :   Yes   Agency(s)/Others notified :   No   Last 3 mo, thoughts killing self/others :   Patient denies   Cognitively Impaired :   No   Ambulatory or Self Mobile in [facility] :   Yes   Brinda Aquas R-RN - 09/07/2020 15:50 EDT   Advance Directive   Advance Directive :   Yes   Type of Advance Directive :   Living will, Medical  durable power of attorney   Patient Wishes to Receive Further Information on Advance Directives :   No   Brinda Aquas R-RN - 09/07/2020 15:50 EDT   Education   Written Language :   Isadora   Caregiver/Advocate Primary Language :   Isadora   Caregiver/Advocate Written Language :   Isadora   Primary Language :   Isadora Brinda Aquas R-RN - 09/07/2020 15:50 EDT   Caregiver/Advocate Language   Patient :   Programme researcher, broadcasting/film/video, Verbal explanation   Family :   Programme researcher, broadcasting/film/video, Verbal explanation   Brinda Aquas R-RN - 09/07/2020 15:50 EDT   Barriers to Learning :   Acuity of Illness   Teaching Method :   Teach-back   Responsible Learner Present for Session :   No   Brinda Aquas R-RN - 09/07/2020 15:50 EDT   Preventative Measures Information   Unit/Room Orientation :   [place] understanding   Environmental Safety :   Verbalizes understanding   Hand Washing :  Verbalizes understanding   Infection Prevention :   Verbalizes understanding   DVT Prophylaxis :   Verbalizes understanding   Brinda Aquas R-RN - 09/07/2020 15:50 EDT   DC Needs   CM Living Situation :   Home with no services, Family support   Anticipated Discharge Needs :   None   Brinda Aquas R-RN - 09/07/2020 15:50 EDT   Valuables and Belongings   Does Patient Have Valuables and Belongings :   Yes   Brinda Aquas R-RN - 09/07/2020 15:50 EDT   Valuables and Belongings   At Bedside :   Clothes, Glasses, Cell phone   Brinda Aquas R-RN - 09/07/2020 15:50 EDT   Patient Search Completed :   CATHERIN Brinda Aquas R-RN - 09/07/2020 15:50 EDT   Admission Complete   Admission Complete :   Yes   Brinda Aquas R-RN - 09/07/2020 15:50 EDT

## 2020-09-07 NOTE — Discharge Summary (Signed)
 ED Clinical Summary                     [facility]  [address] [place], [place], [address]  [phone]          PERSON INFORMATION  Name: Ruben [name], Ruben [name] Age:  69 Years DOB: 10-17-1951   Sex: Male Language: English PCP: ALAINE GLATTER L-DO   Marital Status: Married Phone: [phone] Med Service: [facility]   MRN: [id number] Acct# [id number] Arrival: 09/07/2020 11:12:00   Visit Reason: Syncope/Near syncope; SYNCOPE Acuity: 3 LOS: 000 03:31   Address:    [address] [place] [address]   Diagnosis:    S/P TAVR (transcatheter aortic valve replacement); Syncope  Medications:          Medications that have not changed  Other Medications  aspirin 81 Milligram Oral (given by mouth) every day as needed fever.  Last Dose:____________________  cyanocobalamin (Vitamin B-12 (cyanocobalamin) 1000 mcg/mL injectable solution) 1 Milliliter Subcutaneous (under the skin) once a month.  Last Dose:____________________  ibuprofen 600 Milligram Oral (given by mouth) 4 times a day as needed mild pain (1-3).  Last Dose:____________________  levothyroxine 200 Microgram Oral (given by mouth) once a day (in the morning).  Last Dose:____________________  silodosin (Rapaflo 8 mg oral capsule) 1 Capsules Oral (given by mouth) once a day (in the evening).  Last Dose:____________________  venlafaxine (venlafaxine 75 mg oral capsule, extended release) 1 Capsules Oral (given by mouth) once a day (in the evening)., DO NOT CRUSH  Last Dose:____________________      Medications Administered During Visit:                Medication Dose Route   Sodium Chloride 0.9% 1000 mL IV Piggyback   aspirin 324 mg Chewed               Allergies      No Known Allergies      Major Tests and Procedures:  The following procedures and tests were performed during your ED visit.  COMMON PROCEDURES%>  COMMON PROCEDURES COMMENTS%>                PROVIDER INFORMATION               Provider Role Assigned Sampson REATHA MAYO A-MD ED Provider 09/07/2020 11:17:45    Odessia Mano ED Nurse 09/07/2020 11:28:24        Attending Physician:  REATHA MAYO A-MD      Admit Doc  REATHA MAYO A-MD     Consulting Doc       VITALS INFORMATION  Vital Sign Triage Latest   Temp Oral ORAL_1%> ORAL%>   Temp Temporal TEMPORAL_1%> TEMPORAL%>   Temp Intravascular INTRAVASCULAR_1%> INTRAVASCULAR%>   Temp Axillary AXILLARY_1%> AXILLARY%>   Temp Rectal RECTAL_1%> RECTAL%>   02 Sat 98 % 97 %   Respiratory Rate RATE_1%> RATE%>   Peripheral Pulse Rate PULSE RATE_1%> PULSE RATE%>   Apical Heart Rate HEART RATE_1%> HEART RATE%>   Blood Pressure BLOOD PRESSURE_1%>/ BLOOD PRESSURE_1%>82 mmHg BLOOD PRESSURE%> / BLOOD PRESSURE%>82 mmHg                 Immunizations      No Immunizations Documented This Visit          DISCHARGE INFORMATION   Discharge Disposition: O Outpt-Another Facility   Discharge Location:  [facility]  Discharge Date and Time:  09/07/2020 14:43:27   ED Checkout Date and Time:  09/07/2020 14:43:27     DEPART REASON INCOMPLETE INFORMATION               Depart Action Incomplete Reason   Interactive View/I&O Recently assessed   Patient Education MD Decision to leave incomplete   Follow-up MD Decision to leave incomplete   Patient Understanding Recently assessed               Problems      No Problems Documented              Smoking Status      Never smoker         PATIENT EDUCATION INFORMATION  Instructions:          Follow up:            ED PROVIDER DOCUMENTATION     Patient:   Ruben [name], Ruben [name]            MRN: [id number]            FIN: [id number]               Age:   69 years     Sex:  Male     DOB:  1951/06/06   Associated Diagnoses:   Syncope; S/P TAVR (transcatheter aortic valve replacement)   Author:   REATHA MAYO A-MD      Basic Information   Time seen: Provider Seen (ST)   ED Provider/Time:    REATHA MAYO A-MD / 09/07/2020 11:17  .   Additional information: Chief Complaint from Nursing Triage Note   Chief Complaint  Chief  Complaint: Patient states he had a syncopal episode and fell off a chair.  Denies injury.  Seen on Labor Day for syncopal episode and has had 4 episodes since then.  States he feels pain across chest prior (09/07/20 11:14:00).      History of Present Illness   Home for syncopal event.  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  He lowered himself to the ground and felt poorly after that while laying on the ground.  He has an associated mild chest tightness without any significant shortness of breath.  No abdominal pain, nausea, or vomiting.  He has a history of TAVR performed at [facility] in September.  He had a preoperative heart cath he states had no blockages.  The patient has had 4 syncopal events since the TAVR placement.  He saw his cardiologist Dr. Celestia after the first 1 and at that point it was felt related to dehydration given some outdoor exposure.  His subsequent episodes have occurred while he is at rest unlike the first spell.  He currently feels better but states that he always feels very fatigued and has a hard time referring from the episodes.  Severity is times mild.  He was given 300 cc of IV fluid per EMS to try to alleviate his symptoms.  Bigeminy noted in transport.        Review of Systems             Additional review of systems information: All other systems reviewed and otherwise negative.      Health Status   Allergies:    Allergic Reactions (All)  No Known Allergies.   Medications:  (Selected)   Documented Medications  Documented  Rapaflo 8 mg oral capsule: 8 mg, 1 caps, Oral, qPM, 0 Refill(s)  Vitamin B-12 (cyanocobalamin) 1000 mcg/mL injectable solution: 1,000 mcg, 1 mL, Subcutaneous, qMonth, 10 mL, 0 Refill(s)  aspirin: 81 mg, Oral, Daily, PRN: fever, 0 Refill(s)  ibuprofen: 600 mg, Oral, QID, PRN: mild pain (1-3), 0 Refill(s)  levothyroxine: 200 mcg, Oral, qAM, 0 Refill(s)  venlafaxine 75 mg oral  capsule, extended release: 75 mg, 1 caps, Oral, qPM, 30 caps, 0 Refill(s).      Past Medical/ Family/ Social History   Medical history: Reviewed as documented in chart.   Surgical history: Reviewed as documented in chart.   Family history: Not significant.   Social history: Reviewed as documented in chart.   Problem list:    Active Problems (10)  Aortic valve stenosis   BPH (benign prostatic hyperplasia)   Dental crowns status   Gastritis   H/O: HTN (hypertension)   High cholesterol   Hypothyroid   Syncope   Tongue cancer   Vasovagal episode   .      Physical Examination               Vital Signs   Vital Signs   09/07/2020 11:14 EDT Systolic Blood Pressure 123 mmHg    Diastolic Blood Pressure 82 mmHg    Temperature Oral 36.6 degC    Heart Rate Monitored 69 bpm    Respiratory Rate 16 br/min    SpO2 98 %   .   Measurements   09/07/2020 11:21 EDT Body Mass Index est meas 28.05 kg/m2   09/07/2020 11:21 EDT Body Mass Index Measured 28.05 kg/m2   09/07/2020 11:14 EDT Height/Length Measured 182 cm    Weight Dosing 92.9 kg   .   Basic Oxygen Information   09/07/2020 11:14 EDT Oxygen Therapy Room air    SpO2 98 %   .   General:  Alert, no acute distress.    Skin:  Warm, dry, pink.    Head:  Normocephalic, atraumatic.    Neck:  No JVD.   Eye:  Extraocular movements are intact, normal conjunctiva.    Cardiovascular:  Regular rate and rhythm, Normal peripheral perfusion, 2/6 systolic ejection murmur right upper sternal border.    Respiratory:  Lungs are clear to auscultation, respirations are non-labored, breath sounds are equal.    Gastrointestinal:  Soft, Nontender, Non distended, Normal bowel sounds.    Musculoskeletal:  No swelling.   Neurological:  No focal neurological deficit observed.   Psychiatric:  Cooperative, appropriate mood & affect.       Medical Decision Making   Documents reviewed:  Emergency department nurses' notes, flowsheet, emergency medical system run report, vital signs.    Electrocardiogram:  Emergency  Provider interpretation performed by me, See ECG ED Review, new lateral TWI.    Results review:  Lab results : Lab View   09/07/2020 13:24 EDT Appear U POC Clear    Color U POC Yellow    Bili U POC Negative    Blood U POC Negative    Glucose U POC Negative mg/dL    Ketones U POC Negative mg/dL    Leuk Est U POC Negative    Nitrite U POC Negative    pH U POC 7.0    Protein U POC Negative    Spec Grav U POC 1.020    Urobilin U POC 0.2 EU/dL   88/05/7977 87:84 EDT SARS-CoV-2 Only ([place]) Not Detected   09/07/2020 11:59 EDT Estimated  Creatinine Clearance 104.05 mL/min   09/07/2020 11:22 EDT WBC 5.1 x10e3/mcL    RBC 4.34 x10e6/mcL    Hgb 14.1 g/dL    HCT 57.4 %    MCV 02.0 fL    MCH 32.5 pg    MCHC 33.2 g/dL    RDW 88.0 %    Platelet 218 x10e3/mcL    MPV 9.4 fL    Neutro Auto 70.9 %    Neutro Absolute 3.6 x10e3/mcL    Immature Grans Percent 0.2 %    Immature Grans Absolute 0.01 x10e3/mcL    Lymph Auto 17.6 %    Lymph Absolute 0.9 x10e3/mcL  LOW    Mono Auto 8.1 %    Mono Absolute 0.4 x10e3/mcL    Eosinophil Percent 2.6 %    Eos Absolute 0.1 x10e3/mcL    Basophil Auto 0.6 %    Baso Absolute 0.0 x10e3/mcL    Sodium Lvl 139 mmol/L    Potassium Lvl 3.9 mmol/L    Chloride 101 mmol/L    CO2 26 mmol/L    Glucose Random 120 mg/dL  HI    BUN 21 mg/dL    Creatinine Lvl 0.8 mg/dL    AGAP 12 mmol/L    Osmolality Calc 281 mOsm/kg    Calcium Lvl 9.0 mg/dL    eGFR AA 893 fO/fpw/8.26f    eGFR Non-AA 92 mL/min/1.78m    Trop T Quant 0.020 ng/mL  HI   09/07/2020 11:21 EDT Estimated Creatinine Clearance 92.49 mL/min   .      Impression and Plan   Diagnosis   Syncope (ICD10-CM R55, Discharge, Medical)   S/P TAVR (transcatheter aortic valve replacement) (ICD10-CM Z95.2, Discharge, Medical)      Calls-Consults   -  WOODFIELD,  SCOTT-MD, recommends [facility] for further cardiac evaluation, possible pacemaker if any evidence of heart block.    Plan   Condition: Stable.    Notes: Patient presents after multiple syncopal episodes since TAVR 2  months ago.  He has mild upon elevation but reportedly had a negative heart catheterization prior to the TAVR placement a few months ago.  He otherwise has not had any profound bradycardia or heart block noted here.  That would be the main concern in the setting of recent TAVR.  Cardiology consult and he will be admitted for further cardiac monitoring and intervention as needed.

## 2020-09-07 NOTE — ED Notes (Signed)
 ED Patient Summary              [facility] Emergency Department  [address] [name], [place] [address]  [phone]  Discharge Instructions (Patient)  Ruben [name], Ruben [name]  DOB:  1951/10/19                   MRN: [id number]                   FIN: [id number]  Reason For Visit: Syncope/Near syncope; SYNCOPE  Final Diagnosis: S/P TAVR (transcatheter aortic valve replacement); Syncope     Visit Date: 09/07/2020 11:12:00  Address: [address] [place] [facility] [address]  Phone: [phone]     Emergency Department Providers:         Primary Physician:      Ruben [name]      [facility] would like to thank you for allowing us  to assist you with your healthcare needs. The following includes patient education materials and information regarding your injury/illness.     Follow-up Instructions:  You were seen today on an emergency basis. Please contact your primary care doctor for a follow up appointment. If you received a referral to a specialist doctor, it is important you follow-up as instructed.    It is important that you call your follow-up doctor to schedule and confirm the location of your next appointment. Your doctor may practice at multiple locations. The office location of your follow-up appointment may be different to the one written on your discharge instructions.    If you do not have a primary care doctor, please call [phone] for help in finding a [name] Provider. For help in finding a specialist doctor, please call [phone].    If your condition gets worse before your follow-up with your primary care doctor or specialist, please return to the Emergency Department.      Coronavirus 2019 (COVID-19) Reminders:     Patients age 34 - 39, with parental consent, and patients over age 90 can make an appointment for a COVID-19 vaccine. Patients can contact their [name] doctors' offices to schedule an appointment to receive  the COVID-19 vaccine. Patients who do not have a [name] physician can call [phone] to schedule vaccination appointments.      Follow Up Appointments:  Primary Care Provider:     Name: Ruben [name]     Phone: [phone]               Medications that have not changed  Other Medications  aspirin 81 Milligram Oral (given by mouth) every day as needed fever.  Last Dose:____________________  cyanocobalamin (Vitamin B-12 (cyanocobalamin) 1000 mcg/mL injectable solution) 1 Milliliter Subcutaneous (under the skin) once a month.  Last Dose:____________________  ibuprofen 600 Milligram Oral (given by mouth) 4 times a day as needed mild pain (1-3).  Last Dose:____________________  levothyroxine 200 Microgram Oral (given by mouth) once a day (in the morning).  Last Dose:____________________  silodosin (Rapaflo 8 mg oral capsule) 1 Capsules Oral (given by mouth) once a day (in the evening).  Last Dose:____________________  venlafaxine (venlafaxine 75 mg oral capsule, extended release) 1 Capsules Oral (given by mouth) once a day (in the evening)., DO NOT CRUSH  Last Dose:____________________      Allergy Info: No Known Allergies     >Discharge Additional Information    Patient Education Materials:       ---------------------------------------------------------------------------------------------------------------------  [name]) encourages you to self-enroll in the [facility] Patient Portal.  [facility] Patient Portal will allow you to manage your personal health information securely from your own electronic device now and in the future.  To begin your Patient Portal enrollment process, please visit [email or web address] Click on "Sign up now" under [facility].  If you find that you need additional assistance on the [facility] Patient Portal or need a copy of your medical records, please call the [name] Medical Records Office at  [phone].  Comment:

## 2020-09-07 NOTE — ED Notes (Signed)
ED Patient Education Note     Patient Education Materials Follows:

## 2020-09-08 LAB — POTASSIUM: Potassium: 4.4 mmol/L (ref 3.5–5.3)

## 2020-09-08 LAB — D-DIMER, QUANTITATIVE: D-Dimer, Quant: 0.35 mcg/mL FEU (ref 0.19–0.51)

## 2020-09-08 LAB — TROPONIN T
Troponin T: 0.901 ng/mL (ref 0.000–0.010)
Troponin T: 0.73 ng/mL (ref 0.000–0.010)

## 2020-09-08 NOTE — Case Communication (Signed)
CM D/C Planning Assessment Ongoing- Text       CM Admission Assessment Entered On:  09/08/2020 13:10 EDT    Performed On:  09/08/2020 13:07 EDT by Blenda Mounts RN, [place] Admission Assessment   CM Name of Patient Pharmacy :   [place], [place] - 09/08/2020 15:09 EDT   CM Date and Time Admission IM :   09/08/2020 15:00 EDT   CM Progress Note :   09/08/20- JGR - met with patient and his wife Hilda Blades. He lives in a single story home. No dme and no problems affording medications or getting to appointmnets.   Patient will have a pacemaker tomorrow. Case Mangaer reviewed admission IM, signed and filed in chart.  CM following. no needs anticipated.      CM Admission Assessment Complete :   Yes   Marveen Reeks - 09/08/2020 15:06 EDT   CM Reason for Care Management Referral :   Discharge planning assessment   CM Insurance Information :   Medicare, [place]. Name :   Medicare/AARP   CM Prescription Coverage :   Medicare D   CM Date and Time Inpatient Order :   09/07/2020 15:25 EDT   CM Patient has PCP :   Yes   CM PCP Search :   Ramiro Harvest L-DO   Discharge Transporation Needs :   No   CM Assessment Discussion With :   Patient   Marveen Reeks - 09/08/2020 13:07 EDT   CM Home/Lay Caregiver Name/Relationship :   Thedore Mins (wife)   Marveen Reeks - 09/08/2020 15:06 EDT     CM Home/Lay Caregiver Contact Number :   [phone]   CM Living Situation :   Home with no services, Family support   CM Patient Admitted From :   Home  no services   CM Initial Tentative Discharge Plan :   Home - no services   CM Alternate Discharge Plan :   pending clinical course   Anticipated Hosp Related Barriers to DC :   None identified   CM Home Barriers :   None   Marveen Reeks - 09/08/2020 13:07 EDT

## 2020-09-09 LAB — CBC
Hematocrit: 39.9 % (ref 38.0–52.0)
Hemoglobin: 13.4 g/dL (ref 13.0–17.3)
MCH: 32.7 pg (ref 27.0–34.5)
MCHC: 33.6 g/dL (ref 32.0–36.0)
MCV: 97.3 fL (ref 84.0–100.0)
MPV: 10 fL (ref 7.2–13.2)
NRBC Absolute: 0 10*3/uL (ref 0.000–0.012)
NRBC Automated: 0 % (ref 0.0–0.2)
Platelets: 195 10*3/uL (ref 140–440)
RBC: 4.1 x10e6/mcL (ref 4.00–5.60)
RDW: 12.3 % (ref 11.0–16.0)
WBC: 5.5 10*3/uL (ref 3.8–10.6)

## 2020-09-09 LAB — BASIC METABOLIC PANEL
Anion Gap: 12 mmol/L (ref 2–17)
BUN: 19 mg/dL (ref 8–23)
CO2: 23 mmol/L (ref 22–29)
Calcium: 9.1 mg/dL (ref 8.8–10.2)
Chloride: 106 mmol/L (ref 98–107)
Creatinine: 0.7 mg/dL (ref 0.7–1.3)
GFR African American: 112 mL/min/{1.73_m2} (ref >=90)
GFR Non-African American: 97 mL/min/{1.73_m2} (ref >=90)
Glucose: 96 mg/dL (ref 70–99)
OSMOLALITY CALCULATED: 283 mOsm/kg (ref 270–287)
Potassium: 4.2 mmol/L (ref 3.5–5.3)
Sodium: 141 mmol/L (ref 135–145)

## 2020-09-09 LAB — ADD ON LAB TEST

## 2020-09-10 NOTE — Nursing Note (Signed)
Nursing Discharge Summary - Text       Physician Discharge Summary Entered On:  09/10/2020 9:38 EDT    Performed On:  09/10/2020 9:37 EDT by Donivan Scull N-RN               DC Information   Provider Instructions for Activity :   May shower, Other: cover site with gauze and tape, change dressing everyday   Provider Instructions for Wound Care :   Keep incision clean and dry, Keep your dressing clean and dry, Keep your dressing clean and dry and change your dressing  daily   Donivan Scull N-RN - 09/10/2020 9:37 EDT

## 2020-09-10 NOTE — Nursing Note (Signed)
Nursing Discharge Summary - Text       Nursing Discharge Summary Entered On:  09/10/2020 10:35 EDT    Performed On:  09/10/2020 10:34 EDT by Donivan Scull N-RN               DC Information   Discharge To :   Home independently   Mode of Discharge :   Wheelchair   Transportation :   Private vehicle   Accompanied By :   Frances Nickels,  Efraim Kaufmann N-RN - 09/10/2020 10:34 EDT   Education   Responsible Learner(s) :   No Data Available     Home Caregiver Present for Session :   Yes   Barriers To Learning :   None evident   Teaching Method :   Explanation, Printed materials   Donivan Scull N-RN - 09/10/2020 10:34 EDT   Post-Hospital Education Adult Grid   Activity Expectations :   [employer] understanding   Community Resources :   [employer] understanding   Diagnostic Results :   Verbalizes understanding   Disease Process :   Verbalizes understanding   Equipment/Devices :   TEFL teacher understanding   Importance of Follow-Up Visits :   Verbalizes understanding   Pain Management :   TEFL teacher understanding   Physical Limitations :   Verbalizes understanding   Plan of Care :   Verbalizes understanding   Postoperative Instructions :   Verbalizes understanding   When to Call Health Care Provider :   Verbalizes understanding   Donivan Scull N-RN - 09/10/2020 10:34 EDT   Health Maintenance Education Adult Grid   Bathing/Hygiene :   TEFL teacher understanding   Diet/Nutrition :   TEFL teacher understanding   Exercise :   Verbalizes understanding   Donivan Scull N-RN - 09/10/2020 10:34 EDT   Medication Education Adult Grid   Med Dosage, Route, Scheduling :   TEFL teacher understanding   Med Generic/Brand Name, Purpose, Action :   Verbalizes understanding   Donivan Scull N-RN - 09/10/2020 10:34 EDT   Safety Education Adult Grid   Safety, Fall :   Verbalizes understanding   Donivan Scull N-RN - 09/10/2020 10:34 EDT   Additional Learner(s) Present :   Spouse   Time Spent Educating Patient :   10 minutes   Donivan Scull N-RN -  09/10/2020 10:34 EDT

## 2020-09-17 NOTE — Discharge Summary (Signed)
 Inpatient Patient Summary              [facility]  [address] [place], [place] [address]  [phone]  Discharge Instructions (Patient)  Name: Ruben [name], Ruben [name]  DOB:  08-09-1951                   MRN: [id number]                   FIN: [id number]  Reason For Visit: COVID MONOCLONAL ANTIBODY INFUSIO  Final Diagnosis:      Visit Date: 09/14/2020 18:10:08  Address: [address] [place] [facility] [address]  Phone: [phone]     Providers:         Primary Physician:      None         [facility] would like to thank you for allowing us  to assist you with your healthcare needs. The following includes patient education materials and information regarding your injury/illness.     Follow-up Instructions:    It is important that you call your follow-up doctor to schedule and confirm the location of your next appointment. Your doctor may practice at multiple locations. The office location of your follow-up appointment may be different to the one written on your discharge instructions.    If you do not have a primary care doctor, please call [phone] for help in finding a [name] Provider. For help in finding a specialist doctor, please call [phone].      Coronavirus 2019 (COVID-19) Reminders:     Patients age 48 - 58, with parental consent, and patients over age 92 can make an appointment for a COVID-19 vaccine. Patients can contact their [name] doctors' offices to schedule an appointment to receive the COVID-19 vaccine. Patients who do not have a [name] physician can call [phone] to schedule vaccination appointments.      Follow Up Appointments:  Primary Care Provider:     Name: ALAINE GLATTER L-DO     Phone: [phone]               Medications that have not changed  Other Medications  aspirin (aspirin 81 mg oral delayed release tablet) 1 Tabs Oral (given by mouth) every day.  Last  Dose:____________________  cyanocobalamin (Vitamin B-12 (cyanocobalamin) 1000 mcg/mL injectable solution) 1 Milliliter Subcutaneous (under the skin). every 3 weeks; next due on 09/10/20.  Last Dose:____________________  levothyroxine (levothyroxine 175 mcg (0.175 mg) oral tablet) 1 Tabs Oral (given by mouth) every day for 90 Days. Refills: 1.  Last Dose:____________________  silodosin (Rapaflo 8 mg oral capsule) 1 Capsules Oral (given by mouth) once a day (in the evening).  Last Dose:____________________  venlafaxine (venlafaxine 75 mg oral capsule, extended release) 1 Capsules Oral (given by mouth) once a day (in the evening)., DO NOT CRUSH  Last Dose:____________________      Allergy Info: No Known Allergies     Discharge Additional Information    ---------------------------------------------------------------------------------------------------------------------  [name]) encourages you to self-enroll in the [facility] Patient Portal.  [facility] Patient Portal will allow you to manage your personal health information securely from your own electronic device now and in the future.  To begin your Patient Portal enrollment process, please visit [email or web address] Click on "Sign up now" under [facility].  If you find that you need additional assistance on the [facility] Patient Portal or  need a copy of your medical records, please call the [facility] Medical Records Office at [phone].  Comment:

## 2020-09-17 NOTE — Care Plan (Signed)
Ambulatory Patient Education     Patient Education Materials Follows:                    [address]  [place], [place] [address]  T: [phone]      Casirivimab/Imdevimab Infusion After-Care Information    During your Infusion visit today you received a medication called Casirivimab/Imdevimab, also called REGN-COV, which is a product of [employer].  Please refer to the patient fact sheet you received for information about this medication.  You can also visit the website www.regeneron.com for additional information.    After discharge, you should take your home medication(s) as prescribed.  Although you received the Casirivimab/Imdevimab infusion, you still have the COVID-19 virus and are still contagious.  Continue to isolate at home, as instructed.  You may also continue to experience symptoms of that virus.  Bruising and slight discomfort at the IV site is common and should go away in a few days.  You may use a cold compress for comfort today if needed.  After that, warmth (like a heating pad) can help heal bruising at the site.  You can take over the counter medication to help with any symptoms you may have.  It is important that you follow up with your primary care provider within the next few days.  Please call their office to make an appointment.    You were observed during and after your infusion for any potential side effects or adverse reactions.  However, it is still possible for you to experience these after you go home.  Please call your primary care provider for any questions about symptoms that you experience.  For any signs/symptoms of an allergic reaction such as itching, rash/hives, difficulty breathing, or swelling to lips/mouth/tongue/throat, you should seek immediate medical attention.  You may also report any concerning or persistent side effects to FDA MedWatch at MacRetreat.be (call 1-800-FDA-1088), or contact [employer] at [phone].    Please wait at least 90 days before you get  a COVID-19 vaccine or complete your vaccine series, if you have not already done so.  By waiting 90 days, you will allow your body to make the best use of the vaccine when you receive it.  It is uncommon to be reinfected with COVID-19 during this time as your body's immune system will still remember how to right the virus from your current infection.        FACT SHEET FOR PATIENTS, PARENTS AND CAREGIVERS EMERGENCY USE AUTHORIZATION (EUA) OF REGEN-COVTM (casirivimab and imdevimab) FOR CORONAVIRUS DISEASE 2019 (COVID-19)  You are being given a medicine called REGEN-COV (casirivimab and imdevimab) for the treatment of coronavirus disease 2019 (COVID-19). This Fact Sheet contains information to help you understand the potential risks and potential benefits of taking REGEN-COV, which you may receive.   Receiving REGEN-COV may benefit certain people with COVID-19.   Read this Fact Sheet for information about REGEN-COV. Talk to your healthcare provider if you have questions. It is your choice to receive REGEN-COV or stop at any time.   WHAT IS COVID-19?   COVID-19 is caused by a virus called a coronavirus. People can get COVID-19 through contact with another person who has the virus.   COVID-19 illnesses have ranged from very mild (including some with no reported symptoms) to severe, including illness resulting in death. While information so far suggests that most COVID-19 illness is mild, serious illness can occur and may cause some of your other medical conditions to become worse.  People of all ages with severe, long-lasting (chronic) medical conditions like heart disease, lung disease, and diabetes, for example, and other conditions including obesity, seem to be at higher risk of being hospitalized for COVID-19. Older age, with or without other conditions, also places people at higher risk of being hospitalized for COVID- 19.   WHAT ARE THE SYMPTOMS OF COVID-19?   The symptoms of COVID-19 include fever, cough, and  shortness of breath, which may appear 2 to 14 days after exposure. Serious illness including breathing problems can occur and may cause your other medical conditions to become worse.   WHAT IS REGEN-COV (casirivimab and imdevimab)?   REGEN-COV is an investigational medicine used to treat mild to moderate symptoms of COVID-19 in non-hospitalized adults and adolescents (49 years of age and older who weigh at least 88 pounds (40 kg)), and who are at high risk for developing severe COVID-19 symptoms or the need for hospitalization. REGEN-COV is investigational because it is still being studied. There is limited information known about the safety and effectiveness of using REGEN-COV to treat people with COVID-19.   The FDA has authorized the emergency use of REGEN-COV for the treatment of COVID-19 under an Emergency Use Authorization (EUA). For more information on EUA, see the "What is an Emergency Use Authorization (EUA)?" section at the end of this Fact Sheet.   WHAT SHOULD I TELL MY HEALTH CARE PROVIDER BEFORE I RECEIVE REGEN-COV?  Tell your healthcare provider about all of your medical conditions, including if you:    Have any allergies    Are pregnant or plan to become pregnant    Are breastfeeding or plan to breastfeed    Have any serious illnesses    Are taking any medications (prescription, over-the-counter, vitamins, and herbal products)   HOW WILL I RECEIVE REGEN-COV (casirivimab and imdevimab)?    REGEN-COV consists of two investigational medicines, casirivimab and imdevimab, given together as a single intravenous infusion (through a vein).    You will receive one dose of REGEN-COV by intravenous infusion. The infusion will take 20 to 50 minutes or longer. Your healthcare provider will determine the duration of your infusion.    If your healthcare provider determines that you are unable to receive REGEN-COV as an intravenous infusion which would lead to a delay in treatment, then as an alternative,  REGEN-COV can be given together in the form of subcutaneous injection (medicine is injected in the tissue just under the skin). One dose will consist of 4 subcutaneous injections given in separate locations around the same time.   WHAT ARE THE IMPORTANT POSSIBLE SIDE EFFECTS OF REGEN-COV (casirivimab and imdevimab)?  Possible side effects of REGEN-COV are:    Allergic reactions. Allergic reactions can happen during and after infusion with REGEN-COV. Tell your healthcare provider right away if you get any of the following signs and symptoms of allergic reactions: fever, chills, nausea, headache, shortness of breath, low or high blood pressure, rapid or slow heart rate, chest discomfort or pain, weakness, confusion, feeling tired, wheezing, swelling of your lips, face, or throat, rash including hives, itching, muscle aches, feeling faint, dizziness and sweating. These reactions may be severe or life threatening.    Worsening symptoms after treatment: You may experience new or worsening symptoms after infusion, including fever, difficulty breathing, rapid or slow heart rate, tiredness, weakness or confusion. If these occur, contact your healthcare provider or seek immediate medical attention as some of these events have required hospitalization. It is unknown if these  events are related to treatment or are due to the progression of COVID-19.   The side effects of getting any medicine by vein may include brief pain, bleeding, bruising of the skin, soreness, swelling, and possible infection at the infusion site. The side effects of getting any medicine by subcutaneous injection may include pain, bruising of the skin, soreness, swelling, and possible infection at the injection site.   These are not all the possible side effects of REGEN-COV. Not a lot of people have been given REGEN-COV. Serious and unexpected side effects may happen. REGEN-COV is still being studied so it is possible that all of the risks are not known  at this time.   It is possible that REGEN-COV could interfere with your body's own ability to fight off a future infection of SARS-CoV-2. Similarly, REGEN-COV may reduce your body's immune response to a vaccine for SARS-CoV-2. Specific studies have not been conducted to address these possible risks. Talk to your healthcare provider if you have any questions.   WHAT OTHER TREATMENT CHOICES ARE THERE?   Like REGEN-COV (casirivimab and imdevimab), FDA may allow for the emergency use of other medicines to treat people with COVID-19. Go to SamedayNews.com.cy- preparedness-and-response/mcm-legal-regulatory-and-policy-framework/emergency-use- authorization for information on other medicines used to treat people with COVID-19. Your healthcare provider may talk with you about clinical trials you may be eligible for.   It is your choice to be treated or not to be treated with REGEN-COV. Should you decide not to receive REGEN-COV or stop it at any time, it will not change your standard medical care.   WHAT IF I AM PREGNANT OR BREASTFEEDING?   There is limited experience treating pregnant women or breastfeeding mothers with REGEN-COV (casirivimab and imdevimab). For a mother and unborn baby, the benefit of receiving REGEN-COV may be greater than the risk from the treatment. If you are pregnant or breastfeeding, discuss your options and specific situation with your healthcare provider.   HOW DO I REPORT SIDE EFFECTS WITH REGEN-COV (casirivimab and imdevimab)?  Tell your healthcare provider right away if you have any side effect that bothers you or does not go away.   Report side effects to FDA MedWatch at [employer]be or call 1-800-FDA-1088 or call 613-393-4889.   HOW CAN I LEARN MORE?    Ask your health care provider.   Visit www.REGENC[employer].com   Visit UKRank.es    Contact your local or state public health department.   WHAT IS AN EMERGENCY USE AUTHORIZATION (EUA)?    The [place] FDA has made REGEN-COV (casirivimab and imdevimab) available under an emergency access mechanism called an EUA. The EUA is supported by a Surveyor, minerals and Human Service (HHS) declaration that circumstances exist to justify the emergency use of drugs and biological products during the COVID-19 pandemic.   REGEN-COV has not undergone the same type of review as an FDA-approved product. In issuing an EUA under the COVID-19 public health emergency, the FDA must determine, among other things, that based on the totality of scientific evidence available, it is reasonable to believe that the product may be effective for diagnosing, treating, or preventing COVID-19, or a serious or life-threatening disease or condition caused by COVID-19; that the known and potential benefits of the product, when used to diagnose, treat, or prevent such disease or condition, outweigh the known and potential risks of such product; and that there are no adequate, approved and available alternatives. All of these criteria must be met to allow for the medicine to  be used in the treatment of patients during the COVID-19 pandemic.   The EUA for REGEN-COV is in effect for the duration of the COVID-19 declaration justifying emergency use of these products, unless terminated or revoked (after which the products may no longer be used).     Manufactured by:  [employer]   [address]   [place], [place] [address]     2021 [employer]. All rights reserved.   Revised: 04/2020

## 2020-09-23 LAB — COMPREHENSIVE METABOLIC PANEL
ALT: 26 U/L (ref 0–41)
AST: 17 U/L (ref 0–40)
Albumin/Globulin Ratio: 1.9 mmol/L (ref 1.00–2.70)
Albumin: 4.5 g/dL (ref 3.5–5.2)
Alk Phosphatase: 104 U/L (ref 40–130)
Anion Gap: 11 mmol/L (ref 2–17)
BUN: 19 mg/dL (ref 8–23)
CO2: 28 mmol/L (ref 22–29)
Calcium: 9.4 mg/dL (ref 8.8–10.2)
Chloride: 101 mmol/L (ref 98–107)
Creatinine: 0.8 mg/dL (ref 0.7–1.3)
GFR African American: 106 mL/min/{1.73_m2} (ref >=90)
GFR Non-African American: 92 mL/min/{1.73_m2} (ref >=90)
Globulin: 2 g/dL (ref 1.9–4.4)
Glucose: 111 mg/dL — ABNORMAL HIGH (ref 70–99)
OSMOLALITY CALCULATED: 282 mOsm/kg (ref 270–287)
Potassium: 4.8 mmol/L (ref 3.5–5.3)
Sodium: 140 mmol/L (ref 135–145)
Total Bilirubin: 0.5 mg/dL (ref 0.00–1.20)
Total Protein: 6.9 g/dL (ref 6.4–8.3)

## 2020-09-23 LAB — FERRITIN: Ferritin: 207.9 ng/mL (ref 30.0–400.0)

## 2020-09-23 LAB — CBC
Hematocrit: 42.2 % (ref 38.0–52.0)
Hemoglobin: 14.2 g/dL (ref 13.0–17.3)
MCH: 32.4 pg (ref 27.0–34.5)
MCHC: 33.6 g/dL (ref 32.0–36.0)
MCV: 96.3 fL (ref 84.0–100.0)
MPV: 10.3 fL (ref 7.2–13.2)
NRBC Absolute: 0 10*3/uL (ref 0.000–0.012)
NRBC Automated: 0 % (ref 0.0–0.2)
Platelets: 242 10*3/uL (ref 140–440)
RBC: 4.38 x10e6/mcL (ref 4.00–5.60)
RDW: 12.2 % (ref 11.0–16.0)
WBC: 6.2 10*3/uL (ref 3.8–10.6)

## 2020-09-23 LAB — VITAMIN B12: Vitamin B-12: 567 pg/mL (ref 232–1245)

## 2020-10-11 LAB — CBC WITH AUTO DIFFERENTIAL
Absolute Baso #: 0 10*3/uL (ref 0.0–0.2)
Absolute Eos #: 0.2 10*3/uL (ref 0.0–0.5)
Absolute Lymph #: 1.4 10*3/uL (ref 1.0–3.2)
Absolute Mono #: 0.5 10*3/uL (ref 0.3–1.0)
Basophils %: 0.7 % (ref 0.0–2.0)
Eosinophils %: 3.1 % (ref 0.0–7.0)
Hematocrit: 40.8 % (ref 38.0–52.0)
Hemoglobin: 13.5 g/dL (ref 13.0–17.3)
Immature Grans (Abs): 0.01 10*3/uL (ref 0.00–0.06)
Immature Granulocytes: 0.2 % (ref 0.1–0.6)
Lymphocytes: 25 % (ref 15.0–45.0)
MCH: 32.4 pg (ref 27.0–34.5)
MCHC: 33.1 g/dL (ref 32.0–36.0)
MCV: 97.8 fL (ref 84.0–100.0)
MPV: 9.9 fL (ref 7.2–13.2)
Monocytes: 9.7 % (ref 4.0–12.0)
NRBC Absolute: 0 10*3/uL (ref 0.000–0.012)
NRBC Automated: 0 % (ref 0.0–0.2)
Neutrophils %: 61.3 % (ref 42.0–74.0)
Neutrophils Absolute: 3.4 10*3/uL (ref 1.6–7.3)
Platelets: 182 10*3/uL (ref 140–440)
RBC: 4.17 x10e6/mcL (ref 4.00–5.60)
RDW: 13.1 % (ref 11.0–16.0)
WBC: 5.5 10*3/uL (ref 3.8–10.6)

## 2020-10-11 NOTE — ED Notes (Signed)
 ED Triage Note       ED Triage Adult Entered On:  10/11/2020 18:16 EST    Performed On:  10/11/2020 18:12 EST by LEWAYNE, RN, VIVIAN G               Triage   Numeric Rating Pain Scale :   0 = No pain   Chief Complaint :   pt states that he had an episode of V. Fib last night. Pt has a pacemaker that transmits to Dr. Hollice office. He received a call today from his office and was told to come to ED. Pt reports he did have some chest tightness during that episode   [place] Mode of Arrival :   Walking   Infectious Disease Documentation :   Document assessment   Temperature Oral :   37 degC(Converted to: 98.6 degF)    Heart Rate Monitored :   75 bpm   Respiratory Rate :   16 br/min   Systolic Blood Pressure :   119 mmHg   Diastolic Blood Pressure :   80 mmHg   SpO2 :   99 %   Oxygen Therapy :   Room air   Patient presentation :   None of the above   Chief Complaint or Presentation suggest infection :   No   Weight Dosing :   88.45 kg(Converted to: 195 lb 0 oz)    Height :   182.9 cm(Converted to: 6 ft 0 in)    Body Mass Index Dosing :   26 kg/m2   JAGER, RN, VIVIAN G - 10/11/2020 18:12 EST   DCP GENERIC CODE   Tracking Acuity :   3   Tracking Group :   ED [employer], RN, VIVIAN G - 10/11/2020 18:12 EST   ED General Section :   Document assessment   Pregnancy Status :   N/A   ED Allergies Section :   Document assessment   ED Reason for Visit Section :   Document assessment   ED Quick Assessment :   Patient appears awake, alert, oriented to baseline. Skin warm and dry. Moves all extremities. Respiration even and unlabored. Appears in no apparent distress.   LEWAYNE, RN, VIVIAN G - 10/11/2020 18:12 EST   ID Risk Screen Symptoms   Recent Travel History :   No recent travel   Close Contact with COVID-19 ID :   No   Last 14 days COVID-19 ID :   No   JAGER, RN, VIVIAN G - 10/11/2020 18:12 EST   Allergies   (As Of: 10/11/2020 18:16:40 EST)   Allergies (Active)   No Known Allergies  Estimated Onset Date:    Unspecified ; Created By:   Teresa Spates D; Reaction Status:   Active ; Category:   Drug ; Substance:   No Known Allergies ; Type:   Allergy ; Updated By:   Teresa Spates D; Source:   Paper Chart/Abstracting ; Reviewed Date:   09/17/2020 7:50 EST        Psycho-Social   Last 3 mo, thoughts killing self/others :   Patient denies   Right click within box for Suspected Abuse policy link. :   None   Feels Safe Where Live :   Yes   LEWAYNE RN, VIVIAN G - 10/11/2020 18:12 EST   ED Reason for Visit   (As Of: 10/11/2020 18:16:40 EST)   Problems(Active)    Aortic valve stenosis (  SNOMED CT  :899366986 )  Name of Problem:   Aortic valve stenosis ; Recorder:   TURNER, RN, LYNN R; Confirmation:   Confirmed ; Classification:   Patient Stated ; Code:   [id number] ; Contributor System:   PowerChart ; Last Updated:   03/15/2017 9:47 EDT ; Life Cycle Date:   02/08/2017 ; Life Cycle Status:   Active ; Vocabulary:   SNOMED CT   ; Comments:        03/15/2017 9:47 - Velma, RN, Dickey FALCON  pt states was told moderatestenosis per dr loni. Does not cause any symptoms at this time    03/15/2017 9:59 - Velma, RN, Dickey FALCON  pt said he had no problems with anesthesia that lasted about the same length of time as the surgery in Apr'18 and Dr loni had already cleared him for the surgery in Apr'18-notes in [employer].      BPH (benign prostatic hyperplasia) (SNOMED CT  :603268987 )  Name of Problem:   BPH (benign prostatic hyperplasia) ; Recorder:   TURNER, RN, LYNN R; Confirmation:   Confirmed ; Classification:   Patient Stated ; Code:   [id number] ; Contributor System:   PowerChart ; Last Updated:   02/08/2017 14:41 EDT ; Life Cycle Date:   02/08/2017 ; Life Cycle Status:   Active ; Vocabulary:   SNOMED CT        Dental crowns status (SNOMED CT  :[id number] )  Name of Problem:   Dental crowns status ; Recorder:   TURNER, RN, LYNN R; Confirmation:   Confirmed ; Classification:   Patient Stated ; Code:    [id number] ; Contributor System:   PowerChart ; Last Updated:   02/08/2017 14:42 EDT ; Life Cycle Date:   02/08/2017 ; Life Cycle Status:   Active ; Vocabulary:   SNOMED CT        Gastritis (SNOMED CT  :2158980 )  Name of Problem:   Gastritis ; Recorder:   KOMINE, RN, GAIL K; Confirmation:   Confirmed ; Classification:   Patient Stated ; Code:   2158980 ; Contributor System:   PowerChart ; Last Updated:   09/07/2020 11:18 EDT ; Life Cycle Date:   09/07/2020 ; Life Cycle Status:   Active ; Vocabulary:   SNOMED CT        H/O: HTN (hypertension) (SNOMED CT  :748325985 )  Name of Problem:   H/O: HTN (hypertension) ; Recorder:   TURNER, RN, LYNN R; Confirmation:   Confirmed ; Classification:   Patient Stated ; Code:   748325985 ; Contributor System:   PowerChart ; Last Updated:   02/08/2017 14:40 EDT ; Life Cycle Date:   02/08/2017 ; Life Cycle Status:   Active ; Vocabulary:   SNOMED CT        High cholesterol (SNOMED CT  :76716984 )  Name of Problem:   High cholesterol ; Recorder:   TURNER, RN, LYNN R; Confirmation:   Confirmed ; Classification:   Patient Stated ; Code:   76716984 ; Contributor System:   PowerChart ; Last Updated:   02/08/2017 14:39 EDT ; Life Cycle Date:   02/08/2017 ; Life Cycle Status:   Active ; Vocabulary:   SNOMED CT        Hypothyroid (SNOMED CT  :31731988 )  Name of Problem:   Hypothyroid ; Recorder:   TURNER, RN, LYNN R; Confirmation:   Confirmed ; Classification:   Patient Stated ; Code:   31731988 ; Contributor System:  PowerChart ; Last Updated:   02/08/2017 14:39 EDT ; Life Cycle Date:   02/08/2017 ; Life Cycle Status:   Active ; Vocabulary:   SNOMED CT        Syncope (SNOMED CT  :593559989 )  Name of Problem:   Syncope ; Recorder:   KOMINE, RN, GAIL K; Confirmation:   Confirmed ; Classification:   Patient Stated ; Code:   593559989 ; Contributor System:   PowerChart ; Last Updated:   09/07/2020 11:18 EDT ; Life Cycle Date:   09/07/2020 ; Life Cycle Status:   Active ; Vocabulary:    SNOMED CT        Tongue cancer (SNOMED CT  :517452989 )  Name of Problem:   Tongue cancer ; Onset Date:   02/04/2017 ; Recorder:   Velma RN, Dickey FALCON; Confirmation:   Confirmed ; Classification:   Patient Stated ; Code:   517452989 ; Contributor System:   PowerChart ; Last Updated:   03/15/2017 9:46 EDT ; Life Cycle Date:   03/15/2017 ; Life Cycle Status:   Active ; Vocabulary:   SNOMED CT   ; Comments:        03/15/2017 9:46 - Velma, RN, Dickey FALCON  had first surgery in Apr'18-now for Left Cervical Lymphadenectomy on 03/20/17      Vasovagal episode (SNOMED CT  :8221810986 )  Name of Problem:   Vasovagal episode ; Recorder:   KEE, RN, GAIL K; Confirmation:   Confirmed ; Classification:   Patient Stated ; Code:   8221810986 ; Contributor System:   PowerChart ; Last Updated:   09/07/2020 11:19 EDT ; Life Cycle Date:   09/07/2020 ; Life Cycle Status:   Active ; Vocabulary:   SNOMED CT          Diagnoses(Active)    Chest pain  Date:   10/11/2020 ; Diagnosis Type:   Reason For Visit ; Confirmation:   Complaint of ; Clinical Dx:   Chest pain ; Classification:   Medical ; Clinical Service:   Emergency medicine ; Code:   PNED ; Probability:   0 ; Diagnosis Code:   [id number]

## 2020-10-11 NOTE — ED Notes (Signed)
 ED Patient Summary              [facility] Emergency Department  [address] [place] [address]  [phone]  Discharge Instructions (Patient)  Name: Ruben [name], Ruben [name]  DOB:  15-Jun-1951                   MRN: [id number]                   FIN: [id number]  Reason For Visit: Chest pain; VFIB (SENT BY DR. LINNIE)  Final Diagnosis: Ventricular tachycardia (paroxysmal)     Visit Date: 10/11/2020 18:08:00  Address: [address] [place] [facility] [address]  Phone: [phone]     Emergency Department Providers:         Primary Physician:      MICHELINA CHARLESTON [facility] would like to thank you for allowing us  to assist you with your healthcare needs. The following includes patient education materials and information regarding your injury/illness.     Follow-up Instructions:  You were seen today on an emergency basis. Please contact your primary care doctor for a follow up appointment. If you received a referral to a specialist doctor, it is important you follow-up as instructed.    It is important that you call your follow-up doctor to schedule and confirm the location of your next appointment. Your doctor may practice at multiple locations. The office location of your follow-up appointment may be different to the one written on your discharge instructions.    If you do not have a primary care doctor, please call [phone] for help in finding a [name] Provider. For help in finding a specialist doctor, please call [phone].    If your condition gets worse before your follow-up with your primary care doctor or specialist, please return to the Emergency Department.      Coronavirus 2019 (COVID-19) Reminders:     Patients age 27 - 58, with parental consent, and patients over age 8 can make an appointment for a COVID-19 vaccine. Patients can contact their [name] doctors' offices to schedule an appointment to receive the COVID-19 vaccine.  Patients who do not have a [name] physician can call [phone] to schedule vaccination appointments.      Follow Up Appointments:  Primary Care Provider:     Name: ALAINE GLATTER L-DO     Phone: [phone]               Medications That Were Updated - Follow Current Instructions  Other Medications  Current levothyroxine (levothyroxine 175 mcg (0.175 mg) oral tablet) 1 Tabs Oral (given by mouth) every day.  Last Dose:____________________    Medications that have not changed  Other Medications  aspirin (aspirin 81 mg oral delayed release tablet) 1 Tabs Oral (given by mouth) every day.  Last Dose:____________________  cyanocobalamin (Vitamin B-12 (cyanocobalamin) 1000 mcg/mL injectable solution) 1 Milliliter Subcutaneous (under the skin). every 3 weeks; next due on 09/10/20.  Last Dose:____________________  silodosin (Rapaflo 8 mg oral capsule) 1 Capsules Oral (given by mouth) once a day (in the evening).  Last Dose:____________________  ubiquinone (Co-Q10) Oral (given by mouth).  Last Dose:____________________  venlafaxine (venlafaxine 75 mg oral capsule, extended release) 1 Capsules Oral (given by mouth) once a day (in the evening)., DO NOT CRUSH  Last Dose:____________________      Allergy Info: No  Known Allergies     Discharge Additional Information    Patient Education Materials:       ---------------------------------------------------------------------------------------------------------------------  [name]) encourages you to self-enroll in the [facility] Patient Portal.  [facility] Patient Portal will allow you to manage your personal health information securely from your own electronic device now and in the future.  To begin your Patient Portal enrollment process, please visit [email or web address] Click on "Sign up now" under [facility].  If you find that you need additional assistance on the [facility] Patient Portal or need a  copy of your medical records, please call the [facility] Medical Records Office at [phone].  Comment:

## 2020-10-11 NOTE — Nursing Note (Signed)
Adult Patient History Form-Text       Adult Patient History Entered On:  10/11/2020 22:48 EST    Performed On:  10/11/2020 22:43 EST by Mayford Knife, RN, CAROL A               General Info   Patient Identified :   Identification band   Patient Identified :   Ruben [name]   Information Given By :   Self   Preferred Mode of Communication :   Verbal, Written   Accompanied By :   None   Pregnancy Status :   N/A   Has the patient received chemotherapy or immunotherapy (cytotoxic)  in the last 48-72 hours? :   No   In Clinical Trial With Signed Consent for Related Condition :   N/A   Is the patient currently (2-3 days) receiving radiation treatment? :   No   Mayford Knife, RN, CAROL A - 10/11/2020 22:43 EST   Allergies   (As Of: 10/11/2020 22:48:02 EST)   Allergies (Active)   No Known Allergies  Estimated Onset Date:   Unspecified ; Created By:   Eric Form D; Reaction Status:   Active ; Category:   Drug ; Substance:   No Known Allergies ; Type:   Allergy ; Updated By:   Eric Form D; Source:   Paper Chart/Abstracting ; Reviewed Date:   10/11/2020 18:33 EST        Problem History   (As Of: 10/11/2020 22:48:02 EST)   Problems(Active)    BPH (benign prostatic hyperplasia) (SNOMED CT  :381017510 )  Name of Problem:   BPH (benign prostatic hyperplasia) ; Recorder:   TURNER, RN, LYNN R; Confirmation:   Confirmed ; Classification:   Patient Stated ; Code:   [id number] ; Contributor System:   Dietitian ; Last Updated:   02/08/2017 14:41 EDT ; Life Cycle Date:   02/08/2017 ; Life Cycle Status:   Active ; Vocabulary:   SNOMED CT        Dental crowns status (SNOMED CT  :[id number] )  Name of Problem:   Dental crowns status ; Recorder:   TURNER, RN, LYNN R; Confirmation:   Confirmed ; Classification:   Patient Stated ; Code:   [id number] ; Contributor System:   Dietitian ; Last Updated:   02/08/2017 14:42 EDT ; Life Cycle Date:   02/08/2017 ; Life Cycle Status:   Active ; Vocabulary:   SNOMED CT         Gastritis (SNOMED CT  :3299242 )  Name of Problem:   Gastritis ; Recorder:   Steele Sizer RN, Cipriano Mile; Confirmation:   Confirmed ; Classification:   Patient Stated ; Code:   6834196 ; Contributor System:   PowerChart ; Last Updated:   09/07/2020 11:18 EDT ; Life Cycle Date:   09/07/2020 ; Life Cycle Status:   Active ; Vocabulary:   SNOMED CT        H/O: HTN (hypertension) (SNOMED CT  :222979892 )  Name of Problem:   H/O: HTN (hypertension) ; Recorder:   TURNER, RN, LYNN R; Confirmation:   Confirmed ; Classification:   Patient Stated ; Code:   119417408 ; Contributor System:   PowerChart ; Last Updated:   02/08/2017 14:40 EDT ; Life Cycle Date:   02/08/2017 ; Life Cycle Status:   Active ; Vocabulary:   SNOMED CT        High cholesterol (SNOMED CT  :14481856 )  Name of Problem:   High  cholesterol ; Recorder:   TURNER, RN, LYNN R; Confirmation:   Confirmed ; Classification:   Patient Stated ; Code:   [id number] ; Contributor System:   DietitianowerChart ; Last Updated:   02/08/2017 14:39 EDT ; Life Cycle Date:   02/08/2017 ; Life Cycle Status:   Active ; Vocabulary:   SNOMED CT        Hypothyroid (SNOMED CT  :5409811968268011 )  Name of Problem:   Hypothyroid ; Recorder:   TURNER, RN, LYNN R; Confirmation:   Confirmed ; Classification:   Patient Stated ; Code:   [id number] ; Contributor System:   DietitianowerChart ; Last Updated:   02/08/2017 14:39 EDT ; Life Cycle Date:   02/08/2017 ; Life Cycle Status:   Active ; Vocabulary:   SNOMED CT        Syncope (SNOMED CT  :213086578406440010 )  Name of Problem:   Syncope ; Recorder:   Steele SizerKOMINE, RN, Cipriano MileGAIL K; Confirmation:   Confirmed ; Classification:   Patient Stated ; Code:   [id number] ; Contributor System:   DietitianowerChart ; Last Updated:   09/07/2020 11:18 EDT ; Life Cycle Date:   09/07/2020 ; Life Cycle Status:   Active ; Vocabulary:   SNOMED CT        Tongue cancer (SNOMED CT  :413244010482547010 )  Name of Problem:   Tongue cancer ; Onset Date:   02/04/2017 ; Recorder:   Selena Battenody, RN, August AlbinoEleanor F; Confirmation:   Confirmed ; Classification:    Patient Stated ; Code:   272536644482547010 ; Contributor System:   PowerChart ; Last Updated:   03/15/2017 9:46 EDT ; Life Cycle Date:   03/15/2017 ; Life Cycle Status:   Active ; Vocabulary:   SNOMED CT   ; Comments:        03/15/2017 9:46 - Selena Battenody, RN, August AlbinoEleanor F  had first surgery in Apr'18-now for Left Cervical Lymphadenectomy on 03/20/17      Vasovagal episode (SNOMED CT  :[phone] )  Name of Problem:   Vasovagal episode ; Recorder:   Steele SizerKOMINE, RN, Cipriano MileGAIL K; Confirmation:   Confirmed ; Classification:   Patient Stated ; Code:   [phone] ; Contributor System:   PowerChart ; Last Updated:   09/07/2020 11:19 EDT ; Life Cycle Date:   09/07/2020 ; Life Cycle Status:   Active ; Vocabulary:   SNOMED CT          Diagnoses(Active)    Chest pain  Date:   10/11/2020 ; Diagnosis Type:   Reason For Visit ; Confirmation:   Complaint of ; Clinical Dx:   Chest pain ; Classification:   Medical ; Clinical Service:   Emergency medicine ; Code:   PNED ; Probability:   0 ; Diagnosis Code:   [id number]      Ventricular tachycardia (paroxysmal)  Date:   10/11/2020 ; Diagnosis Type:   Discharge ; Confirmation:   Confirmed ; Clinical Dx:   Ventricular tachycardia (paroxysmal) ; Classification:   Medical ; Clinical Service:   Non-Specified ; Code:   ICD-10-CM ; Probability:   0 ; Diagnosis Code:   I47.2        Immunizations   Influenza Vaccine Status :   Received prior to admission, during current flu season   [place], [place], [place] A - 10/11/2020 22:43 EST   ID Risk Screen Symptoms   Recent Travel History :   No recent travel   Close Contact with COVID-19 ID :   No   Last 14 days  COVID-19 ID :   No   C. diff Symptom/History ID :   Neither of the above   MRSA/VRE Screening :   None of these apply   CRE Screening :   No   Mayford Knife, RN, CAROL A - 10/11/2020 22:43 EST   Bloodless Medicine   Is Blood Transfusion Acceptable to Patient :   Gabriela Eves, RN, CAROL A - 10/11/2020 22:43 EST   Nutrition   MST Does Your Current Diet Include :    None   Nutrition Screen for Malnutrition :   Patient denies   Mayford Knife, RN, CAROL A - 10/11/2020 22:43 EST   Functional   Sensory Deficits :   None   ADLs Prior to Admission :   Joellyn Rued, RN, CAROL A - 10/11/2020 22:43 EST   Social History   Social History   (As Of: 10/11/2020 22:48:02 EST)   Tobacco:        Never smoker   (Last Updated: 02/13/2017 11:17:13 EDT by Virgie Dad RN, Roderick Pee)          Electronic Cigarette/Vaping:        Never Electronic Cigarette Use.   (Last Updated: 09/07/2020 15:53:37 EDT by Primus Bravo R-RN)          Alcohol:        Past   Comments:  02/13/2017 11:17 - Virgie Dad, RN, Roderick Pee: NONE IN 10 YEARS   (Last Updated: 02/13/2017 11:17:49 EDT by Virgie Dad RN, Roderick Pee)          Substance Use:        Denies   (Last Updated: 02/08/2017 14:44:20 EDT by Mayford Knife, RN, LYNN R)            Spiritual   Faith/Denomination :   Ephriam Knuckles   Do you have any religious/spiritual/cultural beliefs that could impact the way your care is provided? :   No   Mayford Knife, RN, CAROL A - 10/11/2020 22:43 EST   Harm Screen   Suspect or Concern for: :   None   Feels Safe Where Live :   Yes   Last 3 mo, thoughts killing self/others :   Patient denies   Cognitively Impaired :   No   Ambulatory or Self Mobile in [facility] :   Yes   Mayford Knife, RN, CAROL A - 10/11/2020 22:43 EST   Advance Directive   Location of Advance Directive :   Family to bring in copy from home   Advance Directive :   Yes   Type of Advance Directive :   Living will, Medical durable power of attorney   Patient Wishes to Receive Further Information on Advance Directives :   No   Mayford Knife, RN, Okey Regal A - 10/11/2020 22:43 EST   Education   Written Language :   [place]   Caregiver/Advocate Primary Language :   [place]   Caregiver/Advocate Written Language :   [place]   Primary Language :   Colen Darling, RN, CAROL A - 10/11/2020 22:43 EST   Caregiver/Advocate Language   Patient :   Programme researcher, broadcasting/film/video, Verbal explanation   Family :   Programme researcher, broadcasting/film/video, Verbal explanation    Mayford Knife, RN, CAROL A - 10/11/2020 22:43 EST   Barriers to Learning :   None evident   Teaching Method :   Demonstration, Explanation   Responsible Learner Present for Session :   Gabriela Eves, RN, CAROL A - 10/11/2020 22:43 EST  Preventative Measures Information   Unit/Room Orientation :   Verbalizes understanding   Environmental Safety :   Verbalizes understanding   Hand Washing :   Verbalizes understanding   Infection Prevention :   Verbalizes understanding   Mayford Knife, RN, CAROL A - 10/11/2020 22:43 EST   DC Needs   CM Living Situation :   Home with no services   Home Caregiver Name/Relationship :   Damarri Rampy   wife   Current Living Situation :   [id number]   Anticipated Discharge Needs :   None   Mayford Knife, RN, CAROL A - 10/11/2020 22:43 EST   Valuables and Belongings   Valuables and Belongings   At Bedside :   Glasses, Cell phone, Clothes, sweat pants  black sneakers sweatshirt   Mayford Knife, RN, CAROL A - 10/11/2020 22:43 EST   Admission Complete   Admission Complete :   Yes   Mayford Knife, RN, CAROL A - 10/11/2020 22:43 EST

## 2020-10-11 NOTE — ED Notes (Signed)
ED Patient Education Note     Patient Education Materials Follows:

## 2020-10-11 NOTE — ED Notes (Signed)
ED Triage Note       ED Secondary Triage Entered On:  10/11/2020 19:50 EST    Performed On:  10/11/2020 19:47 EST by Sherrie George, RN, Jearld Fenton               General Information   Barriers to Learning :   None evident   Languages :   English   COVID-19 Vaccine Status :   2 Doses received   2 Doses Received Manufacturer :   Pfizer vaccine   ED Home Meds Section :   Document assessment   [facility] ED Fall Risk Section :   Document assessment   ED Advance Directives Section :   Document assessment   ED Palliative Screen :   Document assessment   Ulyess Mort - 10/11/2020 19:47 EST   (As Of: 10/11/2020 19:50:35 EST)   Problems(Active)    BPH (benign prostatic hyperplasia) (SNOMED CT  :161096045 )  Name of Problem:   BPH (benign prostatic hyperplasia) ; Recorder:   TURNER, RN, LYNN R; Confirmation:   Confirmed ; Classification:   Patient Stated ; Code:   409811914 ; Contributor System:   Dietitian ; Last Updated:   02/08/2017 14:41 EDT ; Life Cycle Date:   02/08/2017 ; Life Cycle Status:   Active ; Vocabulary:   SNOMED CT        Dental crowns status (SNOMED CT  :[id number] )  Name of Problem:   Dental crowns status ; Recorder:   TURNER, RN, LYNN R; Confirmation:   Confirmed ; Classification:   Patient Stated ; Code:   [id number] ; Contributor System:   Dietitian ; Last Updated:   02/08/2017 14:42 EDT ; Life Cycle Date:   02/08/2017 ; Life Cycle Status:   Active ; Vocabulary:   SNOMED CT        Gastritis (SNOMED CT  :8841660 )  Name of Problem:   Gastritis ; Recorder:   Steele Sizer RN, Cipriano Mile; Confirmation:   Confirmed ; Classification:   Patient Stated ; Code:   6301601 ; Contributor System:   PowerChart ; Last Updated:   09/07/2020 11:18 EDT ; Life Cycle Date:   09/07/2020 ; Life Cycle Status:   Active ; Vocabulary:   SNOMED CT        H/O: HTN (hypertension) (SNOMED CT  :093235573 )  Name of Problem:   H/O: HTN (hypertension) ; Recorder:   TURNER, RN, LYNN R; Confirmation:    Confirmed ; Classification:   Patient Stated ; Code:   220254270 ; Contributor System:   PowerChart ; Last Updated:   02/08/2017 14:40 EDT ; Life Cycle Date:   02/08/2017 ; Life Cycle Status:   Active ; Vocabulary:   SNOMED CT        High cholesterol (SNOMED CT  :62376283 )  Name of Problem:   High cholesterol ; Recorder:   TURNER, RN, LYNN R; Confirmation:   Confirmed ; Classification:   Patient Stated ; Code:   15176160 ; Contributor System:   Dietitian ; Last Updated:   02/08/2017 14:39 EDT ; Life Cycle Date:   02/08/2017 ; Life Cycle Status:   Active ; Vocabulary:   SNOMED CT        Hypothyroid (SNOMED CT  :73710626 )  Name of Problem:   Hypothyroid ; Recorder:   TURNER, RN, LYNN R; Confirmation:   Confirmed ; Classification:   Patient Stated ; Code:   94854627 ; Contributor System:   Dietitian ; Last  Updated:   02/08/2017 14:39 EDT ; Life Cycle Date:   02/08/2017 ; Life Cycle Status:   Active ; Vocabulary:   SNOMED CT        Syncope (SNOMED CT  :973532992 )  Name of Problem:   Syncope ; Recorder:   Steele Sizer RN, Cipriano Mile; Confirmation:   Confirmed ; Classification:   Patient Stated ; Code:   426834196 ; Contributor System:   Dietitian ; Last Updated:   09/07/2020 11:18 EDT ; Life Cycle Date:   09/07/2020 ; Life Cycle Status:   Active ; Vocabulary:   SNOMED CT        Tongue cancer (SNOMED CT  :222979892 )  Name of Problem:   Tongue cancer ; Onset Date:   02/04/2017 ; Recorder:   Selena Batten RN, August Albino; Confirmation:   Confirmed ; Classification:   Patient Stated ; Code:   119417408 ; Contributor System:   PowerChart ; Last Updated:   03/15/2017 9:46 EDT ; Life Cycle Date:   03/15/2017 ; Life Cycle Status:   Active ; Vocabulary:   SNOMED CT   ; Comments:        03/15/2017 9:46 - Selena Batten, RN, August Albino  had first surgery in Apr'18-now for Left Cervical Lymphadenectomy on 03/20/17      Vasovagal episode (SNOMED CT  :1448185631 )  Name of Problem:   Vasovagal episode ; Recorder:   Steele Sizer, RN, Cipriano Mile; Confirmation:   Confirmed ;  Classification:   Patient Stated ; Code:   [id number] ; Contributor System:   PowerChart ; Last Updated:   09/07/2020 11:19 EDT ; Life Cycle Date:   09/07/2020 ; Life Cycle Status:   Active ; Vocabulary:   SNOMED CT          Diagnoses(Active)    Chest pain  Date:   10/11/2020 ; Diagnosis Type:   Reason For Visit ; Confirmation:   Complaint of ; Clinical Dx:   Chest pain ; Classification:   Medical ; Clinical Service:   Emergency medicine ; Code:   PNED ; Probability:   0 ; Diagnosis Code:   [id number]      Ventricular tachycardia (paroxysmal)  Date:   10/11/2020 ; Diagnosis Type:   Discharge ; Confirmation:   Confirmed ; Clinical Dx:   Ventricular tachycardia (paroxysmal) ; Classification:   Medical ; Clinical Service:   Non-Specified ; Code:   ICD-10-CM ; Probability:   0 ; Diagnosis Code:   I47.2             -    Procedure History   (As Of: 10/11/2020 19:50:35 EST)     Procedure Dt/Tm:   02/13/2017 12:57:00 EDT ; Location:   [facility] ; Provider:   Janalyn Rouse; Anesthesia Type:   General ; Anesthesia Minutes:   0 ; Procedure Name:   Glossectomy (Partial) ; Procedure Minutes:   67 ; Comments:     02/13/2017 14:32 EDT - Terie Purser, RN, Vicente Serene  auto-populated from documented surgical case ; Clinical Service:   Surgery            Procedure Dt/Tm:   02/13/2017 12:57:00 EDT ; Location:   [facility] ; Provider:   Janalyn Rouse; Anesthesia Type:   General ; Anesthesia Minutes:   0 ; Procedure Name:   Skin Graft Split Thickness ; Procedure Minutes:   83 ; Comments:     02/13/2017 14:32 EDT - Terie Purser, RN, Vicente Serene  auto-populated from documented surgical case ; Clinical  Service:   Surgery            Anesthesia Minutes:   0 ; Procedure Name:   Wisdom tooth ; Procedure Minutes:   0            Anesthesia Minutes:   0 ; Procedure Name:   Colonoscopy ; Procedure Minutes:   0            Procedure Dt/Tm:   03/20/2017 13:08:00 EDT ; Location:   [facility] ; Provider:   Janalyn RouseBEHRENS-MD,  EDWARD; Anesthesia Type:   General ; :    SLATTERY-MD,  STEPHEN O; Anesthesia Minutes:   0 ; Procedure Name:   Neck Dissection (Left, Cervical) ; Procedure Minutes:   136 ; Comments:     03/20/2017 15:37 EDT - Lorelle FormosaHanna, RN, Dairl PonderSamantha L  auto-populated from documented surgical case ; Clinical Service:   Surgery            Anesthesia Minutes:   0 ; Procedure Name:   Dupuytren contracture of right palm ; Procedure Minutes:   0            Procedure Dt/Tm:   08/27/2019 ; Anesthesia Minutes:   0 ; Procedure Name:   Aortic valve replacement and replacement of ascending aorta ; Procedure Minutes:   0            Procedure Dt/Tm:   09/10/2019 ; Anesthesia Minutes:   0 ; Procedure Name:   Pacemaker catheter ; Procedure Minutes:   0            [facility] Fall Risk Assessment Tool   Hx of falling last 3 months ED Fall :   Yes (Physiological fall i.e. Syncope)   Patient confused or disoriented ED Fall :   No   Patient intoxicated or sedated ED Fall :   No   Patient impaired gait ED Fall :   No   Use a mobility assistance device ED Fall :   No   Patient altered elimination ED Fall :   No   [facility] ED Fall Score :   2    Sherrie Georgeecker, RN, Jearld Fentonileen C - 10/11/2020 19:47 EST   ED Advance Directive   Advance Directive :   Yes   Type of Advance Directive :   Living will, Medical durable power of attorney   Medical Durable Power   of Attorney Name :   Lindie SpruceDebra Inman   Decker, RN, Jearld Fentonileen C - 10/11/2020 19:47 EST   Palliative Care   Does the Patient have a Life Limiting Illness :   None of the above   Sherrie Georgeecker, RN, Jearld Fentonileen C - 10/11/2020 19:47 EST   Med Hx   Medication List   (As Of: 10/11/2020 19:50:35 EST)   Normal Order    iopamidol 76% Inj Soln 100 mL  :   iopamidol 76% Inj Soln 100 mL ; Status:   Completed ; Ordered As Mnemonic:   Isovue-370 ; Simple Display Line:   100 mL, IV Contrast, Once ; Ordering Provider:   Maryagnes AmosESCARZA,  ROBERT H-MD; Catalog Code:   iopamidol ; Order Dt/Tm:   10/11/2020 18:21:41 EST            Prescription/Discharge Order    levothyroxine  :   levothyroxine ; Status:   Completed ;  Ordered As Mnemonic:   levothyroxine 175 mcg (0.175 mg) oral tablet ; Simple Display Line:   175 mcg, 1 tabs, Oral, Daily, for 90 days, 90 tabs,  1 Refill(s) ; Ordering Provider:   Helene Shoe M-PA; Catalog Code:   levothyroxine ; Order Dt/Tm:   09/08/2020 13:23:53 EDT            Home Meds    ubiquinone  :   ubiquinone ; Status:   Documented ; Ordered As Mnemonic:   Co-Q10 ; Simple Display Line:   Oral, 0 Refill(s) ; Catalog Code:   ubiquinone ; Order Dt/Tm:   10/11/2020 19:50:25 EST          levothyroxine  :   levothyroxine ; Status:   Documented ; Ordered As Mnemonic:   levothyroxine 175 mcg (0.175 mg) oral tablet ; Simple Display Line:   175 mcg, 1 tabs, Oral, Daily, 0 Refill(s) ; Catalog Code:   levothyroxine ; Order Dt/Tm:   10/11/2020 19:50:31 EST          aspirin  :   aspirin ; Status:   Documented ; Ordered As Mnemonic:   aspirin 81 mg oral delayed release tablet ; Simple Display Line:   81 mg, 1 tabs, Oral, Daily, 0 Refill(s) ; Catalog Code:   aspirin ; Order Dt/Tm:   09/07/2020 15:51:08 EDT          cyanocobalamin  :   cyanocobalamin ; Status:   Documented ; Ordered As Mnemonic:   Vitamin B-12 (cyanocobalamin) 1000 mcg/mL injectable solution ; Simple Display Line:   1,000 mcg, 1 mL, Subcutaneous, every 3 weeks; next due on 09/10/20, 10 mL, 0 Refill(s) ; Catalog Code:   cyanocobalamin ; Order Dt/Tm:   09/07/2020 11:20:40 EDT          silodosin  :   silodosin ; Status:   Documented ; Ordered As Mnemonic:   Rapaflo 8 mg oral capsule ; Simple Display Line:   8 mg, 1 caps, Oral, qPM, 0 Refill(s) ; Catalog Code:   silodosin ; Order Dt/Tm:   02/08/2017 14:37:22 EDT          venlafaxine  :   venlafaxine ; Status:   Documented ; Ordered As Mnemonic:   venlafaxine 75 mg oral capsule, extended release ; Simple Display Line:   75 mg, 1 caps, Oral, qPM, 30 caps, 0 Refill(s) ; Ordering Provider:   Maylon Peppers; Catalog Code:   venlafaxine ; Order Dt/Tm:   02/08/2017 14:37:22 EDT ; Comment:   DO NOT CRUSH

## 2020-10-11 NOTE — Discharge Summary (Signed)
 ED Clinical Summary                        [facility]  [address]  [place], [place], [address]  [phone]           PERSON INFORMATION  Name: Ruben [name], Ruben [name] Age:  69 Years DOB: 1951-01-31   Sex: Male Language: English PCP: ALAINE GLATTER L-DO   Marital Status: Married Phone: [phone] Med Service: MED-Medicine   MRN: [id number] Acct# [id number] Arrival: 10/11/2020 18:08:00   Visit Reason: Chest pain; VFIB (SENT BY DR. LINNIE) Acuity: 3 LOS: 000 03:37   Address:    [address] [place] [facility] [address]   Diagnosis:    Ventricular tachycardia (paroxysmal)  Medications:          Medications That Were Updated - Follow Current Instructions  Other Medications  Current levothyroxine (levothyroxine 175 mcg (0.175 mg) oral tablet) 1 Tabs Oral (given by mouth) every day.  Last Dose:____________________    Medications that have not changed  Other Medications  aspirin (aspirin 81 mg oral delayed release tablet) 1 Tabs Oral (given by mouth) every day.  Last Dose:____________________  cyanocobalamin (Vitamin B-12 (cyanocobalamin) 1000 mcg/mL injectable solution) 1 Milliliter Subcutaneous (under the skin). every 3 weeks; next due on 09/10/20.  Last Dose:____________________  silodosin (Rapaflo 8 mg oral capsule) 1 Capsules Oral (given by mouth) once a day (in the evening).  Last Dose:____________________  ubiquinone (Co-Q10) Oral (given by mouth).  Last Dose:____________________  venlafaxine (venlafaxine 75 mg oral capsule, extended release) 1 Capsules Oral (given by mouth) once a day (in the evening)., DO NOT CRUSH  Last Dose:____________________      Medications Administered During Visit:                Medication Dose Route   iopamidol 100 mL IV Contrast               Allergies      No Known Allergies      Major Tests and Procedures:  The following procedures and tests were performed during your ED visit.  COMMON PROCEDURES%>  COMMON PROCEDURES COMMENTS%>                PROVIDER INFORMATION                Provider Role Assigned Sampson MICHELINA CHARLESTON H-MD ED Provider 10/11/2020 18:11:28    Gosnell, RN, Ester HERO ED Nurse 10/11/2020 19:04:37    Elmira, RN, Elma BROCKS ED Nurse 10/11/2020 19:32:11        Attending Physician:  KASSIE FALLOW BLOUNT-MD      Admit Doc  KASSIE FALLOW BLOUNT-MD     Consulting Doc  KASSIE FALLOW BLOUNT-MD     VITALS INFORMATION  Vital Sign Triage Latest   Temp Oral ORAL_1%> ORAL%>   Temp Temporal TEMPORAL_1%> TEMPORAL%>   Temp Intravascular INTRAVASCULAR_1%> INTRAVASCULAR%>   Temp Axillary AXILLARY_1%> AXILLARY%>   Temp Rectal RECTAL_1%> RECTAL%>   02 Sat 99 % 98 %   Respiratory Rate RATE_1%> RATE%>   Peripheral Pulse Rate PULSE RATE_1%>67 bpm PULSE RATE%>   Apical Heart Rate HEART RATE_1%> HEART RATE%>   Blood Pressure BLOOD PRESSURE_1%>/ BLOOD PRESSURE_1%>80 mmHg BLOOD PRESSURE%> / BLOOD PRESSURE%>78 mmHg                 Immunizations      No Immunizations Documented This Visit  DISCHARGE INFORMATION   Discharge Disposition: S Outpt-Admitted As Inpatient   Discharge Location:  [facility]   Discharge Date and Time:     ED Checkout Date and Time:  10/11/2020 21:45:53     DEPART REASON INCOMPLETE INFORMATION             Problems      No Problems Documented              Smoking Status      Never smoker         PATIENT EDUCATION INFORMATION  Instructions:          Follow up:            ED PROVIDER DOCUMENTATION     Patient:   Ruben [name]            MRN: [id number]            FIN: [id number]               Age:   83 years     Sex:  Male     DOB:  [birth year]   Associated Diagnoses:   Ventricular tachycardia (paroxysmal)   Author:   MICHELINA CHARLESTON H-MD      Basic Information   Time seen: Provider Seen (ST)   ED Provider/Time:    ESCARZA,  ROBERT H-MD / 10/11/2020 18:11  .   Additional information: Chief Complaint from Nursing Triage Note   Chief Complaint  Chief Complaint: pt states that he had an episode of V. Fib last night. Pt has a pacemaker that transmits to Dr. Hollice  office. He received a call today from his office and was told to come to ED. Pt reports he did have some chest tightness during that episode (10/11/20 18:12:00).      History of Present Illness   69 year old male sent for evaluation by the cardiology service because of a sustained run of ventricular tachycardia over the weekend.  Patient was symptomatic with some chest tightness and lightheadedness but no overt syncope.  Cardiology reports a sustained run of approximately 68 beats of V. tach.  No previous known ventricular dysrhythmia.  He does have a pacemaker which is how the information was transmitted to the cardiology service.  No recent travel or medication dosing changes.  No independent thromboembolic disease risk factors by history.  He does describe a history of atrophic gastritis and is on a generally bland diet but is not known to have malabsorption.  No vomiting or diarrhea.  No fevers, cough or difficulty breathing.  Currently symptom-free.      Review of Systems             Additional review of systems information: All other systems reviewed and otherwise negative.      Health Status   Allergies:    Allergic Reactions (Selected)  No Known Allergies.   Medications:  (Selected)   Documented Medications  Documented  Rapaflo 8 mg oral capsule: 8 mg, 1 caps, Oral, qPM, 0 Refill(s)  Vitamin B-12 (cyanocobalamin) 1000 mcg/mL injectable solution: 1,000 mcg, 1 mL, Subcutaneous, every 3 weeks; next due on 09/10/20, 10 mL, 0 Refill(s)  aspirin 81 mg oral delayed release tablet: 81 mg, 1 tabs, Oral, Daily, 0 Refill(s)  venlafaxine 75 mg oral capsule, extended release: 75 mg, 1 caps, Oral, qPM, 30 caps, 0 Refill(s).      Past Medical/ Family/ Social History   Surgical history: Reviewed as documented in chart.  Family history: Reviewed as documented in chart.   Social history: Reviewed as documented in chart.   Problem list: Per nurse's notes.      Physical Examination               Vital Signs   Vital Signs    10/11/2020 18:12 EST Systolic Blood Pressure 119 mmHg    Diastolic Blood Pressure 80 mmHg    Temperature Oral 37 degC    Heart Rate Monitored 75 bpm    Respiratory Rate 16 br/min    SpO2 99 %   .   Measurements   10/11/2020 18:16 EST Body Mass Index est meas 26.44 kg/m2    Body Mass Index Measured 26.44 kg/m2   10/11/2020 18:12 EST Height/Length Measured 182.9 cm    Weight Dosing 88.45 kg   .   Basic Oxygen Information   10/11/2020 18:12 EST Oxygen Therapy Room air    SpO2 99 %   .   Vital signs noted above  Gen.: Alert, no signs of toxicity  HEENT: Pupils equal and reactive, extraocular movements intact, no scleral icterus, mucous membranes moist, oropharynx clear  Neck: Supple, no nuchal rigidity, no thyromegaly  Chest: No tenderness or crepitus  Lungs: Clear to auscultation, no distress, oxygen saturation normal  Heart: Regular rate and rhythm, no audible murmur, no carotid bruit, no jugular venous distention  Abdomen: Soft, nondistended, nontender, no palpable masses.  Skin: Warm, dry and no rashes or icterus  Musculoskeletal: No swelling or deformity. No tenderness to palpation. Symmetric peripheral pulses. No edema.  Neurologic: Alert, normal gait, no focal motor or sensory deficits, no cranial nerve deficits, cerebellar function normal      Medical Decision Making   Electrocardiogram:  Emergency Provider interpretation performed by me, See ECG ED Review.    Results review:  Lab results : Lab View   10/11/2020 19:01 EST Estimated Creatinine Clearance 110.88 mL/min   10/11/2020 18:29 EST SARS-CoV-2 Only (Liat) Not Detected   10/11/2020 18:28 EST WBC 5.5 x10e3/mcL    RBC 4.17 x10e6/mcL    Hgb 13.5 g/dL    HCT 59.1 %    MCV 02.1 fL    MCH 32.4 pg    MCHC 33.1 g/dL    RDW 86.8 %    Platelet 182 x10e3/mcL    MPV 9.9 fL    Neutro Auto 61.3 %    Neutro Absolute 3.4 x10e3/mcL    Immature Grans Percent 0.2 %    Immature Grans Absolute 0.01 x10e3/mcL    Lymph Auto 25.0 %    Lymph Absolute 1.4 x10e3/mcL    Mono Auto 9.7 %     Mono Absolute 0.5 x10e3/mcL    Eosinophil Percent 3.1 %    Eos Absolute 0.2 x10e3/mcL    Basophil Auto 0.7 %    Baso Absolute 0.0 x10e3/mcL    NRBC Absolute Auto 0.000 x10e3/mcL    NRBC Percent Auto 0.0 %    Sodium Lvl 142 mmol/L    Potassium Lvl 3.9 mmol/L    Chloride 104 mmol/L    CO2 24 mmol/L    Glucose Random 89 mg/dL    BUN 17 mg/dL    Creatinine Lvl 0.7 mg/dL    AGAP 14 mmol/L    Osmolality Calc 284 mOsm/kg    Calcium Lvl 9.5 mg/dL    Calcium (Corrected) Lvl 9.3 mg/dL    Protein Total 6.8 g/dL    Albumin Lvl 4.3 g/dL    Globulin Calc 2.5 g/dL  AG Ratio Calc 1.70 mmol/L    Alk Phos 89 unit/L    AST 16 unit/L    ALT 22 unit/L    eGFR AA 112 mL/min/1.50m    eGFR Non-AA 97 mL/min/1.58m    Magnesium Lvl 2.2 mg/dL    Bili Total 9.62 mg/dL    Lipase Lvl 20 unit/L   10/11/2020 18:16 EST Estimated Creatinine Clearance 97.02 mL/min   .      Reexamination/ Reevaluation   Vital signs   Basic Oxygen Information   10/11/2020 18:12 EST Oxygen Therapy Room air    SpO2 99 %      10/11/2020 19:11:53: Normal clinical examination.  Monitoring in the ER reassuring.  EKG shows no acute abnormalities.  Other diagnostic studies unremarkable including CT angiography of the chest (preliminary review).  Patient will be admitted to the cardiology service.      Impression and Plan   Diagnosis   Ventricular tachycardia (paroxysmal) (ICD10-CM I47.2, Discharge, Medical)   Plan   Condition: Stable.    Disposition: Place in Observation Telemetry Unit.    Counseled: Patient, Regarding diagnosis, Regarding diagnostic results, Regarding treatment plan, Patient indicated understanding of instructions.

## 2020-10-11 NOTE — ED Provider Notes (Signed)
Chest pain        Patient:   Ruben [name], Ruben [name]            MRN: [id number]            FIN: [id number]               Age:   69 years     Sex:  Male     DOB:  12/28/1950   Associated Diagnoses:   Ventricular tachycardia (paroxysmal)   Author:   Charlies Constable H-MD      Basic Information   Time seen: Provider Seen (ST)   ED Provider/Time:    Charlies Constable H-MD / 10/11/2020 18:11  .   Additional information: Chief Complaint from Nursing Triage Note   Chief Complaint  Chief Complaint: pt states that he had an episode of V. Fib last night. Pt has a pacemaker that transmits to Dr. Deon Pilling office. He received a call today from his office and was told to come to ED. Pt reports he did have some chest tightness during that episode (10/11/20 18:12:00).      History of Present Illness   69 year old male sent for evaluation by the cardiology service because of a sustained run of ventricular tachycardia over the weekend.  Patient was symptomatic with some chest tightness and lightheadedness but no overt syncope.  Cardiology reports a sustained run of approximately 68 beats of V. tach.  No previous known ventricular dysrhythmia.  He does have a pacemaker which is how the information was transmitted to the cardiology service.  No recent travel or medication dosing changes.  No independent thromboembolic disease risk factors by history.  He does describe a history of atrophic gastritis and is on a generally bland diet but is not known to have malabsorption.  No vomiting or diarrhea.  No fevers, cough or difficulty breathing.  Currently symptom-free.      Review of Systems             Additional review of systems information: All other systems reviewed and otherwise negative.      Health Status   Allergies:    Allergic Reactions (Selected)  No Known Allergies.   Medications:  (Selected)   Documented Medications  Documented  Rapaflo 8 mg oral capsule: 8 mg, 1 caps, Oral, qPM, 0 Refill(s)  Vitamin B-12 (cyanocobalamin) 1000 mcg/mL  injectable solution: 1,000 mcg, 1 mL, Subcutaneous, every 3 weeks; next due on 09/10/20, 10 mL, 0 Refill(s)  aspirin 81 mg oral delayed release tablet: 81 mg, 1 tabs, Oral, Daily, 0 Refill(s)  venlafaxine 75 mg oral capsule, extended release: 75 mg, 1 caps, Oral, qPM, 30 caps, 0 Refill(s).      Past Medical/ Family/ Social History   Surgical history: Reviewed as documented in chart.   Family history: Reviewed as documented in chart.   Social history: Reviewed as documented in chart.   Problem list: Per nurse's notes.      Physical Examination               Vital Signs   Vital Signs   12/13/2534 64:40 EST Systolic Blood Pressure 347 mmHg    Diastolic Blood Pressure 80 mmHg    Temperature Oral 37 degC    Heart Rate Monitored 75 bpm    Respiratory Rate 16 br/min    SpO2 99 %   .   Measurements   10/11/2020 18:16 EST Body Mass Index est meas 26.44 kg/m2    Body  Mass Index Measured 26.44 kg/m2   10/11/2020 18:12 EST Height/Length Measured 182.9 cm    Weight Dosing 88.45 kg   .   Basic Oxygen Information   10/11/2020 18:12 EST Oxygen Therapy Room air    SpO2 99 %   .   Vital signs noted above  Gen.: Alert, no signs of toxicity  HEENT: Pupils equal and reactive, extraocular movements intact, no scleral icterus, mucous membranes moist, oropharynx clear  Neck: Supple, no nuchal rigidity, no thyromegaly  Chest: No tenderness or crepitus  Lungs: Clear to auscultation, no distress, oxygen saturation normal  Heart: Regular rate and rhythm, no audible murmur, no carotid bruit, no jugular venous distention  Abdomen: Soft, nondistended, nontender, no palpable masses.  Skin: Warm, dry and no rashes or icterus  Musculoskeletal: No swelling or deformity. No tenderness to palpation. Symmetric peripheral pulses. No edema.  Neurologic: Alert, normal gait, no focal motor or sensory deficits, no cranial nerve deficits, cerebellar function normal      Medical Decision Making   Electrocardiogram:  Emergency Provider interpretation performed by  me, See ECG ED Review.    Results review:  Lab results : Lab View   10/11/2020 19:01 EST Estimated Creatinine Clearance 110.88 mL/min   10/11/2020 18:29 EST SARS-CoV-2 Only ([facility]iat) Not Detected   10/11/2020 18:28 EST WBC 5.5 x10e3/mcL    RBC 4.17 x10e6/mcL    Hgb 13.5 g/dL    HCT 40.8 %    MCV 97.8 fL    MCH 32.4 pg    MCHC 33.1 g/dL    RDW 13.1 %    Platelet 182 x10e3/mcL    MPV 9.9 fL    Neutro Auto 61.3 %    Neutro Absolute 3.4 x10e3/mcL    Immature Grans Percent 0.2 %    Immature Grans Absolute 0.01 x10e3/mcL    Lymph Auto 25.0 %    Lymph Absolute 1.4 x10e3/mcL    Mono Auto 9.7 %    Mono Absolute 0.5 x10e3/mcL    Eosinophil Percent 3.1 %    Eos Absolute 0.2 x10e3/mcL    Basophil Auto 0.7 %    Baso Absolute 0.0 x10e3/mcL    NRBC Absolute Auto 0.000 x10e3/mcL    NRBC Percent Auto 0.0 %    Sodium Lvl 142 mmol/L    Potassium Lvl 3.9 mmol/L    Chloride 104 mmol/L    CO2 24 mmol/L    Glucose Random 89 mg/dL    BUN 17 mg/dL    Creatinine Lvl 0.7 mg/dL    AGAP 14 mmol/L    Osmolality Calc 284 mOsm/kg    Calcium Lvl 9.5 mg/dL    Calcium (Corrected) Lvl 9.3 mg/dL    Protein Total 6.8 g/dL    Albumin Lvl 4.3 g/dL    Globulin Calc 2.5 g/dL    AG Ratio Calc 1.70 mmol/L    Alk Phos 89 unit/L    AST 16 unit/L    ALT 22 unit/L    eGFR AA 112 mL/min/1.47m???    eGFR Non-AA 97 mL/min/1.732m??    Magnesium Lvl 2.2 mg/dL    Bili Total 0.37 mg/dL    Lipase Lvl 20 unit/L   10/11/2020 18:16 EST Estimated Creatinine Clearance 97.02 mL/min   .      Reexamination/ Reevaluation   Vital signs   Basic Oxygen Information   10/11/2020 18:12 EST Oxygen Therapy Room air    SpO2 99 %      10/11/2020 19:11:53: Normal clinical examination.  Monitoring in the  ER reassuring.  EKG shows no acute abnormalities.  Other diagnostic studies unremarkable including CT angiography of the chest (preliminary review).  Patient will be admitted to the cardiology service.      Impression and Plan   Diagnosis   Ventricular tachycardia (paroxysmal) (ICD10-CM I47.2,  Discharge, Medical)   Plan   Condition: Stable.    Disposition: Place in Observation Telemetry Unit.    Counseled: Patient, Regarding diagnosis, Regarding diagnostic results, Regarding treatment plan, Patient indicated understanding of instructions.    Signature Line     Electronically Signed on 10/11/2020 07:12 PM EST   ________________________________________________   Charlies Constable H-MD               Modified by: Charlies Constable H-MD on 10/11/2020 07:12 PM EST

## 2020-10-12 LAB — COMPREHENSIVE METABOLIC PANEL
ALT: 22 U/L (ref 0–41)
AST: 16 U/L (ref 0–40)
Albumin/Globulin Ratio: 1.7 mmol/L (ref 1.00–2.70)
Albumin: 4.3 g/dL (ref 3.5–5.2)
Alk Phosphatase: 89 U/L (ref 40–130)
Anion Gap: 14 mmol/L (ref 2–17)
BUN: 17 mg/dL (ref 8–23)
CALCIUM,CORRECTED,CCA: 9.3 mg/dL (ref 8.8–10.2)
CO2: 24 mmol/L (ref 22–29)
Calcium: 9.5 mg/dL (ref 8.8–10.2)
Chloride: 104 mmol/L (ref 98–107)
Creatinine: 0.7 mg/dL (ref 0.7–1.3)
GFR African American: 112 mL/min/{1.73_m2} (ref >=90)
GFR Non-African American: 97 mL/min/{1.73_m2} (ref >=90)
Globulin: 2.5 g/dL (ref 1.9–4.4)
Glucose: 89 mg/dL (ref 70–99)
OSMOLALITY CALCULATED: 284 mOsm/kg (ref 270–287)
Potassium: 3.9 mmol/L (ref 3.5–5.3)
Sodium: 142 mmol/L (ref 135–145)
Total Bilirubin: 0.37 mg/dL (ref 0.00–1.20)
Total Protein: 6.8 g/dL (ref 6.4–8.3)

## 2020-10-12 LAB — LIPASE: Lipase: 20 U/L (ref 13–60)

## 2020-10-12 LAB — TROPONIN T: Troponin T: <0.01 ng/mL (ref 0.000–0.010)

## 2020-10-12 LAB — TSH WITH REFLEX TO FT4: TSH: 0.366 mcIU/mL (ref 0.358–3.740)

## 2020-10-12 LAB — COVID-19: SARS-CoV-2: NOT DETECTED

## 2020-10-12 LAB — MAGNESIUM: Magnesium: 2.2 mg/dL (ref 1.6–2.6)

## 2020-10-12 NOTE — Letter (Signed)
Provider Letter                  [address]  [address]  [place], [place] [address]         [facility] Forest  [address]  [place]ie [place]  [place], [place] [address]    Re: Ruben [name]    Dear Talbert Forest,     I recently treated Ruben [name] at a [facility] facility. Attached you will find my provider documentation. If you need additional notes or results from this patient's encounter, please call the [facility] medical records office at [phone].            Let me know if you have any questions or concerns.    Sincerely,   Camdin Hegner    C C Providers:  Arvin Collard, Cora Collum.D.[facility]        The following document(s) were included in the letter:  October 12, 2020 10:12:46 EST - (10/12/2020) EP consultation

## 2020-10-12 NOTE — Case Communication (Signed)
CM D/C Planning Assessment Ongoing- Text       CM Admission Assessment Entered On:  10/12/2020 9:37 EST    Performed On:  10/12/2020 9:31 EST by Windell Moment Ann-RN               CM Admission Assessment   CM Date and Time Admission IM :   10/12/2020 9:30 EST   Lacie Draft, RNLennox Laity - 10/14/2020 17:16 EST   CM Date and Time Observation Order :   10/11/2020 19:11 EST   Windell Moment Ann-RN - 10/12/2020 9:38 EST   CM Reason for Care Management Referral :   Discharge planning assessment   CM Insurance Information :   Medicare   CM Insurance Co. Name :   Primary: Medicare Part A   Secondary: AARP   CM Name of Patient Pharmacy :   [facility] [address] [place]   CM Date and Time Waynetta Sandy Given :   10/12/2020 9:30 EST   CM Patient has PCP :   Yes   CM PCP Search :   Joya San L-DO   Discharge Transporation Needs :   No   CM Assessment Discussion With :   Patient   CM Home/Lay Caregiver Name/Relationship :   Xion Debruyne- spouse, [phone]   CM Living Situation :   Home with no services   CM Initial Tentative Discharge Plan :   Home   CM Alternate Discharge Plan :   Home with [facility]   Anticipated Hosp Related Barriers to DC :   None identified   CM Home Barriers :   None   CM Progress Note :   10/12/20 TS AFIB, pt reports gettting an ECHO today for diagnostics. CM able to complete the dc assessment remotely. MOON presented verbally @0930  and pt verbalizes understanding. Pt lives with spouse in [place] and is CIADLs. Not current with any HH or DME. Pt is seeing PCP, Dr. [place] Early and gets any Rxs filled at the [facility] at [facility]. [place]. CM assigned will continue to assess.      CM Admission Assessment Complete :   Yes   [place] Ann-RN - 10/12/2020 9:31 EST

## 2020-10-13 NOTE — Progress Notes (Signed)
Chaplaincy Note - Text       Chaplaincy Note Entered On:  10/13/2020 10:26 EST    Performed On:  10/13/2020 10:23 EST by Darcel Smalling               Chaplaincy Consult   Faith/Denomination :   Ephriam Knuckles   Importance of Faith :   Very important   Religious Spiritual Resources :   Acceptance of illness, Divine presence, Family support, Spiritual well being   Religious Spiritual Practices :   Music, Prayer   Religious Spiritual Concerns :   Loss of control   Religious Family Issues :   PT has sister from [place] visiting and his wife. PT said his wife and sister are best friends.   Additional Information, Chaplaincy :   PT said he was in God's hands and shared stories of how God has taken care of him in the past and he trusts God now and knows where he is going. PT also expressed hope that doctors would find out what was going on with him. PC offered prayer, presence.   Darcel Smalling - 10/13/2020 10:23 EST

## 2020-10-15 NOTE — Op Note (Signed)
Phase I Record - [facility]             Phase I Record - [facility] Summary                                                                   Primary Physician:        Johnney Ou    Case Number:              [id number]    Finalized Date/Time:      10/15/20 17:12:30    Pt. Name:                 Ruben [name], Ruben [name]    D.O.B./Sex:               09-May-1951    Male    Med Rec #:                [id number]    Physician:                Beverly Gust M-MD    Financial #:              [id number]    Pt. Type:                 I    Room/Bed:                 4018/01    Admit/Disch:              10/12/20 16:19:00 -    Institution:       [facility] Case Attendance - Phase I                                                                                            Entry 1                         Entry 2                                                                          Case Attendee             Rhea Belton,  Amy-RN    Role Performed            Surgeon Primary                 Post Anesthesia Care  Nurse    Time In     Time Out     Last Modified By:         Ballard Russell,  Amy-RN                              10/15/20 17:11:51               10/15/20 17:12:09      [facility] - Case Times - Phase I                                                                                               Entry 1                                                                                                          Phase I In                10/15/20 16:08:00               Phase I Out                     10/15/20 16:38:00    Phase I Discharge         10/15/20 16:38:00    Time     Last Modified ByLuiz Blare,  Amy-RN                              10/15/20 17:12:25              Finalized By: Rinaldo Ratel      Document Signatures                                                                             Signed ByLuiz Blare,  Amy-RN 10/15/20 17:12

## 2020-10-15 NOTE — Op Note (Signed)
Phase II Record - [facility]             Phase II Record - [facility] Summary                                                                  Primary Physician:        Johnney Ou    Case Number:              [id number]    Finalized Date/Time:      10/15/20 17:13:08    Pt. Name:                 Ruben [name], Ruben [name]    D.O.B./Sex:               14-Jul-1951    Male    Med Rec #:                [id number]    Physician:                Beverly Gust M-MD    Financial #:              [id number]    Pt. Type:                 I    Room/Bed:                 4018/01    Admit/Disch:              10/12/20 16:19:00 -    Institution:       [facility] Case Attendance - Phase II                                                                                           Entry 1                                                                                                          Case Attendee             Graves,  Amy-RN                 Role Performed                  Post Anesthesia Care  Nurse    Last Modified ByLuiz Blare,  Amy-RN                              10/15/20 17:12:45      [facility] - Case Times - Phase II                                                                                              Entry 1                                                                                                          Phase II In               10/15/20 16:38:00               Phase II Out                    10/15/20 17:00:00    Phase II Discharge        10/15/20 17:00:00    Time     Last Modified ByLuiz Blare,  Amy-RN                              10/15/20 17:12:58              Finalized By: Luiz Blare,  Amy-RN      Document Signatures                                                                             Signed ByLuiz Blare,  Amy-RN 10/15/20 17:13

## 2020-10-15 NOTE — Anesthesia Post-Procedure Evaluation (Signed)
Postanesthesia Evaluation        Patient:   Ruben [name], Ruben [name]            MRN: [id number]            FIN: [id number]               Age:   69 years     Sex:  Male     DOB:  [birth year]   Associated Diagnoses:   None   Author:   Alfredo Bach,  Roni Friberg-MD      Postoperative Information   Post Operative Info:       Patient location: PACU.       Assessment   Postanesthesia assessment   Respiratory function: Respiratory rate, airway, and oxygen saturation are at adequate levels.     Cardiovascular function: Heart Rate stable, Blood Pressure stable, Postoperative hydration status Adequate.     Mental status: appropriate for level of anesthesia.     Temperature: within normal limits.     Pain Control: Adequate.     Nausea/Vomiting: Absent.     Signature Line     Electronically Signed on 10/15/2020 04:22 PM EST   ________________________________________________   Alfredo Bach,  Shaddai Shapley-MD

## 2020-10-15 NOTE — Anesthesia Pre-Procedure Evaluation (Signed)
Preanesthesia Evaluation        Patient:   Ruben [name], Ruben [name]            MRN: [id number]            FIN: [id number]               Age:   69 years     Sex:  Male     DOB:  [birth year]   Associated Diagnoses:   None   Author:   Alfredo Bach,  Leona Pressly-MD      Preoperative Information   NPO:  NPO greater than 8 hours.    Anesthesia history     Patient's history: negative.     Family's history: negative.        Health Status   Allergies:    Allergic Reactions (Selected)  No Known Allergies   Current medications:    Home Medications (6) Active  aspirin 81 mg oral delayed release tablet 81 mg = 1 tabs, Oral, Daily  Co-Q10 1 tabs, Oral, Daily  levothyroxine 175 mcg (0.175 mg) oral tablet 175 mcg = 1 tabs, Oral, Daily  Rapaflo 8 mg oral capsule 8 mg = 1 caps, Oral, qPM  venlafaxine 75 mg oral capsule, extended release 75 mg = 1 caps, Oral, qPM  Vitamin B-12 (cyanocobalamin) 1000 mcg/mL injectable solution 1,000 mcg = 1 mL, Subcutaneous     Problem list:    Active Problems (9)  BPH (benign prostatic hyperplasia)   Dental crowns status   Gastritis   H/O: HTN (hypertension)   High cholesterol   Hypothyroid   Syncope   Tongue cancer   Vasovagal episode         Histories   Past Medical History:    No active or resolved past medical history items have been selected or recorded.   Procedure history:    Pacemaker catheter ([id number]) on 09/10/2019 at 67 Years.  Aortic valve replacement and replacement of ascending aorta ([id number]) on 08/27/2019 at 67 Years.  Neck Dissection (Left, Cervical) on 03/20/2017 at 65 Years.  Comments:  03/20/2017 15:37 EDT - Lorelle Formosa, RN, Samantha L  auto-populated from documented surgical case  Glossectomy (Partial) on 02/13/2017 at 65 Years.  Comments:  02/13/2017 14:32 EDT - Terie Purser, RN, Vicente Serene  auto-populated from documented surgical case  Skin Graft Split Thickness on 02/13/2017 at 65 Years.  Comments:  02/13/2017 14:32 EDT - Terie Purser, RN, Vicente Serene  auto-populated from documented surgical case  Colonoscopy ([id number]).  Wisdom  tooth ([id number]).  Dupuytren contracture of right palm ([id number]).   Social History        Social & Psychosocial Habits    Alcohol  02/13/2017  Use: Past    Comment: NONE IN 10 YEARS - 02/13/2017 11:17 - Virgie Dad RN, Roderick Pee    Substance Use  02/08/2017  Use: Denies    Tobacco  02/13/2017  Use: Never smoker    Electronic Cigarette/Vaping  09/07/2020  Electronic Cigarette Use: Never  .        Physical Examination   Vital Signs   10/15/2020 13:30 EST Systolic Blood Pressure 135 mmHg    Diastolic Blood Pressure 83 mmHg    Temperature Oral 36.9 degC    Peripheral Pulse Rate 61 bpm    Heart Rate Monitored 60 bpm    Respiratory Rate 16 br/min    Mean Arterial Pressure, Cuff 102 mmHg  HI    SpO2 96 %   10/15/2020 10:49 EST Systolic Blood Pressure 141 mmHg  HI  Diastolic Blood Pressure 81 mmHg    Temperature Oral 36.7 degC    Peripheral Pulse Rate 88 bpm    Respiratory Rate 18 br/min    Mean Arterial Pressure, Cuff 100 mmHg    SpO2 98 %   10/15/2020 6:35 EST Systolic Blood Pressure 124 mmHg    Diastolic Blood Pressure 75 mmHg    Temperature Oral 36.7 degC    Peripheral Pulse Rate 61 bpm    Heart Rate Monitored 61 bpm    Respiratory Rate 16 br/min    Mean Arterial Pressure, Cuff 90 mmHg    SpO2 98 %    SBP/DBP Cuff Details Left arm   10/15/2020 1:53 EST Systolic Blood Pressure 125 mmHg    Diastolic Blood Pressure 77 mmHg    Temperature Oral 36.7 degC    Peripheral Pulse Rate 58 bpm  LOW    Heart Rate Monitored 60 bpm    Respiratory Rate 20 br/min    Mean Arterial Pressure, Cuff 97 mmHg    SpO2 96 %    SBP/DBP Cuff Details Left arm   10/14/2020 19:59 EST Systolic Blood Pressure 136 mmHg    Diastolic Blood Pressure 81 mmHg    Temperature Oral 36.8 degC    Heart Rate Monitored 61 bpm    Respiratory Rate 18 br/min    Mean Arterial Pressure, Cuff 105 mmHg  HI    SpO2 97 %    SBP/DBP Cuff Details Left arm   10/14/2020 15:42 EST Systolic Blood Pressure 107 mmHg    Diastolic Blood Pressure 67 mmHg    Temperature Oral 36.8 degC     Peripheral Pulse Rate 69 bpm    Heart Rate Monitored 66 bpm    Respiratory Rate 18 br/min    Mean Arterial Pressure, Cuff 89 mmHg    SpO2 98 %    SBP/DBP Cuff Details Left arm   10/14/2020 9:45 EST Systolic Blood Pressure 135 mmHg    Diastolic Blood Pressure 80 mmHg    Temperature Oral 36.6 degC    Peripheral Pulse Rate 69 bpm    Heart Rate Monitored 67 bpm    Respiratory Rate 18 br/min    Mean Arterial Pressure, Cuff 108 mmHg  HI    SpO2 97 %    SBP/DBP Cuff Details Left arm   10/14/2020 9:38 EST Heart Rate Monitored 61 bpm    SpO2 96 %   10/14/2020 5:57 EST Systolic Blood Pressure 126 mmHg    Diastolic Blood Pressure 77 mmHg    Temperature Oral 36.9 degC    Peripheral Pulse Rate 63 bpm    Heart Rate Monitored 63 bpm    Respiratory Rate 20 br/min    Mean Arterial Pressure, Cuff 91 mmHg    SpO2 98 %    SBP/DBP Cuff Details Left arm   10/14/2020 1:18 EST Systolic Blood Pressure 133 mmHg    Diastolic Blood Pressure 78 mmHg    Temperature Oral 36.8 degC    Peripheral Pulse Rate 67 bpm    Heart Rate Monitored 65 bpm    Respiratory Rate 16 br/min    Mean Arterial Pressure, Cuff 98 mmHg    SpO2 96 %    SBP/DBP Cuff Details Left arm      Measurements from flowsheet : Measurements   10/15/2020 14:15 EST Dosing Weight Difference -2.8 %   10/14/2020 23:24 EST Body Mass Index est meas 27.17 kg/m2    Body Mass Index Measured 27.17 kg/m2   10/14/2020 22:00 EST  Weight Measured 90.9 kg    Scale Type Scale with rails   10/14/2020 9:38 EST Dosing Weight Difference -3.1 %      General:          Stress: No acute distress.         Appearance: Within normal limits.    Airway:       Mallampati classification: II (soft palate, fauces, uvula visible).    Respiratory:  Respirations.    Neurologic:  Alert.       Review / Management   Results review:  Lab results: 10/14/2020 23:24 EST      Estimated Creatinine Clearance            110.88 mL/min  .       Assessment and Plan   American Society of Anesthesiologists#(ASA) physical status  classification:  Class III.    Anesthetic Preoperative Plan     Anesthetic technique: General anesthesia.     Maintenance airway: Laryngeal mask airway.     Opioid Assessment: Opioid Na????ve.     Risks discussed.     Signature Line     Electronically Signed on 10/15/2020 02:34 PM EST   ________________________________________________   Alfredo Bach,  Joeli Fenner-MD

## 2020-10-15 NOTE — Case Communication (Signed)
CM Discharge Planning Assessment - Text       CM Discharge Plan Entered On:  10/15/2020 17:03 EST    Performed On:  10/15/2020 17:01 EST by Lacie Draft RN, Lennox Laity               CM Discharge Plan   Discharge To :   Home independently   CM Home/Lay Caregiver Name/Relationship :   Jed Kutch- spouse, [phone]   CM Date/Time Discharge IM :   10/15/2020 17:00 EST   CM Progress Note :   10/12/20 TS AFIB, pt reports gettting an ECHO today for diagnostics. CM able to complete the dc assessment remotely. MOON presented verbally @0930  and pt verbalizes understanding. Pt lives with spouse in [place] and is CIADLs. Not current with any HH or DME. Pt is seeing PCP, Dr. Reedsport Early and gets any Rxs filled at the [facility] at [facility]. [place]. CM assigned will continue to assess.     10/15/20 - JGR - pending chest x-ray results - possible dc today. s/p defibrilator placment. IM signed.      14/10/21 RN, Lacie Draft - 10/15/2020 17:01 EST

## 2020-10-16 NOTE — Case Communication (Signed)
CM Discharge Planning Assessment - Text       CM Discharge Plan Entered On:  10/16/2020 7:56 EST    Performed On:  10/16/2020 7:55 EST by Jolly Mango               CM Discharge Plan   Discharge To :   Home independently, Family support   CM Home/Lay Caregiver Name/Relationship :   Gustave Lindeman- spouse, [phone]   CM Medicare Patient :   Yes   CM StCM Statusatus :   IP   CM Date/Time Discharge IM :   10/15/2020 17:00 EST   CM Progress Note :   10/12/20 TS AFIB, pt reports gettting an ECHO today for diagnostics. CM able to complete the dc assessment remotely. MOON presented verbally @0930  and pt verbalizes understanding. Pt lives with spouse in [place] and is CIADLs. Not current with any HH or DME. Pt is seeing PCP, Dr. Reedsport Early and gets any Rxs filled at the [facility] at [facility]. [place]. CM assigned will continue to assess.     10/15/20 - JGR - pending chest x-ray results - possible dc today. s/p defibrilator placment. IM signed.     10/16/20 GG - Pt has d/c orders home. No CM needs indicated per d/c order and notes. Pt to schedule f/u med appts as instructed by MD. Medicare IM current. CM available if needs arise prior to d/c.       Discharge Planning Assessment Complete :   Yes   14/11/21 - 10/16/2020 7:55 EST

## 2020-10-16 NOTE — Nursing Note (Signed)
Nursing Discharge Summary - Text       Nursing Discharge Summary Entered On:  10/16/2020 10:57 EST    Performed On:  10/16/2020 10:56 EST by Donivan Scull N-RN               DC Information   Discharge To :   Home independently, Family support   Mode of Discharge :   Wheelchair   Transportation :   Private vehicle   Accompanied By :   Frances Nickels,  Efraim Kaufmann N-RN - 10/16/2020 10:56 EST   Education   Responsible Learner(s) :   Home Caregiver Name/Relationship: Audry Pili   wife        Performed by: Joseph Berkshire, RN, TONI L - 10/12/2020 10:00  Home Caregiver Phone Number: [phone]        Performed by: Luiz Ochoa, CAROL A - 10/11/2020 22:43     Home Caregiver Present for Session :   Yes   Teaching Method :   Explanation, Printed materials   Donivan Scull N-RN - 10/16/2020 10:56 EST   Post-Hospital Education Adult Grid   Activity Expectations :   [employer] understanding   Bladder Management :   Verbalizes understanding   Bowel Management :   [employer] understanding   Community Resources :   [employer] understanding   Diagnostic Results :   Verbalizes understanding   Disease Process :   Verbalizes understanding   Equipment/Devices :   TEFL teacher understanding   Importance of Follow-Up Visits :   Verbalizes understanding   Pain Management :   TEFL teacher understanding   Physical Limitations :   Verbalizes understanding   Plan of Care :   TEFL teacher understanding   Postoperative Instructions :   TEFL teacher understanding   Substance Abuse :   Verbalizes understanding   When to Call Health Care Provider :   Trenton Gammon understanding   Donivan Scull N-RN - 10/16/2020 10:56 EST   Medication Education Adult Grid   Med Dosage, Route, Scheduling :   TEFL teacher understanding   Med Generic/Brand Name, Purpose, Action :   Verbalizes understanding   Med Preadministration Procedures :   IT sales professional, Medication :   Verbalizes understanding   Donivan Scull N-RN - 10/16/2020 10:56 EST   Safety  Education Adult Scientist, research (medical), Fall :   Verbalizes understanding   Donivan Scull N-RN - 10/16/2020 10:56 EST   Additional Learner(s) Present :   Spouse   Time Spent Educating Patient :   20 minutes   Donivan Scull N-RN - 10/16/2020 10:56 EST

## 2020-10-22 DIAGNOSIS — K4090 Unilateral inguinal hernia, without obstruction or gangrene, not specified as recurrent: Secondary | ICD-10-CM | POA: Diagnosis not present

## 2020-10-22 DIAGNOSIS — N3289 Other specified disorders of bladder: Secondary | ICD-10-CM | POA: Diagnosis not present

## 2020-10-22 DIAGNOSIS — N2889 Other specified disorders of kidney and ureter: Secondary | ICD-10-CM | POA: Diagnosis not present

## 2020-10-22 DIAGNOSIS — N281 Cyst of kidney, acquired: Secondary | ICD-10-CM | POA: Diagnosis not present

## 2020-11-09 LAB — PROSTATE SPECIFIC ANTIGEN, TOTAL: PSA: 2.7 ng/mL (ref 0.000–4.000)

## 2020-11-16 ENCOUNTER — Other Ambulatory Visit: Payer: Self-pay

## 2020-11-16 ENCOUNTER — Other Ambulatory Visit: Payer: Medicare HMO

## 2020-11-16 DIAGNOSIS — Z20822 Contact with and (suspected) exposure to covid-19: Secondary | ICD-10-CM | POA: Diagnosis not present

## 2020-11-16 LAB — BASIC METABOLIC PANEL
Anion Gap: 11 mmol/L (ref 2–17)
BUN: 22 mg/dL (ref 8–23)
CO2: 28 mmol/L (ref 22–29)
Calcium: 8.8 mg/dL (ref 8.8–10.2)
Chloride: 102 mmol/L (ref 98–107)
Creatinine: 0.6 mg/dL — ABNORMAL LOW (ref 0.7–1.3)
GFR African American: 119 mL/min/{1.73_m2} (ref >=90)
GFR Non-African American: 103 mL/min/{1.73_m2} (ref >=90)
Glucose: 85 mg/dL (ref 70–99)
OSMOLALITY CALCULATED: 284 mOsm/kg (ref 270–287)
Potassium: 3.9 mmol/L (ref 3.5–5.3)
Sodium: 141 mmol/L (ref 135–145)

## 2020-11-16 LAB — CBC
Hematocrit: 41.9 % (ref 38.0–52.0)
Hemoglobin: 14.1 g/dL (ref 13.0–17.3)
MCH: 32.5 pg (ref 27.0–34.5)
MCHC: 33.7 g/dL (ref 32.0–36.0)
MCV: 96.5 fL (ref 84.0–100.0)
MPV: 11 fL (ref 7.2–13.2)
NRBC Absolute: 0 10*3/uL (ref 0.000–0.012)
NRBC Automated: 0 % (ref 0.0–0.2)
Platelets: 209 10*3/uL (ref 140–440)
RBC: 4.34 x10e6/mcL (ref 4.00–5.60)
RDW: 12.6 % (ref 11.0–16.0)
WBC: 4.2 10*3/uL (ref 3.8–10.6)

## 2020-11-21 LAB — NOVEL CORONAVIRUS, NAA: SARS-CoV-2, NAA: NOT DETECTED

## 2020-12-27 ENCOUNTER — Emergency Department (HOSPITAL_COMMUNITY)
Admission: EM | Admit: 2020-12-27 | Discharge: 2020-12-27 | Disposition: A | Discharge status: Home / Self Care | Payer: Medicare HMO | Attending: Emergency Medicine | Admitting: Emergency Medicine

## 2020-12-27 DIAGNOSIS — I10 Essential (primary) hypertension: Secondary | ICD-10-CM | POA: Diagnosis not present

## 2020-12-27 DIAGNOSIS — J441 Chronic obstructive pulmonary disease with (acute) exacerbation: Secondary | ICD-10-CM | POA: Diagnosis not present

## 2020-12-27 DIAGNOSIS — N182 Chronic kidney disease, stage 2 (mild): Secondary | ICD-10-CM | POA: Insufficient documentation

## 2020-12-27 DIAGNOSIS — R0902 Hypoxemia: Secondary | ICD-10-CM | POA: Diagnosis not present

## 2020-12-27 DIAGNOSIS — Z79899 Other long term (current) drug therapy: Secondary | ICD-10-CM | POA: Diagnosis not present

## 2020-12-27 DIAGNOSIS — Z7982 Long term (current) use of aspirin: Secondary | ICD-10-CM | POA: Diagnosis not present

## 2020-12-27 DIAGNOSIS — Z87891 Personal history of nicotine dependence: Secondary | ICD-10-CM | POA: Diagnosis not present

## 2020-12-27 DIAGNOSIS — Z7951 Long term (current) use of inhaled steroids: Secondary | ICD-10-CM | POA: Insufficient documentation

## 2020-12-27 DIAGNOSIS — J45909 Unspecified asthma, uncomplicated: Secondary | ICD-10-CM | POA: Diagnosis not present

## 2020-12-27 DIAGNOSIS — R059 Cough, unspecified: Secondary | ICD-10-CM | POA: Diagnosis not present

## 2020-12-27 DIAGNOSIS — Z20822 Contact with and (suspected) exposure to covid-19: Secondary | ICD-10-CM | POA: Insufficient documentation

## 2020-12-27 DIAGNOSIS — I129 Hypertensive chronic kidney disease with stage 1 through stage 4 chronic kidney disease, or unspecified chronic kidney disease: Secondary | ICD-10-CM | POA: Insufficient documentation

## 2020-12-27 DIAGNOSIS — R062 Wheezing: Secondary | ICD-10-CM | POA: Diagnosis not present

## 2020-12-27 DIAGNOSIS — R0602 Shortness of breath: Secondary | ICD-10-CM | POA: Diagnosis not present

## 2020-12-27 LAB — CBC WITH DIFFERENTIAL/PLATELET
Abs Immature Granulocytes: 0.01 10*3/uL (ref 0.00–0.07)
Basophils Absolute: 0 10*3/uL (ref 0.0–0.1)
Basophils Relative: 0 %
Eosinophils Absolute: 0.3 10*3/uL (ref 0.0–0.5)
Eosinophils Relative: 4 %
HCT: 35.6 % — ABNORMAL LOW (ref 39.0–52.0)
Hemoglobin: 11.9 g/dL — ABNORMAL LOW (ref 13.0–17.0)
Immature Granulocytes: 0 %
Lymphocytes Relative: 59 %
Lymphs Abs: 4.7 10*3/uL — ABNORMAL HIGH (ref 0.7–4.0)
MCH: 32 pg (ref 26.0–34.0)
MCHC: 33.4 g/dL (ref 30.0–36.0)
MCV: 95.7 fL (ref 80.0–100.0)
Monocytes Absolute: 0.5 10*3/uL (ref 0.1–1.0)
Monocytes Relative: 7 %
Neutro Abs: 2.3 10*3/uL (ref 1.7–7.7)
Neutrophils Relative %: 30 %
Platelets: 261 10*3/uL (ref 150–400)
RBC: 3.72 MIL/uL — ABNORMAL LOW (ref 4.22–5.81)
RDW: 13.1 % (ref 11.5–15.5)
WBC: 7.9 10*3/uL (ref 4.0–10.5)
nRBC: 0 % (ref 0.0–0.2)

## 2020-12-27 LAB — BASIC METABOLIC PANEL
Anion gap: 8 (ref 5–15)
BUN: 10 mg/dL (ref 8–23)
CO2: 25 mmol/L (ref 22–32)
Calcium: 8.9 mg/dL (ref 8.9–10.3)
Chloride: 109 mmol/L (ref 98–111)
Creatinine, Ser: 1.01 mg/dL (ref 0.61–1.24)
GFR, Estimated: >60 mL/min (ref >60)
Glucose, Bld: 114 mg/dL — ABNORMAL HIGH (ref 70–99)
Potassium: 3.6 mmol/L (ref 3.5–5.1)
Sodium: 142 mmol/L (ref 135–145)

## 2020-12-27 LAB — RESP PANEL BY RT-PCR (FLU A&B, COVID) ARPGX2
Influenza A by PCR: NEGATIVE
Influenza B by PCR: NEGATIVE
SARS Coronavirus 2 by RT PCR: NEGATIVE

## 2020-12-27 LAB — BRAIN NATRIURETIC PEPTIDE: B Natriuretic Peptide: 24.4 pg/mL (ref 0.0–100.0)

## 2020-12-27 MED ORDER — PREDNISONE 10 MG PO TABS: 20.0000 mg | ORAL_TABLET | Freq: Two times a day (BID) | ORAL | 0 refills | Status: AC

## 2020-12-27 MED ORDER — ALBUTEROL SULFATE (2.5 MG/3ML) 0.083% IN NEBU
2.5000 mg | INHALATION_SOLUTION | Freq: Once | RESPIRATORY_TRACT | Status: AC
  Administered 2020-12-27: 2.5 mg via RESPIRATORY_TRACT
  Filled 2020-12-27: qty 3

## 2020-12-27 MED ORDER — IPRATROPIUM-ALBUTEROL 0.5-2.5 (3) MG/3ML IN SOLN
3.0000 mL | Freq: Once | RESPIRATORY_TRACT | Status: AC
  Administered 2020-12-27: 3 mL via RESPIRATORY_TRACT
  Filled 2020-12-27: qty 3

## 2020-12-27 MED ORDER — METHYLPREDNISOLONE SODIUM SUCC 125 MG IJ SOLR: 60.0000 mg | Freq: Once | INTRAMUSCULAR | Status: DC

## 2020-12-27 MED ORDER — ALBUTEROL SULFATE HFA 108 (90 BASE) MCG/ACT IN AERS: 1.0000 | INHALATION_SPRAY | Freq: Four times a day (QID) | RESPIRATORY_TRACT | 0 refills | Status: DC | PRN

## 2020-12-27 NOTE — Discharge Instructions (Addendum)
Call your primary care doctor or specialist as discussed in the next 2-3 days.   Return immediately back to the ER if:  Your symptoms worsen within the next 12-24 hours. You develop new symptoms such as new fevers, persistent vomiting, new pain, shortness of breath, or new weakness or numbness, or if you have any other concerns.  

## 2020-12-27 NOTE — ED Provider Notes (Signed)
[facility] DEPT Provider Note   CSN: [id number] Arrival date & time: 12/27/20  6010     History Chief Complaint  Patient presents with  . Shortness of Breath    Ems called out for shortness of breath - short of breath for the past 3 days that is worse when walking, cough for 1 month    Bruce Sullivan is a 70 y.o. male.  Patient presents ER chief complaint of shortness of breath.  He states he feels like a COPD exacerbation.  Symptoms have been waxing and waning over the course of last month, worse in the last 3 days.  He has been using his inhaler at home without significant improvement per patient.  Otherwise denies any fevers or vomiting.  Has had unchanged cough for about a month.  Denies any headache or chest pain or abdominal pain.  He states he has been vaccinated for COVID.        Past Medical History:  Diagnosis Date  . Anxiety   . Asthma   . Atherosclerosis 01/16/2015   Noted on CXR  . Bilateral lower extremity edema 01/16/2015  . Chronic kidney disease (CKD), stage II (mild)   . COPD (chronic obstructive pulmonary disease) ([place])   . Depression   . GERD (gastroesophageal reflux disease)   . Gout   . History of colon polyps   . Hoarseness   . Hyperlipidemia   . Hypertension   . Neck abscess 09/03/2016  . Pulmonary emphysema ([place])   . Seizure ([place])   . Stroke ([place]) 2014  . Thyroid disease   . TIA (transient ischemic attack)   . Urinary frequency   . Urine retention   . UTI (urinary tract infection)     Patient Active Problem List   Diagnosis Date Noted  . History of alcohol dependence ([place]) 12/18/2019  . BPH (benign prostatic hyperplasia) 12/18/2019  . Hepatitis C antibody positive in blood 12/18/2019  . Abdominal distension 12/18/2019  . Current moderate episode of major depressive disorder without prior episode ([place]) 04/10/2017  . Stage 2 chronic kidney disease 04/10/2017  . Chronic obstructive pulmonary disease ([place])  03/26/2017  . Essential hypertension 03/26/2017  . GAD (generalized anxiety disorder) 03/26/2017  . GERD (gastroesophageal reflux disease) 03/26/2017  . History of CVA (cerebrovascular accident) 03/26/2017  . Mixed hyperlipidemia 03/26/2017  . Seizure disorder ([place]) 03/26/2017    Past Surgical History:  Procedure Laterality Date  . COLONOSCOPY  08/29/2018  . CYSTOSCOPY WITH BIOPSY N/A 11/20/2018   Procedure: CYSTOSCOPY WITH BLADDER BIOPSY;  Surgeon: Ceasar Mons, MD;  Location: [facility];  Service: Urology;  Laterality: N/A;  . HERNIA REPAIR    . t monitor    . TRANSURETHRAL RESECTION OF PROSTATE N/A 11/20/2018   Procedure: TRANSURETHRAL RESECTION OF THE PROSTATE (TURP);  Surgeon: Ceasar Mons, MD;  Location: [facility];  Service: Urology;  Laterality: N/A;       Family History  Problem Relation Age of Onset  . Liver cancer Neg Hx   . Liver disease Neg Hx     Social History   Tobacco Use  . Smoking status: Former Research scientist (life sciences)  . Smokeless tobacco: Never Used  Vaping Use  . Vaping Use: Never used  Substance Use Topics  . Alcohol use: No  . Drug use: No    Home Medications Prior to Admission medications   Medication Sig Start Date End Date Taking? Authorizing Provider  albuterol (VENTOLIN HFA)  108 (90 Base) MCG/ACT inhaler Inhale 2 puffs into the lungs every 6 (six) hours as needed for wheezing or shortness of breath. 06/28/14  Yes [provider]  albuterol (VENTOLIN HFA) 108 (90 Base) MCG/ACT inhaler Inhale 1-2 puffs into the lungs every 6 (six) hours as needed for wheezing or shortness of breath. 12/27/20  Yes Titiana Severa, Greggory Brandy, MD  allopurinol (ZYLOPRIM) 100 MG tablet Take 100 mg by mouth 2 (two) times daily. 08/02/18  Yes [provider]  aspirin EC 81 MG tablet Take 81 mg by mouth daily. Swallow whole.   Yes [provider]  atorvastatin (LIPITOR) 10 MG tablet Take 10 mg by mouth at bedtime.    Yes [provider]  budesonide-formoterol (SYMBICORT) 80-4.5 MCG/ACT inhaler Inhale 2 puffs into the lungs 2 (two) times daily.   Yes [provider]  chlorthalidone (HYGROTON) 50 MG tablet Take 50 mg by mouth daily.   Yes [provider]  FLUoxetine (PROZAC) 20 MG capsule Take 20 mg by mouth daily. 08/02/18  Yes [provider]  mometasone-formoterol (DULERA) 200-5 MCG/ACT AERO Inhale 2 puffs into the lungs at bedtime. 06/28/14  Yes [provider]  omeprazole (PRILOSEC) 20 MG capsule Take 20 mg by mouth daily.   Yes [provider]  predniSONE (DELTASONE) 10 MG tablet Take 2 tablets (20 mg total) by mouth 2 (two) times daily with a meal for 5 days. 12/27/20 01/01/21 Yes Luna Fuse, MD  HYDROcodone-acetaminophen (NORCO) 5-325 MG tablet Take 1 tablet by mouth every 4 (four) hours as needed for moderate pain. Patient not taking: Reported on 12/27/2020 11/20/18   Ceasar Mons, MD    Allergies    Bee venom  Review of Systems   Review of Systems  Constitutional: Negative for fever.  HENT: Negative for ear pain and sore throat.   Eyes: Negative for pain.  Respiratory: Positive for cough and shortness of breath.   Cardiovascular: Negative for chest pain.  Gastrointestinal: Negative for abdominal pain.  Genitourinary: Negative for flank pain.  Musculoskeletal: Negative for back pain.  Skin: Negative for color change and rash.  Neurological: Negative for syncope.  All other systems reviewed and are negative.   Physical Exam Updated Vital Signs BP (!) 147/89   Pulse 96   Temp 98.3 F (36.8 C) (Oral)   Resp (!) 21   Ht 5\' 10"  (1.778 m)   Wt 81.6 kg   SpO2 94%   BMI 25.83 kg/m   Physical Exam Constitutional:      General: He is not in acute distress.    Appearance: He is well-developed.  HENT:     Head: Normocephalic.     Nose: Nose normal.  Eyes:     Extraocular Movements: Extraocular movements intact.   Cardiovascular:     Rate and Rhythm: Normal rate.  Pulmonary:     Effort: Tachypnea present.     Breath sounds: Wheezing present.  Skin:    Coloration: Skin is not jaundiced.  Neurological:     Mental Status: He is alert. Mental status is at baseline.     ED Results / Procedures / Treatments   Labs (all labs ordered are listed, but only abnormal results are displayed) Labs Reviewed  CBC WITH DIFFERENTIAL/PLATELET - Abnormal; Notable for the following components:      Result Value   RBC 3.72 (*)    Hemoglobin 11.9 (*)    HCT 35.6 (*)    Lymphs Abs 4.7 (*)  All other components within normal limits  BASIC METABOLIC PANEL - Abnormal; Notable for the following components:   Glucose, Bld 114 (*)    All other components within normal limits  RESP PANEL BY RT-PCR (FLU A&B, COVID) ARPGX2  BRAIN NATRIURETIC PEPTIDE    EKG None  Radiology No results found.  Procedures .Critical Care Performed by: Luna Fuse, MD Authorized by: Luna Fuse, MD   Critical care provider statement:    Critical care time (minutes):  30   Critical care time was exclusive of:  Separately billable procedures and treating other patients and teaching time   Critical care was necessary to treat or prevent imminent or life-threatening deterioration of the following conditions:  Respiratory failure Comments:     3 breathing treatments required.     Medications Ordered in ED Medications  albuterol (PROVENTIL) (2.5 MG/3ML) 0.083% nebulizer solution 2.5 mg (has no administration in time range)  ipratropium-albuterol (DUONEB) 0.5-2.5 (3) MG/3ML nebulizer solution 3 mL (3 mLs Nebulization Given 12/27/20 1122)  albuterol (PROVENTIL) (2.5 MG/3ML) 0.083% nebulizer solution 2.5 mg (2.5 mg Nebulization Given 12/27/20 1122)    ED Course  I have reviewed the triage vital signs and the nursing notes.  Pertinent labs & imaging results that were available during my care of the patient were reviewed by  me and considered in my medical decision making (see chart for details).    MDM Rules/Calculators/A&P                          Patient has mild to moderate tachypnea on exam.  Otherwise 97% on room air which is appropriate.  Lung exam has diffuse wheezes.  Labs are sent and Covid panel sent as well.  Patient given Solu-Medrol and breathing treatments with improvement.  Vital signs remained stable, work of breathing appears improved, will be discharged home to follow-up with his primary care doctor in 3 to 4 days.  Advised immediate return for worsening symptoms difficulty breathing or any additional concerns.   Final Clinical Impression(s) / ED Diagnoses Final diagnoses:  COPD exacerbation ([place])    Rx / DC Orders ED Discharge Orders         Ordered    predniSONE (DELTASONE) 10 MG tablet  2 times daily with meals        12/27/20 1357    albuterol (VENTOLIN HFA) 108 (90 Base) MCG/ACT inhaler  Every 6 hours PRN        12/27/20 1357           Luna Fuse, MD 12/27/20 1357

## 2021-01-04 ENCOUNTER — Other Ambulatory Visit: Payer: Self-pay | Admitting: Family Medicine

## 2021-01-04 ENCOUNTER — Other Ambulatory Visit (HOSPITAL_COMMUNITY): Payer: Self-pay | Admitting: Family Medicine

## 2021-01-04 DIAGNOSIS — N289 Disorder of kidney and ureter, unspecified: Secondary | ICD-10-CM

## 2021-01-04 DIAGNOSIS — R911 Solitary pulmonary nodule: Secondary | ICD-10-CM

## 2021-01-06 DIAGNOSIS — N281 Cyst of kidney, acquired: Secondary | ICD-10-CM | POA: Diagnosis not present

## 2021-01-06 DIAGNOSIS — I700 Atherosclerosis of aorta: Secondary | ICD-10-CM | POA: Diagnosis not present

## 2021-01-06 DIAGNOSIS — M5136 Other intervertebral disc degeneration, lumbar region: Secondary | ICD-10-CM | POA: Diagnosis not present

## 2021-01-11 ENCOUNTER — Encounter (HOSPITAL_COMMUNITY): Payer: Self-pay | Admitting: *Deleted

## 2021-01-11 NOTE — Progress Notes (Signed)
Received [facility] authorization [id number] form Dr. Gwenette Greet at the [facility] for Pulmonary rehab with the diagnosis of  COPD unspecified. Clinical review of pt follow up appt on 3/1 Pulmonary office note. Also pt had ER Visit for COPD exacerbation here at [facility] was reviewed.  Pt with Covid Risk Score - 4. Pt appropriate for scheduling for Pulmonary rehab.  Will forward to support staff for scheduling.  Cherre Huger, BSN Cardiac and Training and development officer

## 2021-01-18 ENCOUNTER — Ambulatory Visit (HOSPITAL_COMMUNITY): Payer: Medicare Other

## 2021-01-18 ENCOUNTER — Ambulatory Visit (HOSPITAL_COMMUNITY)
Admission: RE | Admit: 2021-01-18 | Discharge: 2021-01-18 | Disposition: A | Discharge status: Home / Self Care | Payer: No Typology Code available for payment source | Source: Ambulatory Visit | Attending: Family Medicine | Admitting: Family Medicine

## 2021-01-18 ENCOUNTER — Other Ambulatory Visit: Payer: Self-pay

## 2021-01-18 DIAGNOSIS — N289 Disorder of kidney and ureter, unspecified: Secondary | ICD-10-CM | POA: Insufficient documentation

## 2021-01-18 DIAGNOSIS — R911 Solitary pulmonary nodule: Secondary | ICD-10-CM | POA: Diagnosis present

## 2021-01-18 LAB — GLUCOSE, CAPILLARY: Glucose-Capillary: 109 mg/dL — ABNORMAL HIGH (ref 70–99)

## 2021-01-18 IMAGING — CT NM PET TUM IMG INITIAL (PI) SKULL BASE T - THIGH
8 series · 25 of 25 positions shown · non-contrast
Comparison: CT abdomen [DATE]

CLINICAL DATA: Initial treatment strategy for renal mass with
pulmonary nodules.

EXAM:
NUCLEAR MEDICINE PET SKULL BASE TO THIGH
TECHNIQUE: 9.0 mCi F-18 FDG was injected intravenously. Full-ring PET imaging
was performed from the skull base to thigh after the radiotracer. CT
data was obtained and used for attenuation correction and anatomic
localization.
Fasting blood glucose: 109 mg/dl

[Series 3: pet sk_thigh ac · axial · 5.0mm · 4.07mm/px · z∈[-1323,-435]mm · 5 of 223 slices shown]
[im 1/223]
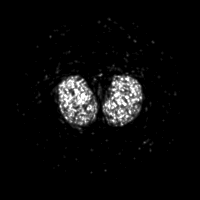
[im 56/223]
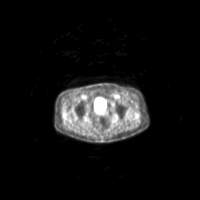
[im 112/223]
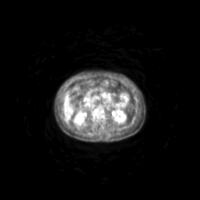
[im 167/223]
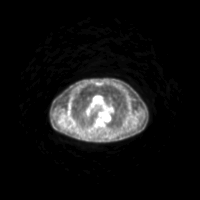
[im 223/223]
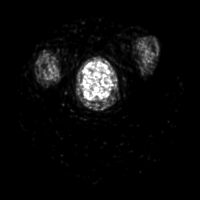

[Series 4: ct sk_thigh 5.0 bf37 · axial · 5.0mm · 0.98mm/px · z∈[-1323,-435]mm · 5 of 223 slices shown]
[im 1/223]
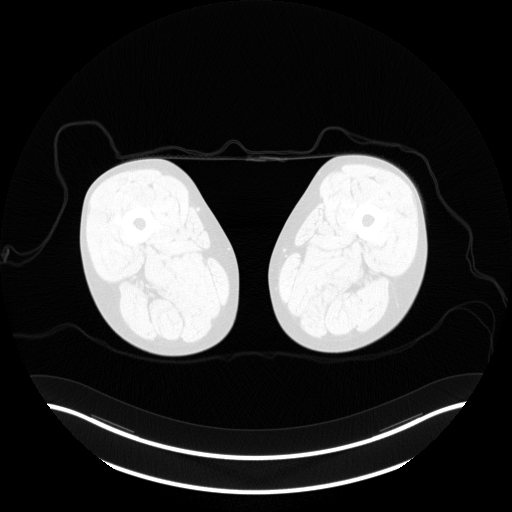
[im 56/223]
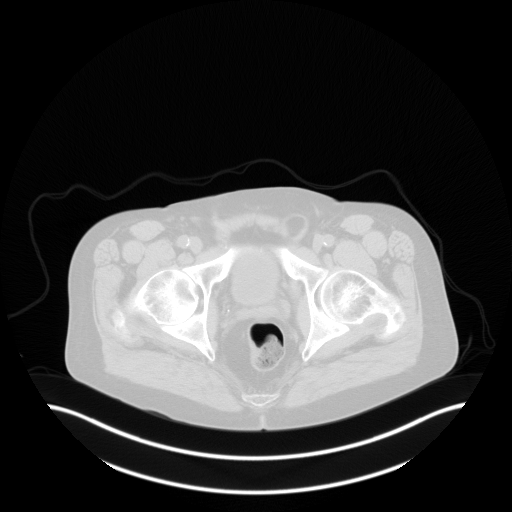
[im 112/223]
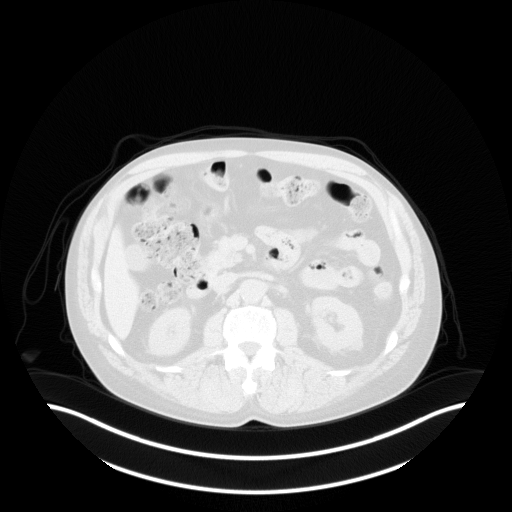
[im 167/223]
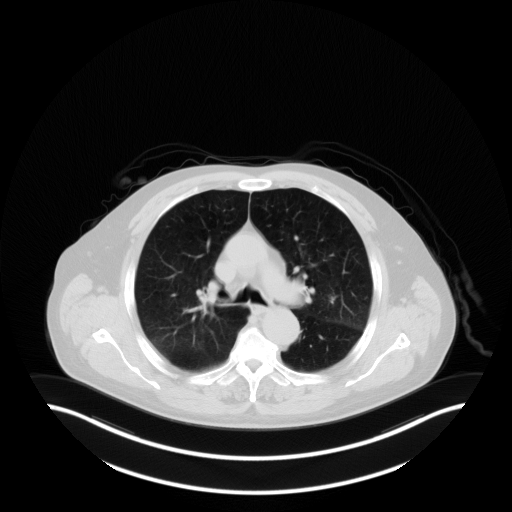
[im 223/223  brain]
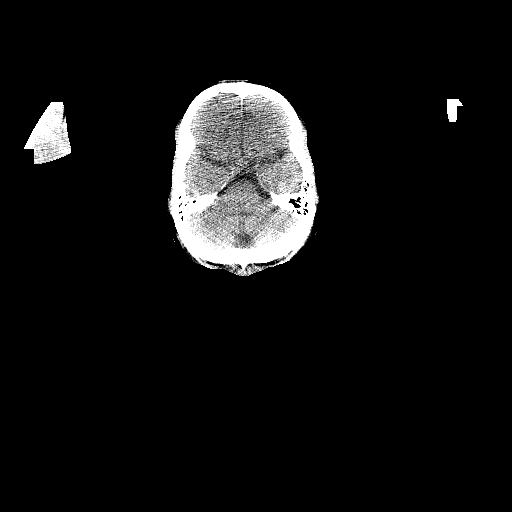

[Series 5: pet sk_thigh nac · axial · 5.0mm · 4.07mm/px · z∈[-1323,-435]mm · 5 of 223 slices shown]
[im 1/223]
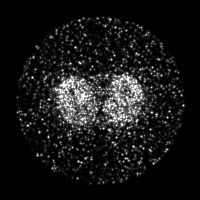
[im 56/223]
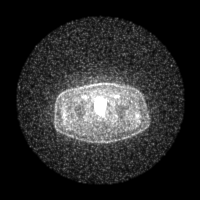
[im 112/223]
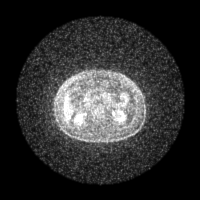
[im 167/223]
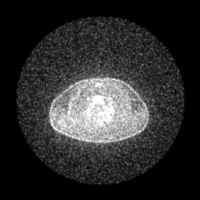
[im 223/223]
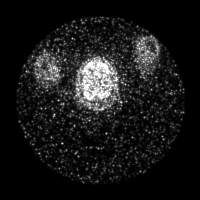

[Series 8: ct sk_thigh 5.0 br59 lung_bone · axial · 5.0mm · 0.70mm/px · z∈[-829,-565]mm · 2 of 67 slices shown]
[im 1/67]
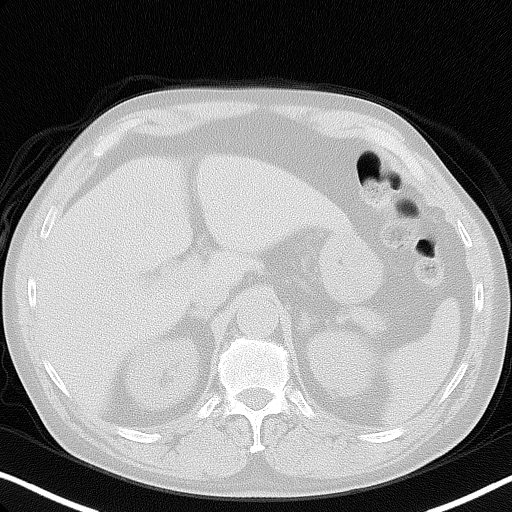
[im 67/67]
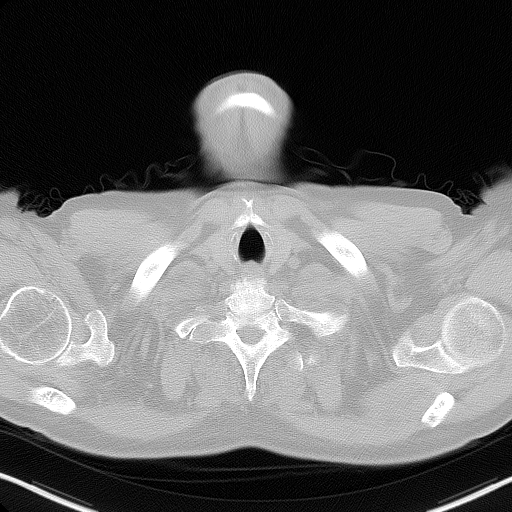

[Series 603: fused cor · 1 of 55 slices shown]
[im 1/55]
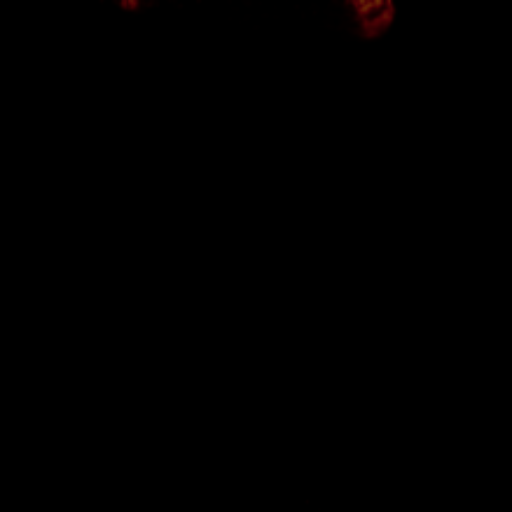

[Series 604: <mip collection> · coronal · 1.84mm/px · 1 of 32 slices shown]
[im 1/32]
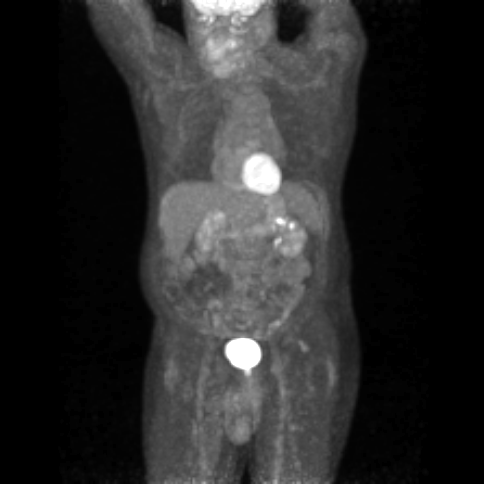

[Series 605: range-ct sk_thigh 5.0 bf37-tra-<alpha range> · 5 of 214 slices shown]
[im 1/214]
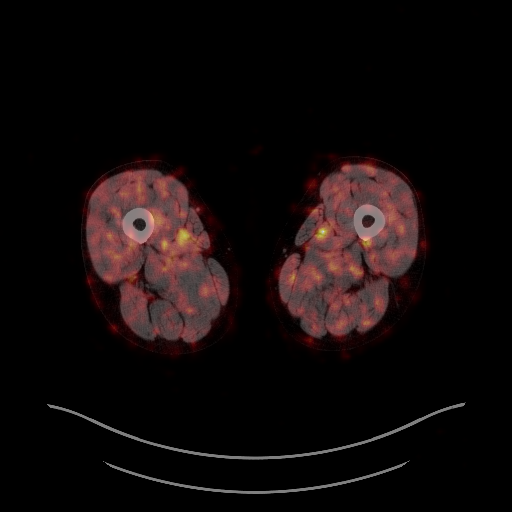
[im 54/214]
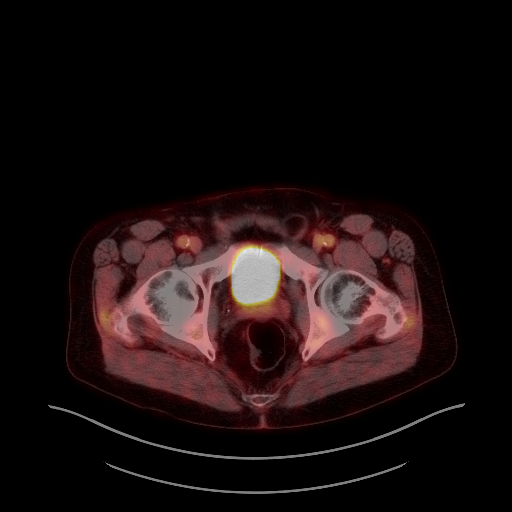
[im 107/214]
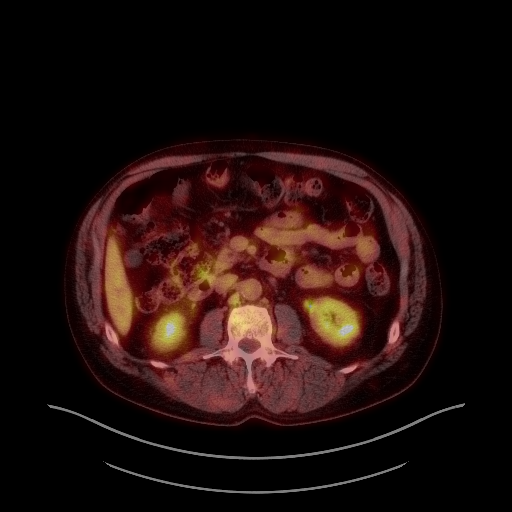
[im 160/214]
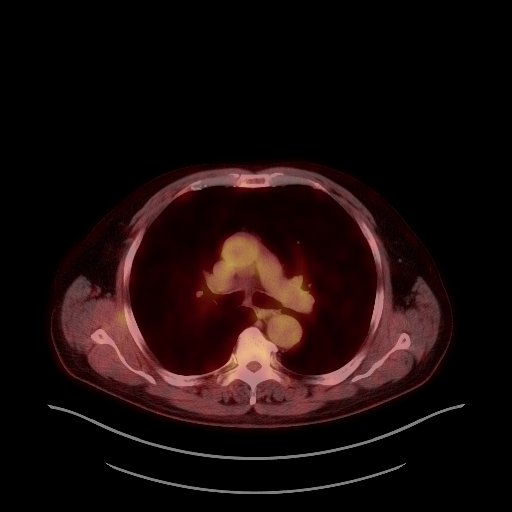
[im 214/214]
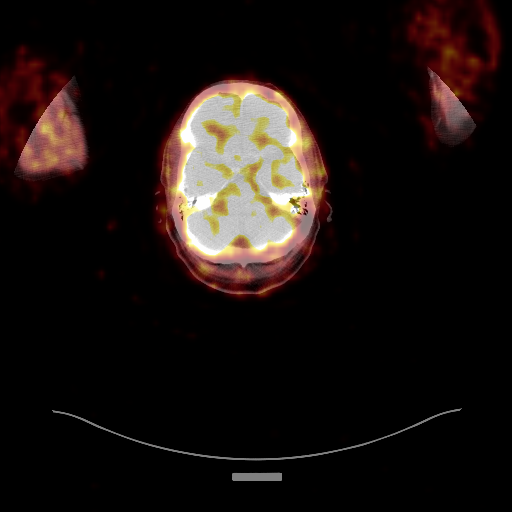

[Series 1035: results mm oncology reading · 5.0mm · 0.74mm/px · 1 of 2 slices shown]
[im 1/2]
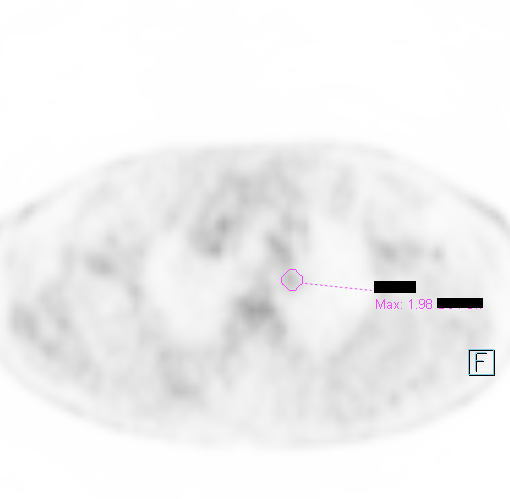

[25 of 25 positions shown; findings below may reference images not displayed]

FINDINGS: Mediastinal blood pool activity: SUV max

Liver activity: SUV max NA

NECK: Accentuated activity along the tongue slightly eccentric to
the right without associated CT abnormality, probably
physiologic/incidental.

Incidental CT findings: Chronic right ethmoid sinusitis.

CHEST: 11 by 7 by 8 mm (volume = 300 mm^3) left upper lobe
pleural-based nodule medially on image 9 series 8, maximum SUV

Incidental CT findings: Centrilobular emphysema. Coronary, aortic
arch, and branch vessel atherosclerotic vascular disease.

ABDOMEN/PELVIS: Photopenic appearance of previously identified
Bosniak category 1 and category 2 cysts. Some of the tiny cystic
lesions are too small to characterize. No specific worrisome renal
activity is identified although please note that background activity
in the kidneys is often high which can reduce PET sensitivity in
assessing the kidneys for neoplasm compared to other solid abdominal
organs. No hypermetabolic adenopathy.

Incidental CT findings: Aortoiliac atherosclerotic vascular disease.
Sigmoid colon diverticulosis. Anterior superior diverticulum of the
urinary bladder, urachal diverticulum not excluded.

SKELETON: No significant abnormal hypermetabolic activity in this
region.

Incidental CT findings: Spondylosis and degenerative disc disease at
L4-5 and L5-S1.
IMPRESSION: 1. Photopenic Bosniak category 1 and category 2 benign cysts. Some
of the small renal lesions are too small to characterize.
2. 11 by 7 by 8 mm left upper lobe pleural-based nodule medially has
a maximum SUV of 2.0, possibilities include benign process or
low-grade adenocarcinoma. Location along the upper margin of the
aortic arch may make biopsy tricky. I would suggest close chest CT
surveillance with follow up diagnostic chest CT in 3-6 months time
to establish a full diagnostic CT baseline and compare to today's
examination.
3. Other imaging findings of potential clinical significance: Aortic
Atherosclerosis ([LV]-[LV]) and Emphysema ([LV]-[LV]). Coronary
atherosclerosis. Chronic right ethmoid sinusitis. Sigmoid colon
diverticulosis. Spondylosis and degenerative disc disease at L4-5
and L5-S1.

## 2021-01-18 MED ORDER — FLUDEOXYGLUCOSE F - 18 (FDG) INJECTION
8.6000 | Freq: Once | INTRAVENOUS | Status: AC | PRN
  Administered 2021-01-18: 9 via INTRAVENOUS

## 2021-01-20 DIAGNOSIS — K469 Unspecified abdominal hernia without obstruction or gangrene: Secondary | ICD-10-CM | POA: Diagnosis not present

## 2021-01-27 ENCOUNTER — Telehealth (HOSPITAL_COMMUNITY): Payer: Self-pay

## 2021-01-27 NOTE — Telephone Encounter (Signed)
Called and spoke with pt in regards to PR, pt stated he is not interested at this time.  Closed referral

## 2021-01-31 LAB — CBC WITH AUTO DIFFERENTIAL
Absolute Baso #: 0 10*3/uL (ref 0.0–0.2)
Absolute Eos #: 0.2 10*3/uL (ref 0.0–0.5)
Absolute Lymph #: 1.3 10*3/uL (ref 1.0–3.2)
Absolute Mono #: 0.6 10*3/uL (ref 0.3–1.0)
Basophils %: 0.6 % (ref 0.0–2.0)
Eosinophils %: 3 % (ref 0.0–7.0)
Hematocrit: 44.2 % (ref 38.0–52.0)
Hemoglobin: 14.6 g/dL (ref 13.0–17.3)
Immature Grans (Abs): 0.02 10*3/uL (ref 0.00–0.06)
Immature Granulocytes: 0.4 % (ref 0.0–0.6)
Lymphocytes: 26.6 % (ref 15.0–45.0)
MCH: 32.5 pg (ref 27.0–34.5)
MCHC: 33 g/dL (ref 32.0–36.0)
MCV: 98.4 fL (ref 84.0–100.0)
MPV: 10.1 fL (ref 7.2–13.2)
Monocytes: 12 % (ref 4.0–12.0)
Neutrophils %: 57.4 % (ref 42.0–74.0)
Neutrophils Absolute: 2.8 10*3/uL (ref 1.6–7.3)
Platelets: 208 10*3/uL (ref 140–440)
RBC: 4.49 x10e6/mcL (ref 4.00–5.60)
RDW: 12.5 % (ref 11.0–16.0)
WBC: 4.9 10*3/uL (ref 3.8–10.6)

## 2021-01-31 LAB — COMPREHENSIVE METABOLIC PANEL
ALT: 18 U/L (ref 0–41)
AST: 18 U/L (ref 0–40)
Albumin/Globulin Ratio: 2 mmol/L (ref 1.00–2.70)
Albumin: 4.4 g/dL (ref 3.5–5.2)
Alk Phosphatase: 92 U/L (ref 40–130)
Anion Gap: 13 mmol/L (ref 2–17)
BUN: 19 mg/dL (ref 8–23)
CO2: 24 mmol/L (ref 22–29)
Calcium: 8.9 mg/dL (ref 8.8–10.2)
Chloride: 103 mmol/L (ref 98–107)
Creatinine: 0.8 mg/dL (ref 0.7–1.3)
GFR African American: 106 mL/min/{1.73_m2} (ref >=90)
GFR Non-African American: 91 mL/min/{1.73_m2} (ref >=90)
Globulin: 3 g/dL (ref 1.9–4.4)
Glucose: 98 mg/dL (ref 70–99)
OSMOLALITY CALCULATED: 282 mOsm/kg (ref 270–287)
Potassium: 3.9 mmol/L (ref 3.5–5.3)
Sodium: 140 mmol/L (ref 135–145)
Total Bilirubin: 0.46 mg/dL (ref 0.00–1.20)
Total Protein: 7.1 g/dL (ref 6.4–8.3)

## 2021-01-31 LAB — ADD ON LAB TEST

## 2021-01-31 LAB — MAGNESIUM: Magnesium: 2.2 mg/dL (ref 1.6–2.6)

## 2021-01-31 LAB — TROPONIN T: Troponin T: <0.01 ng/mL (ref 0.000–0.010)

## 2021-01-31 LAB — COVID-19: SARS-CoV-2: NOT DETECTED

## 2021-01-31 LAB — N TERMINAL PROBNP (AKA NTPROBNP): NT Pro-BNP: 121 pg/mL (ref 0–125)

## 2021-01-31 NOTE — ED Notes (Signed)
ED Triage Note       ED Secondary Triage Entered On:  01/31/2021 16:29 EDT    Performed On:  01/31/2021 16:28 EDT by Corena Herter C-RN               General Information   Barriers to Learning :   Acuity of Illness   ED Home Meds Section :   Document assessment   [facility] ED Fall Risk Section :   Document assessment   ED Advance Directives Section :   Document assessment   ED Palliative Screen :   Document assessment   Corena Herter C-RN - 01/31/2021 16:28 EDT   (As Of: 01/31/2021 16:29:01 EDT)   Problems(Active)    BPH (benign prostatic hyperplasia) (SNOMED CT  :387564332 )  Name of Problem:   BPH (benign prostatic hyperplasia) ; Recorder:   TURNER, RN, LYNN R; Confirmation:   Confirmed ; Classification:   Patient Stated ; Code:   [id number] ; Contributor System:   Dietitian ; Last Updated:   02/08/2017 14:41 EDT ; Life Cycle Date:   02/08/2017 ; Life Cycle Status:   Active ; Vocabulary:   SNOMED CT        Dental crowns status (SNOMED CT  :[id number] )  Name of Problem:   Dental crowns status ; Recorder:   TURNER, RN, LYNN R; Confirmation:   Confirmed ; Classification:   Patient Stated ; Code:   [id number] ; Contributor System:   Dietitian ; Last Updated:   02/08/2017 14:42 EDT ; Life Cycle Date:   02/08/2017 ; Life Cycle Status:   Active ; Vocabulary:   SNOMED CT        Gastritis (SNOMED CT  :3818299 )  Name of Problem:   Gastritis ; Recorder:   Steele Sizer RN, Cipriano Mile; Confirmation:   Confirmed ; Classification:   Patient Stated ; Code:   3716967 ; Contributor System:   PowerChart ; Last Updated:   09/07/2020 11:18 EDT ; Life Cycle Date:   09/07/2020 ; Life Cycle Status:   Active ; Vocabulary:   SNOMED CT        H/O: HTN (hypertension) (SNOMED CT  :893810175 )  Name of Problem:   H/O: HTN (hypertension) ; Recorder:   TURNER, RN, LYNN R; Confirmation:   Confirmed ; Classification:   Patient Stated ; Code:   102585277 ; Contributor System:   PowerChart ; Last Updated:   02/08/2017  14:40 EDT ; Life Cycle Date:   02/08/2017 ; Life Cycle Status:   Active ; Vocabulary:   SNOMED CT        High cholesterol (SNOMED CT  :82423536 )  Name of Problem:   High cholesterol ; Recorder:   TURNER, RN, LYNN R; Confirmation:   Confirmed ; Classification:   Patient Stated ; Code:   14431540 ; Contributor System:   Dietitian ; Last Updated:   02/08/2017 14:39 EDT ; Life Cycle Date:   02/08/2017 ; Life Cycle Status:   Active ; Vocabulary:   SNOMED CT        Hypothyroid (SNOMED CT  :08676195 )  Name of Problem:   Hypothyroid ; Recorder:   TURNER, RN, LYNN R; Confirmation:   Confirmed ; Classification:   Patient Stated ; Code:   09326712 ; Contributor System:   Dietitian ; Last Updated:   02/08/2017 14:39 EDT ; Life Cycle Date:   02/08/2017 ; Life Cycle Status:   Active ; Vocabulary:   SNOMED CT  Syncope (SNOMED CT  :[id number] )  Name of Problem:   Syncope ; Recorder:   Steele SizerKOMINE, RN, Cipriano MileGAIL K; Confirmation:   Confirmed ; Classification:   Patient Stated ; Code:   [id number] ; Contributor System:   DietitianowerChart ; Last Updated:   09/07/2020 11:18 EDT ; Life Cycle Date:   09/07/2020 ; Life Cycle Status:   Active ; Vocabulary:   SNOMED CT        Tongue cancer (SNOMED CT  :782956213482547010 )  Name of Problem:   Tongue cancer ; Onset Date:   02/04/2017 ; Recorder:   Selena Battenody, RN, August AlbinoEleanor F; Confirmation:   Confirmed ; Classification:   Patient Stated ; Code:   [id number] ; Contributor System:   PowerChart ; Last Updated:   03/15/2017 9:46 EDT ; Life Cycle Date:   03/15/2017 ; Life Cycle Status:   Active ; Vocabulary:   SNOMED CT   ; Comments:        03/15/2017 9:46 - Selena Battenody, RN, August AlbinoEleanor F  had first surgery in Apr'18-now for Left Cervical Lymphadenectomy on 03/20/17      Vasovagal episode (SNOMED CT  :[id number] )  Name of Problem:   Vasovagal episode ; Recorder:   Steele SizerKOMINE, RN, Cipriano MileGAIL K; Confirmation:   Confirmed ; Classification:   Patient Stated ; Code:   [id number] ; Contributor System:   PowerChart ; Last Updated:   09/07/2020 11:19 EDT ; Life Cycle  Date:   09/07/2020 ; Life Cycle Status:   Active ; Vocabulary:   SNOMED CT          Diagnoses(Active)    Palpitations  Date:   01/31/2021 ; Diagnosis Type:   Reason For Visit ; Confirmation:   Complaint of ; Clinical Dx:   Palpitations ; Classification:   Medical ; Clinical Service:   Emergency medicine ; Code:   PNED ; Probability:   0 ; Diagnosis Code:   [id number]             -    Procedure History   (As Of: 01/31/2021 16:29:01 EDT)     Procedure Dt/Tm:   02/13/2017 12:57:00 EDT ; Location:   [facility] ; Provider:   Janalyn RouseBEHRENS-MD,  EDWARD; Anesthesia Type:   General ; Anesthesia Minutes:   0 ; Procedure Name:   Glossectomy (Partial) ; Procedure Minutes:   6483 ; Comments:     02/13/2017 14:32 EDT - Terie PurserIsear, RN, Vicente SereneGabriel  auto-populated from documented surgical case ; Clinical Service:   Surgery            Procedure Dt/Tm:   02/13/2017 12:57:00 EDT ; Location:   [facility] ; Provider:   Janalyn RouseBEHRENS-MD,  EDWARD; Anesthesia Type:   General ; Anesthesia Minutes:   0 ; Procedure Name:   Skin Graft Split Thickness ; Procedure Minutes:   83 ; Comments:     02/13/2017 14:32 EDT - Terie PurserIsear, RN, Vicente SereneGabriel  auto-populated from documented surgical case ; Clinical Service:   Surgery            Anesthesia Minutes:   0 ; Procedure Name:   Wisdom tooth ; Procedure Minutes:   0            Anesthesia Minutes:   0 ; Procedure Name:   Colonoscopy ; Procedure Minutes:   0            Procedure Dt/Tm:   03/20/2017 13:08:00 EDT ; Location:   [facility] ; Provider:   Janalyn RouseBEHRENS-MD,  EDWARD; Anesthesia Type:  General ; :   SLATTERY-MD,  Aldine Contes; Anesthesia Minutes:   0 ; Procedure Name:   Neck Dissection (Left, Cervical) ; Procedure Minutes:   136 ; Comments:     03/20/2017 15:37 EDT - Lorelle Formosa, RN, Dairl Ponder  auto-populated from documented surgical case ; Clinical Service:   Surgery            Anesthesia Minutes:   0 ; Procedure Name:   Dupuytren contracture of right palm ; Procedure Minutes:   0            Procedure Dt/Tm:   08/27/2019 ; Anesthesia  Minutes:   0 ; Procedure Name:   Aortic valve replacement and replacement of ascending aorta ; Procedure Minutes:   0            Procedure Dt/Tm:   09/10/2019 ; Anesthesia Minutes:   0 ; Procedure Name:   Pacemaker catheter ; Procedure Minutes:   0            [facility] Fall Risk Assessment Tool   Hx of falling last 3 months ED Fall :   No   Corena Herter C-RN - 01/31/2021 16:28 EDT   ED Advance Directive   Advance Directive :   Yes   Corena Herter C-RN - 01/31/2021 16:28 EDT   Palliative Care   Does the Patient have a Life Limiting Illness :   None of the above   Corena Herter C-RN - 01/31/2021 16:28 EDT   Med Hx   Medication List   (As Of: 01/31/2021 16:29:01 EDT)   Normal Order    amiodarone 360 mg + Premix D5W 200 mL  :   amiodarone 360 mg + Premix D5W 200 mL ; Status:   Ordered ; Ordered As Mnemonic:   amiodarone IV additive 360 mg + Premix D5W 200 mL ; Simple Display Line:   TITRATE, IV ; Ordering Provider:   Howie Ill M-MD; Catalog Code:   Premix D5W ; Order Dt/Tm:   01/31/2021 16:18:04 EDT ; Comment:   Infuse @ 1mg /min x 6hrs then decrease dose to 0.5mg /min; change solution, line and filter every 24hrs; Max dose 2.2grams/24hrs          aspirin 81 mg Chew Tab  :   aspirin 81 mg Chew Tab ; Status:   Ordered ; Ordered As Mnemonic:   aspirin ; Simple Display Line:   324 mg, 4 tabs, Chewed, Once ; Ordering Provider:   M-MD; Catalog Code:   aspirin ; Order Dt/Tm:   01/31/2021 16:16:38 EDT          amiodarone  :   amiodarone ; Status:   Completed ; Ordered As Mnemonic:   amiodarone IVPB Bolus ; Simple Display Line:   150 mg, 100 mL, 600 mL/hr, IV Piggyback, Once ; Ordering Provider:   02/02/2021 M-MD; Catalog Code:   amiodarone ; Order Dt/Tm:   01/31/2021 16:09:15 EDT          calcium gluconate premix  :   calcium gluconate premix ; Status:   Completed ; Ordered As Mnemonic:   calcium gluconate ; Simple Display Line:   1 g, 50 mL, 100 mL/hr, IV Piggyback, Once ; Ordering Provider:   02/02/2021 M-MD;  Catalog Code:   calcium gluconate ; Order Dt/Tm:   01/31/2021 16:08:44 EDT          magnesium sulfate premix  :   magnesium sulfate  premix ; Status:   Completed ; Ordered As Mnemonic:   magnesium sulfate 1g IVPB ; Simple Display Line:   1 g, 100 mL, 100 mL/hr, IV Piggyback, Once ; Ordering Provider:   Howie Ill M-MD; Catalog Code:   magnesium sulfate ; Order Dt/Tm:   01/31/2021 16:08:44 EDT            Prescription/Discharge Order    fludrocortisone  :   fludrocortisone ; Status:   Prescribed ; Ordered As Mnemonic:   fludrocortisone 0.1 mg oral tablet ; Simple Display Line:   0.1 mg, 1 tabs, Oral, Daily, 90 caps, 3 Refill(s) ; Ordering Provider:   Levy Pupa B-PA-C; Catalog Code:   fludrocortisone ; Order Dt/Tm:   10/15/2020 16:57:23 EST          metoprolol  :   metoprolol ; Status:   Prescribed ; Ordered As Mnemonic:   Toprol-XL 25 mg oral tablet, extended release ; Simple Display Line:   25 mg, 1 tabs, Oral, Daily, 90 tabs, 3 Refill(s) ; Ordering Provider:   Levy Pupa B-PA-C; Catalog Code:   metoprolol ; Order Dt/Tm:   10/15/2020 16:57:34 EST          midodrine  :   midodrine ; Status:   Prescribed ; Ordered As Mnemonic:   midodrine 5 mg oral tablet ; Simple Display Line:   5 mg, 1 tabs, Oral, TID, 90 tabs, 3 Refill(s) ; Ordering Provider:   Levy Pupa B-PA-C; Catalog Code:   midodrine ; Order Dt/Tm:   10/15/2020 16:57:33 EST            Home Meds    aspirin  :   aspirin ; Status:   Documented ; Ordered As Mnemonic:   aspirin 81 mg oral delayed release tablet ; Simple Display Line:   81 mg, 1 tabs, Oral, Daily, 0 Refill(s) ; Ordering Provider:   Levy Pupa B-PA-C; Catalog Code:   aspirin ; Order Dt/Tm:   10/15/2020 16:57:50 EST          levothyroxine  :   levothyroxine ; Status:   Documented ; Ordered As Mnemonic:   levothyroxine 175 mcg (0.175 mg) oral tablet ; Simple Display Line:   175 mcg, 1 tabs, Oral, Daily, 0 Refill(s) ; Catalog Code:   levothyroxine ; Order Dt/Tm:   10/11/2020 19:50:31  EST          venlafaxine  :   venlafaxine ; Status:   Documented ; Ordered As Mnemonic:   venlafaxine 75 mg oral capsule, extended release ; Simple Display Line:   75 mg, 1 caps, Oral, qPM, 30 caps, 0 Refill(s) ; Ordering Provider:   Maylon Peppers; Catalog Code:   venlafaxine ; Order Dt/Tm:   02/08/2017 14:37:22 EDT ; Comment:   DO NOT CRUSH

## 2021-01-31 NOTE — ED Notes (Signed)
ED Triage Note       ED Triage Adult Entered On:  01/31/2021 16:28 EDT    Performed On:  01/31/2021 15:59 EDT by Corena Herter C-RN               Triage   Numeric Rating Pain Scale :   0 = No pain   Chief Complaint :   AICD went off several times today.  Has felt palpitations all day and near syncopal episodes.  Denies CP/SOB/nausea.     Chief Complaint Onset :   01/31/2021 15:59 EDT   Virgina Jock Mode of Arrival :   Private vehicle   Infectious Disease Documentation :   Document assessment   Patient presentation :   None of the above   Chief Complaint or Presentation suggest infection :   No   Dosing Weight Obtained By :   Patient stated   Weight Dosing :   93 kg(Converted to: 205 lb 0 oz)    Height :   183 cm(Converted to: 6 ft 0 in)    Body Mass Index Dosing :   28 kg/m2   Corena Herter C-RN - 01/31/2021 15:59 EDT   DCP GENERIC CODE   Tracking Acuity :   2   Tracking Group :   ED [facility]   Corena Herter C-RN - 01/31/2021 15:59 EDT   ED General Section :   Document assessment   Pregnancy Status :   N/A   ED Allergies Section :   Document assessment   ED Reason for Visit Section :   Document assessment   Corena Herter C-RN - 01/31/2021 15:59 EDT   ID Risk Screen Symptoms   Recent Travel History :   No recent travel   Close Contact with COVID-19 ID :   No   Last 14 days COVID-19 ID :   No   TB Symptom Screen :   No symptoms   C. diff Symptom/History ID :   Neither of the above   Patient Pregnant :   None of the above   Corena Herter C-RN - 01/31/2021 15:59 EDT   Allergies   (As Of: 01/31/2021 16:28:15 EDT)   Allergies (Active)   No Known Allergies  Estimated Onset Date:   Unspecified ; Created By:   Eric Form D; Reaction Status:   Active ; Category:   Drug ; Substance:   No Known Allergies ; Type:   Allergy ; Updated By:   Eric Form D; Source:   Paper Chart/Abstracting ; Reviewed Date:   01/31/2021 16:26 EDT        Psycho-Social   Last 3 mo, thoughts killing self/others :   Patient denies   Right click  within box for Suspected Abuse policy link. :   None   Feels Safe Where Live :   Yes   Corena Herter C-RN - 01/31/2021 15:59 EDT   ED Reason for Visit   (As Of: 01/31/2021 16:28:15 EDT)   Problems(Active)    BPH (benign prostatic hyperplasia) (SNOMED CT  :295621308 )  Name of Problem:   BPH (benign prostatic hyperplasia) ; Recorder:   TURNER, RN, LYNN R; Confirmation:   Confirmed ; Classification:   Patient Stated ; Code:   [id number] ; Contributor System:   Dietitian ; Last Updated:   02/08/2017 14:41 EDT ; Life Cycle Date:   02/08/2017 ; Life Cycle Status:   Active ; Vocabulary:  SNOMED CT        Dental crowns status (SNOMED CT  :[id number] )  Name of Problem:   Dental crowns status ; Recorder:   TURNER, RN, LYNN R; Confirmation:   Confirmed ; Classification:   Patient Stated ; Code:   [id number] ; Contributor System:   Dietitian ; Last Updated:   02/08/2017 14:42 EDT ; Life Cycle Date:   02/08/2017 ; Life Cycle Status:   Active ; Vocabulary:   SNOMED CT        Gastritis (SNOMED CT  :5732202 )  Name of Problem:   Gastritis ; Recorder:   Steele Sizer RN, Cipriano Mile; Confirmation:   Confirmed ; Classification:   Patient Stated ; Code:   5427062 ; Contributor System:   PowerChart ; Last Updated:   09/07/2020 11:18 EDT ; Life Cycle Date:   09/07/2020 ; Life Cycle Status:   Active ; Vocabulary:   SNOMED CT        H/O: HTN (hypertension) (SNOMED CT  :376283151 )  Name of Problem:   H/O: HTN (hypertension) ; Recorder:   TURNER, RN, LYNN R; Confirmation:   Confirmed ; Classification:   Patient Stated ; Code:   761607371 ; Contributor System:   PowerChart ; Last Updated:   02/08/2017 14:40 EDT ; Life Cycle Date:   02/08/2017 ; Life Cycle Status:   Active ; Vocabulary:   SNOMED CT        High cholesterol (SNOMED CT  :06269485 )  Name of Problem:   High cholesterol ; Recorder:   TURNER, RN, LYNN R; Confirmation:   Confirmed ; Classification:   Patient Stated ; Code:   46270350 ; Contributor  System:   Dietitian ; Last Updated:   02/08/2017 14:39 EDT ; Life Cycle Date:   02/08/2017 ; Life Cycle Status:   Active ; Vocabulary:   SNOMED CT        Hypothyroid (SNOMED CT  :09381829 )  Name of Problem:   Hypothyroid ; Recorder:   TURNER, RN, LYNN R; Confirmation:   Confirmed ; Classification:   Patient Stated ; Code:   93716967 ; Contributor System:   Dietitian ; Last Updated:   02/08/2017 14:39 EDT ; Life Cycle Date:   02/08/2017 ; Life Cycle Status:   Active ; Vocabulary:   SNOMED CT        Syncope (SNOMED CT  :893810175 )  Name of Problem:   Syncope ; Recorder:   Steele Sizer RN, Cipriano Mile; Confirmation:   Confirmed ; Classification:   Patient Stated ; Code:   102585277 ; Contributor System:   Dietitian ; Last Updated:   09/07/2020 11:18 EDT ; Life Cycle Date:   09/07/2020 ; Life Cycle Status:   Active ; Vocabulary:   SNOMED CT        Tongue cancer (SNOMED CT  :824235361 )  Name of Problem:   Tongue cancer ; Onset Date:   02/04/2017 ; Recorder:   Selena Batten RN, August Albino; Confirmation:   Confirmed ; Classification:   Patient Stated ; Code:   443154008 ; Contributor System:   PowerChart ; Last Updated:   03/15/2017 9:46 EDT ; Life Cycle Date:   03/15/2017 ; Life Cycle Status:   Active ; Vocabulary:   SNOMED CT   ; Comments:        03/15/2017 9:46 - Selena Batten, RN, August Albino  had first surgery in Apr'18-now for Left Cervical Lymphadenectomy on 03/20/17      Vasovagal episode (SNOMED CT  :  7322025427 )  Name of Problem:   Vasovagal episode ; Recorder:   Steele Sizer, RN, Cipriano Mile; Confirmation:   Confirmed ; Classification:   Patient Stated ; Code:   [id number] ; Contributor System:   PowerChart ; Last Updated:   09/07/2020 11:19 EDT ; Life Cycle Date:   09/07/2020 ; Life Cycle Status:   Active ; Vocabulary:   SNOMED CT          Diagnoses(Active)    Palpitations  Date:   01/31/2021 ; Diagnosis Type:   Reason For Visit ; Confirmation:   Complaint of ; Clinical Dx:   Palpitations ; Classification:   Medical ; Clinical Service:   Emergency medicine ; Code:    PNED ; Probability:   0 ; Diagnosis Code:   [id number]

## 2021-01-31 NOTE — Discharge Summary (Signed)
ED Clinical Summary                     [facility]  [address] [place], [place], [address]  [phone]          PERSON INFORMATION  Name: BARDIA, WANGERIN Age:  70 Years DOB: [birth year]   Sex: Male Language: English PCP: Joya San L-DO   Marital Status: Married Phone: [phone] Med Service: MED-Medicine   MRN: [id number] Acct# [id number] Arrival: 01/31/2021 15:55:00   Visit Reason: Palpitations; A FIB Acuity: 2 LOS: 000 02:35   Address:    [address] [place] [facility] [address]   Diagnosis:    ICD (implantable cardioverter-defibrillator) discharge; Ventricular tachycardia  Medications:          Medications that have not changed  Other Medications  aspirin (aspirin 81 mg oral delayed release tablet) 1 Tabs Oral (given by mouth) every day.  Last Dose:____________________  fludrocortisone (fludrocortisone 0.1 mg oral tablet) 1 Tabs Oral (given by mouth) every day. Refills: 3.  Last Dose:____________________  levothyroxine (levothyroxine 175 mcg (0.175 mg) oral tablet) 1 Tabs Oral (given by mouth) every day.  Last Dose:____________________  metoprolol (Toprol-XL 25 mg oral tablet, extended release) 1 Tabs Oral (given by mouth) every day. Refills: 3.  Last Dose:____________________  midodrine (midodrine 5 mg oral tablet) 1 Tabs Oral (given by mouth) 3 times a day. Refills: 3.  Last Dose:____________________  venlafaxine (venlafaxine 75 mg oral capsule, extended release) 1 Capsules Oral (given by mouth) once a day (in the evening)., DO NOT CRUSH  Last Dose:____________________      Medications Administered During Visit:                Medication Dose Route   calcium gluconate 1 g IV Piggyback   magnesium sulfate 1 g IV Piggyback   amiodarone 150 mg IV Piggyback   aspirin 324 mg Chewed   Premix D5W 200 mL Initial Volume  33.33 mL/hr IV  Forearm, Mid Left   amiodarone 200 mL Initial Volume  33.33 mL/hr IV  Forearm, Mid Left   metoprolol 5 mg IV Push                Allergies      No Known Allergies      Major Tests and Procedures:  The following procedures and tests were performed during your ED visit.  COMMON PROCEDURES%>  COMMON PROCEDURES COMMENTS%>                PROVIDER INFORMATION               Provider Role Assigned Ivor Costa M-MD ED Provider 01/31/2021 15:57:43    Rohs, Raynelle Fanning C-RN ED Nurse 01/31/2021 17:05:41        Attending Physician:  Howie Ill M-MD      Admit Doc  POPE,  SABRINA M-MD     Consulting Doc  TRAN,  MINH-DO     VITALS INFORMATION  Vital Sign Triage Latest   Temp Oral ORAL_1%> ORAL%>   Temp Temporal TEMPORAL_1%> TEMPORAL%>   Temp Intravascular INTRAVASCULAR_1%> INTRAVASCULAR%>   Temp Axillary AXILLARY_1%> AXILLARY%>   Temp Rectal RECTAL_1%> RECTAL%>   02 Sat 97 % 96 %   Respiratory Rate RATE_1%> RATE%>   Peripheral Pulse Rate PULSE RATE_1%>60 bpm PULSE RATE%>   Apical Heart Rate HEART RATE_1%> HEART RATE%>   Blood Pressure BLOOD PRESSURE_1%>/ BLOOD PRESSURE_1%>81 mmHg  BLOOD PRESSURE%> / BLOOD PRESSURE%>81 mmHg                 Immunizations      No Immunizations Documented This Visit          DISCHARGE INFORMATION   Discharge Disposition: O Outpt-Another Facility   Discharge Location:  [facility]   Discharge Date and Time:  01/31/2021 18:30:00   ED Checkout Date and Time:  01/31/2021 18:30:00     DEPART REASON INCOMPLETE INFORMATION               Depart Action Incomplete Reason   Interactive View/I&O MD Decision to leave incomplete   Patient Education MD Decision to leave incomplete   Follow-up MD Decision to leave incomplete               Problems      No Problems Documented              Smoking Status      Never smoker         PATIENT EDUCATION INFORMATION  Instructions:          Follow up:            ED PROVIDER DOCUMENTATION

## 2021-01-31 NOTE — ED Provider Notes (Signed)
General Medical Problem *ED        Patient:   Ruben [name], Ruben [name]            MRN: [id number]            FIN: [id number]               Age:   70 years     Sex:  Male     DOB:  07/08/1951   Associated Diagnoses:   Ventricular tachycardia; ICD (implantable cardioverter-defibrillator) discharge   Author:   Joslyn Devon M-MD      Basic Information   Time seen: Provider Seen (ST)   ED Provider/Time:    Joslyn Devon M-MD / 01/31/2021 15:57  .   Additional information: Chief Complaint from Nursing Triage Note   Chief Complaint   No qualifying data available.Marland Kitchen      History of Present Illness   70 year old male with history of TAVR and V. tach status post pacemaker and ICD presents with a chief complaint of ICD firing.  Patient notes his ICD has gone off twice in the last 30 minutes.  He notes he had some palpitations today otherwise had felt in his normal health.  He denies chest pain or shortness of breath.  He notes he had some lightheadedness as if he might pass out.  On arrival patient was in V. tach with stable blood pressure then had a episode of ICD firing..        Review of Systems   Constitutional symptoms:  No fever,    Respiratory symptoms:  No shortness of breath,    Cardiovascular symptoms:  Palpitations, No chest pain,    Gastrointestinal symptoms:  No abdominal pain,    Musculoskeletal symptoms:  No back pain,    Neurologic symptoms:  Dizziness.   Endocrine symptoms:  No polyuria,       Health Status   Allergies:    Allergic Reactions (All)  No Known Allergies.   Medications:  (Selected)   Inpatient Medications  Ordered  amiodarone IV additive 360 mg + Premix D5W 200 mL: TITRATE, IV  amiodarone IVPB Bolus: 150 mg, 100 mL, 600 mL/hr, IV Piggyback, Once  aspirin: 324 mg, 4 tabs, Chewed, Once  calcium gluconate: 1 g, 50 mL, 100 mL/hr, IV Piggyback, Once  magnesium sulfate 1g IVPB: 1 g, 100 mL, 100 mL/hr, IV Piggyback, Once  Prescriptions  Prescribed  Toprol-XL 25 mg oral tablet, extended release: 25 mg, 1 tabs, Oral,  Daily, 90 tabs, 3 Refill(s)  fludrocortisone 0.1 mg oral tablet: 0.1 mg, 1 tabs, Oral, Daily, 90 caps, 3 Refill(s)  midodrine 5 mg oral tablet: 5 mg, 1 tabs, Oral, TID, 90 tabs, 3 Refill(s)  Documented Medications  Documented  aspirin 81 mg oral delayed release tablet: 81 mg, 1 tabs, Oral, Daily, 0 Refill(s)  levothyroxine 175 mcg (0.175 mg) oral tablet: 175 mcg, 1 tabs, Oral, Daily, 0 Refill(s)  venlafaxine 75 mg oral capsule, extended release: 75 mg, 1 caps, Oral, qPM, 30 caps, 0 Refill(s).      Past Medical/ Family/ Social History   Medical history: Reviewed as documented in chart.   Surgical history: Reviewed as documented in chart.   Family history: Not significant.   Social history: Reviewed as documented in chart.   Problem list:    Active Problems (9)  BPH (benign prostatic hyperplasia)   Dental crowns status   Gastritis   H/O: HTN (hypertension)   High cholesterol   Hypothyroid  Syncope   Tongue cancer   Vasovagal episode   .      Physical Examination   - GENERAL: Awake, Alert, mild distress.  - EYES: EOMI. Anicteric.  - HEAD: normocephalic, atraumatic  - ENT: Moist mucous membranes. throat supple, trachea midline   - LUNGS: coarse breath sounds equal bilaterally, no wheezing or rhonchi, no respiratory distress  - CARDIOVASCULAR: Tachycardic rate in the 200s, no peripheral edema  - ABDOMEN: Soft, non-tender and non-distended. No rebound or guarding  - EXTREMITIES: No deformity, normal range of motion  - SPINE: normal range of motion   - SKIN: Warm, dry, intact  - NEUROLOGIC: No focal neurological deficits.  Normal strength and sensation  - PSYCHIATRIC: Cooperative. Anxious       Medical Decision Making   Rationale:  70 year old male presents with chief complaint of palpitations.  Patient notes his ICD fired twice prior to arrival.  On arrival patient is in V. tach with stable blood pressure, mentation and no symptoms of chest pain or shortness of breath.  Patient's ICD did fire shortly after arrival.   Given amiodarone bolus magnesium and calcium with plan for amiodarone drip.  Will consult cardiology and EP.   Documents reviewed:  Emergency department nurses' notes.   Electrocardiogram:  Emergency Provider interpretation performed by me, Nonsustained V. tach, ventricular rate 142, left axis deviation.    Results review:  Lab results : Lab View   01/31/2021 16:42 EDT Estimated Creatinine Clearance 114.64 mL/min   01/31/2021 16:28 EDT Estimated Creatinine Clearance 152.85 mL/min   01/31/2021 16:10 EDT WBC 4.9 x10e3/mcL    RBC 4.49 x10e6/mcL    Hgb 14.6 g/dL    HCT 44.2 %    MCV 98.4 fL    MCH 32.5 pg    MCHC 33.0 g/dL    RDW 12.5 %    Platelet 208 x10e3/mcL    MPV 10.1 fL    Neutro Auto 57.4 %    Neutro Absolute 2.8 x10e3/mcL    Immature Grans Percent 0.4 %    Immature Grans Absolute 0.02 x10e3/mcL    Lymph Auto 26.6 %    Lymph Absolute 1.3 x10e3/mcL    Mono Auto 12.0 %    Mono Absolute 0.6 x10e3/mcL    Eosinophil Percent 3.0 %    Eos Absolute 0.2 x10e3/mcL    Basophil Auto 0.6 %    Baso Absolute 0.0 x10e3/mcL    Sodium Lvl 140 mmol/L    Potassium Lvl 3.9 mmol/L    Chloride 103 mmol/L    CO2 24 mmol/L    Glucose Random 98 mg/dL    BUN 19 mg/dL    Creatinine Lvl 0.8 mg/dL    AGAP 13 mmol/L    Osmolality Calc 282 mOsm/kg    Calcium Lvl 8.9 mg/dL    Protein Total 7.1 g/dL    Albumin Lvl 4.4 g/dL    Globulin Calc 3.0 g/dL    AG Ratio Calc 2.00 mmol/L    Alk Phos 92 unit/L    AST 18 unit/L    ALT 18 unit/L    eGFR AA 106 mL/min/1.66m???    eGFR Non-AA 91 mL/min/1.763m??    Magnesium Lvl 2.2 mg/dL    Bili Total 0.46 mg/dL    NTproBNP 121 pg/mL    Trop T Quant <0.010 ng/mL    COVID (SARS-CoV-2) Only ([place]) Not Detected   .   Radiology results:  Rad Results (ST)   XR Chest 1 View Portable  ?  01/31/21  16:45:22  CHEST, AP VIEW 01/31/21    COMPARISON: 10/15/2020    INDICATION: Chest pain    FINDINGS/IMPRESSION:      Minimal discoid atelectasis or linear fibrosis in the right midlung. Lungs are  otherwise clear. Heart and  mediastinum are stable compared to last exam.  Percutaneous aortic valve. Left chest AICD with leads in right atrium and right  ventricle. No pneumothorax.  ?  Signed By: Ottie Glazier A-MD  .      Reexamination/ Reevaluation   Course: unchanged.   Notes: Patient had decrease in his runs of V. tach however then had increase in his bigeminy and nonsustained V. tach.  Given 5 of Lopressor and discussed transfer to [place].      Procedure   Critical care note   Total time: 38 minutes spent engaged in work directly related to patient care and/ or available for direct patient care.   Critical condition(s) addressed for impending deterioration include: cardiovascular.   Associated risk factors: dysrhythmia.   Management: bedside assessment.   Performed by: self.   Notes: Total critical care time documented does not include time spent on separately billed procedures or the services of residents, students, nurses or physician assistants. I personally saw and examined the patient. I have reviewed all diagnostic interpretations and treatment plans as written. I was present for the key portions of any procedures performed and the inclusive time noted in any critical care statement. Critical care time includes patient management by me, time spent at the patients bedside, time to review lab and imaging results, discussing patient care, documentation in the medical record, and time spent with the family or caregiver.      Impression and Plan   Diagnosis   Ventricular tachycardia (ICD10-CM I47.2, Discharge, Medical)   ICD (implantable cardioverter-defibrillator) discharge (ICD10-CM Z45.02, Discharge, [place],  MINH-DO, cardiology, phone call, consult, recommends Patient had recent cardiac cath with clean vessels.  Recommends consult to EP.    Plan   Condition: Guarded.    Disposition: Place in Observation Telemetry Unit, [place],  Gaspar Bidding H-MD, transfer to [place] on [employer]     Counseled: Patient, Family, Regarding diagnosis, Regarding diagnostic results, Regarding treatment plan.    Signature Line     Electronically Signed on 01/31/2021 10:31 PM EDT   ________________________________________________   Joslyn Devon M-MD               Modified by: Joslyn Devon M-MD on 01/31/2021 04:26 PM EDT      Modified by: Joslyn Devon M-MD on 01/31/2021 06:10 PM EDT      Modified by: Joslyn Devon M-MD on 01/31/2021 10:31 PM EDT

## 2021-01-31 NOTE — ED Notes (Signed)
ED Patient Education Note     Patient Education Materials Follows:

## 2021-01-31 NOTE — Nursing Note (Signed)
Adult Patient History Form-Text       Adult Patient History Entered On:  01/31/2021 20:22 EDT    Performed On:  01/31/2021 20:10 EDT by Phillips Climes, RN, KERI A               General Info   Patient Identified :   Identification band, Verbal   Patient Identified :   Renae Fickle   Information Given By :   Self, Spouse   Preferred Mode of Communication :   Verbal, Written   Accompanied By :   Spouse   In Charge of News (ICON) Name :   Diesel Lina (wife) - [phone]   Pregnancy Status :   N/A   Has the patient received chemotherapy or immunotherapy (cytotoxic)  in the last 48-72 hours? :   No   In Clinical Trial With Signed Consent for Related Condition :   N/A   Is the patient currently (2-3 days) receiving radiation treatment? :   No   BRAME, RN, KERI A - 01/31/2021 20:10 EDT   Allergies   (As Of: 01/31/2021 20:22:02 EDT)   Allergies (Active)   No Known Allergies  Estimated Onset Date:   Unspecified ; Created By:   Eric Form D; Reaction Status:   Active ; Category:   Drug ; Substance:   No Known Allergies ; Type:   Allergy ; Updated By:   Eric Form D; Source:   Paper Chart/Abstracting ; Reviewed Date:   01/31/2021 20:11 EDT        Medication History   Medication List   (As Of: 01/31/2021 20:22:02 EDT)   Normal Order    amiodarone 360 mg + Premix D5W 200 mL  :   amiodarone 360 mg + Premix D5W 200 mL ; Status:   Ordered ; Ordered As Mnemonic:   amiodarone IV additive 360 mg + Premix D5W 200 mL ; Simple Display Line:   TITRATE, IV ; Ordering Provider:   Jimmie Molly H-MD; Catalog Code:   Premix D5W ; Order Dt/Tm:   01/31/2021 20:01:41 EDT ; Comment:   Infuse @ 1mg /min x 6hrs then decrease dose to 0.5mg /min; change solution, line and filter every 24hrs; Max dose 2.2grams/24hrs          amiodarone 360 mg + Premix D5W 200 mL  :   amiodarone 360 mg + Premix D5W 200 mL ; Status:   Discontinued ; Ordered As Mnemonic:   amiodarone IV additive 360 mg + Premix D5W 200 mL ; Simple Display Line:   TITRATE, IV ; Ordering Provider:    H-MD; Catalog Code:   Premix D5W ; Order Dt/Tm:   01/31/2021 16:18:04 EDT ; Comment:   Infuse @ 1mg /min x 6hrs then decrease dose to 0.5mg /min; change solution, line and filter every 24hrs; Max dose 2.2grams/24hrs          venlafaxine  :   venlafaxine ; Status:   Voided ; Ordered As Mnemonic:   venlafaxine 75 mg oral capsule, extended release ; Simple Display Line:   75 mg, 1 caps, Oral, qPM ; Ordering Provider:   02/02/2021 H-MD; Catalog Code:   venlafaxine ; Order Dt/Tm:   01/31/2021 20:01:39 EDT ; Comment:   ---  At home, patient was taking medication with the following details:  Comments:  DO NOT CRUSH    ---          aspirin 81 mg DR Tab  :  aspirin 81 mg DR Tab ; Status:   Ordered ; Ordered As Mnemonic:   aspirin ; Simple Display Line:   81 mg, 1 tabs, Oral, Daily ; Ordering Provider:   Jimmie Molly H-MD; Catalog Code:   aspirin ; Order Dt/Tm:   01/31/2021 20:01:06 EDT          fludrocortisone 0.1 mg Tab  :   fludrocortisone 0.1 mg Tab ; Status:   Ordered ; Ordered As Mnemonic:   fludrocortisone ; Simple Display Line:   0.1 mg, 1 tabs, Oral, Daily ; Ordering Provider:   Jimmie Molly H-MD; Catalog Code:   fludrocortisone ; Order Dt/Tm:   01/31/2021 20:01:20 EDT          metoprolol succinate 25 mg ER Tab  :   metoprolol succinate 25 mg ER Tab ; Status:   Ordered ; Ordered As Mnemonic:   Toprol-XL 25 mg oral tablet, extended release ; Simple Display Line:   25 mg, 1 tabs, Oral, Daily ; Ordering Provider:   Jimmie Molly H-MD; Catalog Code:   metoprolol ; Order Dt/Tm:   01/31/2021 20:01:24 EDT          tamsulosin 0.4 mg Cap  :   tamsulosin 0.4 mg Cap ; Status:   Ordered ; Ordered As Mnemonic:   tamsulosin ; Simple Display Line:   0.4 mg, 1 caps, Oral, Daily ; Ordering Provider:   Jimmie Molly H-MD; Catalog Code:   tamsulosin ; Order Dt/Tm:   01/31/2021 20:01:37 EDT          venlafaxine 37.5 mg ER Cap  :   venlafaxine 37.5 mg ER Cap ; Status:   Ordered ; Ordered As Mnemonic:   Effexor XR ; Simple  Display Line:   75 mg, 2 caps, Oral, Daily ; Ordering Provider:   Jimmie Molly H-MD; Catalog Code:   venlafaxine ; Order Dt/Tm:   01/31/2021 20:04:30 EDT          midodrine 5 mg Tab  :   midodrine 5 mg Tab ; Status:   Ordered ; Ordered As Mnemonic:   midodrine ; Simple Display Line:   5 mg, 1 tabs, Oral, TID ; Ordering Provider:   Jimmie Molly H-MD; Catalog Code:   midodrine ; Order Dt/Tm:   01/31/2021 20:01:29 EDT          levothyroxine 50 mcg (0.05 mg) Tab  :   levothyroxine 50 mcg (0.05 mg) Tab ; Status:   Ordered ; Ordered As Mnemonic:   levothyroxine ; Simple Display Line:   175 mcg, 3.5 tabs, Oral, Daily ; Ordering Provider:   Jimmie Molly H-MD; Catalog Code:   levothyroxine ; Order Dt/Tm:   01/31/2021 20:01:22 EDT          metoprolol tartrate 1 mg/mL Inj Soln 5 mL  :   metoprolol tartrate 1 mg/mL Inj Soln 5 mL ; Status:   Completed ; Ordered As Mnemonic:   Lopressor 1 mg/mL injectable solution ; Simple Display Line:   5 mg, 5 mL, IV Push, Once ; Ordering Provider:   Howie Ill M-MD; Catalog Code:   metoprolol ; Order Dt/Tm:   01/31/2021 16:45:01 EDT          aspirin 81 mg Chew Tab  :   aspirin 81 mg Chew Tab ; Status:   Completed ; Ordered As Mnemonic:   aspirin ; Simple Display Line:   324 mg, 4 tabs, Chewed, Once ; Ordering  Provider:   Howie IllPOPE,  SABRINA M-MD; Catalog Code:   aspirin ; Order Dt/Tm:   01/31/2021 16:16:38 EDT          amiodarone  :   amiodarone ; Status:   Completed ; Ordered As Mnemonic:   amiodarone IVPB Bolus ; Simple Display Line:   150 mg, 100 mL, 600 mL/hr, IV Piggyback, Once ; Ordering Provider:   Howie IllPOPE,  SABRINA M-MD; Catalog Code:   amiodarone ; Order Dt/Tm:   01/31/2021 16:09:15 EDT          calcium gluconate premix  :   calcium gluconate premix ; Status:   Completed ; Ordered As Mnemonic:   calcium gluconate ; Simple Display Line:   1 g, 50 mL, 100 mL/hr, IV Piggyback, Once ; Ordering Provider:   Howie IllPOPE,  SABRINA M-MD; Catalog Code:   calcium gluconate ; Order Dt/Tm:   01/31/2021 16:08:44  EDT          magnesium sulfate premix  :   magnesium sulfate premix ; Status:   Completed ; Ordered As Mnemonic:   magnesium sulfate 1g IVPB ; Simple Display Line:   1 g, 100 mL, 100 mL/hr, IV Piggyback, Once ; Ordering Provider:   Howie IllPOPE,  SABRINA M-MD; Catalog Code:   magnesium sulfate ; Order Dt/Tm:   01/31/2021 16:08:44 EDT            Prescription/Discharge Order    fludrocortisone  :   fludrocortisone ; Status:   Prescribed ; Ordered As Mnemonic:   fludrocortisone 0.1 mg oral tablet ; Simple Display Line:   0.1 mg, 1 tabs, Oral, Daily, 90 caps, 3 Refill(s) ; Ordering Provider:   Levy PupaGLAAB,  HANNAH B-PA-C; Catalog Code:   fludrocortisone ; Order Dt/Tm:   10/15/2020 16:57:23 EST          metoprolol  :   metoprolol ; Status:   Prescribed ; Ordered As Mnemonic:   Toprol-XL 25 mg oral tablet, extended release ; Simple Display Line:   25 mg, 1 tabs, Oral, Daily, 90 tabs, 3 Refill(s) ; Ordering Provider:   Levy PupaGLAAB,  HANNAH B-PA-C; Catalog Code:   metoprolol ; Order Dt/Tm:   10/15/2020 16:57:34 EST          midodrine  :   midodrine ; Status:   Prescribed ; Ordered As Mnemonic:   midodrine 5 mg oral tablet ; Simple Display Line:   5 mg, 1 tabs, Oral, TID, 90 tabs, 3 Refill(s) ; Ordering Provider:   Levy PupaGLAAB,  HANNAH B-PA-C; Catalog Code:   midodrine ; Order Dt/Tm:   10/15/2020 16:57:33 EST            Home Meds    silodosin  :   silodosin ; Status:   Documented ; Ordered As Mnemonic:   silodosin 8 mg oral capsule ; Simple Display Line:   8 mg, 1 caps, Oral, Once a Day (at bedtime), 0 Refill(s) ; Catalog Code:   silodosin ; Order Dt/Tm:   01/31/2021 19:51:00 EDT          aspirin  :   aspirin ; Status:   Documented ; Ordered As Mnemonic:   aspirin 81 mg oral delayed release tablet ; Simple Display Line:   81 mg, 1 tabs, Oral, Daily, 0 Refill(s) ; Ordering Provider:   Levy PupaGLAAB,  HANNAH B-PA-C; Catalog Code:   aspirin ; Order Dt/Tm:   10/15/2020 16:57:50 EST          levothyroxine  :   levothyroxine ;  Status:   Documented ; Ordered As  Mnemonic:   levothyroxine 175 mcg (0.175 mg) oral tablet ; Simple Display Line:   175 mcg, 1 tabs, Oral, Daily, 0 Refill(s) ; Catalog Code:   levothyroxine ; Order Dt/Tm:   10/11/2020 19:50:31 EST          venlafaxine  :   venlafaxine ; Status:   Documented ; Ordered As Mnemonic:   venlafaxine 75 mg oral capsule, extended release ; Simple Display Line:   75 mg, 1 caps, Oral, qPM, 30 caps, 0 Refill(s) ; Ordering Provider:   Maylon Peppers; Catalog Code:   venlafaxine ; Order Dt/Tm:   02/08/2017 14:37:22 EDT ; Comment:   DO NOT CRUSH            Problem History   (As Of: 01/31/2021 20:22:02 EDT)   Problems(Active)    Aortic stenosis (SNOMED CT  :161096045 )  Name of Problem:   Aortic stenosis ; Recorder:   Phillips Climes, RN, KERI A; Confirmation:   Confirmed ; Classification:   Patient Stated ; Code:   [id number] ; Contributor System:   Dietitian ; Last Updated:   01/31/2021 20:12 EDT ; Life Cycle Date:   01/31/2021 ; Life Cycle Status:   Active ; Vocabulary:   SNOMED CT        BPH (benign prostatic hyperplasia) (SNOMED CT  :782956213 )  Name of Problem:   BPH (benign prostatic hyperplasia) ; Recorder:   TURNER, RN, LYNN R; Confirmation:   Confirmed ; Classification:   Patient Stated ; Code:   [id number] ; Contributor System:   Dietitian ; Last Updated:   02/08/2017 14:41 EDT ; Life Cycle Date:   02/08/2017 ; Life Cycle Status:   Active ; Vocabulary:   SNOMED CT        Dental crowns status (SNOMED CT  :[id number] )  Name of Problem:   Dental crowns status ; Recorder:   TURNER, RN, LYNN R; Confirmation:   Confirmed ; Classification:   Patient Stated ; Code:   [id number] ; Contributor System:   Dietitian ; Last Updated:   02/08/2017 14:42 EDT ; Life Cycle Date:   02/08/2017 ; Life Cycle Status:   Active ; Vocabulary:   SNOMED CT        Gastritis (SNOMED CT  :3557322 )  Name of Problem:   Gastritis ; Recorder:   Steele Sizer RN, Cipriano Mile; Confirmation:   Confirmed ; Classification:    Patient Stated ; Code:   [id number] ; Contributor System:   PowerChart ; Last Updated:   09/07/2020 11:18 EDT ; Life Cycle Date:   09/07/2020 ; Life Cycle Status:   Active ; Vocabulary:   SNOMED CT        H/O: HTN (hypertension) (SNOMED CT  :623762831 )  Name of Problem:   H/O: HTN (hypertension) ; Recorder:   TURNER, RN, LYNN R; Confirmation:   Confirmed ; Classification:   Patient Stated ; Code:   [id number] ; Contributor System:   PowerChart ; Last Updated:   02/08/2017 14:40 EDT ; Life Cycle Date:   02/08/2017 ; Life Cycle Status:   Active ; Vocabulary:   SNOMED CT        High cholesterol (SNOMED CT  :71062694 )  Name of Problem:   High cholesterol ; Recorder:   TURNER, RN, LYNN R; Confirmation:   Confirmed ; Classification:   Patient Stated ; Code:   [id number] ; Contributor System:   Dietitian ; Last Updated:  02/08/2017 14:39 EDT ; Life Cycle Date:   02/08/2017 ; Life Cycle Status:   Active ; Vocabulary:   SNOMED CT        Hypothyroid (SNOMED CT  :29562130 )  Name of Problem:   Hypothyroid ; Recorder:   TURNER, RN, LYNN R; Confirmation:   Confirmed ; Classification:   Patient Stated ; Code:   [id number] ; Contributor System:   Dietitian ; Last Updated:   02/08/2017 14:39 EDT ; Life Cycle Date:   02/08/2017 ; Life Cycle Status:   Active ; Vocabulary:   SNOMED CT        Syncope (SNOMED CT  :629528413 )  Name of Problem:   Syncope ; Recorder:   Steele Sizer RN, Cipriano Mile; Confirmation:   Confirmed ; Classification:   Patient Stated ; Code:   [id number] ; Contributor System:   Dietitian ; Last Updated:   09/07/2020 11:18 EDT ; Life Cycle Date:   09/07/2020 ; Life Cycle Status:   Active ; Vocabulary:   SNOMED CT        Tongue cancer (SNOMED CT  :536644034 )  Name of Problem:   Tongue cancer ; Onset Date:   02/04/2017 ; Recorder:   Selena Batten RN, August Albino; Confirmation:   Confirmed ; Classification:   Patient Stated ; Code:   [id number] ; Contributor System:   PowerChart ; Last Updated:   03/15/2017 9:46 EDT ; Life Cycle Date:   03/15/2017 ; Life Cycle  Status:   Active ; Vocabulary:   SNOMED CT   ; Comments:        03/15/2017 9:46 - Selena Batten, RN, August Albino  had first surgery in Apr'18-now for Left Cervical Lymphadenectomy on 03/20/17      Vasovagal episode (SNOMED CT  :7564332951 )  Name of Problem:   Vasovagal episode ; Recorder:   Steele Sizer, RN, Cipriano Mile; Confirmation:   Confirmed ; Classification:   Patient Stated ; Code:   [id number] ; Contributor System:   PowerChart ; Last Updated:   09/07/2020 11:19 EDT ; Life Cycle Date:   09/07/2020 ; Life Cycle Status:   Active ; Vocabulary:   SNOMED CT          Procedure History        -    Procedure History   (As Of: 01/31/2021 20:22:02 EDT)     Procedure Dt/Tm:   02/13/2017 12:57:00 EDT ; Location:   [facility] ; Provider:   Janalyn Rouse; Anesthesia Type:   General ; Anesthesia Minutes:   0 ; Procedure Name:   Glossectomy (Partial) ; Procedure Minutes:   70 ; Comments:     02/13/2017 14:32 EDT - Terie Purser, RN, Vicente Serene  auto-populated from documented surgical case ; Clinical Service:   Surgery ; Last Reviewed Dt/Tm:   01/31/2021 20:15:23 EDT            Procedure Dt/Tm:   02/13/2017 12:57:00 EDT ; Location:   [facility] ; Provider:   Janalyn Rouse; Anesthesia Type:   General ; Anesthesia Minutes:   0 ; Procedure Name:   Skin Graft Split Thickness ; Procedure Minutes:   83 ; Comments:     02/13/2017 14:32 EDT - Terie Purser, RN, Vicente Serene  auto-populated from documented surgical case ; Clinical Service:   Surgery ; Last Reviewed Dt/Tm:   01/31/2021 20:15:23 EDT            Anesthesia Minutes:   0 ; Procedure Name:   Wisdom tooth ; Procedure Minutes:  0 ; Last Reviewed Dt/Tm:   01/31/2021 20:15:23 EDT            Anesthesia Minutes:   0 ; Procedure Name:   Colonoscopy ; Procedure Minutes:   0 ; Last Reviewed Dt/Tm:   01/31/2021 20:15:23 EDT            Procedure Dt/Tm:   03/20/2017 13:08:00 EDT ; Location:   [facility] ; Provider:   Janalyn Rouse; Anesthesia Type:   General ; :   SLATTERY-MD,  STEPHEN O; Anesthesia Minutes:   0 ; Procedure Name:   Neck  Dissection (Left, Cervical) ; Procedure Minutes:   136 ; Comments:     03/20/2017 15:37 EDT - Lorelle Formosa, RN, Dairl Ponder  auto-populated from documented surgical case ; Clinical Service:   Surgery ; Last Reviewed Dt/Tm:   01/31/2021 20:15:23 EDT            Anesthesia Minutes:   0 ; Procedure Name:   Dupuytren contracture of right palm ; Procedure Minutes:   0 ; Last Reviewed Dt/Tm:   01/31/2021 20:15:23 EDT            Procedure Dt/Tm:   08/27/2019 ; Anesthesia Minutes:   0 ; Procedure Name:   Aortic valve replacement and replacement of ascending aorta ; Procedure Minutes:   0 ; Last Reviewed Dt/Tm:   01/31/2021 20:15:23 EDT            Procedure Dt/Tm:   09/10/2019 ; Anesthesia Minutes:   0 ; Procedure Name:   Pacemaker catheter ; Procedure Minutes:   0 ; Last Reviewed Dt/Tm:   01/31/2021 20:15:23 EDT            Immunizations   Influenza Vaccine Status :   Received prior to admission, during current flu season   Cowen, RN, [place] A - 01/31/2021 20:10 EDT   ID Risk Screen Symptoms   Recent Travel History :   No recent travel   Close Contact with COVID-19 ID :   No   Last 14 days COVID-19 ID :   Yes - Not Detected (negative)   TB Symptom Screen :   No symptoms   C. diff Symptom/History ID :   Neither of the above   Patient Pregnant :   None of the above   MRSA/VRE Screening :   None of these apply   CRE Screening :   No   Phillips Climes, RN, KERI A - 01/31/2021 20:10 EDT   Bloodless Medicine   Is Blood Transfusion Acceptable to Patient :   Magda Kiel, RN, KERI A - 01/31/2021 20:10 EDT   Nutrition   MST Does Your Current Diet Include :   None   Nutrition Screen for Malnutrition :   Patient denies   Phillips Climes RN, KERI A - 01/31/2021 20:10 EDT   Functional   Sensory Deficits :   Other: reading glasses   ADLs Prior to Admission :   Murray Hodgkins, RN, KERI A - 01/31/2021 20:10 EDT   Social History   Social History   (As Of: 01/31/2021 20:22:02 EDT)   Tobacco:        Never smoker   (Last Updated: 02/13/2017 11:17:13 EDT by Virgie Dad RN, Roderick Pee)           Electronic Cigarette/Vaping:        Never Electronic Cigarette Use.   (Last Updated: 09/07/2020 15:53:37 EDT by Primus Bravo R-RN)  Alcohol:        Past   Comments:  02/13/2017 11:17 - Virgie Dad, RN, Misty Stanley R: NONE IN 10 YEARS   (Last Updated: 01/31/2021 20:17:20 EDT by Phillips Climes, RN, KERI A)          Substance Use:        Denies   (Last Updated: 02/08/2017 14:44:20 EDT by Mayford Knife, RN, LYNN R)          Sexual:        Gender identity: Identifies as male.  Sexual orientation: Straight or heterosexual.   (Last Updated: 01/31/2021 20:17:33 EDT by Phillips Climes, RN, KERI A)            Spiritual   Faith/Denomination :   [employer] Knuckles   Do you have a concern that you would like to address with a Chaplain? :   Yes   Do you have any religious/spiritual/cultural beliefs that could impact the way your care is provided? :   No   Phillips Climes, RN, Lorina Rabon A - 01/31/2021 20:10 EDT   Harm Screen   Suspect or Concern for: :   None   Feels Safe Where Live :   Yes   Last 3 mo, thoughts killing self/others :   Patient denies   Cognitively Impaired :   No   Ambulatory or Self Mobile in WC :   Yes   Phillips Climes, RN, KERI A - 01/31/2021 20:10 EDT   Advance Directive   Location of Advance Directive :   Family to bring in copy from home   Advance Directive :   Yes   Type of Advance Directive :   Living will, Medical durable power of attorney   Patient Wishes to Receive Further Information on Advance Directives :   No   Phillips Climes, RN, Tania Ade - 01/31/2021 20:10 EDT   Education   Written Language :   [place]   Caregiver/Advocate Primary Language :   [place]   Caregiver/Advocate Written Language :   English   Primary Language :   Hulan Fess, RN, KERI A - 01/31/2021 20:10 EDT   Caregiver/Advocate Language   Patient :   Programme researcher, broadcasting/film/video, Verbal explanation, Demonstration   Family :   Programme researcher, broadcasting/film/video, Verbal explanation, Demonstration   Phillips Climes, RN, KERI A - 01/31/2021 20:10 EDT   Barriers to Learning :   None evident   Teaching Method :   Demonstration, Explanation, Printed  materials   Responsible Learner Present for Session :   Yes   Additional Session Learner(s) Present :   Spouse   Phillips Climes, RN, KERI A - 01/31/2021 20:10 EDT   Preventative Measures Information   Unit/Room Orientation :   Verbalizes understanding, Demonstrates   Environmental Safety :   Verbalizes understanding, Demonstrates   Hand Washing :   Verbalizes understanding, Demonstrates   Infection Prevention :   Verbalizes understanding, Needs further teaching   DVT Prophylaxis :   Verbalizes understanding, Needs further teaching   Isolation Precaution :   Verbalizes understanding, Needs further teaching   Phillips Climes, RN, KERI A - 01/31/2021 20:10 EDT   DC Needs   CM Living Situation :   Home with no services   Home Caregiver Name/Relationship :   Bobbye Reinitz (wife)   Current Living Situation :   [phone]   Anticipated Discharge Needs :   None   Phillips Climes, RN, KERI A - 01/31/2021 20:10 EDT   Valuables and Belongings   Does Patient Have Valuables and Belongings :  Magda Kiel, RN, KERI A - 01/31/2021 20:10 EDT   Valuables and Belongings   At Bedside :   Clothes, Glasses, Cell phone, pants, shoes, cell phone, glasses   Phillips Climes, RN, KERI A - 01/31/2021 20:10 EDT   Patient Search Completed :   Marianne Sofia, RN, KERI A - 01/31/2021 20:10 EDT   Admission Complete   Admission Complete :   Yes   Phillips Climes, RN, KERI A - 01/31/2021 20:10 EDT

## 2021-01-31 NOTE — ED Notes (Signed)
 ED Patient Summary              [facility] Emergency Department  [address] [place], [place] [address]  [phone]  Discharge Instructions (Patient)  Ruben [name], Ruben [name]  DOB:  1951/06/19                   MRN: [id number]                   FIN: [id number]  Reason For Visit: Palpitations; A FIB  Final Diagnosis: ICD (implantable cardioverter-defibrillator) discharge; Ventricular tachycardia     Visit Date: 01/31/2021 15:55:00  Address: [address] [place] [facility] [address]  Phone: [phone]     Emergency Department Providers:         Primary Physician:      NIKI SAMULE HERO      [facility] would like to thank you for allowing us  to assist you with your healthcare needs. The following includes patient education materials and information regarding your injury/illness.     Follow-up Instructions:  You were seen today on an emergency basis. Please contact your primary care doctor for a follow up appointment. If you received a referral to a specialist doctor, it is important you follow-up as instructed.    It is important that you call your follow-up doctor to schedule and confirm the location of your next appointment. Your doctor may practice at multiple locations. The office location of your follow-up appointment may be different to the one written on your discharge instructions.    If you do not have a primary care doctor, please call [phone] for help in finding a [name] Provider. For help in finding a specialist doctor, please call [phone].    If your condition gets worse before your follow-up with your primary care doctor or specialist, please return to the Emergency Department.      Coronavirus 2019 (COVID-19) Reminders:     Patients age 30 - 62, with parental consent, and patients over age 3 can make an appointment for a COVID-19 vaccine. Patients can contact their [name] doctors' offices to schedule an  appointment to receive the COVID-19 vaccine. Patients who do not have a [name] physician can call [phone] to schedule vaccination appointments.      Follow Up Appointments:  Primary Care Provider:     Name: ALAINE GLATTER L-DO     Phone: [phone]          [facility]>          Medications that have not changed  Other Medications  aspirin (aspirin 81 mg oral delayed release tablet) 1 Tabs Oral (given by mouth) every day.  Last Dose:____________________  fludrocortisone (fludrocortisone 0.1 mg oral tablet) 1 Tabs Oral (given by mouth) every day. Refills: 3.  Last Dose:____________________  levothyroxine (levothyroxine 175 mcg (0.175 mg) oral tablet) 1 Tabs Oral (given by mouth) every day.  Last Dose:____________________  metoprolol (Toprol-XL 25 mg oral tablet, extended release) 1 Tabs Oral (given by mouth) every day. Refills: 3.  Last Dose:____________________  midodrine (midodrine 5 mg oral tablet) 1 Tabs Oral (given by mouth) 3 times a day. Refills: 3.  Last Dose:____________________  venlafaxine (venlafaxine 75 mg oral capsule, extended release) 1 Capsules Oral (given by mouth) once a day (in the evening)., DO NOT CRUSH  Last Dose:____________________      Allergy  Info: No Known Allergies     >Discharge Additional Information    Patient Education Materials:       ---------------------------------------------------------------------------------------------------------------------  [facility] allows patients to review your COVID and other test results as well as discharge documents from any [name], Emergency Department, surgical center or outpatient lab. Test results are typically available 36 hours after the test is completed.     [name] encourages you to self-enroll in the [facility] Patient Portal.     To begin your self-enrollment process, please visit [email or web address]  [facility], click on "Sign up now".     NOTE: You must be 16 years and older to use [facility] Self-Enroll online. If you are a parent, caregiver, or guardian; you need an invite to access your child's or dependent's health records. To obtain an invite, contact the Medical Records department at [phone] Monday through Friday, 8-4:30, select option 3 . If we receive your call afterhours, we will return your call the next business day.     If you have issues trying to create or access your account, contact [employer] support at [phone] available 7 days a week 24 hours a day.     Comment:

## 2021-02-01 LAB — TROPONIN T
Troponin T: <0.01 ng/mL (ref 0.000–0.010)
Troponin T: <0.01 ng/mL (ref 0.000–0.010)

## 2021-02-01 NOTE — Case Communication (Signed)
CM D/C Planning Assessment Ongoing- Text       CM Admission Assessment Entered On:  2021/02/26 11:41 EDT    Performed On:  Feb 26, 2021 11:34 EDT by Ruben [name] N-RN               CM Admission Assessment   CM Reason for Care Management Referral :   Assess support system, Discharge planning assessment   CM Insurance Information :   Medicare, [place]. Name :   Medicare  AARP   CM Patient has PCP :   Yes   CM PCP Search :   Ramiro Harvest L-DO   Discharge Transporation Needs :   No   CM Assessment Discussion With :   Patient   CM Home/Lay Caregiver Name/Relationship :   Ruben [name] (spouse)   CM Home/Lay Caregiver Contact Number :   [phone]   CM Living Situation :   Home with no services, Family support   CM Patient Admitted From :   Patient resides with spouse, no services in place.    CM Initial Tentative Discharge Plan :   Home   CM Alternate Discharge Plan :   [facility]   Anticipated Hosp Related Barriers to DC :   None identified   CM Home Barriers :   None   CM Progress Note :   02/26/2021 EL: CM met with patient at bedside. Patient is AAO. Patient resides with his spouse, who will transport at discharge. Patient is independent with personal care. Patient denies DME or [place] services. PCP is Dr. Erik Obey and referral sent to [facility] via [place]. IM verbally reviewed and copy in chart.      CM Admission Assessment Complete :   Yes   Ruben [name] N-RN - 02-26-21 11:34 EDT   Z Codes   Z code documentation :   Not applicable   Ruben [name] N-RN - 26-Feb-2021 11:34 EDT

## 2021-02-01 NOTE — Progress Notes (Signed)
Chaplaincy Note - Text       Chaplaincy Note Entered On:  02/01/2021 9:10 EDT    Performed On:  02/01/2021 9:05 EDT by Vaughan Basta               Chaplaincy Consult   Faith/Denomination :   Ephriam Knuckles   Importance of Faith :   Very important   Worshipping Community :   [employer]   Religious Spiritual Resources :   Faith community support, Family support, [place], Spiritual well being, Other: Pt supported by his wife who was at his bedside.   Religious Spiritual Practices :   Prayer, Other: Pt and wife requested prayer.   Religious Spiritual Concerns :   Other: Pt is concerned about the issue with his device for his heart.   Additional Information, Chaplaincy :   Pt. was alert and engaging. His strong faith and hopefulness will add to his process of recovery.     Vaughan Basta - 02/01/2021 9:05 EDT

## 2021-02-03 NOTE — Nursing Note (Signed)
Nursing Discharge Summary - Text       Nursing Discharge Summary Entered On:  02/03/2021 11:19 EDT    Performed On:  02/03/2021 11:18 EDT by Valetta Mole, RN, Kathreen Cosier               DC Information   Discharge To :   Home independently, Family support   Mode of Discharge :   Wheelchair   Transportation :   Private vehicle   Accompanied By :   Alita Chyle, RN, Kathreen Cosier - 02/03/2021 11:18 EDT   Education   Responsible Learner(s) :   Home Caregiver Name/Relationship: Audry Pili (wife)        Performed by: Junie Panning - 02/02/2021 00:11  Home Caregiver Phone Number: [phone]        Performed by: Malka So A - 01/31/2021 20:10     Home Caregiver Present for Session :   Yes   Barriers To Learning :   None evident   Teaching Method :   Demonstration, Explanation, Printed materials   Colburn, RN, Kathreen Cosier - 02/03/2021 11:18 EDT   Post-Hospital Education Adult Grid   Activity Expectations :   Verbalizes understanding   Bladder Management :   Verbalizes understanding   Bowel Management :   [employer] understanding   Community Resources :   [employer] understanding   Diagnostic Results :   TEFL teacher understanding   Disease Process :   TEFL teacher understanding   Equipment/Devices :   TEFL teacher understanding   Importance of Follow-Up Visits :   Verbalizes understanding   Invasive Line Care :   TEFL teacher understanding   Pain Management :   Verbalizes understanding   Physical Limitations :   Verbalizes understanding   Plan of Care :   Verbalizes understanding   Postoperative Instructions :   TEFL teacher understanding   Substance Abuse :   Verbalizes understanding   When to Call Health Care Provider :   [employer] understanding   CHARPIA, RN, Kathreen Cosier - 02/03/2021 11:18 EDT   Medication Education Adult Grid   Drug to Drug Interactions :   Verbalizes understanding   Drug to Food Interactions :   TEFL teacher understanding   Med Dosage, Route, Scheduling :   TEFL teacher understanding   Med Generic/Brand Name,  Purpose, Action :   Verbalizes understanding   Med Preadministration Procedures :   [employer] understanding   Med Teacher, music, Storage :   [employer] understanding   Medication Precautions :   Environmental consultant :   IT sales professional, Medication :   Verbalizes understanding   CHARPIA, RN, KIMBERLY L - 02/03/2021 11:18 EDT

## 2021-02-03 NOTE — Case Communication (Signed)
CM Discharge Planning Assessment - Text       CM Discharge Plan Entered On:  02/03/2021 10:05 EDT    Performed On:  02/03/2021 10:01 EDT by Jackqulyn Livings L-RN               CM Discharge Plan   CM Medicare Patient :   Yes   CM 3 MN Stay Validated :   Yes   CM StCM Statusatus :   IP   CM Date of 3rd MN Occurrence :   02/03/2021 0:00 EDT   CM Date/Time Discharge IM :   02/01/2021 9:13 EDT   Jackqulyn Livings L-RN - 02/03/2021 10:06 EDT   Discharge To :   Home independently, Family support   CM Home/Lay Caregiver Name/Relationship :   Ruben [name] (spouse)   CM Home/Lay Caregiver Contact Number :   [phone]   CM Progress Note :   02/01/21 EL: CM met with patient at bedside. Patient is AAO. Patient resides with his spouse, who will transport at discharge. Patient is independent with personal care. Patient denies DME or [place] services. PCP is Dr. Erik Obey and referral sent to [place] [facility] via [place]. IM verbally reviewed and copy in chart.     02/03/21 - JWoj - DC order in.  No dx for HF Clinic referral, no HH/DME needs noted.  OK to d/c per CM standpoint.      Jackqulyn Livings L-RN - 02/03/2021 10:01 EDT

## 2021-03-08 DIAGNOSIS — Q6100 Congenital renal cyst, unspecified: Secondary | ICD-10-CM | POA: Diagnosis not present

## 2021-03-08 DIAGNOSIS — M109 Gout, unspecified: Secondary | ICD-10-CM | POA: Diagnosis not present

## 2021-03-08 DIAGNOSIS — F1021 Alcohol dependence, in remission: Secondary | ICD-10-CM | POA: Diagnosis not present

## 2021-03-08 DIAGNOSIS — J449 Chronic obstructive pulmonary disease, unspecified: Secondary | ICD-10-CM | POA: Diagnosis not present

## 2021-03-08 DIAGNOSIS — R972 Elevated prostate specific antigen [PSA]: Secondary | ICD-10-CM | POA: Diagnosis not present

## 2021-03-08 DIAGNOSIS — N400 Enlarged prostate without lower urinary tract symptoms: Secondary | ICD-10-CM | POA: Diagnosis not present

## 2021-03-08 DIAGNOSIS — B182 Chronic viral hepatitis C: Secondary | ICD-10-CM | POA: Diagnosis not present

## 2021-03-08 DIAGNOSIS — Z0000 Encounter for general adult medical examination without abnormal findings: Secondary | ICD-10-CM | POA: Diagnosis not present

## 2021-03-08 DIAGNOSIS — G40909 Epilepsy, unspecified, not intractable, without status epilepticus: Secondary | ICD-10-CM | POA: Diagnosis not present

## 2021-03-08 DIAGNOSIS — I10 Essential (primary) hypertension: Secondary | ICD-10-CM | POA: Diagnosis not present

## 2021-03-08 DIAGNOSIS — G459 Transient cerebral ischemic attack, unspecified: Secondary | ICD-10-CM | POA: Diagnosis not present

## 2021-03-08 DIAGNOSIS — Z1389 Encounter for screening for other disorder: Secondary | ICD-10-CM | POA: Diagnosis not present

## 2021-03-08 DIAGNOSIS — R911 Solitary pulmonary nodule: Secondary | ICD-10-CM | POA: Diagnosis not present

## 2021-03-08 DIAGNOSIS — I700 Atherosclerosis of aorta: Secondary | ICD-10-CM | POA: Diagnosis not present

## 2021-03-22 LAB — RETICULOCYTES
RBC: 4.19 x10e6/mcL (ref 4.00–5.60)
Retic Ct Abs: 0.119 /mcL (ref 0.0235–0.1220)
Retic Ct Pct: 2.8 % — ABNORMAL HIGH (ref 0.5–2.0)

## 2021-03-23 LAB — COMPREHENSIVE METABOLIC PANEL
ALT: 26 U/L (ref 0–50)
AST: 26 U/L (ref 0–50)
Albumin/Globulin Ratio: 2 mmol/L (ref 1.00–2.70)
Albumin: 4.7 g/dL (ref 3.5–5.2)
Alk Phosphatase: 86 U/L (ref 40–130)
Anion Gap: 13 mmol/L (ref 2–17)
BUN: 20 mg/dL (ref 8–23)
Bun/Cre Ratio: 22.2 — ABNORMAL HIGH (ref 6.0–17.0)
CO2: 24 mmol/L (ref 22–29)
Calcium: 8.7 mg/dL — ABNORMAL LOW (ref 8.8–10.2)
Chloride: 103 mmol/L (ref 98–107)
Creatinine: 0.9 mg/dL (ref 0.7–1.3)
GFR African American: 101 mL/min/{1.73_m2} (ref >=90)
GFR Non-African American: 87 mL/min/{1.73_m2} — ABNORMAL LOW (ref >=90)
Globulin: 2.4 g/dL (ref 1.9–4.4)
Glucose: 99 mg/dL (ref 70–99)
OSMOLALITY CALCULATED: 282 mOsm/kg (ref 270–287)
Potassium: 4.4 mmol/L (ref 3.5–5.3)
Sodium: 140 mmol/L (ref 135–145)
Total Bilirubin: 0.5 mg/dL (ref 0.00–1.20)
Total Protein: 7.1 g/dL (ref 6.4–8.3)

## 2021-03-23 LAB — IRON AND TIBC
Iron Saturation: 38 % (ref 20–40)
Iron: 135 ug/dL (ref 59–158)
TIBC: 351 ug/dL (ref 250–450)
UIBC: 216 ug/dL (ref 112.0–347.0)

## 2021-03-23 LAB — FERRITIN: Ferritin: 67.7 ng/mL (ref 30.0–400.0)

## 2021-03-23 LAB — FOLATE: Folate: 7.5 ng/mL (ref 4.80–24.20)

## 2021-03-23 LAB — SOLUBLE TRANSFERRIN RECEPTOR: Soluble Transferrin Recept: 19.9 nmol/L (ref 12.2–27.3)

## 2021-03-23 LAB — VITAMIN B12: Vitamin B-12: 422 pg/mL (ref 232–1245)

## 2021-05-17 ENCOUNTER — Encounter: Payer: Self-pay | Admitting: *Deleted

## 2021-05-23 ENCOUNTER — Other Ambulatory Visit: Payer: Self-pay | Admitting: Internal Medicine

## 2021-05-23 ENCOUNTER — Ambulatory Visit
Admission: RE | Admit: 2021-05-23 | Discharge: 2021-05-23 | Disposition: A | Discharge status: Home / Self Care | Payer: Self-pay | Source: Ambulatory Visit | Attending: Internal Medicine | Admitting: Internal Medicine

## 2021-05-23 DIAGNOSIS — R911 Solitary pulmonary nodule: Secondary | ICD-10-CM

## 2021-06-13 DIAGNOSIS — E538 Deficiency of other specified B group vitamins: Secondary | ICD-10-CM

## 2021-06-14 ENCOUNTER — Encounter: Attending: Family Medicine | Primary: Family Medicine

## 2021-06-14 ENCOUNTER — Encounter: Primary: Family Medicine

## 2021-06-14 NOTE — Telephone Encounter (Signed)
No further action

## 2021-06-14 NOTE — Telephone Encounter (Signed)
Please advise.Ruben [name]

## 2021-06-14 NOTE — Telephone Encounter (Signed)
Pt's wife called.  Pt in the ER at [facility].  Pt treated for a heart arrhythmia.  NP cancelled  for today

## 2021-06-17 NOTE — Addendum Note (Signed)
Addended by: Trena Platt on: 06/17/2021 09:31 AM     Modules accepted: Level of Service, SmartSet

## 2021-06-17 NOTE — Addendum Note (Signed)
Addended by: Trena Platt on: 06/17/2021 09:30 AM     Modules accepted: Orders, Level of Service, SmartSet

## 2021-06-17 NOTE — Progress Notes (Signed)
This encounter was created in error - please disregard.

## 2021-07-03 ENCOUNTER — Emergency Department (HOSPITAL_COMMUNITY)
Admission: EM | Admit: 2021-07-03 | Discharge: 2021-07-03 | Disposition: A | Discharge status: Home / Self Care | Payer: No Typology Code available for payment source | Attending: Emergency Medicine | Admitting: Emergency Medicine

## 2021-07-03 ENCOUNTER — Encounter (HOSPITAL_COMMUNITY): Payer: Self-pay

## 2021-07-03 ENCOUNTER — Other Ambulatory Visit: Payer: Self-pay

## 2021-07-03 ENCOUNTER — Emergency Department (HOSPITAL_COMMUNITY): Payer: No Typology Code available for payment source

## 2021-07-03 DIAGNOSIS — Z20822 Contact with and (suspected) exposure to covid-19: Secondary | ICD-10-CM | POA: Diagnosis not present

## 2021-07-03 DIAGNOSIS — Z87891 Personal history of nicotine dependence: Secondary | ICD-10-CM | POA: Diagnosis not present

## 2021-07-03 DIAGNOSIS — N182 Chronic kidney disease, stage 2 (mild): Secondary | ICD-10-CM | POA: Insufficient documentation

## 2021-07-03 DIAGNOSIS — J45909 Unspecified asthma, uncomplicated: Secondary | ICD-10-CM | POA: Diagnosis not present

## 2021-07-03 DIAGNOSIS — I129 Hypertensive chronic kidney disease with stage 1 through stage 4 chronic kidney disease, or unspecified chronic kidney disease: Secondary | ICD-10-CM | POA: Diagnosis not present

## 2021-07-03 DIAGNOSIS — Z7982 Long term (current) use of aspirin: Secondary | ICD-10-CM | POA: Diagnosis not present

## 2021-07-03 DIAGNOSIS — Z7951 Long term (current) use of inhaled steroids: Secondary | ICD-10-CM | POA: Diagnosis not present

## 2021-07-03 DIAGNOSIS — J441 Chronic obstructive pulmonary disease with (acute) exacerbation: Secondary | ICD-10-CM

## 2021-07-03 DIAGNOSIS — Z79899 Other long term (current) drug therapy: Secondary | ICD-10-CM | POA: Diagnosis not present

## 2021-07-03 DIAGNOSIS — Z8673 Personal history of transient ischemic attack (TIA), and cerebral infarction without residual deficits: Secondary | ICD-10-CM | POA: Insufficient documentation

## 2021-07-03 DIAGNOSIS — R0602 Shortness of breath: Secondary | ICD-10-CM | POA: Diagnosis present

## 2021-07-03 LAB — CBC WITH DIFFERENTIAL/PLATELET
Abs Immature Granulocytes: 0.01 10*3/uL (ref 0.00–0.07)
Basophils Absolute: 0 10*3/uL (ref 0.0–0.1)
Basophils Relative: 0 %
Eosinophils Absolute: 0.3 10*3/uL (ref 0.0–0.5)
Eosinophils Relative: 4 %
HCT: 37.9 % — ABNORMAL LOW (ref 39.0–52.0)
Hemoglobin: 12.9 g/dL — ABNORMAL LOW (ref 13.0–17.0)
Immature Granulocytes: 0 %
Lymphocytes Relative: 55 %
Lymphs Abs: 4 10*3/uL (ref 0.7–4.0)
MCH: 32 pg (ref 26.0–34.0)
MCHC: 34 g/dL (ref 30.0–36.0)
MCV: 94 fL (ref 80.0–100.0)
Monocytes Absolute: 0.5 10*3/uL (ref 0.1–1.0)
Monocytes Relative: 6 %
Neutro Abs: 2.6 10*3/uL (ref 1.7–7.7)
Neutrophils Relative %: 35 %
Platelets: 247 10*3/uL (ref 150–400)
RBC: 4.03 MIL/uL — ABNORMAL LOW (ref 4.22–5.81)
RDW: 12.4 % (ref 11.5–15.5)
WBC: 7.4 10*3/uL (ref 4.0–10.5)
nRBC: 0 % (ref 0.0–0.2)

## 2021-07-03 LAB — RESP PANEL BY RT-PCR (FLU A&B, COVID) ARPGX2
Influenza A by PCR: NEGATIVE
Influenza B by PCR: NEGATIVE
SARS Coronavirus 2 by RT PCR: NEGATIVE

## 2021-07-03 LAB — BASIC METABOLIC PANEL
Anion gap: 7 (ref 5–15)
BUN: 13 mg/dL (ref 8–23)
CO2: 26 mmol/L (ref 22–32)
Calcium: 9.5 mg/dL (ref 8.9–10.3)
Chloride: 109 mmol/L (ref 98–111)
Creatinine, Ser: 1.07 mg/dL (ref 0.61–1.24)
GFR, Estimated: >60 mL/min (ref >60)
Glucose, Bld: 110 mg/dL — ABNORMAL HIGH (ref 70–99)
Potassium: 4 mmol/L (ref 3.5–5.1)
Sodium: 142 mmol/L (ref 135–145)

## 2021-07-03 IMAGING — DX DG CHEST 1V PORT
2 series · 2 of 2 positions shown · non-contrast
Comparison: Outside Chest CT [DATE] and earlier.

CLINICAL DATA: 69-year-old male with shortness of breath. Out of
albuterol.

EXAM:
PORTABLE CHEST 1 VIEW

[chest ap (1 of 2)]
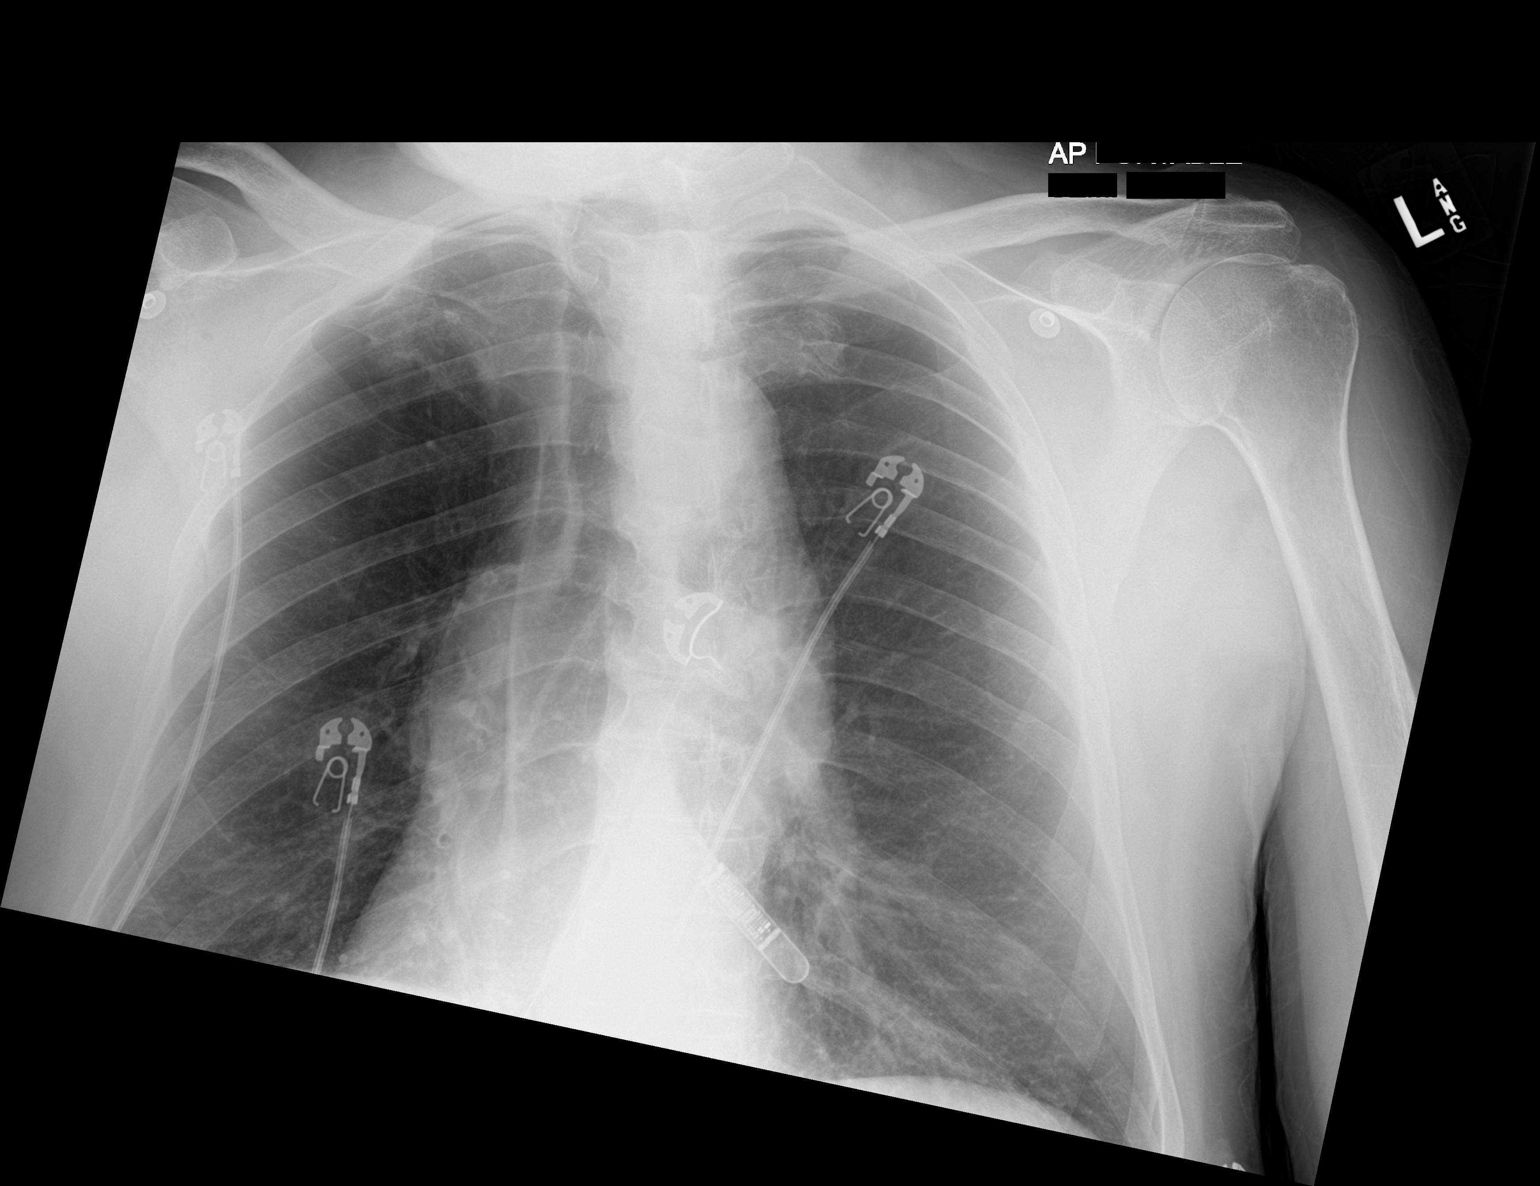

[chest ap (2 of 2)]
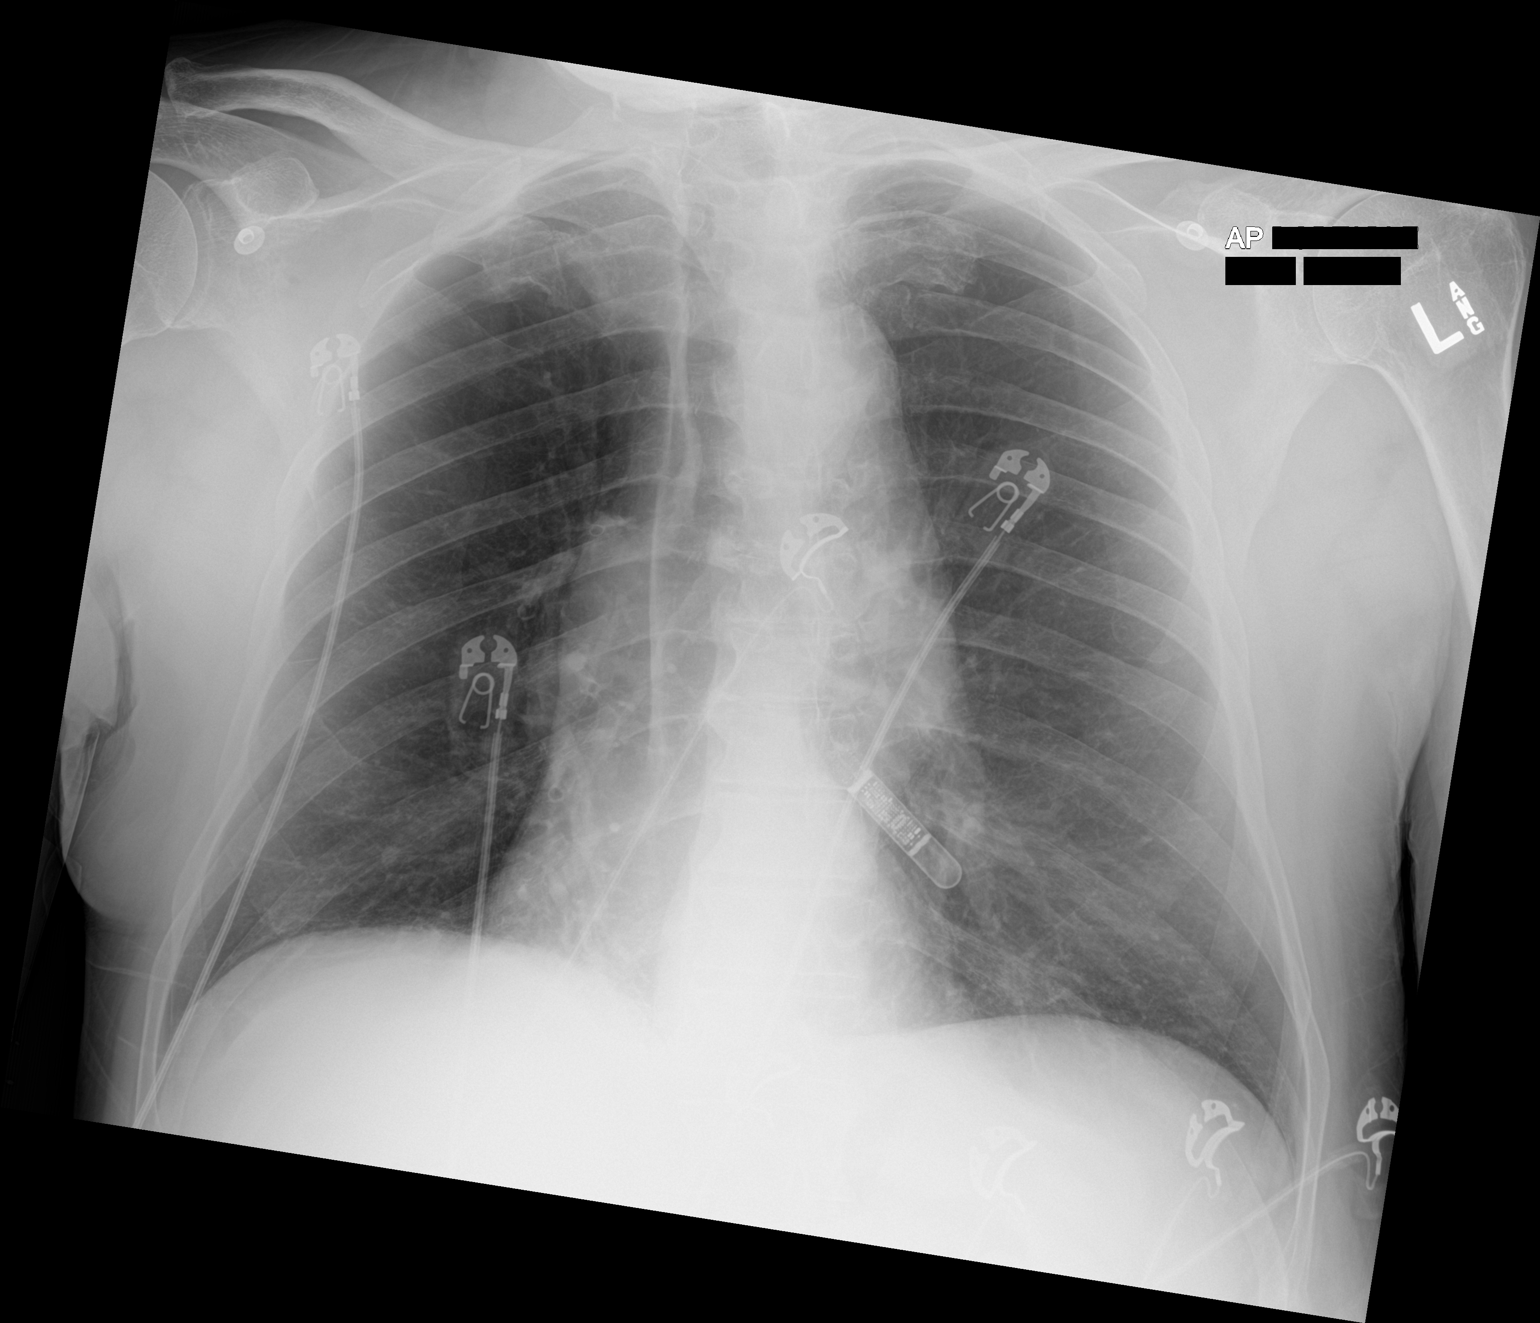

[2 of 2 positions shown; findings below may reference images not displayed]

FINDINGS: Portable AP semi upright views at [IG] hours. Stable cardiac size
and mediastinal contours. Visualized tracheal air column is within
normal limits. Chronic anterior chest wall cardiac event recorder.

Mildly rotated, anterior junction line displaced to the right. Large
lung volumes. Medial left upper lobe lung nodule seen on prior CT
and PET-CT is not evident with portable technique. Allowing for
portable technique the lungs are clear. No pneumothorax or pleural
effusion.

No acute osseous abnormality identified.
IMPRESSION: Emphysema ([IG]-[IG]).  No acute cardiopulmonary abnormality.

## 2021-07-03 MED ORDER — IPRATROPIUM BROMIDE HFA 17 MCG/ACT IN AERS
2.0000 | INHALATION_SPRAY | Freq: Once | RESPIRATORY_TRACT | Status: AC
  Administered 2021-07-03: 2 via RESPIRATORY_TRACT
  Filled 2021-07-03: qty 12.9

## 2021-07-03 MED ORDER — MAGNESIUM SULFATE 50 % IJ SOLN: 1.0000 g | Freq: Once | INTRAMUSCULAR | Status: DC

## 2021-07-03 MED ORDER — PREDNISONE 20 MG PO TABS: 60.0000 mg | ORAL_TABLET | Freq: Every day | ORAL | 0 refills | Status: AC

## 2021-07-03 MED ORDER — METHYLPREDNISOLONE SODIUM SUCC 125 MG IJ SOLR
125.0000 mg | Freq: Once | INTRAMUSCULAR | Status: AC
  Administered 2021-07-03: 125 mg via INTRAVENOUS
  Filled 2021-07-03: qty 2

## 2021-07-03 MED ORDER — ALBUTEROL SULFATE HFA 108 (90 BASE) MCG/ACT IN AERS
6.0000 | INHALATION_SPRAY | Freq: Once | RESPIRATORY_TRACT | Status: AC
  Administered 2021-07-03: 6 via RESPIRATORY_TRACT
  Filled 2021-07-03: qty 6.7

## 2021-07-03 MED ORDER — MAGNESIUM SULFATE IN D5W 1-5 GM/100ML-% IV SOLN
1.0000 g | Freq: Once | INTRAVENOUS | Status: AC
  Administered 2021-07-03: 1 g via INTRAVENOUS
  Filled 2021-07-03: qty 100

## 2021-07-03 NOTE — Discharge Instructions (Addendum)
Take albuterol 4 puffs every 4 hours for the next 24 hours and then as needed.  Take Atrovent 2 puffs every 6 hours for the next 24 hours and then as needed.  Take prednisone as prescribed.  Take your next prednisone dose tomorrow.

## 2021-07-03 NOTE — ED Triage Notes (Signed)
Patient brought in via ems from home. Patient states he has been out of albuterol for awhile and has been SOB this whole week. On room air. EMS gave breathing tx. VSS. A&Ox4

## 2021-07-03 NOTE — ED Provider Notes (Signed)
[facility] DEPT Provider Note   CSN: [id number] Arrival date & time: 07/03/21  3151     History Chief Complaint  Patient presents with   Shortness of Breath    Bruce Sullivan is a 70 y.o. male.  The history is provided by the patient.  Shortness of Breath Severity:  Moderate Onset quality:  Gradual Duration:  1 week Timing:  Intermittent Progression:  Waxing and waning Chronicity:  Recurrent Context comment:  SOB, wheezing Relieved by:  Inhaler Worsened by:  Exertion Associated symptoms: cough and wheezing   Associated symptoms: no abdominal pain, no chest pain, no claudication, no diaphoresis, no ear pain, no fever, no rash, no sore throat and no vomiting   Risk factors comment:  COPD     Past Medical History:  Diagnosis Date   Anxiety    Asthma    Atherosclerosis 01/16/2015   Noted on CXR   Bilateral lower extremity edema 01/16/2015   Chronic kidney disease (CKD), stage II (mild)    COPD (chronic obstructive pulmonary disease) (HCC)    Depression    GERD (gastroesophageal reflux disease)    Gout    History of colon polyps    Hoarseness    Hyperlipidemia    Hypertension    Neck abscess 09/03/2016   Pulmonary emphysema ([place])    Seizure ([place])    Stroke ([place]) 2014   Thyroid disease    TIA (transient ischemic attack)    Urinary frequency    Urine retention    UTI (urinary tract infection)     Patient Active Problem List   Diagnosis Date Noted   History of alcohol dependence ([place]) 12/18/2019   BPH (benign prostatic hyperplasia) 12/18/2019   Hepatitis C antibody positive in blood 12/18/2019   Abdominal distension 12/18/2019   Current moderate episode of major depressive disorder without prior episode ([place]) 04/10/2017   Stage 2 chronic kidney disease 04/10/2017   Chronic obstructive pulmonary disease ([place]) 03/26/2017   Essential hypertension 03/26/2017   GAD (generalized anxiety disorder) 03/26/2017   GERD (gastroesophageal  reflux disease) 03/26/2017   History of CVA (cerebrovascular accident) 03/26/2017   Mixed hyperlipidemia 03/26/2017   Seizure disorder ([place]) 03/26/2017    Past Surgical History:  Procedure Laterality Date   COLONOSCOPY  08/29/2018   CYSTOSCOPY WITH BIOPSY N/A 11/20/2018   Procedure: CYSTOSCOPY WITH BLADDER BIOPSY;  Surgeon: Ceasar Mons, MD;  Location[facility];  Service: Urology;  Laterality: N/A;   HERNIA REPAIR     t monitor     TRANSURETHRAL RESECTION OF PROSTATE N/A 11/20/2018   Procedure: TRANSURETHRAL RESECTION OF THE PROSTATE (TURP);  Surgeon: Ceasar Mons, MD;  Location: [facility];  Service: Urology;  Laterality: N/A;       Family History  Problem Relation Age of Onset   Liver cancer Neg Hx    Liver disease Neg Hx     Social History   Tobacco Use   Smoking status: Former   Smokeless tobacco: Never  Scientific laboratory technician Use: Never used  Substance Use Topics   Alcohol use: No   Drug use: No    Home Medications Prior to Admission medications   Medication Sig Start Date End Date Taking? Authorizing Provider  predniSONE (DELTASONE) 20 MG tablet Take 3 tablets (60 mg total) by mouth daily for 5 days. 07/03/21 07/08/21 Yes Frances Joynt, DO  albuterol (VENTOLIN HFA) 108 (90 Base) MCG/ACT inhaler Inhale 2 puffs into the  lungs every 6 (six) hours as needed for wheezing or shortness of breath. 06/28/14   [provider]  albuterol (VENTOLIN HFA) 108 (90 Base) MCG/ACT inhaler Inhale 1-2 puffs into the lungs every 6 (six) hours as needed for wheezing or shortness of breath. 12/27/20   Luna Fuse, MD  allopurinol (ZYLOPRIM) 100 MG tablet Take 100 mg by mouth 2 (two) times daily. 08/02/18   [provider]  aspirin EC 81 MG tablet Take 81 mg by mouth daily. Swallow whole.    [provider]  atorvastatin (LIPITOR) 10 MG tablet Take 10 mg by mouth at bedtime.    [provider]   budesonide-formoterol (SYMBICORT) 80-4.5 MCG/ACT inhaler Inhale 2 puffs into the lungs 2 (two) times daily.    [provider]  chlorthalidone (HYGROTON) 50 MG tablet Take 50 mg by mouth daily.    [provider]  FLUoxetine (PROZAC) 20 MG capsule Take 20 mg by mouth daily. 08/02/18   [provider]  HYDROcodone-acetaminophen (NORCO) 5-325 MG tablet Take 1 tablet by mouth every 4 (four) hours as needed for moderate pain. Patient not taking: Reported on 12/27/2020 11/20/18   Ceasar Mons, MD  mometasone-formoterol [facility]) 200-5 MCG/ACT AERO Inhale 2 puffs into the lungs at bedtime. 06/28/14   [provider]  omeprazole (PRILOSEC) 20 MG capsule Take 20 mg by mouth daily.    [provider]    Allergies    Bee venom  Review of Systems   Review of Systems  Constitutional:  Negative for chills, diaphoresis and fever.  HENT:  Negative for ear pain and sore throat.   Eyes:  Negative for pain and visual disturbance.  Respiratory:  Positive for cough, shortness of breath and wheezing.   Cardiovascular:  Negative for chest pain, palpitations and claudication.  Gastrointestinal:  Negative for abdominal pain and vomiting.  Genitourinary:  Negative for dysuria and hematuria.  Musculoskeletal:  Negative for arthralgias and back pain.  Skin:  Negative for color change and rash.  Neurological:  Negative for seizures and syncope.  All other systems reviewed and are negative.  Physical Exam Updated Vital Signs BP (!) 154/91   Pulse 82   Temp 98.8 F (37.1 C) (Oral)   Resp (!) 22   Ht 5\' 10"  (1.778 m)   Wt 86.2 kg   SpO2 94%   BMI 27.26 kg/m   Physical Exam Vitals and nursing note reviewed.  Constitutional:      General: He is not in acute distress.    Appearance: He is well-developed. He is not ill-appearing.  HENT:     Head: Normocephalic and atraumatic.     Mouth/Throat:     Mouth: Mucous membranes are moist.  Eyes:      Conjunctiva/sclera: Conjunctivae normal.     Pupils: Pupils are equal, round, and reactive to light.  Cardiovascular:     Rate and Rhythm: Normal rate and regular rhythm.     Pulses: Normal pulses.     Heart sounds: Normal heart sounds. No murmur heard. Pulmonary:     Effort: Tachypnea (mild) present. No respiratory distress.     Breath sounds: Wheezing present.  Abdominal:     Palpations: Abdomen is soft.     Tenderness: There is no abdominal tenderness.  Musculoskeletal:        General: Normal range of motion.     Cervical back: Normal range of motion and neck supple.     Right lower leg: No edema.  Left lower leg: No edema.  Skin:    General: Skin is warm and dry.     Capillary Refill: Capillary refill takes less than 2 seconds.  Neurological:     General: No focal deficit present.     Mental Status: He is alert.    ED Results / Procedures / Treatments   Labs (all labs ordered are listed, but only abnormal results are displayed) Labs Reviewed  CBC WITH DIFFERENTIAL/PLATELET - Abnormal; Notable for the following components:      Result Value   RBC 4.03 (*)    Hemoglobin 12.9 (*)    HCT 37.9 (*)    All other components within normal limits  BASIC METABOLIC PANEL - Abnormal; Notable for the following components:   Glucose, Bld 110 (*)    All other components within normal limits  RESP PANEL BY RT-PCR (FLU A&B, COVID) [id number]    EKG EKG Interpretation  Date/Time:  Sunday July 03 2021 08:35:59 EDT Ventricular Rate:  97 PR Interval:  169 QRS Duration: 73 QT Interval:  336 QTC Calculation: 427 R Axis:   67 Text Interpretation: Sinus rhythm Anteroseptal infarct, old Confirmed by Lennice Sites [phone]) on 07/03/2021 8:39:16 AM  Radiology DG Chest Portable 1 View  Result Date: 07/03/2021 CLINICAL DATA:  70 year old male with shortness of breath. Out of albuterol. EXAM: PORTABLE CHEST 1 VIEW COMPARISON:  Outside Chest CT 04/20/2021 and earlier. FINDINGS: Portable AP  semi upright views at 0841 hours. Stable cardiac size and mediastinal contours. Visualized tracheal air column is within normal limits. Chronic anterior chest wall cardiac event recorder. Mildly rotated, anterior junction line displaced to the right. Large lung volumes. Medial left upper lobe lung nodule seen on prior CT and PET-CT is not evident with portable technique. Allowing for portable technique the lungs are clear. No pneumothorax or pleural effusion. No acute osseous abnormality identified. IMPRESSION: Emphysema (ICD10-J43.9).  No acute cardiopulmonary abnormality. Electronically Signed   By: Genevie Ann M.D.   On: 07/03/2021 09:04    Procedures Procedures   Medications Ordered in ED Medications  magnesium sulfate IVPB 1 g 100 mL (1 g Intravenous New Bag/Given 07/03/21 0938)  methylPREDNISolone sodium succinate (SOLU-MEDROL) 125 mg/2 mL injection 125 mg (125 mg Intravenous Given 07/03/21 0849)  albuterol (VENTOLIN HFA) 108 (90 Base) MCG/ACT inhaler 6 puff (6 puffs Inhalation Given 07/03/21 0849)  ipratropium (ATROVENT HFA) inhaler 2 puff (2 puffs Inhalation Given 07/03/21 7782)    ED Course  I have reviewed the triage vital signs and the nursing notes.  Pertinent labs & imaging results that were available during my care of the patient were reviewed by me and considered in my medical decision making (see chart for details).    MDM Rules/Calculators/A&P                           9:54 AM  [place] is here with shortness of breath.  History of COPD, hypertension.  Shortness of breath for 1 week.  Wheezing on exam with mild increased work of breathing.  Has been using inhaler at home with some relief.  Denies any infectious symptoms but has had a cough.  No fever, no cough.  Vital signs overall unremarkable.  EKG shows sinus rhythm.  No ischemic changes.  Not having any chest pain.  Doubt ACS.  No PE risk factors.  Overall suspect COPD exacerbation.  Less likely PE or ACS.  Will evaluate for  pneumonia,  COVID.  Will check basic labs.  Will give breathing treatment, IV steroids, IV magnesium and reevaluate.  COVID test negative.  Chest x-ray negative for infection.  Feeling better after breathing treatments and steroids.  Normal labs otherwise.  Overall suspect COPD exacerbation.  Will write for steroids for home and continued breathing treatments at home.  Understands return precautions.  No evidence of hypoxia or severe respiratory distress.  This chart was dictated using voice recognition software.  Despite best efforts to proofread,  errors can occur which can change the documentation meaning.  Final Clinical Impression(s) / ED Diagnoses Final diagnoses:  COPD exacerbation ([place])    Rx / DC Orders ED Discharge Orders          Ordered    predniSONE (DELTASONE) 20 MG tablet  Daily        07/03/21 [place], Tiarah Shisler, DO 07/03/21 1019

## 2021-07-05 ENCOUNTER — Encounter: Attending: Family Medicine | Primary: Family Medicine

## 2021-07-08 ENCOUNTER — Encounter: Payer: Self-pay | Admitting: Pulmonary Disease

## 2021-07-08 ENCOUNTER — Telehealth: Payer: Self-pay | Admitting: Pulmonary Disease

## 2021-07-08 ENCOUNTER — Ambulatory Visit (INDEPENDENT_AMBULATORY_CARE_PROVIDER_SITE_OTHER): Payer: No Typology Code available for payment source | Admitting: Pulmonary Disease

## 2021-07-08 ENCOUNTER — Ambulatory Visit
Admission: RE | Admit: 2021-07-08 | Discharge: 2021-07-08 | Disposition: A | Discharge status: Home / Self Care | Payer: Self-pay | Source: Ambulatory Visit | Attending: Pulmonary Disease | Admitting: Pulmonary Disease

## 2021-07-08 ENCOUNTER — Other Ambulatory Visit: Payer: Self-pay

## 2021-07-08 VITALS — BP 144/84 | HR 81 | Temp 99.5°F | Ht 70.0 in | Wt 191.0 lb

## 2021-07-08 DIAGNOSIS — R911 Solitary pulmonary nodule: Secondary | ICD-10-CM

## 2021-07-08 DIAGNOSIS — J432 Centrilobular emphysema: Secondary | ICD-10-CM

## 2021-07-08 DIAGNOSIS — Z87891 Personal history of nicotine dependence: Secondary | ICD-10-CM

## 2021-07-08 MED ORDER — ALBUTEROL SULFATE (2.5 MG/3ML) 0.083% IN NEBU: 2.5000 mg | INHALATION_SOLUTION | Freq: Four times a day (QID) | RESPIRATORY_TRACT | 12 refills | Status: DC | PRN

## 2021-07-08 NOTE — Addendum Note (Signed)
Addended by: Valerie Salts on: 07/08/2021 02:13 PM   Modules accepted: Orders

## 2021-07-08 NOTE — Patient Instructions (Signed)
Thank you for visiting Dr. Valeta Harms at [facility]. Today we recommend the following:  Orders Placed This Encounter  Procedures   CT Super D Chest Wo Contrast   NM PET Image Initial (PI) Skull Base To Thigh (F-18 FDG)   Ambulatory referral to Cardiothoracic Surgery   Pulmonary Function Test   PET PFTs prior seeing Dr. Kipp Brood.  CT imaging to be loaded up in PACs   Return in about 8 weeks (around 09/02/2021) for with APP or Dr. Valeta Harms.    Please do your part to reduce the spread of COVID-19.

## 2021-07-08 NOTE — Telephone Encounter (Signed)
Completed and faxed a [facility] Request form for patient's return visit, PFT and referral to [facility]. Will keep form and referral packet in BI's cubby for reference.

## 2021-07-08 NOTE — Progress Notes (Signed)
Synopsis: Referred in September 2022 for lung nodule by Wenda Low, MD  Subjective:   PATIENT ID: Bruce Sullivan GENDER: male DOB: [birth year], MRN: [id number]  Chief Complaint  Patient presents with   Consult    Pt states here to ask questions about biopsy.     This is a 70 year old gentleman, history of COPD, chronic kidney disease, hypertension, hyperlipidemia, TIA, stroke.  Presents for evaluation of lung nodule. Patient had a nuclear medicine PET scan in March 2022 which revealed a 11 x 7 x 8 mm left upper lobe pleural-based nodule that had an SUV of 2.  Recommended at the time close CT surveillance follow-up.    Past Medical History:  Diagnosis Date   Anxiety    Asthma    Atherosclerosis 01/16/2015   Noted on CXR   Bilateral lower extremity edema 01/16/2015   Chronic kidney disease (CKD), stage II (mild)    COPD (chronic obstructive pulmonary disease) (HCC)    Depression    GERD (gastroesophageal reflux disease)    Gout    History of colon polyps    Hoarseness    Hyperlipidemia    Hypertension    Neck abscess 09/03/2016   Pulmonary emphysema ([place])    Seizure ([place])    Stroke [facility]) 2014   Thyroid disease    TIA (transient ischemic attack)    Urinary frequency    Urine retention    UTI (urinary tract infection)      Family History  Problem Relation Age of Onset   Liver cancer Neg Hx    Liver disease Neg Hx      Past Surgical History:  Procedure Laterality Date   COLONOSCOPY  08/29/2018   CYSTOSCOPY WITH BIOPSY N/A 11/20/2018   Procedure: CYSTOSCOPY WITH BLADDER BIOPSY;  Surgeon: Ceasar Mons, MD;  Location: [facility];  Service: Urology;  Laterality: N/A;   HERNIA REPAIR     t monitor     TRANSURETHRAL RESECTION OF PROSTATE N/A 11/20/2018   Procedure: TRANSURETHRAL RESECTION OF THE PROSTATE (TURP);  Surgeon: Ceasar Mons, MD;  Location: [facility];  Service: Urology;  Laterality: N/A;    Social  History   Socioeconomic History   Marital status: Married    Spouse name: Not on file   Number of children: Not on file   Years of education: Not on file   Highest education level: Not on file  Occupational History   Not on file  Tobacco Use   Smoking status: Former   Smokeless tobacco: Never  Vaping Use   Vaping Use: Never used  Substance and Sexual Activity   Alcohol use: No   Drug use: No   Sexual activity: Not on file  Other Topics Concern   Not on file  Social History Narrative   Not on file   Social Determinants of Health   Financial Resource Strain: Not on file  Food Insecurity: Not on file  Transportation Needs: Not on file  Physical Activity: Not on file  Stress: Not on file  Social Connections: Not on file  Intimate Partner Violence: Not on file     Allergies  Allergen Reactions   Bee Venom Anaphylaxis    Face gets red and hard to breathe      Outpatient Medications Prior to Visit  Medication Sig Dispense Refill   albuterol (VENTOLIN HFA) 108 (90 Base) MCG/ACT inhaler Inhale 1-2 puffs into the lungs every 6 (six) hours as needed for wheezing  or shortness of breath. 1 each 0   allopurinol (ZYLOPRIM) 100 MG tablet Take 100 mg by mouth 2 (two) times daily.     aspirin EC 81 MG tablet Take 81 mg by mouth daily. Swallow whole.     atorvastatin (LIPITOR) 10 MG tablet Take 10 mg by mouth at bedtime.     budesonide-formoterol (SYMBICORT) 80-4.5 MCG/ACT inhaler Inhale 2 puffs into the lungs 2 (two) times daily.     chlorthalidone (HYGROTON) 50 MG tablet Take 50 mg by mouth daily.     FLUoxetine (PROZAC) 20 MG capsule Take 20 mg by mouth daily.     HYDROcodone-acetaminophen (NORCO) 5-325 MG tablet Take 1 tablet by mouth every 4 (four) hours as needed for moderate pain. 15 tablet 0   mometasone-formoterol (DULERA) 200-5 MCG/ACT AERO Inhale 2 puffs into the lungs at bedtime.     omeprazole (PRILOSEC) 20 MG capsule Take 20 mg by mouth daily.     predniSONE  (DELTASONE) 20 MG tablet Take 3 tablets (60 mg total) by mouth daily for 5 days. 15 tablet 0   albuterol (VENTOLIN HFA) 108 (90 Base) MCG/ACT inhaler Inhale 2 puffs into the lungs every 6 (six) hours as needed for wheezing or shortness of breath.     No facility-administered medications prior to visit.    Review of Systems  Constitutional:  Negative for chills, fever, malaise/fatigue and weight loss.  HENT:  Negative for hearing loss, sore throat and tinnitus.   Eyes:  Negative for blurred vision and double vision.  Respiratory:  Positive for shortness of breath. Negative for cough, hemoptysis, sputum production, wheezing and stridor.   Cardiovascular:  Negative for chest pain, palpitations, orthopnea, leg swelling and PND.  Gastrointestinal:  Negative for abdominal pain, constipation, diarrhea, heartburn, nausea and vomiting.  Genitourinary:  Negative for dysuria, hematuria and urgency.  Musculoskeletal:  Negative for joint pain and myalgias.  Skin:  Negative for itching and rash.  Neurological:  Negative for dizziness, tingling, weakness and headaches.  Endo/Heme/Allergies:  Negative for environmental allergies. Does not bruise/bleed easily.  Psychiatric/Behavioral:  Negative for depression. The patient is not nervous/anxious and does not have insomnia.   All other systems reviewed and are negative.   Objective:  Physical Exam Vitals reviewed.  Constitutional:      General: He is not in acute distress.    Appearance: He is well-developed.  HENT:     Head: Normocephalic and atraumatic.  Eyes:     General: No scleral icterus.    Conjunctiva/sclera: Conjunctivae normal.     Pupils: Pupils are equal, round, and reactive to light.  Neck:     Vascular: No JVD.     Trachea: No tracheal deviation.  Cardiovascular:     Rate and Rhythm: Normal rate and regular rhythm.     Heart sounds: Normal heart sounds. No murmur heard. Pulmonary:     Effort: Pulmonary effort is normal. No  tachypnea, accessory muscle usage or respiratory distress.     Breath sounds: No stridor. No wheezing, rhonchi or rales.  Abdominal:     General: Bowel sounds are normal. There is no distension.     Palpations: Abdomen is soft.     Tenderness: There is no abdominal tenderness.  Musculoskeletal:        General: No tenderness.     Cervical back: Neck supple.  Lymphadenopathy:     Cervical: No cervical adenopathy.  Skin:    General: Skin is warm and dry.  Capillary Refill: Capillary refill takes less than 2 seconds.     Findings: No rash.  Neurological:     Mental Status: He is alert and oriented to person, place, and time.  Psychiatric:        Behavior: Behavior normal.     Vitals:   07/08/21 1032  BP: (!) 144/84  Pulse: 81  Temp: 99.5 F (37.5 C)  TempSrc: Oral  SpO2: 97%  Weight: 191 lb (86.6 kg)  Height: 5\' 10"  (1.778 m)   97% on RA BMI Readings from Last 3 Encounters:  07/08/21 27.41 kg/m  07/03/21 27.26 kg/m  12/27/20 25.83 kg/m   Wt Readings from Last 3 Encounters:  07/08/21 191 lb (86.6 kg)  07/03/21 190 lb (86.2 kg)  12/27/20 180 lb (81.6 kg)     CBC    Component Value Date/Time   WBC 7.4 07/03/2021 0849   RBC 4.03 (L) 07/03/2021 0849   HGB 12.9 (L) 07/03/2021 0849   HCT 37.9 (L) 07/03/2021 0849   PLT 247 07/03/2021 0849   MCV 94.0 07/03/2021 0849   MCH 32.0 07/03/2021 0849   MCHC 34.0 07/03/2021 0849   RDW 12.4 07/03/2021 0849   LYMPHSABS 4.0 07/03/2021 0849   MONOABS 0.5 07/03/2021 0849   EOSABS 0.3 07/03/2021 0849   BASOSABS 0.0 07/03/2021 0849    Chest Imaging: June 2022 CT imaging: CT imaging was completed at [facility].  Patient brought disc today to the office. We reviewed imaging today in the office which shows a lobulated left upper lobe medial pleural-based lesion concerning for primary bronchogenic carcinoma. The patient's images have been independently reviewed by me.    Pulmonary Functions Testing Results: No  flowsheet data found.  FeNO:   Pathology:   Echocardiogram:   Heart Catheterization:     Assessment & Plan:     ICD-10-CM   1. Lung nodule  R91.1 Pulmonary Function Test    Ambulatory referral to Cardiothoracic Surgery    CT Super D Chest Wo Contrast    NM PET Image Initial (PI) Skull Base To Thigh (F-18 FDG)    2. Former smoker  Z87.891     3. Centrilobular emphysema ([place])  J43.2       Discussion:  This is a 70 year old gentleman, former smoker quit in 2015.  He has COPD at baseline, uses Symbicort.  Does not have a nebulizer.  He has radiographic findings of centrilobular emphysema.  He has a new left upper lobe slowly enlarging macrolobulated nodule pleural-based concerning for malignancy.  Plan: Today in the office we discussed the risk benefits and alternatives of proceeding with tissue biopsy, radiation treatments and/or consideration for surgical resection. I think due to the patient's age with a good functional status and the fact that he is quit smoking several years ago that he may be a good candidate for surgical resection. I will send patient's imaging and referral to cardiothoracic surgery. Potential case for combination RAB, fiducial dye marking +RAT w/ lobectomy  Patient is agreeable to this plan. I will discuss case with Dr. Kipp Brood. Hopefully we get the PET scan and PFTs completed prior to surgical consultation.   Current Outpatient Medications:    albuterol (VENTOLIN HFA) 108 (90 Base) MCG/ACT inhaler, Inhale 1-2 puffs into the lungs every 6 (six) hours as needed for wheezing or shortness of breath., Disp: 1 each, Rfl: 0   allopurinol (ZYLOPRIM) 100 MG tablet, Take 100 mg by mouth 2 (two) times daily., Disp: , Rfl:  aspirin EC 81 MG tablet, Take 81 mg by mouth daily. Swallow whole., Disp: , Rfl:    atorvastatin (LIPITOR) 10 MG tablet, Take 10 mg by mouth at bedtime., Disp: , Rfl:    budesonide-formoterol (SYMBICORT) 80-4.5 MCG/ACT inhaler, Inhale 2  puffs into the lungs 2 (two) times daily., Disp: , Rfl:    chlorthalidone (HYGROTON) 50 MG tablet, Take 50 mg by mouth daily., Disp: , Rfl:    FLUoxetine (PROZAC) 20 MG capsule, Take 20 mg by mouth daily., Disp: , Rfl:    HYDROcodone-acetaminophen (NORCO) 5-325 MG tablet, Take 1 tablet by mouth every 4 (four) hours as needed for moderate pain., Disp: 15 tablet, Rfl: 0   mometasone-formoterol (DULERA) 200-5 MCG/ACT AERO, Inhale 2 puffs into the lungs at bedtime., Disp: , Rfl:    omeprazole (PRILOSEC) 20 MG capsule, Take 20 mg by mouth daily., Disp: , Rfl:    predniSONE (DELTASONE) 20 MG tablet, Take 3 tablets (60 mg total) by mouth daily for 5 days., Disp: 15 tablet, Rfl: 0  I spent 62 minutes dedicated to the care of this patient on the date of this encounter to include pre-visit review of records, face-to-face time with the patient discussing conditions above, post visit ordering of testing, clinical documentation with the electronic health record, making appropriate referrals as documented, and communicating necessary findings to members of the patients care team.   Garner Nash, DO [facility] 07/08/2021 11:03 AM

## 2021-07-13 ENCOUNTER — Other Ambulatory Visit: Payer: Self-pay

## 2021-07-13 ENCOUNTER — Ambulatory Visit (INDEPENDENT_AMBULATORY_CARE_PROVIDER_SITE_OTHER): Payer: No Typology Code available for payment source | Admitting: Pulmonary Disease

## 2021-07-13 DIAGNOSIS — R911 Solitary pulmonary nodule: Secondary | ICD-10-CM | POA: Diagnosis not present

## 2021-07-13 LAB — PULMONARY FUNCTION TEST
DL/VA % pred: 79 %
DL/VA: 3.23 ml/min/mmHg/L
DLCO cor % pred: 58 %
DLCO cor: 14.75 ml/min/mmHg
DLCO unc % pred: 55 %
DLCO unc: 13.99 ml/min/mmHg
FEF 25-75 Post: 0.78 L/sec
FEF 25-75 Pre: 0.34 L/sec
FEF2575-%Change-Post: 128 %
FEF2575-%Pred-Post: 32 %
FEF2575-%Pred-Pre: 14 %
FEV1-%Change-Post: 47 %
FEV1-%Pred-Post: 46 %
FEV1-%Pred-Pre: 31 %
FEV1-Post: 1.31 L
FEV1-Pre: 0.89 L
FEV1FVC-%Change-Post: 17 %
FEV1FVC-%Pred-Pre: 55 %
FEV6-%Change-Post: 25 %
FEV6-%Pred-Post: 71 %
FEV6-%Pred-Pre: 56 %
FEV6-Post: 2.52 L
FEV6-Pre: 2 L
FEV6FVC-%Change-Post: 0 %
FEV6FVC-%Pred-Post: 100 %
FEV6FVC-%Pred-Pre: 100 %
FVC-%Change-Post: 25 %
FVC-%Pred-Post: 70 %
FVC-%Pred-Pre: 56 %
FVC-Post: 2.62 L
FVC-Pre: 2.09 L
Post FEV1/FVC ratio: 50 %
Post FEV6/FVC ratio: 96 %
Pre FEV1/FVC ratio: 43 %
Pre FEV6/FVC Ratio: 96 %
RV % pred: 224 %
RV: 5.33 L
TLC % pred: 109 %
TLC: 7.44 L

## 2021-07-13 NOTE — Progress Notes (Signed)
PFT done today. 

## 2021-07-14 DIAGNOSIS — R972 Elevated prostate specific antigen [PSA]: Secondary | ICD-10-CM | POA: Diagnosis not present

## 2021-07-19 ENCOUNTER — Telehealth: Payer: Self-pay | Admitting: Pulmonary Disease

## 2021-07-19 NOTE — Telephone Encounter (Signed)
Called back to [facility] received auth# for chest ct and pet [phone] [id number] Joellen [place]

## 2021-07-20 ENCOUNTER — Telehealth: Payer: Self-pay | Admitting: Pulmonary Disease

## 2021-07-20 NOTE — Telephone Encounter (Signed)
[facility] in regards [facility]'s [facility].

## 2021-07-26 ENCOUNTER — Encounter (HOSPITAL_COMMUNITY)
Admission: RE | Admit: 2021-07-26 | Discharge: 2021-07-26 | Disposition: A | Discharge status: Home / Self Care | Payer: No Typology Code available for payment source | Source: Ambulatory Visit | Attending: Pulmonary Disease | Admitting: Pulmonary Disease

## 2021-07-26 ENCOUNTER — Other Ambulatory Visit: Payer: Self-pay

## 2021-07-26 ENCOUNTER — Ambulatory Visit (HOSPITAL_COMMUNITY)
Admission: RE | Admit: 2021-07-26 | Discharge: 2021-07-26 | Disposition: A | Discharge status: Home / Self Care | Payer: No Typology Code available for payment source | Source: Ambulatory Visit | Attending: Pulmonary Disease | Admitting: Pulmonary Disease

## 2021-07-26 DIAGNOSIS — R911 Solitary pulmonary nodule: Secondary | ICD-10-CM

## 2021-07-26 LAB — GLUCOSE, CAPILLARY: Glucose-Capillary: 114 mg/dL — ABNORMAL HIGH (ref 70–99)

## 2021-07-26 IMAGING — CT NM PET TUM IMG INITIAL (PI) SKULL BASE T - THIGH
7 series · 25 of 25 positions shown · non-contrast
Comparison: PET-CT scan [DATE]

CLINICAL DATA: Subsequent treatment strategy for LEFT upper lobe
pulmonary nodule.

EXAM:
NUCLEAR MEDICINE PET SKULL BASE TO THIGH
TECHNIQUE: 9.5 mCi F-18 FDG was injected intravenously. Full-ring PET imaging
was performed from the skull base to thigh after the radiotracer. CT
data was obtained and used for attenuation correction and anatomic
localization.
Fasting blood glucose: 114 mg/dl

[Series 3: pet sk_thigh ac · axial · 5.0mm · 4.07mm/px · z∈[-622,+334]mm · 6 of 240 slices shown]
[im 1/240]
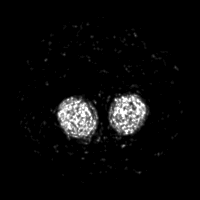
[im 48/240]
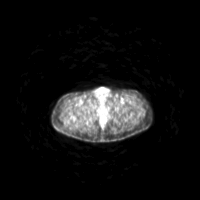
[im 96/240]
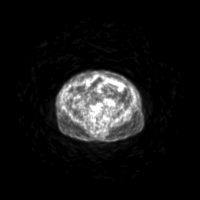
[im 144/240]
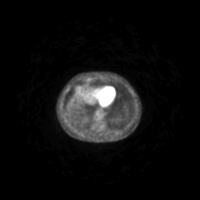
[im 192/240]
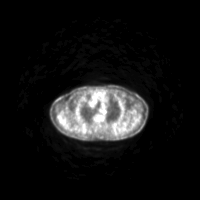
[im 240/240]
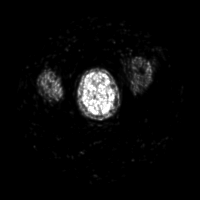

[Series 4: ct sk_thigh 5.0 bf37 · axial · 5.0mm · 0.98mm/px · z∈[-622,+334]mm · 5 of 240 slices shown]
[im 1/240]
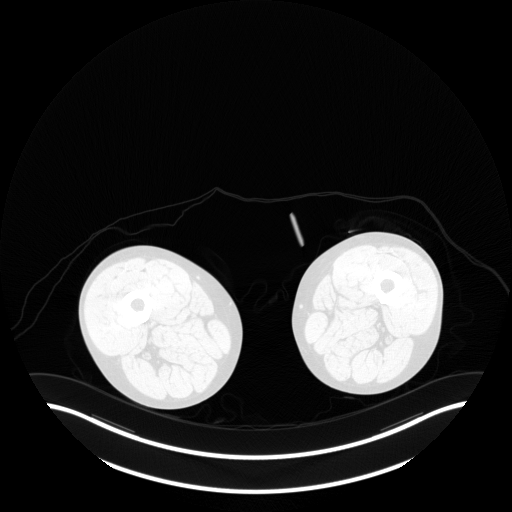
[im 60/240]
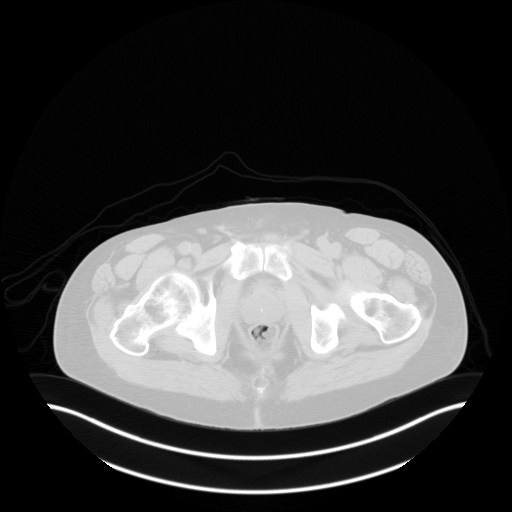
[im 120/240]
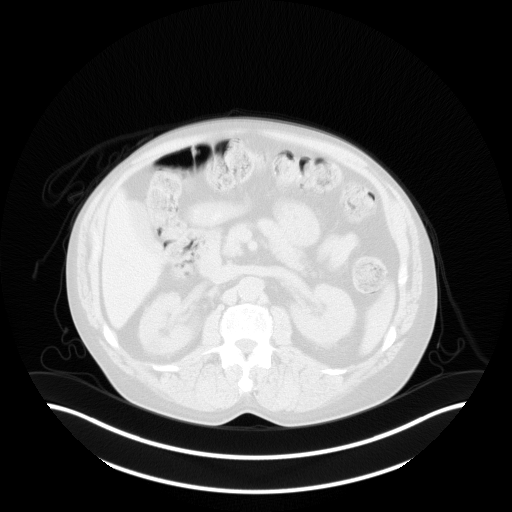
[im 180/240]
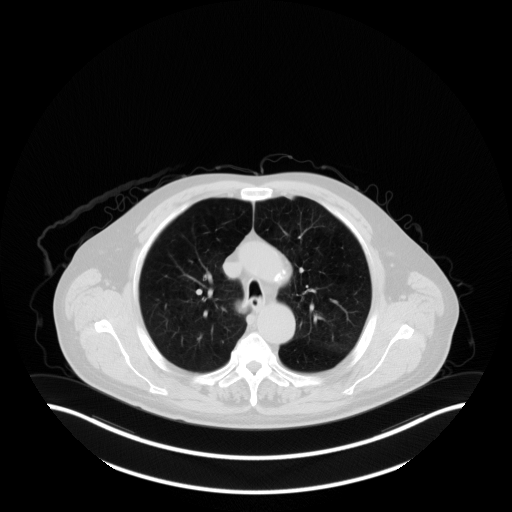
[im 240/240  brain]
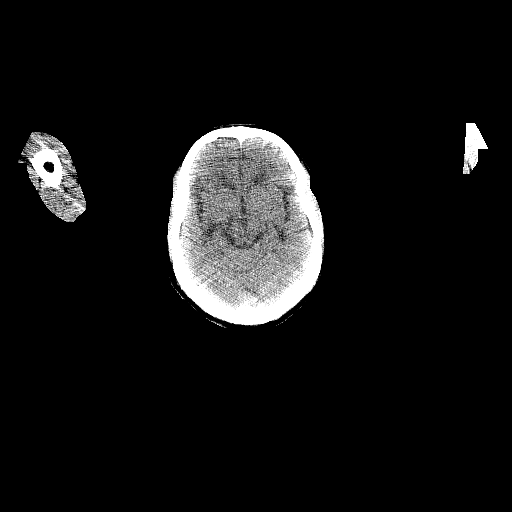

[Series 5: pet sk_thigh nac · axial · 5.0mm · 4.07mm/px · z∈[-622,+334]mm · 5 of 240 slices shown]
[im 1/240]
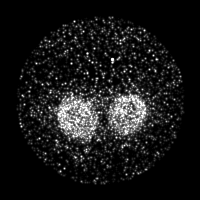
[im 60/240]
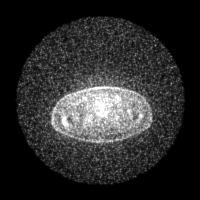
[im 120/240]
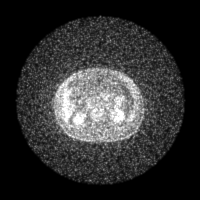
[im 180/240]
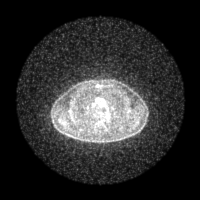
[im 240/240]
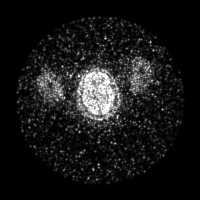

[Series 8: ct sk_thigh 5.0 br59 lung_bone · axial · 5.0mm · 0.70mm/px · z∈[-106,+182]mm · 2 of 73 slices shown]
[im 1/73]
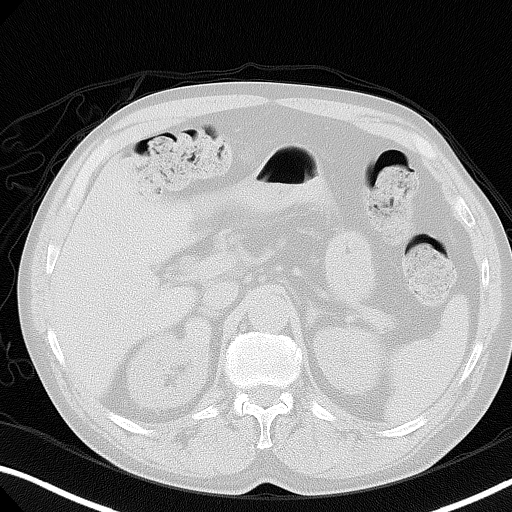
[im 73/73]
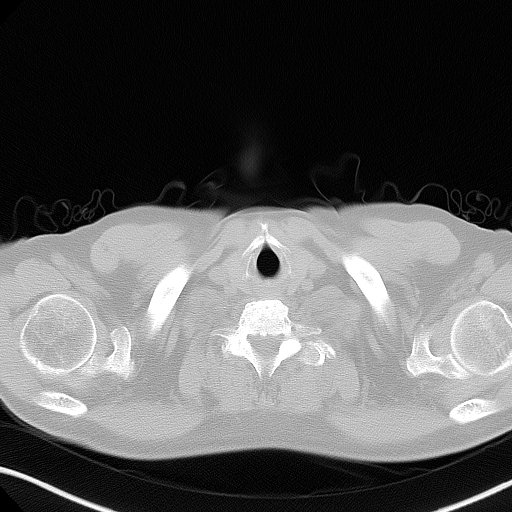

[Series 603: fused cor · 1 of 37 slices shown]
[im 1/37]
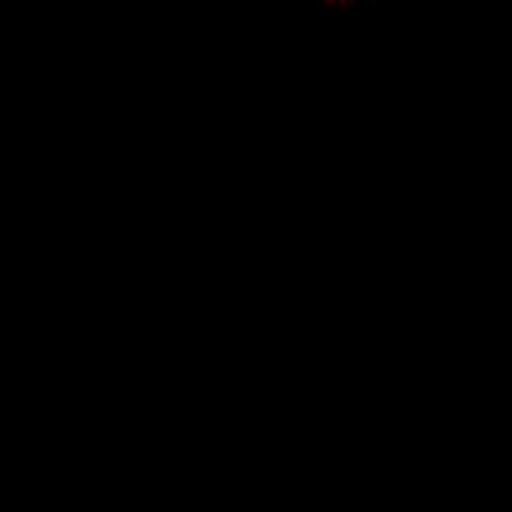

[Series 604: <mip collection> · coronal · 1.98mm/px · 1 of 32 slices shown]
[im 1/32]
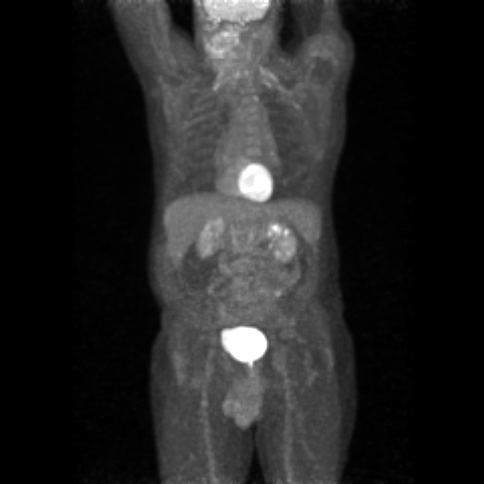

[Series 605: range-ct sk_thigh 5.0 bf37-tra-<alpha range> · 5 of 231 slices shown]
[im 1/231]
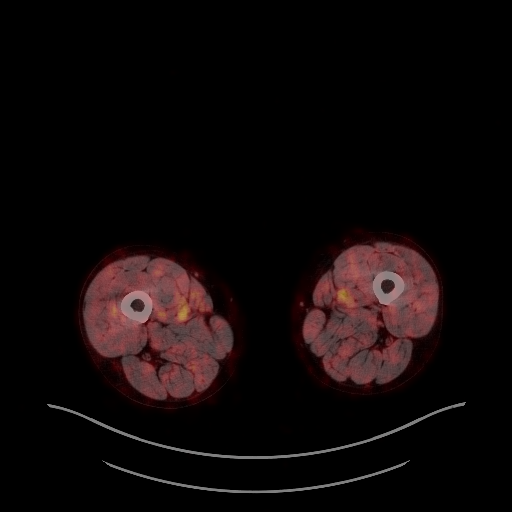
[im 58/231]
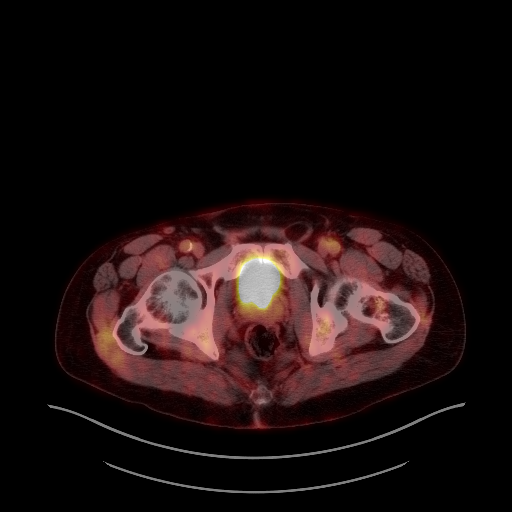
[im 116/231]
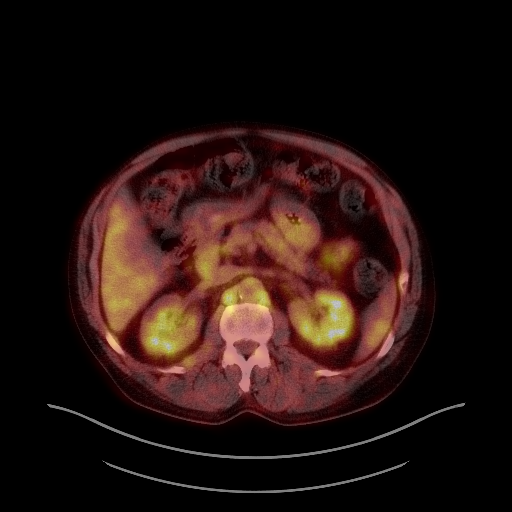
[im 173/231]
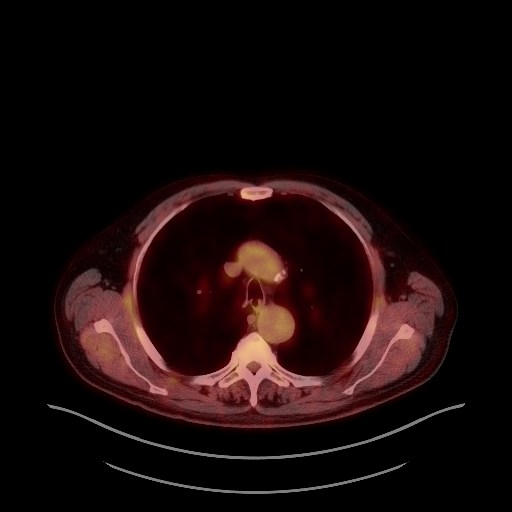
[im 231/231]
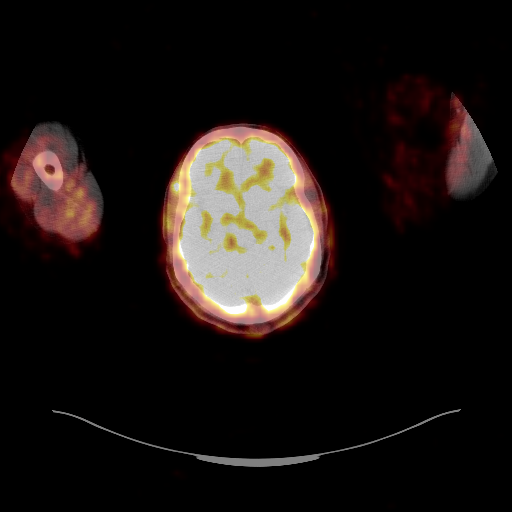

[25 of 25 positions shown; findings below may reference images not displayed]

FINDINGS: Mediastinal blood pool activity: SUV max

Liver activity: SUV max NA

NECK: No hypermetabolic lymph nodes in the neck.

Incidental CT findings: none

CHEST: The pleural base nodule in the medial LEFT upper lobe
measures 1.5 by 1.1 cm (image series 7) which compares to 1. x 1
cm on comparison PET-CT scan. Lesion is increased in metabolic
activity with SUV max equal 4.9 (image 49) compared SUV max equal
1.9

No additional hypermetabolic or enlarged pulmonary nodules.

No hypermetabolic mediastinal lymph nodes.

Incidental CT findings: Coronary artery calcification and aortic
atherosclerotic calcification.

ABDOMEN/PELVIS: No abnormal hypermetabolic activity within the
liver, pancreas, adrenal glands, or spleen. No hypermetabolic lymph
nodes in the abdomen or pelvis.

Incidental CT findings: none

SKELETON: No focal hypermetabolic activity to suggest skeletal
metastasis.

Incidental CT findings: none
IMPRESSION: 1. Enlarging hypermetabolic pulmonary nodule in the medial LEFT
upper lobe is most concerning for bronchogenic carcinoma.
2. No evidence of metastatic mediastinal lymphadenopathy or distant
metastatic disease

## 2021-07-26 IMAGING — CT CT CHEST SUPER D W/O CM
2 of 4 series · 15 of 36 positions shown, 18 images · non-contrast
Comparison: PET-CT scan same day, PET-CT scan [DATE]

CLINICAL DATA: Indeterminate LEFT upper lobe nodule on FDG PET scan

EXAM:
CT CHEST WITHOUT CONTRAST
TECHNIQUE: Multidetector CT imaging of the chest was performed using thin slice
collimation for electromagnetic bronchoscopy planning purposes,
without intravenous contrast.

[Series 3: thins · axial · 0.77mm/px · z∈[+1326,+1644]mm · 12 of 702 slices shown, 15 images]
[im 34/702  mediastinal]
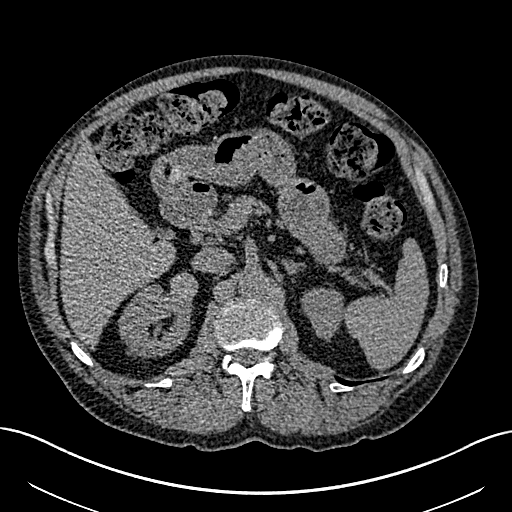
[im 34/702  lung]
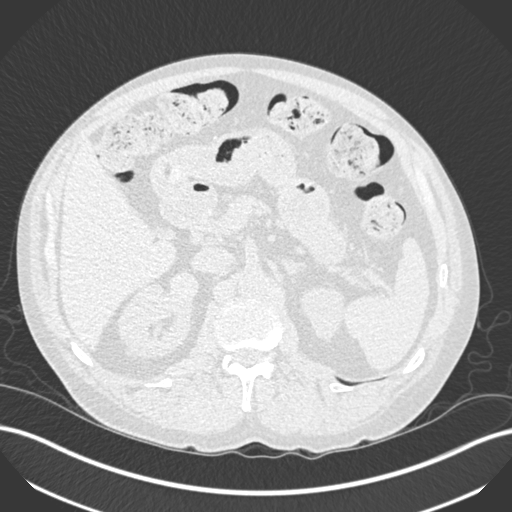
[im 101/702  lung]
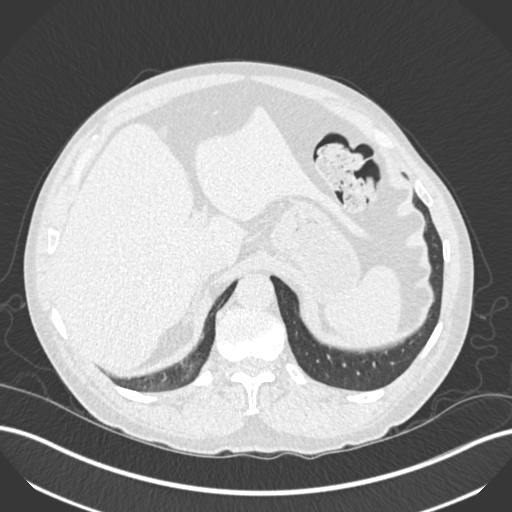
[im 167/702  lung]
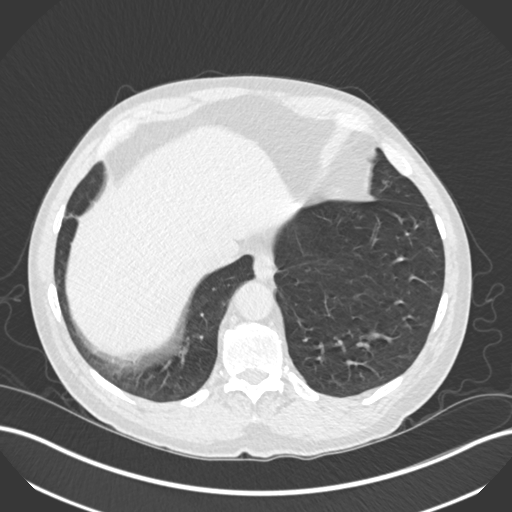
[im 201/702  lung]
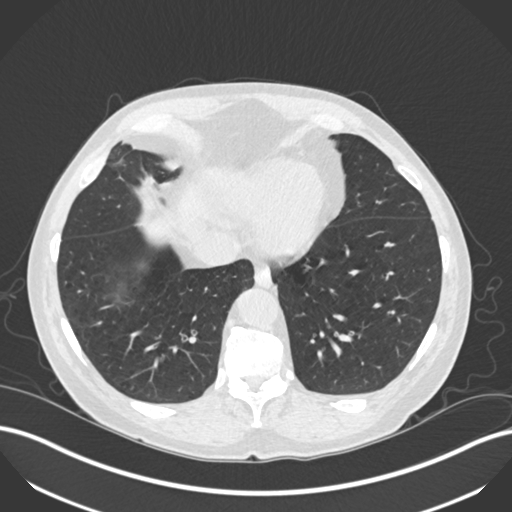
[im 268/702  mediastinal]
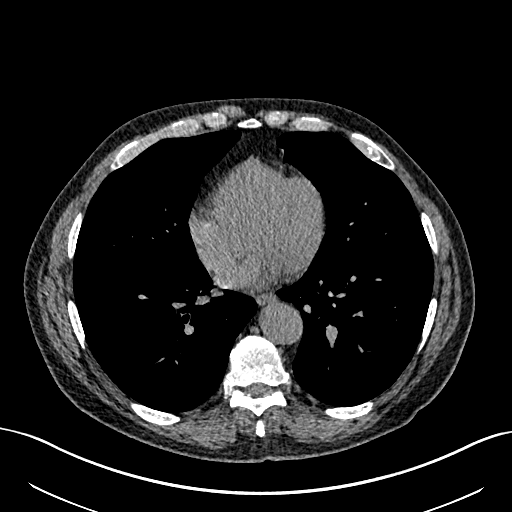
[im 268/702  lung]
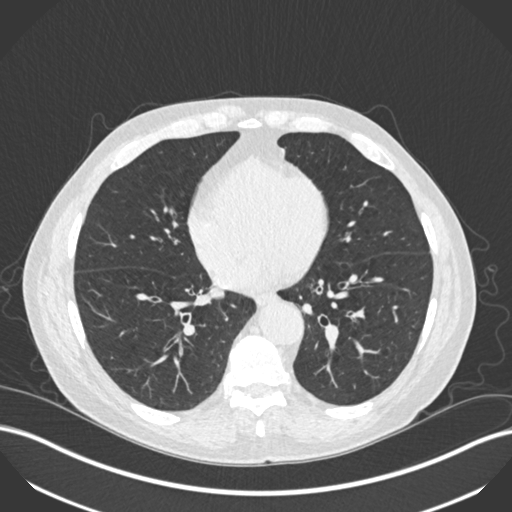
[im 334/702  lung]
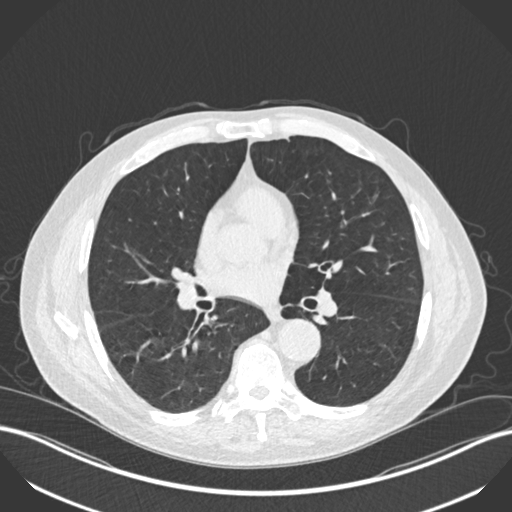
[im 368/702  lung]
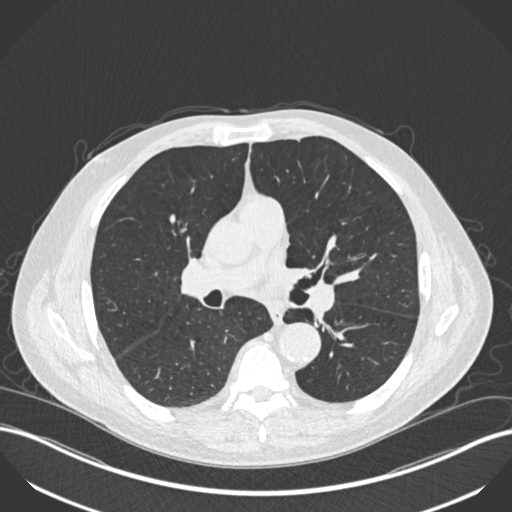
[im 434/702  lung]
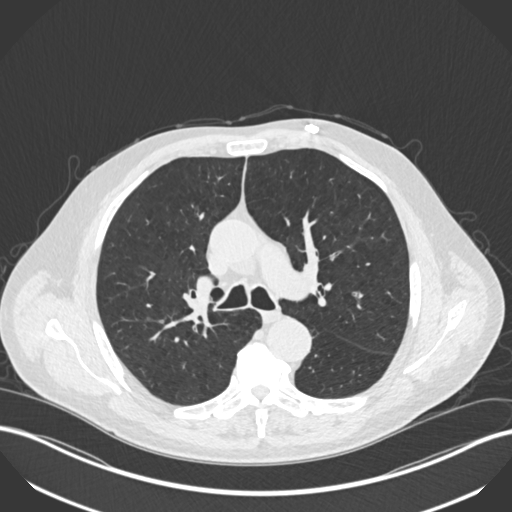
[im 501/702  mediastinal]
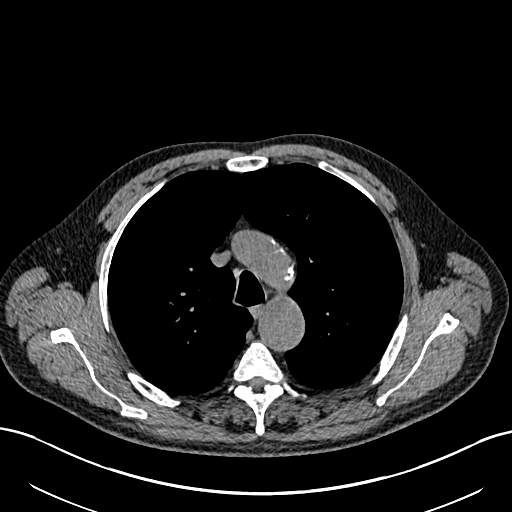
[im 501/702  lung]
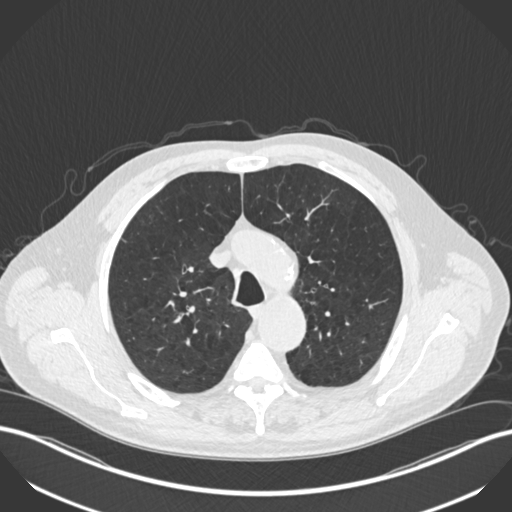
[im 535/702  lung]
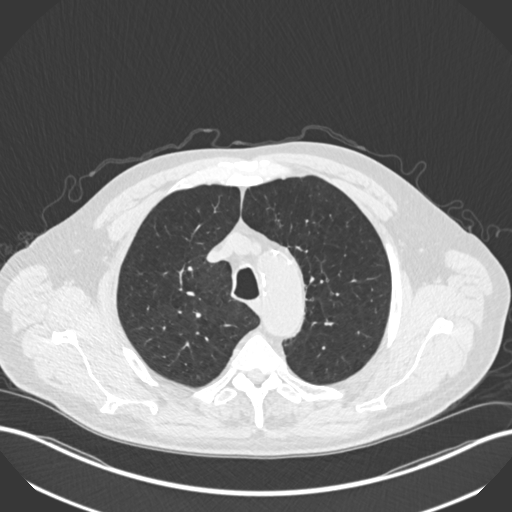
[im 601/702  lung]
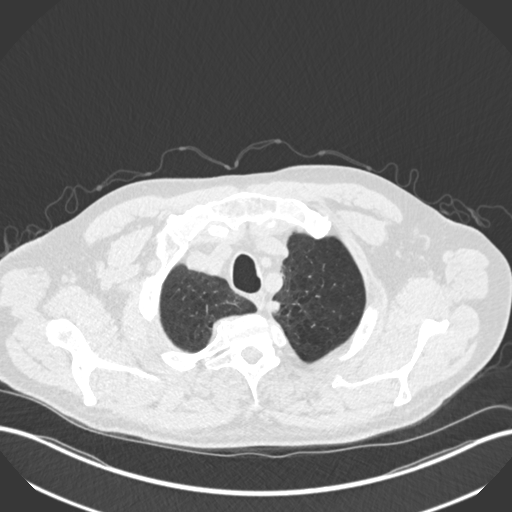
[im 668/702  lung]
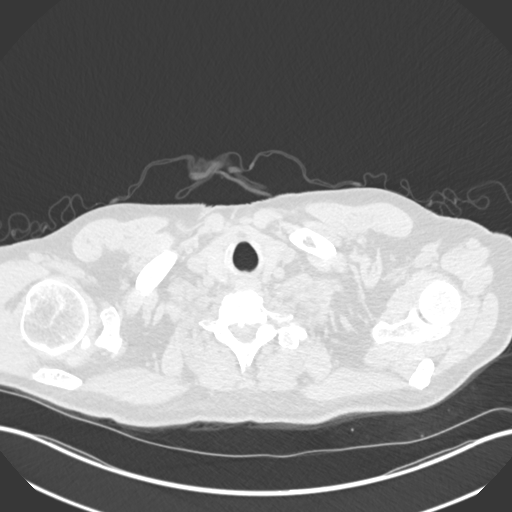

[Series 4: coronal · coronal · 0.70mm/px · 3 of 150 slices shown]
[im 30/150  lung]
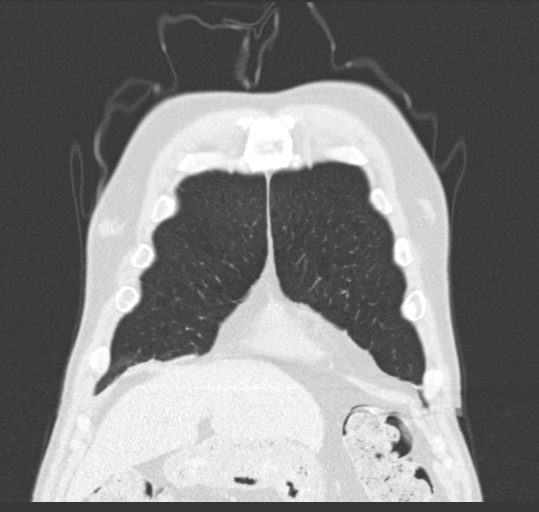
[im 60/150  lung]
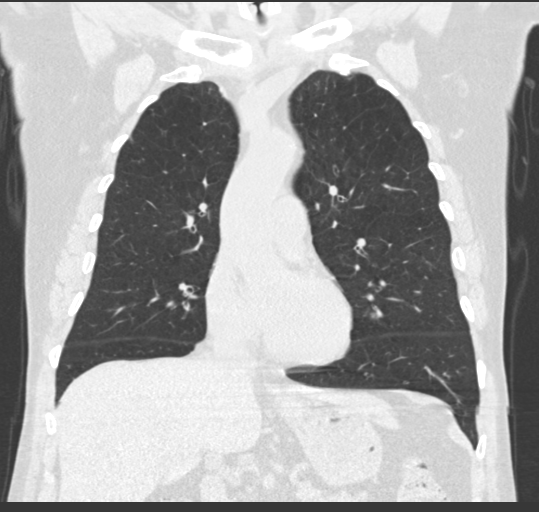
[im 90/150  lung]
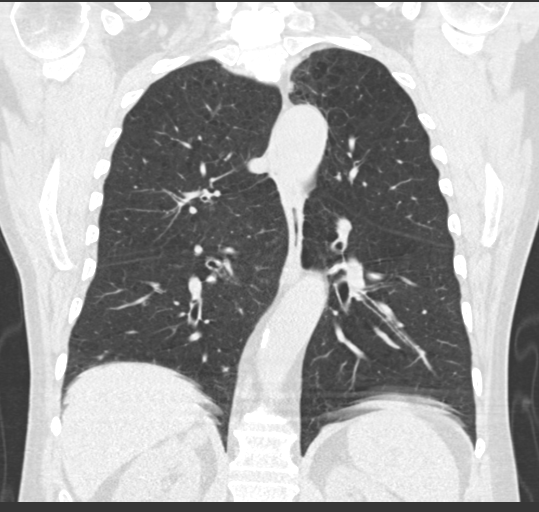

[15 of 36 positions shown; findings below may reference images not displayed]

FINDINGS: Cardiovascular: Coronary artery calcification and aortic
atherosclerotic calcification.

Mediastinum/Nodes: No axillary or supraclavicular adenopathy. No
mediastinal or hilar adenopathy. No pericardial fluid. Esophagus
normal.

Lungs/Pleura: LEFT upper lobe pulmonary nodule of concern measures
15 mm x 11 mm (image 29/series 7) compared to 11 mm x 8 mm on PET-CT
scan [DATE]. This nodule was hypermetabolic on same day PET-CT
scan.

No additional pulmonary nodules. Mild centrilobular emphysema the
upper lobes.

Upper Abdomen: Limited view of the liver, kidneys, pancreas are
unremarkable. Normal adrenal glands.

Hyperdense cysts extends to the upper pole of the RIGHT kidney
measuring 20 mm. Lesion has homogeneous high signal intensity on
noncontrast imaging and no metabolic activity on comparison same-day
PET-CT scan and favored benign proteinaceous cysts. Second smaller
RIGHT upper pole high-density cyst on image 155/2

Musculoskeletal: No chest wall mass or suspicious bone lesions
identified.
IMPRESSION: 1. Enlarging nodule of concern in the medial LEFT upper lobe is
hypermetabolic on same day PET-CT scan and is highly concerning for
bronchogenic carcinoma.
2. No evidence metastatic lymphadenopathy.
3. High-density cysts of the RIGHT kidney are favored benign
proteinaceous cyst (Bosniak 2).

## 2021-07-26 MED ORDER — FLUDEOXYGLUCOSE F - 18 (FDG) INJECTION
9.5000 | Freq: Once | INTRAVENOUS | Status: AC
  Administered 2021-07-26: 9.5 via INTRAVENOUS

## 2021-07-27 ENCOUNTER — Telehealth: Payer: Self-pay | Admitting: Pulmonary Disease

## 2021-07-27 DIAGNOSIS — J432 Centrilobular emphysema: Secondary | ICD-10-CM

## 2021-07-27 MED ORDER — ALBUTEROL SULFATE (2.5 MG/3ML) 0.083% IN NEBU: 2.5000 mg | INHALATION_SOLUTION | Freq: Four times a day (QID) | RESPIRATORY_TRACT | 12 refills | Status: DC | PRN

## 2021-07-27 NOTE — Telephone Encounter (Signed)
I have called the pts wife and she is aware of the order for the nebulizer meds have been sent to the [facility] in [place].  Order for nebulizer machine has been placed as well and will need to be sent to the [facility] in [place]

## 2021-08-02 NOTE — Progress Notes (Signed)
[address]       [place] [address]             [phone]                    Bruce Sullivan [place] Medical Record #[id number] Date of Birth: 1951-04-21  Referring: Garner Nash, DO Primary Care: Wenda Low, MD Primary Cardiologist: None  Chief Complaint:    Chief Complaint  Patient presents with   Lung Mass    Initial surgical consult, PET 9/20, PFT 9/7, CT super D 9/20    History of Present Illness:    Bruce Sullivan 70 y.o. male referred by Dr. Valeta Harms for surgical evaluation of an 1.1 cm left upper lobe pleural nodule.  He is a former smoker who quit tobacco use in 2015.  He is however very dyspneic with minimal exertion.  He is unable to walk around his house without getting short of breath.       Zubrod Score: At the time of surgery this patient's most appropriate activity status/level should be described as: []     0    Normal activity, no symptoms []     1    Restricted in physical strenuous activity but ambulatory, able to do out light work [x]     2    Ambulatory and capable of self care, unable to do work activities, up and about               >50 % of waking hours                              []     3    Only limited self care, in bed greater than 50% of waking hours []     4    Completely disabled, no self care, confined to bed or chair []     5    Moribund   Past Medical History:  Diagnosis Date   Anxiety    Asthma    Atherosclerosis 01/16/2015   Noted on CXR   Bilateral lower extremity edema 01/16/2015   Chronic kidney disease (CKD), stage II (mild)    COPD (chronic obstructive pulmonary disease) (HCC)    Depression    GERD (gastroesophageal reflux disease)    Gout    History of colon polyps    Hoarseness    Hyperlipidemia    Hypertension    Neck abscess 09/03/2016   Pulmonary emphysema ([place])    Seizure ([place])    Stroke ([place]) 2014   Thyroid disease    TIA (transient ischemic attack)    Urinary frequency    Urine  retention    UTI (urinary tract infection)     Past Surgical History:  Procedure Laterality Date   COLONOSCOPY  08/29/2018   CYSTOSCOPY WITH BIOPSY N/A 11/20/2018   Procedure: CYSTOSCOPY WITH BLADDER BIOPSY;  Surgeon: Ceasar Mons, MD;  Location: [facility];  Service: Urology;  Laterality: N/A;   HERNIA REPAIR     t monitor     TRANSURETHRAL RESECTION OF PROSTATE N/A 11/20/2018   Procedure: TRANSURETHRAL RESECTION OF THE PROSTATE (TURP);  Surgeon: Ceasar Mons, MD;  Location: [facility];  Service: Urology;  Laterality: N/A;    Family History  Problem Relation Age of Onset   Liver cancer Neg Hx    Liver disease Neg Hx  Social History   Tobacco Use  Smoking Status Former  Smokeless Tobacco Never    Social History   Substance and Sexual Activity  Alcohol Use No     Allergies  Allergen Reactions   Bee Venom Anaphylaxis    Face gets red and hard to breathe     Current Outpatient Medications  Medication Sig Dispense Refill   albuterol (PROVENTIL) (2.5 MG/3ML) 0.083% nebulizer solution Take 3 mLs (2.5 mg total) by nebulization every 6 (six) hours as needed for wheezing or shortness of breath. 75 mL 12   albuterol (VENTOLIN HFA) 108 (90 Base) MCG/ACT inhaler Inhale 1-2 puffs into the lungs every 6 (six) hours as needed for wheezing or shortness of breath. 1 each 0   allopurinol (ZYLOPRIM) 100 MG tablet Take 100 mg by mouth 2 (two) times daily.     aspirin EC 81 MG tablet Take 81 mg by mouth daily. Swallow whole.     atorvastatin (LIPITOR) 10 MG tablet Take 10 mg by mouth at bedtime.     budesonide-formoterol (SYMBICORT) 80-4.5 MCG/ACT inhaler Inhale 2 puffs into the lungs 2 (two) times daily.     chlorthalidone (HYGROTON) 50 MG tablet Take 50 mg by mouth daily.     FLUoxetine (PROZAC) 20 MG capsule Take 20 mg by mouth daily.     HYDROcodone-acetaminophen (NORCO) 5-325 MG tablet Take 1 tablet by mouth every 4  (four) hours as needed for moderate pain. 15 tablet 0   mometasone-formoterol (DULERA) 200-5 MCG/ACT AERO Inhale 2 puffs into the lungs at bedtime.     omeprazole (PRILOSEC) 20 MG capsule Take 20 mg by mouth daily.     No current facility-administered medications for this visit.    Review of Systems  Respiratory:  Positive for shortness of breath.   Cardiovascular:  Negative for chest pain.  Neurological: Negative.     PHYSICAL EXAMINATION: BP 139/88 (BP Location: Right Arm, Patient Position: Sitting)   Pulse (!) 105   Resp 18   Ht 5\' 10"  (1.778 m)   Wt 190 lb (86.2 kg)   SpO2 97% Comment: RA  BMI 27.26 kg/m  Physical Exam Constitutional:      General: He is not in acute distress.    Appearance: He is not ill-appearing.  Cardiovascular:     Rate and Rhythm: Tachycardia present.  Pulmonary:     Effort: Pulmonary effort is normal. No respiratory distress.     Breath sounds: Wheezing present.     Comments: Distant breath sounds Abdominal:     General: There is no distension.  Musculoskeletal:     Comments: Walks with a cane very slowly  Neurological:     General: No focal deficit present.     Mental Status: He is alert and oriented to person, place, and time.    Diagnostic Studies & Laboratory data:     Recent Radiology Findings:   NM PET Image Initial (PI) Skull Base To Thigh (F-18 FDG)  Result Date: 07/27/2021 CLINICAL DATA:  Subsequent treatment strategy for LEFT upper lobe pulmonary nodule. EXAM: NUCLEAR MEDICINE PET SKULL BASE TO THIGH TECHNIQUE: 9.5 mCi F-18 FDG was injected intravenously. Full-ring PET imaging was performed from the skull base to thigh after the radiotracer. CT data was obtained and used for attenuation correction and anatomic localization. Fasting blood glucose: 114 mg/dl COMPARISON:  PET-CT scan 01/18/2021 FINDINGS: Mediastinal blood pool activity: SUV max 2.4 Liver activity: SUV max NA NECK: No hypermetabolic lymph nodes in the neck. Incidental CT  findings: none CHEST: The pleural base nodule in the medial LEFT upper lobe measures 1.5 by 1.1 cm (image series 7) which compares to 1. x 1 0.8 cm on comparison PET-CT scan. Lesion is increased in metabolic activity with SUV max equal 4.9 (image 49) compared SUV max equal 1.9 No additional hypermetabolic or enlarged pulmonary nodules. No hypermetabolic mediastinal lymph nodes. Incidental CT findings: Coronary artery calcification and aortic atherosclerotic calcification. ABDOMEN/PELVIS: No abnormal hypermetabolic activity within the liver, pancreas, adrenal glands, or spleen. No hypermetabolic lymph nodes in the abdomen or pelvis. Incidental CT findings: none SKELETON: No focal hypermetabolic activity to suggest skeletal metastasis. Incidental CT findings: none IMPRESSION: 1. Enlarging hypermetabolic pulmonary nodule in the medial LEFT upper lobe is most concerning for bronchogenic carcinoma. 2. No evidence of metastatic mediastinal lymphadenopathy or distant metastatic disease Electronically Signed   By: Suzy Bouchard M.D.   On: 07/27/2021 08:49   CT Super D Chest Wo Contrast  Result Date: 07/27/2021 CLINICAL DATA:  Indeterminate LEFT upper lobe nodule on FDG PET scan EXAM: CT CHEST WITHOUT CONTRAST TECHNIQUE: Multidetector CT imaging of the chest was performed using thin slice collimation for electromagnetic bronchoscopy planning purposes, without intravenous contrast. COMPARISON:  PET-CT scan same day, PET-CT scan 01/18/2021 FINDINGS: Cardiovascular: Coronary artery calcification and aortic atherosclerotic calcification. Mediastinum/Nodes: No axillary or supraclavicular adenopathy. No mediastinal or hilar adenopathy. No pericardial fluid. Esophagus normal. Lungs/Pleura: LEFT upper lobe pulmonary nodule of concern measures 15 mm x 11 mm (image 29/series 7) compared to 11 mm x 8 mm on PET-CT scan 01/18/2021. This nodule was hypermetabolic on same day PET-CT scan. No additional pulmonary nodules. Mild  centrilobular emphysema the upper lobes. Upper Abdomen: Limited view of the liver, kidneys, pancreas are unremarkable. Normal adrenal glands. Hyperdense cysts extends to the upper pole of the RIGHT kidney measuring 20 mm. Lesion has homogeneous high signal intensity on noncontrast imaging and no metabolic activity on comparison same-day PET-CT scan and favored benign proteinaceous cysts. Second smaller RIGHT upper pole high-density cyst on image 155/2 Musculoskeletal: No chest wall mass or suspicious bone lesions identified. IMPRESSION: 1. Enlarging nodule of concern in the medial LEFT upper lobe is hypermetabolic on same day PET-CT scan and is highly concerning for bronchogenic carcinoma. 2. No evidence metastatic lymphadenopathy. 3. High-density cysts of the RIGHT kidney are favored benign proteinaceous cyst (Bosniak 2). Electronically Signed   By: Suzy Bouchard M.D.   On: 07/27/2021 09:14       I have independently reviewed the above radiology studies  and reviewed the findings with the patient.   Recent Lab Findings: Lab Results  Component Value Date   WBC 7.4 07/03/2021   HGB 12.9 (L) 07/03/2021   HCT 37.9 (L) 07/03/2021   PLT 247 07/03/2021   GLUCOSE 110 (H) 07/03/2021   ALT 10 12/18/2019   AST 18 01/16/2015   NA 142 07/03/2021   K 4.0 07/03/2021   CL 109 07/03/2021   CREATININE 1.07 07/03/2021   BUN 13 07/03/2021   CO2 26 07/03/2021     PFTs: - FVC: 56% - FEV1: 31% -DLCO: 58%    Assessment / Plan:   70 year old male with a 1.1 cm apical left upper lobe pulmonary nodule.  There is increased uptake with an SUV max of 4.9.  Given his smoking history this is highly concerning for primary lung cancer.  Based off of his pulmonary function testing and his significant respiratory symptoms, I am concerned that surgical resection would put him at very high  risk of requiring supplemental oxygen lifelong.  I will review this case with Dr.Icard however I do think that we can proceed  with navigational bronchoscopy for the purposes of diagnosis.  If this is positive for cancer then SBRT likely would be the best course given his poor lung function and respiratory status.     I  spent 40 minutes with  the patient face to face in counseling and coordination of care.    Lajuana Matte 08/05/2021 6:22 PM

## 2021-08-02 NOTE — Telephone Encounter (Signed)
See phone note from 07/27/21. Will close encounter.

## 2021-08-05 ENCOUNTER — Encounter: Payer: Self-pay | Admitting: Thoracic Surgery (Cardiothoracic Vascular Surgery)

## 2021-08-05 ENCOUNTER — Institutional Professional Consult (permissible substitution) (INDEPENDENT_AMBULATORY_CARE_PROVIDER_SITE_OTHER): Payer: No Typology Code available for payment source | Admitting: Thoracic Surgery (Cardiothoracic Vascular Surgery)

## 2021-08-05 ENCOUNTER — Other Ambulatory Visit: Payer: Self-pay

## 2021-08-05 VITALS — BP 139/88 | HR 105 | Resp 18 | Ht 70.0 in | Wt 190.0 lb

## 2021-08-05 DIAGNOSIS — R911 Solitary pulmonary nodule: Secondary | ICD-10-CM | POA: Diagnosis not present

## 2021-08-15 ENCOUNTER — Other Ambulatory Visit: Payer: Self-pay

## 2021-08-15 ENCOUNTER — Encounter: Payer: Self-pay | Admitting: Pulmonary Disease

## 2021-08-15 ENCOUNTER — Ambulatory Visit (INDEPENDENT_AMBULATORY_CARE_PROVIDER_SITE_OTHER): Payer: No Typology Code available for payment source | Admitting: Pulmonary Disease

## 2021-08-15 VITALS — BP 124/82 | HR 82 | Temp 98.7°F | Ht 69.0 in | Wt 185.6 lb

## 2021-08-15 DIAGNOSIS — R911 Solitary pulmonary nodule: Secondary | ICD-10-CM

## 2021-08-15 DIAGNOSIS — J441 Chronic obstructive pulmonary disease with (acute) exacerbation: Secondary | ICD-10-CM

## 2021-08-15 DIAGNOSIS — Z87891 Personal history of nicotine dependence: Secondary | ICD-10-CM | POA: Diagnosis not present

## 2021-08-15 DIAGNOSIS — J432 Centrilobular emphysema: Secondary | ICD-10-CM

## 2021-08-15 MED ORDER — PREDNISONE 10 MG PO TABS: ORAL_TABLET | ORAL | 0 refills | Status: DC

## 2021-08-15 NOTE — Patient Instructions (Signed)
Thank you for visiting Dr. Valeta Harms at [facility]. Today we recommend the following:  Orders Placed This Encounter  Procedures   Procedural/ Surgical Case Request: [place]   Ambulatory referral to Pulmonology   Bronchoscopy tentative 09/07/2021  Return in about 4 weeks (around 09/12/2021) for with APP or Dr. Valeta Harms.    Please do your part to reduce the spread of COVID-19.

## 2021-08-15 NOTE — Progress Notes (Signed)
Synopsis: Referred in September 2022 for lung nodule by Wenda Low, MD  Subjective:   PATIENT ID: Joaquin Bend GENDER: male DOB: 04-24-1951, MRN: [id number]  Chief Complaint  Patient presents with   Follow-up    Pt. Wants to talk about getting a bronch done.    This is a 70 year old gentleman, history of COPD, chronic kidney disease, hypertension, hyperlipidemia, TIA, stroke.  Presents for evaluation of lung nodule. Patient had a nuclear medicine PET scan in March 2022 which revealed a 11 x 7 x 8 mm left upper lobe pleural-based nodule that had an SUV of 2.  Recommended at the time close CT surveillance follow-up.  OV 08/15/2021: Here today for follow-up after surgical consultation.  Dr. Kipp Brood felt that patient would be better served by having robotic assisted bronchoscopy fiducial placement and radiation therapy versus undergoing surgical lobectomy.  After discussing the risk-benefit alternative with the patient and patient's wife.  They are here today to discuss bronchoscopy and neck steps.  We discussed this today in detail risk benefits and alternatives and they were agreeable to proceed.  Unfortunately over the past few weeks patient has had increased use of his albuterol as well as shortness of breath and wheezing.  On exam today is also wheezing.    Past Medical History:  Diagnosis Date   Anxiety    Asthma    Atherosclerosis 01/16/2015   Noted on CXR   Bilateral lower extremity edema 01/16/2015   Chronic kidney disease (CKD), stage II (mild)    COPD (chronic obstructive pulmonary disease) (HCC)    Depression    GERD (gastroesophageal reflux disease)    Gout    History of colon polyps    Hoarseness    Hyperlipidemia    Hypertension    Neck abscess 09/03/2016   Pulmonary emphysema ([place])    Seizure ([place])    Stroke [facility]) 2014   Thyroid disease    TIA (transient ischemic attack)    Urinary frequency    Urine retention    UTI (urinary tract infection)       Family History  Problem Relation Age of Onset   Liver cancer Neg Hx    Liver disease Neg Hx      Past Surgical History:  Procedure Laterality Date   COLONOSCOPY  08/29/2018   CYSTOSCOPY WITH BIOPSY N/A 11/20/2018   Procedure: CYSTOSCOPY WITH BLADDER BIOPSY;  Surgeon: Ceasar Mons, MD;  Location: [facility];  Service: Urology;  Laterality: N/A;   HERNIA REPAIR     t monitor     TRANSURETHRAL RESECTION OF PROSTATE N/A 11/20/2018   Procedure: TRANSURETHRAL RESECTION OF THE PROSTATE (TURP);  Surgeon: Ceasar Mons, MD;  Location: [facility];  Service: Urology;  Laterality: N/A;    Social History   Socioeconomic History   Marital status: Married    Spouse name: Not on file   Number of children: Not on file   Years of education: Not on file   Highest education level: Not on file  Occupational History   Not on file  Tobacco Use   Smoking status: Former   Smokeless tobacco: Never  Vaping Use   Vaping Use: Never used  Substance and Sexual Activity   Alcohol use: No   Drug use: No   Sexual activity: Not on file  Other Topics Concern   Not on file  Social History Narrative   Not on file   Social Determinants of Health  Financial Resource Strain: Not on file  Food Insecurity: Not on file  Transportation Needs: Not on file  Physical Activity: Not on file  Stress: Not on file  Social Connections: Not on file  Intimate Partner Violence: Not on file     Allergies  Allergen Reactions   Bee Venom Anaphylaxis    Face gets red and hard to breathe      Outpatient Medications Prior to Visit  Medication Sig Dispense Refill   albuterol (PROVENTIL) (2.5 MG/3ML) 0.083% nebulizer solution Take 3 mLs (2.5 mg total) by nebulization every 6 (six) hours as needed for wheezing or shortness of breath. 75 mL 12   albuterol (VENTOLIN HFA) 108 (90 Base) MCG/ACT inhaler Inhale 1-2 puffs into the lungs every 6 (six) hours as needed  for wheezing or shortness of breath. 1 each 0   allopurinol (ZYLOPRIM) 100 MG tablet Take 100 mg by mouth 2 (two) times daily.     aspirin EC 81 MG tablet Take 81 mg by mouth daily. Swallow whole.     atorvastatin (LIPITOR) 10 MG tablet Take 10 mg by mouth at bedtime.     budesonide-formoterol (SYMBICORT) 80-4.5 MCG/ACT inhaler Inhale 2 puffs into the lungs 2 (two) times daily.     chlorthalidone (HYGROTON) 50 MG tablet Take 50 mg by mouth daily.     FLUoxetine (PROZAC) 20 MG capsule Take 20 mg by mouth daily.     HYDROcodone-acetaminophen (NORCO) 5-325 MG tablet Take 1 tablet by mouth every 4 (four) hours as needed for moderate pain. 15 tablet 0   mometasone-formoterol (DULERA) 200-5 MCG/ACT AERO Inhale 2 puffs into the lungs at bedtime.     omeprazole (PRILOSEC) 20 MG capsule Take 20 mg by mouth daily.     No facility-administered medications prior to visit.    Review of Systems  Constitutional:  Negative for chills, fever, malaise/fatigue and weight loss.  HENT:  Negative for hearing loss, sore throat and tinnitus.   Eyes:  Negative for blurred vision and double vision.  Respiratory:  Positive for cough, shortness of breath and wheezing. Negative for hemoptysis, sputum production and stridor.   Cardiovascular:  Negative for chest pain, palpitations, orthopnea, leg swelling and PND.  Gastrointestinal:  Negative for abdominal pain, constipation, diarrhea, heartburn, nausea and vomiting.  Genitourinary:  Negative for dysuria, hematuria and urgency.  Musculoskeletal:  Negative for joint pain and myalgias.  Skin:  Negative for itching and rash.  Neurological:  Negative for dizziness, tingling, weakness and headaches.  Endo/Heme/Allergies:  Negative for environmental allergies. Does not bruise/bleed easily.  Psychiatric/Behavioral:  Negative for depression. The patient is not nervous/anxious and does not have insomnia.   All other systems reviewed and are negative.   Objective:  Physical  Exam Vitals reviewed.  Constitutional:      General: He is not in acute distress.    Appearance: He is well-developed.  HENT:     Head: Normocephalic and atraumatic.  Eyes:     General: No scleral icterus.    Conjunctiva/sclera: Conjunctivae normal.     Pupils: Pupils are equal, round, and reactive to light.  Neck:     Vascular: No JVD.     Trachea: No tracheal deviation.  Cardiovascular:     Rate and Rhythm: Normal rate and regular rhythm.     Heart sounds: Normal heart sounds. No murmur heard. Pulmonary:     Effort: Pulmonary effort is normal. No tachypnea, accessory muscle usage or respiratory distress.     Breath sounds:  No stridor. Wheezing present. No rhonchi or rales.  Abdominal:     General: Bowel sounds are normal. There is no distension.     Palpations: Abdomen is soft.     Tenderness: There is no abdominal tenderness.  Musculoskeletal:        General: No tenderness.     Cervical back: Neck supple.  Lymphadenopathy:     Cervical: No cervical adenopathy.  Skin:    General: Skin is warm and dry.     Capillary Refill: Capillary refill takes less than 2 seconds.     Findings: No rash.  Neurological:     Mental Status: He is alert and oriented to person, place, and time.  Psychiatric:        Behavior: Behavior normal.     Vitals:   08/15/21 1603  BP: 124/82  Pulse: 82  Temp: 98.7 F (37.1 C)  TempSrc: Oral  SpO2: 98%  Weight: 185 lb 9.6 oz (84.2 kg)  Height: 5\' 9"  (1.753 m)   98% on RA BMI Readings from Last 3 Encounters:  08/15/21 27.41 kg/m  08/05/21 27.26 kg/m  07/08/21 27.41 kg/m   Wt Readings from Last 3 Encounters:  08/15/21 185 lb 9.6 oz (84.2 kg)  08/05/21 190 lb (86.2 kg)  07/08/21 191 lb (86.6 kg)     CBC    Component Value Date/Time   WBC 7.4 07/03/2021 0849   RBC 4.03 (L) 07/03/2021 0849   HGB 12.9 (L) 07/03/2021 0849   HCT 37.9 (L) 07/03/2021 0849   PLT 247 07/03/2021 0849   MCV 94.0 07/03/2021 0849   MCH 32.0 07/03/2021  0849   MCHC 34.0 07/03/2021 0849   RDW 12.4 07/03/2021 0849   LYMPHSABS 4.0 07/03/2021 0849   MONOABS 0.5 07/03/2021 0849   EOSABS 0.3 07/03/2021 0849   BASOSABS 0.0 07/03/2021 0849    Chest Imaging: June 2022 CT imaging: CT imaging was completed at [facility].  Patient brought disc today to the office. We reviewed imaging today in the office which shows a lobulated left upper lobe medial pleural-based lesion concerning for primary bronchogenic carcinoma. The patient's images have been independently reviewed by me.    Pulmonary Functions Testing Results: PFT Results Latest Ref Rng & Units 07/13/2021  FVC-Pre L 2.09  FVC-Predicted Pre % 56  FVC-Post L 2.62  FVC-Predicted Post % 70  Pre FEV1/FVC % % 43  Post FEV1/FCV % % 50  FEV1-Pre L 0.89  FEV1-Predicted Pre % 31  FEV1-Post L 1.31  DLCO uncorrected ml/min/mmHg 13.99  DLCO UNC% % 55  DLCO corrected ml/min/mmHg 14.75  DLCO COR %Predicted % 58  DLVA Predicted % 79  TLC L 7.44  TLC % Predicted % 109  RV % Predicted % 224    FeNO:   Pathology:   Echocardiogram:   Heart Catheterization:     Assessment & Plan:     ICD-10-CM   1. Lung nodule  R91.1 Procedural/ Surgical Case Request: [place]    Ambulatory referral to Pulmonology    2. COPD with acute exacerbation ([place])  J44.1     3. Centrilobular emphysema ([place])  J43.2     4. Former smoker  Z87.891       Discussion:  This is a 70 year old gentleman, former smoker quit in 2015.  He has COPD at baseline.  Unfortunately has had worsening shortness of breath and wheezing chest tightness.  I suspect he is undergoing an acute exacerbation of his COPD.  We also discussed his CT imaging and nodule findings concerning for malignancy  We also discussed the findings from his recent surgical consultation with recommendations for bronchoscopy, fiducial placement and referral to radiation oncology  Plan: Today in the office we  discussed the risk benefits and alternatives of proceeding with bronchoscopy. Patient is agreeable to proceed. We discussed the risk of bleeding and pneumothorax. Orders have been placed to get patient's bronchoscopy tentatively scheduled for 09/07/2021 We will need to call endoscopy directly to help schedule this. We will plan for biopsy and fiducial placement and referral to radiation oncology pending pathology results.  As for his COPD exacerbation we will start him on a prednisone taper to help optimize him before his procedure in a few weeks.    Current Outpatient Medications:    albuterol (PROVENTIL) (2.5 MG/3ML) 0.083% nebulizer solution, Take 3 mLs (2.5 mg total) by nebulization every 6 (six) hours as needed for wheezing or shortness of breath., Disp: 75 mL, Rfl: 12   albuterol (VENTOLIN HFA) 108 (90 Base) MCG/ACT inhaler, Inhale 1-2 puffs into the lungs every 6 (six) hours as needed for wheezing or shortness of breath., Disp: 1 each, Rfl: 0   allopurinol (ZYLOPRIM) 100 MG tablet, Take 100 mg by mouth 2 (two) times daily., Disp: , Rfl:    aspirin EC 81 MG tablet, Take 81 mg by mouth daily. Swallow whole., Disp: , Rfl:    atorvastatin (LIPITOR) 10 MG tablet, Take 10 mg by mouth at bedtime., Disp: , Rfl:    budesonide-formoterol (SYMBICORT) 80-4.5 MCG/ACT inhaler, Inhale 2 puffs into the lungs 2 (two) times daily., Disp: , Rfl:    chlorthalidone (HYGROTON) 50 MG tablet, Take 50 mg by mouth daily., Disp: , Rfl:    FLUoxetine (PROZAC) 20 MG capsule, Take 20 mg by mouth daily., Disp: , Rfl:    HYDROcodone-acetaminophen (NORCO) 5-325 MG tablet, Take 1 tablet by mouth every 4 (four) hours as needed for moderate pain., Disp: 15 tablet, Rfl: 0   mometasone-formoterol (DULERA) 200-5 MCG/ACT AERO, Inhale 2 puffs into the lungs at bedtime., Disp: , Rfl:    omeprazole (PRILOSEC) 20 MG capsule, Take 20 mg by mouth daily., Disp: , Rfl:    predniSONE (DELTASONE) 10 MG tablet, Take 4 tabs by mouth once  daily x4 days, then 3 tabs x4 days, 2 tabs x4 days, 1 tab x4 days and stop., Disp: 40 tablet, Rfl: 0    Garner Nash, DO [facility] 08/15/2021 5:54 PM

## 2021-08-15 NOTE — H&P (View-Only) (Signed)
Synopsis: Referred in September 2022 for lung nodule by Wenda Low, MD  Subjective:   PATIENT ID: Bruce Sullivan GENDER: male DOB: [birth year], MRN: [id number]  Chief Complaint  Patient presents with   Follow-up    Pt. Wants to talk about getting a bronch done.    This is a 70 year old gentleman, history of COPD, chronic kidney disease, hypertension, hyperlipidemia, TIA, stroke.  Presents for evaluation of lung nodule. Patient had a nuclear medicine PET scan in March 2022 which revealed a 11 x 7 x 8 mm left upper lobe pleural-based nodule that had an SUV of 2.  Recommended at the time close CT surveillance follow-up.  OV 08/15/2021: Here today for follow-up after surgical consultation.  Dr. Kipp Brood felt that patient would be better served by having robotic assisted bronchoscopy fiducial placement and radiation therapy versus undergoing surgical lobectomy.  After discussing the risk-benefit alternative with the patient and patient's wife.  They are here today to discuss bronchoscopy and neck steps.  We discussed this today in detail risk benefits and alternatives and they were agreeable to proceed.  Unfortunately over the past few weeks patient has had increased use of his albuterol as well as shortness of breath and wheezing.  On exam today is also wheezing.    Past Medical History:  Diagnosis Date   Anxiety    Asthma    Atherosclerosis 01/16/2015   Noted on CXR   Bilateral lower extremity edema 01/16/2015   Chronic kidney disease (CKD), stage II (mild)    COPD (chronic obstructive pulmonary disease) (HCC)    Depression    GERD (gastroesophageal reflux disease)    Gout    History of colon polyps    Hoarseness    Hyperlipidemia    Hypertension    Neck abscess 09/03/2016   Pulmonary emphysema ([place])    Seizure ([place])    Stroke [facility]) 2014   Thyroid disease    TIA (transient ischemic attack)    Urinary frequency    Urine retention    UTI (urinary tract infection)       Family History  Problem Relation Age of Onset   Liver cancer Neg Hx    Liver disease Neg Hx      Past Surgical History:  Procedure Laterality Date   COLONOSCOPY  08/29/2018   CYSTOSCOPY WITH BIOPSY N/A 11/20/2018   Procedure: CYSTOSCOPY WITH BLADDER BIOPSY;  Surgeon: Ceasar Mons, MD;  Location[facility];  Service: Urology;  Laterality: N/A;   HERNIA REPAIR     t monitor     TRANSURETHRAL RESECTION OF PROSTATE N/A 11/20/2018   Procedure: TRANSURETHRAL RESECTION OF THE PROSTATE (TURP);  Surgeon: Ceasar Mons, MD;  Location: [facility];  Service: Urology;  Laterality: N/A;    Social History   Socioeconomic History   Marital status: Married    Spouse name: Not on file   Number of children: Not on file   Years of education: Not on file   Highest education level: Not on file  Occupational History   Not on file  Tobacco Use   Smoking status: Former   Smokeless tobacco: Never  Vaping Use   Vaping Use: Never used  Substance and Sexual Activity   Alcohol use: No   Drug use: No   Sexual activity: Not on file  Other Topics Concern   Not on file  Social History Narrative   Not on file   Social Determinants of Health  Financial Resource Strain: Not on file  Food Insecurity: Not on file  Transportation Needs: Not on file  Physical Activity: Not on file  Stress: Not on file  Social Connections: Not on file  Intimate Partner Violence: Not on file     Allergies  Allergen Reactions   Bee Venom Anaphylaxis    Face gets red and hard to breathe      Outpatient Medications Prior to Visit  Medication Sig Dispense Refill   albuterol (PROVENTIL) (2.5 MG/3ML) 0.083% nebulizer solution Take 3 mLs (2.5 mg total) by nebulization every 6 (six) hours as needed for wheezing or shortness of breath. 75 mL 12   albuterol (VENTOLIN HFA) 108 (90 Base) MCG/ACT inhaler Inhale 1-2 puffs into the lungs every 6 (six) hours as needed  for wheezing or shortness of breath. 1 each 0   allopurinol (ZYLOPRIM) 100 MG tablet Take 100 mg by mouth 2 (two) times daily.     aspirin EC 81 MG tablet Take 81 mg by mouth daily. Swallow whole.     atorvastatin (LIPITOR) 10 MG tablet Take 10 mg by mouth at bedtime.     budesonide-formoterol (SYMBICORT) 80-4.5 MCG/ACT inhaler Inhale 2 puffs into the lungs 2 (two) times daily.     chlorthalidone (HYGROTON) 50 MG tablet Take 50 mg by mouth daily.     FLUoxetine (PROZAC) 20 MG capsule Take 20 mg by mouth daily.     HYDROcodone-acetaminophen (NORCO) 5-325 MG tablet Take 1 tablet by mouth every 4 (four) hours as needed for moderate pain. 15 tablet 0   mometasone-formoterol (DULERA) 200-5 MCG/ACT AERO Inhale 2 puffs into the lungs at bedtime.     omeprazole (PRILOSEC) 20 MG capsule Take 20 mg by mouth daily.     No facility-administered medications prior to visit.    Review of Systems  Constitutional:  Negative for chills, fever, malaise/fatigue and weight loss.  HENT:  Negative for hearing loss, sore throat and tinnitus.   Eyes:  Negative for blurred vision and double vision.  Respiratory:  Positive for cough, shortness of breath and wheezing. Negative for hemoptysis, sputum production and stridor.   Cardiovascular:  Negative for chest pain, palpitations, orthopnea, leg swelling and PND.  Gastrointestinal:  Negative for abdominal pain, constipation, diarrhea, heartburn, nausea and vomiting.  Genitourinary:  Negative for dysuria, hematuria and urgency.  Musculoskeletal:  Negative for joint pain and myalgias.  Skin:  Negative for itching and rash.  Neurological:  Negative for dizziness, tingling, weakness and headaches.  Endo/Heme/Allergies:  Negative for environmental allergies. Does not bruise/bleed easily.  Psychiatric/Behavioral:  Negative for depression. The patient is not nervous/anxious and does not have insomnia.   All other systems reviewed and are negative.   Objective:  Physical  Exam Vitals reviewed.  Constitutional:      General: He is not in acute distress.    Appearance: He is well-developed.  HENT:     Head: Normocephalic and atraumatic.  Eyes:     General: No scleral icterus.    Conjunctiva/sclera: Conjunctivae normal.     Pupils: Pupils are equal, round, and reactive to light.  Neck:     Vascular: No JVD.     Trachea: No tracheal deviation.  Cardiovascular:     Rate and Rhythm: Normal rate and regular rhythm.     Heart sounds: Normal heart sounds. No murmur heard. Pulmonary:     Effort: Pulmonary effort is normal. No tachypnea, accessory muscle usage or respiratory distress.     Breath sounds:  No stridor. Wheezing present. No rhonchi or rales.  Abdominal:     General: Bowel sounds are normal. There is no distension.     Palpations: Abdomen is soft.     Tenderness: There is no abdominal tenderness.  Musculoskeletal:        General: No tenderness.     Cervical back: Neck supple.  Lymphadenopathy:     Cervical: No cervical adenopathy.  Skin:    General: Skin is warm and dry.     Capillary Refill: Capillary refill takes less than 2 seconds.     Findings: No rash.  Neurological:     Mental Status: He is alert and oriented to person, place, and time.  Psychiatric:        Behavior: Behavior normal.     Vitals:   08/15/21 1603  BP: 124/82  Pulse: 82  Temp: 98.7 F (37.1 C)  TempSrc: Oral  SpO2: 98%  Weight: 185 lb 9.6 oz (84.2 kg)  Height: 5\' 9"  (1.753 m)   98% on RA BMI Readings from Last 3 Encounters:  08/15/21 27.41 kg/m  08/05/21 27.26 kg/m  07/08/21 27.41 kg/m   Wt Readings from Last 3 Encounters:  08/15/21 185 lb 9.6 oz (84.2 kg)  08/05/21 190 lb (86.2 kg)  07/08/21 191 lb (86.6 kg)     CBC    Component Value Date/Time   WBC 7.4 07/03/2021 0849   RBC 4.03 (L) 07/03/2021 0849   HGB 12.9 (L) 07/03/2021 0849   HCT 37.9 (L) 07/03/2021 0849   PLT 247 07/03/2021 0849   MCV 94.0 07/03/2021 0849   MCH 32.0 07/03/2021  0849   MCHC 34.0 07/03/2021 0849   RDW 12.4 07/03/2021 0849   LYMPHSABS 4.0 07/03/2021 0849   MONOABS 0.5 07/03/2021 0849   EOSABS 0.3 07/03/2021 0849   BASOSABS 0.0 07/03/2021 0849    Chest Imaging: June 2022 CT imaging: CT imaging was completed at [facility].  Patient brought disc today to the office. We reviewed imaging today in the office which shows a lobulated left upper lobe medial pleural-based lesion concerning for primary bronchogenic carcinoma. The patient's images have been independently reviewed by me.    Pulmonary Functions Testing Results: PFT Results Latest Ref Rng & Units 07/13/2021  FVC-Pre L 2.09  FVC-Predicted Pre % 56  FVC-Post L 2.62  FVC-Predicted Post % 70  Pre FEV1/FVC % % 43  Post FEV1/FCV % % 50  FEV1-Pre L 0.89  FEV1-Predicted Pre % 31  FEV1-Post L 1.31  DLCO uncorrected ml/min/mmHg 13.99  DLCO UNC% % 55  DLCO corrected ml/min/mmHg 14.75  DLCO COR %Predicted % 58  DLVA Predicted % 79  TLC L 7.44  TLC % Predicted % 109  RV % Predicted % 224    FeNO:   Pathology:   Echocardiogram:   Heart Catheterization:     Assessment & Plan:     ICD-10-CM   1. Lung nodule  R91.1 Procedural/ Surgical Case Request: [place]    Ambulatory referral to Pulmonology    2. COPD with acute exacerbation ([place])  J44.1     3. Centrilobular emphysema ([place])  J43.2     4. Former smoker  Z87.891       Discussion:  This is a 70 year old gentleman, former smoker quit in 2015.  He has COPD at baseline.  Unfortunately has had worsening shortness of breath and wheezing chest tightness.  I suspect he is undergoing an acute exacerbation of his COPD.  We also discussed his CT imaging and nodule findings concerning for malignancy  We also discussed the findings from his recent surgical consultation with recommendations for bronchoscopy, fiducial placement and referral to radiation oncology  Plan: Today in the office we  discussed the risk benefits and alternatives of proceeding with bronchoscopy. Patient is agreeable to proceed. We discussed the risk of bleeding and pneumothorax. Orders have been placed to get patient's bronchoscopy tentatively scheduled for 09/07/2021 We will need to call endoscopy directly to help schedule this. We will plan for biopsy and fiducial placement and referral to radiation oncology pending pathology results.  As for his COPD exacerbation we will start him on a prednisone taper to help optimize him before his procedure in a few weeks.    Current Outpatient Medications:    albuterol (PROVENTIL) (2.5 MG/3ML) 0.083% nebulizer solution, Take 3 mLs (2.5 mg total) by nebulization every 6 (six) hours as needed for wheezing or shortness of breath., Disp: 75 mL, Rfl: 12   albuterol (VENTOLIN HFA) 108 (90 Base) MCG/ACT inhaler, Inhale 1-2 puffs into the lungs every 6 (six) hours as needed for wheezing or shortness of breath., Disp: 1 each, Rfl: 0   allopurinol (ZYLOPRIM) 100 MG tablet, Take 100 mg by mouth 2 (two) times daily., Disp: , Rfl:    aspirin EC 81 MG tablet, Take 81 mg by mouth daily. Swallow whole., Disp: , Rfl:    atorvastatin (LIPITOR) 10 MG tablet, Take 10 mg by mouth at bedtime., Disp: , Rfl:    budesonide-formoterol (SYMBICORT) 80-4.5 MCG/ACT inhaler, Inhale 2 puffs into the lungs 2 (two) times daily., Disp: , Rfl:    chlorthalidone (HYGROTON) 50 MG tablet, Take 50 mg by mouth daily., Disp: , Rfl:    FLUoxetine (PROZAC) 20 MG capsule, Take 20 mg by mouth daily., Disp: , Rfl:    HYDROcodone-acetaminophen (NORCO) 5-325 MG tablet, Take 1 tablet by mouth every 4 (four) hours as needed for moderate pain., Disp: 15 tablet, Rfl: 0   mometasone-formoterol (DULERA) 200-5 MCG/ACT AERO, Inhale 2 puffs into the lungs at bedtime., Disp: , Rfl:    omeprazole (PRILOSEC) 20 MG capsule, Take 20 mg by mouth daily., Disp: , Rfl:    predniSONE (DELTASONE) 10 MG tablet, Take 4 tabs by mouth once  daily x4 days, then 3 tabs x4 days, 2 tabs x4 days, 1 tab x4 days and stop., Disp: 40 tablet, Rfl: 0    Garner Nash, DO [facility] 08/15/2021 5:54 PM

## 2021-08-16 DIAGNOSIS — R911 Solitary pulmonary nodule: Secondary | ICD-10-CM | POA: Insufficient documentation

## 2021-08-24 ENCOUNTER — Encounter: Payer: Self-pay | Admitting: Pulmonary Disease

## 2021-09-05 ENCOUNTER — Encounter (HOSPITAL_COMMUNITY): Payer: Self-pay | Admitting: Pulmonary Disease

## 2021-09-05 ENCOUNTER — Other Ambulatory Visit: Payer: Self-pay | Admitting: Pulmonary Disease

## 2021-09-06 ENCOUNTER — Encounter (HOSPITAL_COMMUNITY): Payer: Self-pay | Admitting: Pulmonary Disease

## 2021-09-06 ENCOUNTER — Other Ambulatory Visit: Payer: Self-pay

## 2021-09-06 LAB — SARS CORONAVIRUS 2 (TAT 6-24 HRS): SARS Coronavirus 2: NEGATIVE

## 2021-09-06 NOTE — Anesthesia Preprocedure Evaluation (Signed)
Anesthesia Evaluation    Reviewed: Allergy & Precautions, H&P , Patient's Chart, lab work & pertinent test results, reviewed documented beta blocker date and time   Airway Mallampati: I  TM Distance: >3 FB Neck ROM: full    Dental no notable dental hx. (+) Edentulous Upper, Edentulous Lower, Upper Dentures, Lower Dentures, Poor Dentition   Pulmonary shortness of breath and with exertion, asthma , COPD, former smoker,    Pulmonary exam normal breath sounds clear to auscultation       Cardiovascular Exercise Tolerance: Good hypertension, Pt. on medications negative cardio ROS   Rhythm:regular Rate:Normal     Neuro/Psych Seizures -, Well Controlled,  PSYCHIATRIC DISORDERS Anxiety Depression TIA   GI/Hepatic Neg liver ROS, GERD  Medicated,  Endo/Other  Hypothyroidism   Renal/GU CRFRenal disease  negative genitourinary   Musculoskeletal  (+) Arthritis , Osteoarthritis,    Abdominal   Peds  Hematology negative hematology ROS (+)   Anesthesia Other Findings   Reproductive/Obstetrics negative OB ROS                             Anesthesia Physical  Anesthesia Plan  ASA: 3  Anesthesia Plan: General   Post-op Pain Management:    Induction: Intravenous  PONV Risk Score and Plan: 2 and Treatment may vary due to age or medical condition and Ondansetron  Airway Management Planned: Oral ETT  Additional Equipment: None  Intra-op Plan:   Post-operative Plan: Extubation in OR  Informed Consent: I have reviewed the patients History and Physical, chart, labs and discussed the procedure including the risks, benefits and alternatives for the proposed anesthesia with the patient or authorized representative who has indicated his/her understanding and acceptance.     Dental Advisory Given  Plan Discussed with: CRNA, Anesthesiologist and Surgeon  Anesthesia Plan Comments: ( )         Anesthesia Quick Evaluation

## 2021-09-06 NOTE — Progress Notes (Signed)
I spoke to Maude Leriche , [place] wife.  [name] reports that [name] has been very short of breath, "I gave him 6 nebulizer treatments last night and two this am,"  [name] reported. [name] states that patient started having shortness of breath yesterday, 09/05/21, " he is out of all of his inhalers, Albuterol, Dulera, Symbicort and the [place] said he will not get more until 10/07/21," Mrs reported. Mrs. Ernster reports that Mr. Mortensen has never been this short of breath when he saw Dr.Icard.  Mr. Nevares does not complain of chest pain. Mrs. Helbert denies having any s/s of Covid in her household.  Patient denies any known exposure to Covid.  I instructed patient to shower with antibiotic soap, if it is available.  Dry off with a clean towel. Do not put lotion, powder, cologne or deodorant or makeup.No jewelry or piercings. Men may shave their face and neck. Woman should not shave. No nail polish, artificial or acrylic nails. Wear clean clothes, brush your teeth. Glasses, contact lens,dentures or partials may not be worn in the OR. If you need to wear them, please bring a case for glasses, do not wear contacts or bring a case, the hospital does not have contact cases, dentures or partials will have to be removed , make sure they are clean, we will provide a denture cup to put them in. You will need some one to drive you home and a responsible person over the age of 42 to stay with you for the first 24 hours after surgery.   I sent a message to Dr. Valeta Harms about shortness of breath, # of number of nebulizer treatment patient has received and that patient is out of all inhalers.               I sent Dr. Valeta Harms a instant message.

## 2021-09-07 ENCOUNTER — Ambulatory Visit (HOSPITAL_COMMUNITY): Payer: No Typology Code available for payment source

## 2021-09-07 ENCOUNTER — Encounter (HOSPITAL_COMMUNITY): Payer: Self-pay | Admitting: Pulmonary Disease

## 2021-09-07 ENCOUNTER — Ambulatory Visit (HOSPITAL_COMMUNITY): Payer: No Typology Code available for payment source | Admitting: Anesthesiology

## 2021-09-07 ENCOUNTER — Ambulatory Visit (HOSPITAL_COMMUNITY)
Admission: RE | Admit: 2021-09-07 | Discharge: 2021-09-07 | Disposition: A | Discharge status: Home / Self Care | Payer: No Typology Code available for payment source | Source: Ambulatory Visit | Attending: Pulmonary Disease | Admitting: Pulmonary Disease

## 2021-09-07 ENCOUNTER — Telehealth: Payer: Self-pay | Admitting: Pulmonary Disease

## 2021-09-07 ENCOUNTER — Encounter (HOSPITAL_COMMUNITY)
Admission: RE | Disposition: A | Discharge status: Home / Self Care | Payer: Self-pay | Source: Ambulatory Visit | Attending: Pulmonary Disease

## 2021-09-07 DIAGNOSIS — Z7951 Long term (current) use of inhaled steroids: Secondary | ICD-10-CM | POA: Insufficient documentation

## 2021-09-07 DIAGNOSIS — I129 Hypertensive chronic kidney disease with stage 1 through stage 4 chronic kidney disease, or unspecified chronic kidney disease: Secondary | ICD-10-CM | POA: Diagnosis not present

## 2021-09-07 DIAGNOSIS — Z7982 Long term (current) use of aspirin: Secondary | ICD-10-CM | POA: Diagnosis not present

## 2021-09-07 DIAGNOSIS — C3490 Malignant neoplasm of unspecified part of unspecified bronchus or lung: Secondary | ICD-10-CM

## 2021-09-07 DIAGNOSIS — Z9889 Other specified postprocedural states: Secondary | ICD-10-CM

## 2021-09-07 DIAGNOSIS — Z87891 Personal history of nicotine dependence: Secondary | ICD-10-CM | POA: Insufficient documentation

## 2021-09-07 DIAGNOSIS — Z79899 Other long term (current) drug therapy: Secondary | ICD-10-CM | POA: Diagnosis not present

## 2021-09-07 DIAGNOSIS — Z87892 Personal history of anaphylaxis: Secondary | ICD-10-CM | POA: Insufficient documentation

## 2021-09-07 DIAGNOSIS — N182 Chronic kidney disease, stage 2 (mild): Secondary | ICD-10-CM | POA: Insufficient documentation

## 2021-09-07 DIAGNOSIS — R911 Solitary pulmonary nodule: Secondary | ICD-10-CM | POA: Insufficient documentation

## 2021-09-07 DIAGNOSIS — Z91030 Bee allergy status: Secondary | ICD-10-CM | POA: Diagnosis not present

## 2021-09-07 DIAGNOSIS — C3412 Malignant neoplasm of upper lobe, left bronchus or lung: Secondary | ICD-10-CM | POA: Diagnosis not present

## 2021-09-07 DIAGNOSIS — J449 Chronic obstructive pulmonary disease, unspecified: Secondary | ICD-10-CM | POA: Diagnosis not present

## 2021-09-07 DIAGNOSIS — Z419 Encounter for procedure for purposes other than remedying health state, unspecified: Secondary | ICD-10-CM

## 2021-09-07 HISTORY — DX: Prediabetes: R73.03

## 2021-09-07 HISTORY — DX: Malignant neoplasm of unspecified part of unspecified bronchus or lung: C34.90

## 2021-09-07 HISTORY — PX: BRONCHIAL NEEDLE ASPIRATION BIOPSY: SHX5106

## 2021-09-07 HISTORY — DX: Dyspnea, unspecified: R06.00

## 2021-09-07 HISTORY — PX: VIDEO BRONCHOSCOPY WITH ENDOBRONCHIAL NAVIGATION: SHX6175

## 2021-09-07 HISTORY — PX: BRONCHIAL BIOPSY: SHX5109

## 2021-09-07 HISTORY — PX: FIDUCIAL MARKER PLACEMENT: SHX6858

## 2021-09-07 HISTORY — DX: Unspecified dementia, unspecified severity, without behavioral disturbance, psychotic disturbance, mood disturbance, and anxiety: F03.90

## 2021-09-07 HISTORY — DX: Unspecified osteoarthritis, unspecified site: M19.90

## 2021-09-07 HISTORY — PX: BRONCHIAL BRUSHINGS: SHX5108

## 2021-09-07 LAB — CBC
HCT: 37.7 % — ABNORMAL LOW (ref 39.0–52.0)
Hemoglobin: 12.7 g/dL — ABNORMAL LOW (ref 13.0–17.0)
MCH: 32.3 pg (ref 26.0–34.0)
MCHC: 33.7 g/dL (ref 30.0–36.0)
MCV: 95.9 fL (ref 80.0–100.0)
Platelets: 231 10*3/uL (ref 150–400)
RBC: 3.93 MIL/uL — ABNORMAL LOW (ref 4.22–5.81)
RDW: 13.2 % (ref 11.5–15.5)
WBC: 7.5 10*3/uL (ref 4.0–10.5)
nRBC: 0 % (ref 0.0–0.2)

## 2021-09-07 LAB — COMPREHENSIVE METABOLIC PANEL
ALT: 13 U/L (ref 0–44)
AST: 11 U/L — ABNORMAL LOW (ref 15–41)
Albumin: 3.5 g/dL (ref 3.5–5.0)
Alkaline Phosphatase: 45 U/L (ref 38–126)
Anion gap: 7 (ref 5–15)
BUN: 16 mg/dL (ref 8–23)
CO2: 23 mmol/L (ref 22–32)
Calcium: 8.9 mg/dL (ref 8.9–10.3)
Chloride: 107 mmol/L (ref 98–111)
Creatinine, Ser: 1.13 mg/dL (ref 0.61–1.24)
GFR, Estimated: >60 mL/min (ref >60)
Glucose, Bld: 110 mg/dL — ABNORMAL HIGH (ref 70–99)
Potassium: 4 mmol/L (ref 3.5–5.1)
Sodium: 137 mmol/L (ref 135–145)
Total Bilirubin: 0.7 mg/dL (ref 0.3–1.2)
Total Protein: 6.4 g/dL — ABNORMAL LOW (ref 6.5–8.1)

## 2021-09-07 IMAGING — DX DG CHEST 1V PORT
1 series · 1 of 1 positions shown · non-contrast
Comparison: CT chest dated [DATE]. Chest x-ray dated
[DATE].

CLINICAL DATA: Post bronchoscopy.

EXAM:
PORTABLE CHEST 1 VIEW

[chest]
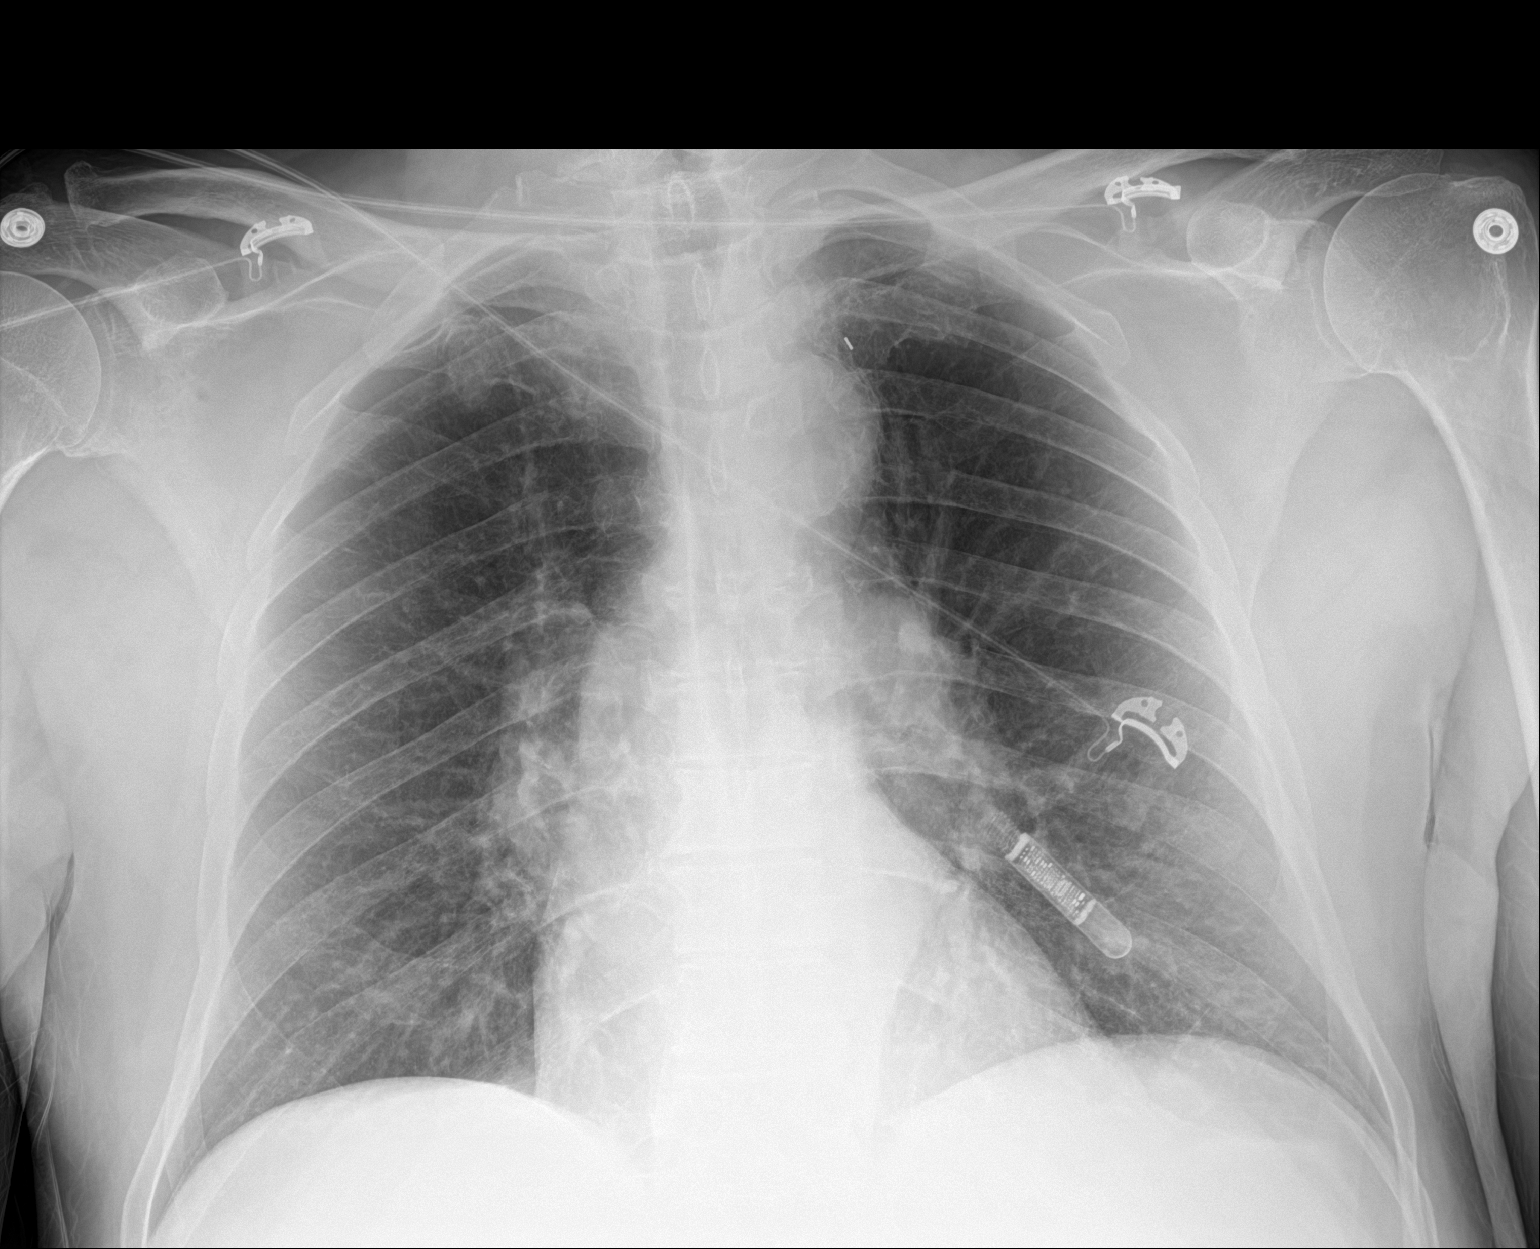

[1 of 1 positions shown; findings below may reference images not displayed]

FINDINGS: The heart size and mediastinal contours are within normal limits.
Unchanged loop recorder. Normal pulmonary vascularity. New fiducial
marker in the medial left upper lobe. No focal consolidation,
pleural effusion, or pneumothorax. No acute osseous abnormality.
IMPRESSION: No active disease.

## 2021-09-07 SURGERY — VIDEO BRONCHOSCOPY WITH ENDOBRONCHIAL NAVIGATION
Anesthesia: General | Laterality: Bilateral

## 2021-09-07 MED ORDER — CHLORHEXIDINE GLUCONATE 0.12 % MT SOLN
OROMUCOSAL | Status: AC
  Administered 2021-09-07: 15 mL via OROMUCOSAL
  Filled 2021-09-07: qty 15

## 2021-09-07 MED ORDER — CHLORHEXIDINE GLUCONATE 0.12 % MT SOLN: 15.0000 mL | OROMUCOSAL | Status: AC

## 2021-09-07 MED ORDER — MEPERIDINE HCL 100 MG/ML IJ SOLN: 6.2500 mg | INTRAMUSCULAR | Status: DC | PRN

## 2021-09-07 MED ORDER — FENTANYL CITRATE (PF) 100 MCG/2ML IJ SOLN: 25.0000 ug | INTRAMUSCULAR | Status: DC | PRN

## 2021-09-07 MED ORDER — ESMOLOL HCL 100 MG/10ML IV SOLN
INTRAVENOUS | Status: DC | PRN
  Administered 2021-09-07: 20 mg via INTRAVENOUS
  Administered 2021-09-07: 30 mg via INTRAVENOUS

## 2021-09-07 MED ORDER — ACETAMINOPHEN 160 MG/5ML PO SOLN: 325.0000 mg | ORAL | Status: DC | PRN

## 2021-09-07 MED ORDER — FENTANYL CITRATE (PF) 250 MCG/5ML IJ SOLN
INTRAMUSCULAR | Status: DC | PRN
  Administered 2021-09-07: 50 ug via INTRAVENOUS
  Administered 2021-09-07 (×2): 25 ug via INTRAVENOUS

## 2021-09-07 MED ORDER — OXYCODONE HCL 5 MG/5ML PO SOLN: 5.0000 mg | Freq: Once | ORAL | Status: DC | PRN

## 2021-09-07 MED ORDER — ONDANSETRON HCL 4 MG/2ML IJ SOLN: 4.0000 mg | Freq: Once | INTRAMUSCULAR | Status: DC | PRN

## 2021-09-07 MED ORDER — ACETAMINOPHEN 325 MG PO TABS: 325.0000 mg | ORAL_TABLET | ORAL | Status: DC | PRN

## 2021-09-07 MED ORDER — SUGAMMADEX SODIUM 200 MG/2ML IV SOLN
INTRAVENOUS | Status: DC | PRN
  Administered 2021-09-07: 200 mg via INTRAVENOUS

## 2021-09-07 MED ORDER — LACTATED RINGERS IV SOLN: INTRAVENOUS | Status: DC

## 2021-09-07 MED ORDER — TRELEGY ELLIPTA 100-62.5-25 MCG/ACT IN AEPB: 1.0000 | INHALATION_SPRAY | Freq: Every day | RESPIRATORY_TRACT | 0 refills | Status: DC

## 2021-09-07 MED ORDER — DEXAMETHASONE SODIUM PHOSPHATE 10 MG/ML IJ SOLN
INTRAMUSCULAR | Status: DC | PRN
  Administered 2021-09-07: 10 mg via INTRAVENOUS

## 2021-09-07 MED ORDER — OXYCODONE HCL 5 MG PO TABS: 5.0000 mg | ORAL_TABLET | Freq: Once | ORAL | Status: DC | PRN

## 2021-09-07 MED ORDER — ROCURONIUM BROMIDE 10 MG/ML (PF) SYRINGE
PREFILLED_SYRINGE | INTRAVENOUS | Status: DC | PRN
  Administered 2021-09-07: 60 mg via INTRAVENOUS

## 2021-09-07 MED ORDER — ONDANSETRON HCL 4 MG/2ML IJ SOLN
INTRAMUSCULAR | Status: DC | PRN
  Administered 2021-09-07: 4 mg via INTRAVENOUS

## 2021-09-07 MED ORDER — PROPOFOL 10 MG/ML IV BOLUS
INTRAVENOUS | Status: DC | PRN
  Administered 2021-09-07: 120 mg via INTRAVENOUS

## 2021-09-07 SURGICAL SUPPLY — 46 items

## 2021-09-07 NOTE — Interval H&P Note (Signed)
History and Physical Interval Note:  09/07/2021 8:36 AM  Bruce Sullivan  has presented today for surgery, with the diagnosis of lung nodules.  The various methods of treatment have been discussed with the patient and family. After consideration of risks, benefits and other options for treatment, the patient has consented to  Procedure(s) with comments: [place] (Bilateral) - ION, with fiducial placement as a surgical intervention.  The patient's history has been reviewed, patient examined, no change in status, stable for surgery.  I have reviewed the patient's chart and labs.  Questions were answered to the patient's satisfaction.     [place]

## 2021-09-07 NOTE — Anesthesia Postprocedure Evaluation (Signed)
Anesthesia Post Note  Patient: Bruce Sullivan  Procedure(s) Performed: VIDEO BRONCHOSCOPY WITH ENDOBRONCHIAL NAVIGATION (Bilateral) BRONCHIAL NEEDLE ASPIRATION BIOPSIES BRONCHIAL BRUSHINGS BRONCHIAL BIOPSIES FIDUCIAL MARKER PLACEMENT     Patient location during evaluation: PACU Anesthesia Type: General Level of consciousness: awake and alert Pain management: pain level controlled Vital Signs Assessment: post-procedure vital signs reviewed and stable Respiratory status: spontaneous breathing, nonlabored ventilation, respiratory function stable and patient connected to nasal cannula oxygen Cardiovascular status: blood pressure returned to baseline and stable Postop Assessment: no apparent nausea or vomiting Anesthetic complications: no   No notable events documented.  Last Vitals:  Vitals:   09/07/21 1031 09/07/21 1046  BP: 133/83 128/84  Pulse:  90  Resp: 20 (!) 21  Temp:  37.2 C  SpO2: 93% 92%    Last Pain:  Vitals:   09/07/21 1046  TempSrc:   PainSc: 0-No pain                 Undra Trembath

## 2021-09-07 NOTE — Discharge Instructions (Signed)
Flexible Bronchoscopy, Care After This sheet gives you information about how to care for yourself after your test. Your doctor may also give you more specific instructions. If you have problems or questions, contact your doctor. Follow these instructions at home: Eating and drinking Do not eat or drink anything (not even water) for 2 hours after your test, or until your numbing medicine (local anesthetic) wears off. When your numbness is gone and your cough and gag reflexes have come back, you may: Eat only soft foods. Slowly drink liquids. The day after the test, go back to your normal diet. Driving Do not drive for 24 hours if you were given a medicine to help you relax (sedative). Do not drive or use heavy machinery while taking prescription pain medicine. General instructions  Take over-the-counter and prescription medicines only as told by your doctor. Return to your normal activities as told. Ask what activities are safe for you. Do not use any products that have nicotine or tobacco in them. This includes cigarettes and e-cigarettes. If you need help quitting, ask your doctor. Keep all follow-up visits as told by your doctor. This is important. It is very important if you had a tissue sample (biopsy) taken. Get help right away if: You have shortness of breath that gets worse. You get light-headed. You feel like you are going to pass out (faint). You have chest pain. You cough up: More than a little blood. More blood than before. Summary Do not eat or drink anything (not even water) for 2 hours after your test, or until your numbing medicine wears off. Do not use cigarettes. Do not use e-cigarettes. Get help right away if you have chest pain.  This information is not intended to replace advice given to you by your health care provider. Make sure you discuss any questions you have with your health care provider. Document Released: 08/20/2009 Document Revised: 10/05/2017 Document  Reviewed: 11/10/2016 Elsevier Patient Education  2020 [employer].

## 2021-09-07 NOTE — Transfer of Care (Signed)
Immediate Anesthesia Transfer of Care Note  Patient: Bruce Sullivan  Procedure(s) Performed: VIDEO BRONCHOSCOPY WITH ENDOBRONCHIAL NAVIGATION (Bilateral) BRONCHIAL NEEDLE ASPIRATION BIOPSIES BRONCHIAL BRUSHINGS BRONCHIAL BIOPSIES FIDUCIAL MARKER PLACEMENT  Patient Location: PACU  Anesthesia Type:General  Level of Consciousness: drowsy  Airway & Oxygen Therapy: Patient Spontanous Breathing and Patient connected to nasal cannula oxygen  Post-op Assessment: Report given to RN and Post -op Vital signs reviewed and stable  Post vital signs: Reviewed and stable  Last Vitals:  Vitals Value Taken Time  BP 138/87 09/07/21 1016  Temp    Pulse 97 09/07/21 1017  Resp 22 09/07/21 1017  SpO2 94 % 09/07/21 1017  Vitals shown include unvalidated device data.  Last Pain:  Vitals:   09/07/21 0734  TempSrc:   PainSc: 0-No pain      Patients Stated Pain Goal: 0 (31/49/70 2637)  Complications: No notable events documented.

## 2021-09-07 NOTE — Anesthesia Procedure Notes (Signed)
Procedure Name: Intubation Date/Time: 09/07/2021 8:50 AM Performed by: Imagene Riches, CRNA Pre-anesthesia Checklist: Patient identified, Emergency Drugs available, Suction available and Patient being monitored Patient Re-evaluated:Patient Re-evaluated prior to induction Oxygen Delivery Method: Circle System Utilized Preoxygenation: Pre-oxygenation with 100% oxygen Induction Type: IV induction Ventilation: Mask ventilation without difficulty Laryngoscope Size: Miller and 2 Grade View: Grade I Tube type: Oral Tube size: 8.5 mm Number of attempts: 1 Airway Equipment and Method: Stylet and Oral airway Placement Confirmation: ETT inserted through vocal cords under direct vision, positive ETCO2 and breath sounds checked- equal and bilateral Secured at: 23 cm Tube secured with: Tape Dental Injury: Teeth and Oropharynx as per pre-operative assessment

## 2021-09-07 NOTE — Op Note (Addendum)
Video Bronchoscopy with Robotic Assisted Bronchoscopic Navigation   Date of Operation: 09/07/2021   Pre-op Diagnosis: Left upper lobe pulmonary nodule  Post-op Diagnosis: Left upper lobe pulmonary nodule  Surgeon: Garner Nash, DO   Assistants: None   Anesthesia: General endotracheal anesthesia  Operation: Flexible video fiberoptic bronchoscopy with robotic assistance and biopsies.  Estimated Blood Loss: Minimal  Complications: None  Indications and History: Bruce Sullivan is a 70 y.o. male with history of left upper lobe pulmonary nodule. The risks, benefits, complications, treatment options and expected outcomes were discussed with the patient.  The possibilities of pneumothorax, pneumonia, reaction to medication, pulmonary aspiration, perforation of a viscus, bleeding, failure to diagnose a condition and creating a complication requiring transfusion or operation were discussed with the patient who freely signed the consent.    Description of Procedure: The patient was seen in the Preoperative Area, was examined and was deemed appropriate to proceed.  The patient was taken to [facility] endoscopy room 3, identified as Bruce Sullivan and the procedure verified as Flexible Video Fiberoptic Bronchoscopy.  A Time Out was held and the above information confirmed.   Prior to the date of the procedure a high-resolution CT scan of the chest was performed. Utilizing ION software program a virtual tracheobronchial tree was generated to allow the creation of distinct navigation pathways to the patient's parenchymal abnormalities. After being taken to the operating room general anesthesia was initiated and the patient  was orally intubated. The video fiberoptic bronchoscope was introduced via the endotracheal tube and a general inspection was performed which showed normal right and left lung anatomy, aspiration of the bilateral mainstems was completed to remove any remaining secretions. Robotic catheter  inserted into patient's endotracheal tube.   Target #1 left upper lobe: The distinct navigation pathways prepared prior to this procedure were then utilized to navigate to patient's lesion identified on CT scan. The robotic catheter was secured into place and the vision probe was withdrawn.  Lesion location was approximated using fluoroscopy and radial endobronchial ultrasound for peripheral targeting. Under fluoroscopic guidance transbronchial needle brushings, transbronchial needle biopsies, and transbronchial forceps biopsies were performed to be sent for cytology and pathology.  Following tissue sampling a single fiducial was placed at approximately 2 cm from the lesion as defined by software using a fiducial catheter wire and delivery kit.  At the end of the procedure a general airway inspection was performed and there was no evidence of active bleeding. The bronchoscope was removed.  The patient tolerated the procedure well. There was no significant blood loss and there were no obvious complications. A post-procedural chest x-ray is pending.  Samples Target #1: 1. Transbronchial needle brushings from left upper lobe 2. Transbronchial Wang needle biopsies from left upper lobe 3. Transbronchial forceps biopsies from left upper lobe  Plans:  The patient will be discharged from the PACU to home when recovered from anesthesia and after chest x-ray is reviewed. We will review the cytology, pathology and microbiology results with the patient when they become available. Outpatient followup will be with Octavio Graves Windle Huebert, DO.    Confirmation of tool-in-lesion LUL nodule.     [facility] 09/07/2021 10:15 AM

## 2021-09-07 NOTE — Telephone Encounter (Signed)
[place], LPN called Dr. Valeta Harms and per Dr. Valeta Harms, provide pt with either Trelegy or Breztri whichever samples that we have.  Went to check and we did have trelegy samples. Went out to the lobby to see pt's wife Bruce Sullivan and gave her the samples. Nothing further needed.

## 2021-09-11 ENCOUNTER — Encounter (HOSPITAL_COMMUNITY): Payer: Self-pay | Admitting: Pulmonary Disease

## 2021-09-12 ENCOUNTER — Other Ambulatory Visit: Payer: Self-pay

## 2021-09-12 ENCOUNTER — Ambulatory Visit (INDEPENDENT_AMBULATORY_CARE_PROVIDER_SITE_OTHER): Payer: No Typology Code available for payment source | Admitting: Primary Care

## 2021-09-12 ENCOUNTER — Encounter: Payer: Self-pay | Admitting: Primary Care

## 2021-09-12 VITALS — BP 132/76 | HR 83 | Temp 98.8°F | Ht 69.0 in | Wt 192.6 lb

## 2021-09-12 DIAGNOSIS — Z23 Encounter for immunization: Secondary | ICD-10-CM

## 2021-09-12 DIAGNOSIS — J449 Chronic obstructive pulmonary disease, unspecified: Secondary | ICD-10-CM

## 2021-09-12 DIAGNOSIS — R911 Solitary pulmonary nodule: Secondary | ICD-10-CM

## 2021-09-12 DIAGNOSIS — J441 Chronic obstructive pulmonary disease with (acute) exacerbation: Secondary | ICD-10-CM | POA: Diagnosis not present

## 2021-09-12 LAB — CYTOLOGY - NON PAP

## 2021-09-12 MED ORDER — TRELEGY ELLIPTA 100-62.5-25 MCG/ACT IN AEPB: 1.0000 | INHALATION_SPRAY | Freq: Every day | RESPIRATORY_TRACT | 5 refills | Status: DC

## 2021-09-12 NOTE — Patient Instructions (Addendum)
Cytology from bronchoscopy is pending, we will call you with results once back  Recommendations: - Continue to use Trelegy Ellipta 1 puff daily in the morning (do not use more than prescribed) - Use Albuterol nebulizer every 4-6 hours as needed for breakthrough shortness of breath/wheezing ONLY - Use incentive spirometry 3-4 times a day to practice deep breathing exercises   Follow-up: - 3 months with Dr. Valeta Harms with PFTs prior

## 2021-09-12 NOTE — Progress Notes (Signed)
@Patient  ID: Bruce Sullivan, male    DOB: 24-Jan-1951, 70 y.o.   MRN: [id number]  Chief Complaint  Patient presents with   Follow-up    Bronch f/u    Referring provider: Wenda Low, MD  HPI: 70 year old male, former smoker.  Past medical history significant for COPD, lung nodule, hypertension, hyperlipidemia, hx CVA, GERD, seizure disorder, chronic kidney disease, BPH.   09/12/2021- Interim hx  Patient presents today for follow-up. Accompanied by his wife. Patient underwent flexible video fiberoptic bronchoscopy with robotic assistance for bx/tissue sample LUL pulmonary nodule on 09/07/21 with Dr. Valeta Harms. Cytology is still pending. After procedure she was told to come by our office for sample Triple therapy inhaler.   He is doing alright today without acute complaints. Breathing is ablout the same. Feels trelegy works better than [employer] but he has been taking trelegy more than prescribed. He is also using albuterol nebulizer upwards for 6 times a day. No complications from bronchoscopy. Denies hemoptysis or chest pain.    Allergies  Allergen Reactions   Bee Venom Anaphylaxis    Face gets red and hard to breathe     Immunization History  Administered Date(s) Administered   Influenza, High Dose Seasonal PF 08/02/2018   Influenza-Unspecified 08/07/2019   Moderna Sars-Covid-2 Vaccination 01/24/2020, 02/21/2020   Pneumococcal Conjugate-13 06/05/2017   Pneumococcal Polysaccharide-23 11/06/2012    Past Medical History:  Diagnosis Date   Anxiety    Arthritis    Asthma    Atherosclerosis 01/16/2015   Noted on CXR   Bilateral lower extremity edema 01/16/2015   Chronic kidney disease (CKD), stage II (mild)    COPD (chronic obstructive pulmonary disease) (HCC)    Dementia (HCC)    Depression    Dyspnea    GERD (gastroesophageal reflux disease)    Gout    Hepatitis 12/2019   Hx Hep C positive in blood. Treated.   History of colon polyps    Hoarseness    Hyperlipidemia     Hypertension    Neck abscess 09/03/2016   Pre-diabetes    Pulmonary emphysema ([place])    Seizure ([place])    Stroke ([place]) 2014   Thyroid disease    TIA (transient ischemic attack)    Urinary frequency    Urine retention    UTI (urinary tract infection)     Tobacco History: Social History   Tobacco Use  Smoking Status Former   Types: E-cigarettes   Quit date: 01/2014   Years since quitting: 7.6  Smokeless Tobacco Never   Counseling given: Not Answered   Outpatient Medications Prior to Visit  Medication Sig Dispense Refill   albuterol (PROVENTIL) (2.5 MG/3ML) 0.083% nebulizer solution Take 3 mLs (2.5 mg total) by nebulization every 6 (six) hours as needed for wheezing or shortness of breath. 75 mL 12   albuterol (VENTOLIN HFA) 108 (90 Base) MCG/ACT inhaler Inhale 1-2 puffs into the lungs every 6 (six) hours as needed for wheezing or shortness of breath. 1 each 0   allopurinol (ZYLOPRIM) 100 MG tablet Take 100 mg by mouth 2 (two) times daily.     atorvastatin (LIPITOR) 10 MG tablet Take 10 mg by mouth at bedtime.     budesonide-formoterol (SYMBICORT) 80-4.5 MCG/ACT inhaler Inhale 2 puffs into the lungs 2 (two) times daily.     chlorthalidone (HYGROTON) 50 MG tablet Take 50 mg by mouth daily.     FLUoxetine (PROZAC) 20 MG capsule Take 20 mg by mouth daily.  Fluticasone-Umeclidin-Vilant (TRELEGY ELLIPTA) 100-62.5-25 MCG/ACT AEPB Inhale 1 puff into the lungs daily. 14 each 0   HYDROcodone-acetaminophen (NORCO) 5-325 MG tablet Take 1 tablet by mouth every 4 (four) hours as needed for moderate pain. 15 tablet 0   omeprazole (PRILOSEC) 20 MG capsule Take 20 mg by mouth daily.     tamsulosin (FLOMAX) 0.4 MG CAPS capsule Take 0.4 mg by mouth daily.     predniSONE (DELTASONE) 10 MG tablet Take 4 tabs by mouth once daily x4 days, then 3 tabs x4 days, 2 tabs x4 days, 1 tab x4 days and stop. 40 tablet 0   aspirin EC 81 MG tablet Take 81 mg by mouth daily. Swallow whole. (Patient not taking:  Reported on 09/12/2021)     mometasone-formoterol (DULERA) 200-5 MCG/ACT AERO Inhale 2 puffs into the lungs at bedtime. (Patient not taking: Reported on 09/12/2021)     No facility-administered medications prior to visit.   Review of Systems  Review of Systems  Constitutional: Negative.   HENT: Negative.    Respiratory:  Positive for shortness of breath. Negative for cough, chest tightness and wheezing.     Physical Exam  BP 132/76 (BP Location: Left Arm, Cuff Size: Normal)   Pulse 83   Temp 98.8 F (37.1 C) (Oral)   Ht 5\' 9"  (1.753 m)   Wt 192 lb 9.6 oz (87.4 kg)   SpO2 98%   BMI 28.44 kg/m  Physical Exam Constitutional:      Appearance: Normal appearance.  HENT:     Head: Normocephalic and atraumatic.     Mouth/Throat:     Mouth: Mucous membranes are moist.     Pharynx: Oropharynx is clear.  Cardiovascular:     Rate and Rhythm: Normal rate and regular rhythm.  Pulmonary:     Effort: Pulmonary effort is normal.     Breath sounds: Normal breath sounds. No wheezing or rales.     Comments: CTA, diminished  Skin:    General: Skin is warm and dry.  Neurological:     General: No focal deficit present.     Mental Status: He is alert and oriented to person, place, and time. Mental status is at baseline.  Psychiatric:        Mood and Affect: Mood normal.        Behavior: Behavior normal.        Thought Content: Thought content normal.        Judgment: Judgment normal.     Lab Results:  CBC    Component Value Date/Time   WBC 7.5 09/07/2021 0738   RBC 3.93 (L) 09/07/2021 0738   HGB 12.7 (L) 09/07/2021 0738   HCT 37.7 (L) 09/07/2021 0738   PLT 231 09/07/2021 0738   MCV 95.9 09/07/2021 0738   MCH 32.3 09/07/2021 0738   MCHC 33.7 09/07/2021 0738   RDW 13.2 09/07/2021 0738   LYMPHSABS 4.0 07/03/2021 0849   MONOABS 0.5 07/03/2021 0849   EOSABS 0.3 07/03/2021 0849   BASOSABS 0.0 07/03/2021 0849    BMET    Component Value Date/Time   NA 137 09/07/2021 0738   K  4.0 09/07/2021 0738   CL 107 09/07/2021 0738   CO2 23 09/07/2021 0738   GLUCOSE 110 (H) 09/07/2021 0738   BUN 16 09/07/2021 0738   CREATININE 1.13 09/07/2021 0738   CALCIUM 8.9 09/07/2021 0738   GFRNONAA >60 09/07/2021 0738   GFRAA >60 09/16/2018 1255    BNP    Component Value  Date/Time   BNP 24.4 12/27/2020 0825    ProBNP No results found for: PROBNP  Imaging: DG CHEST PORT 1 VIEW  Result Date: 09/07/2021 CLINICAL DATA:  Post bronchoscopy. EXAM: PORTABLE CHEST 1 VIEW COMPARISON:  CT chest dated July 26, 2021. Chest x-ray dated July 03, 2021. FINDINGS: The heart size and mediastinal contours are within normal limits. Unchanged loop recorder. Normal pulmonary vascularity. New fiducial marker in the medial left upper lobe. No focal consolidation, pleural effusion, or pneumothorax. No acute osseous abnormality. IMPRESSION: No active disease. Electronically Signed   By: Titus Dubin M.D.   On: 09/07/2021 10:37   DG C-ARM BRONCHOSCOPY  Result Date: 09/07/2021 C-ARM BRONCHOSCOPY: Fluoroscopy was utilized by the requesting physician.  No radiographic interpretation.     Assessment & Plan:   Lung nodule - S/p bronchoscopy on 09/07/21 with Dr. Valeta Harms for LUL pulmonary nodules, cytology is still pending.   Chronic obstructive pulmonary disease ([place]) - Former heavy smoker. Start on Trelegy 140mcg daily. Overusing SABA. Does not appear acutely exacerbated today. Reinforced proper inhaler use. Sending in DME order for incentive spirometer. Needs to reschedule PFTs. FU in 3 months or sooner if needed   Martyn Ehrich, NP 09/12/2021

## 2021-09-12 NOTE — Assessment & Plan Note (Signed)
-   S/p bronchoscopy on 09/07/21 with Dr. Valeta Harms for LUL pulmonary nodules, cytology is still pending.

## 2021-09-12 NOTE — Assessment & Plan Note (Addendum)
-   Former heavy smoker. Start on Trelegy 142mcg daily. Overusing SABA. Does not appear acutely exacerbated today. Reinforced proper inhaler use. Sending in DME order for incentive spirometer. Needs to reschedule PFTs. Patient received high dose influenza vaccine today. FU in 3 months or sooner if needed

## 2021-09-13 DIAGNOSIS — I10 Essential (primary) hypertension: Secondary | ICD-10-CM | POA: Diagnosis not present

## 2021-09-13 DIAGNOSIS — B182 Chronic viral hepatitis C: Secondary | ICD-10-CM | POA: Diagnosis not present

## 2021-09-13 DIAGNOSIS — G459 Transient cerebral ischemic attack, unspecified: Secondary | ICD-10-CM | POA: Diagnosis not present

## 2021-09-13 DIAGNOSIS — R972 Elevated prostate specific antigen [PSA]: Secondary | ICD-10-CM | POA: Diagnosis not present

## 2021-09-13 DIAGNOSIS — F331 Major depressive disorder, recurrent, moderate: Secondary | ICD-10-CM | POA: Diagnosis not present

## 2021-09-13 DIAGNOSIS — R911 Solitary pulmonary nodule: Secondary | ICD-10-CM | POA: Diagnosis not present

## 2021-09-13 DIAGNOSIS — G40909 Epilepsy, unspecified, not intractable, without status epilepticus: Secondary | ICD-10-CM | POA: Diagnosis not present

## 2021-09-13 DIAGNOSIS — J449 Chronic obstructive pulmonary disease, unspecified: Secondary | ICD-10-CM | POA: Diagnosis not present

## 2021-09-14 ENCOUNTER — Ambulatory Visit: Payer: No Typology Code available for payment source | Admitting: Pulmonary Disease

## 2021-09-14 ENCOUNTER — Telehealth: Payer: Self-pay | Admitting: Primary Care

## 2021-09-14 MED ORDER — FLUTICASONE-SALMETEROL 250-50 MCG/ACT IN AEPB: 1.0000 | INHALATION_SPRAY | Freq: Two times a day (BID) | RESPIRATORY_TRACT | 5 refills | Status: DC

## 2021-09-14 MED ORDER — SPIRIVA RESPIMAT 2.5 MCG/ACT IN AERS: 2.0000 | INHALATION_SPRAY | Freq: Every day | RESPIRATORY_TRACT | 2 refills | Status: DC

## 2021-09-14 NOTE — Telephone Encounter (Signed)
Spiriva 2.46mcg please, thanks for asking

## 2021-09-14 NOTE — Telephone Encounter (Signed)
Called Bruce Sullivan but she they did not answer.   RX for Grant Ruts has been sent to the [facility].  Beth, did you want him to have Spiriva 1.48mcg or Spiriva 2.30mcg?

## 2021-09-14 NOTE — Telephone Encounter (Signed)
Refill sent.   Nothing further needed at this time.

## 2021-09-14 NOTE — Telephone Encounter (Signed)
Yes, please send RX for wixela 250/50 and Spiriva to [facility] (in place of Trelegy 100)

## 2021-09-14 NOTE — Telephone Encounter (Signed)
EW please advise if we can change to the formulary for the [place]     thanks

## 2021-09-15 ENCOUNTER — Telehealth: Payer: Self-pay | Admitting: Pulmonary Disease

## 2021-09-15 DIAGNOSIS — R911 Solitary pulmonary nodule: Secondary | ICD-10-CM

## 2021-09-15 NOTE — Telephone Encounter (Signed)
I have called the pts wife and she is not sure who called this morning.  BI any ideas on results?  I did not see any results posted in his chart.  thanks

## 2021-09-15 NOTE — Telephone Encounter (Signed)
-----   Message from Garner Nash, DO sent at 09/15/2021  8:30 AM EST ----- Regarding: RE: Post bronch f/u I tried calling the patient this morning. I left a voicemail for them to call us back. Pathology results positive for adenocarcinoma. He needs a referral to radiation oncology. I did not leave details of the positive pathology results on the voicemail. Would prefer that they're told this in person. I am not in the office today if they call back. Maybe Eustaquio Maize can speak with them or I can try to reach them tomorrow.  Can some from triage put that in a phone note so we can track our attempts? (I am working from my phone sorry)   thanks  [place] ----- Message ----- From: Martyn Ehrich, NP Sent: 09/12/2021  10:14 AM EST To: Garner Nash, DO Subject: Post bronch f/u                                I saw patient today, his PFTs and OV with you were cancelled on 11/9  Cytology is not back, we will need to follow-up on this   He is doing fine, overusing Trelegy and albuterol. Does not appear acutely exacerbated.   -Beth

## 2021-09-15 NOTE — Telephone Encounter (Signed)
I called the patient and she wants you to give her a call to give her a little more information on the results before we place the referral to Radiation Oncology. [phone] is the best number for the wife.

## 2021-09-16 NOTE — Telephone Encounter (Signed)
PCCM:  I called and spoke with the patient regarding pathology results.   Referral placed to radiation oncology   [facility] 09/16/2021 10:13 AM

## 2021-09-26 ENCOUNTER — Telehealth: Payer: Self-pay | Admitting: Pulmonary Disease

## 2021-09-26 NOTE — Telephone Encounter (Signed)
I called the patient and she is aware we are waiting on the [place] and she wants to know what stage his cancer is. Please advise.

## 2021-09-27 ENCOUNTER — Encounter: Attending: Hematology & Oncology | Primary: Family Medicine

## 2021-09-27 ENCOUNTER — Encounter: Primary: Family Medicine

## 2021-09-27 NOTE — Telephone Encounter (Signed)
Spoke with pt's wife (per DPR) and relayed messages. Wife stated understanding. Nothing further needed at this time.

## 2021-09-27 NOTE — Telephone Encounter (Signed)
Separate encounter open by BI where he called and spoke with pt. Closing this encounter.

## 2021-09-28 LAB — FECAL DNA COLORECTAL CANCER SCREENING (COLOGUARD): FIT-DNA (Cologuard): POSITIVE — AB

## 2021-10-06 DIAGNOSIS — C3490 Malignant neoplasm of unspecified part of unspecified bronchus or lung: Secondary | ICD-10-CM | POA: Diagnosis not present

## 2021-10-06 DIAGNOSIS — I10 Essential (primary) hypertension: Secondary | ICD-10-CM | POA: Diagnosis not present

## 2021-10-19 NOTE — Progress Notes (Signed)
Thoracic Location of Tumor / Histology: Left Upper Lobe Lung  Patient presented following repeat scans for lung nodules that have been in surveillance since March 2022.  PET 07/26/2021: Enlarging hypermetabolic pulmonary nodule in the medial LEFT upper lobe is most concerning for bronchogenic carcinoma.  No evidence of metastatic mediastinal lymphadenopathy or distant metastatic disease.  CT Super D Chest 07/26/2021: LEFT upper lobe pulmonary nodule of concern measures 15 mm x 11 mm (image 29/series 7) compared to 11 mm x 8 mm on PET-CT scan 01/18/2021. This nodule was hypermetabolic on same day PET-CT scan. No evidence metastatic lymphadenopathy.  High-density cysts of the RIGHT kidney are favored benign proteinaceous cyst.  Biopsies of Left Upper Lobe Lung 09/07/2021    Tobacco/Marijuana/Snuff/ETOH use: Former Smoker, quit in 2015.  Past/Anticipated interventions by cardiothoracic surgery, if any:   Past/Anticipated interventions by medical oncology, if any:   Signs/Symptoms Weight changes, if any: steady. Respiratory complaints, if any: Notes significant SOB, using inhaler all the time per wife. Hemoptysis, if any: Notes non-productive cough.  Denies hemoptysis. Pain issues, if any:  No  SAFETY ISSUES: Prior radiation? No Pacemaker/ICD?  No Possible current pregnancy? N/a Is the patient on methotrexate? No  Current Complaints / other details:  -DC- had loop recorder placed 2014-2015, however the battery is dead.

## 2021-10-20 ENCOUNTER — Ambulatory Visit
Admission: RE | Admit: 2021-10-20 | Discharge: 2021-10-20 | Disposition: A | Discharge status: Home / Self Care | Payer: No Typology Code available for payment source | Source: Ambulatory Visit | Attending: Radiation Oncology | Admitting: Radiation Oncology

## 2021-10-20 ENCOUNTER — Other Ambulatory Visit: Payer: Self-pay

## 2021-10-20 ENCOUNTER — Encounter: Payer: Self-pay | Admitting: Radiation Oncology

## 2021-10-20 VITALS — BP 145/87 | HR 73 | Temp 97.1°F | Resp 18 | Ht 69.0 in | Wt 193.5 lb

## 2021-10-20 DIAGNOSIS — M129 Arthropathy, unspecified: Secondary | ICD-10-CM | POA: Diagnosis not present

## 2021-10-20 DIAGNOSIS — I2510 Atherosclerotic heart disease of native coronary artery without angina pectoris: Secondary | ICD-10-CM | POA: Diagnosis not present

## 2021-10-20 DIAGNOSIS — C3412 Malignant neoplasm of upper lobe, left bronchus or lung: Secondary | ICD-10-CM

## 2021-10-20 DIAGNOSIS — I129 Hypertensive chronic kidney disease with stage 1 through stage 4 chronic kidney disease, or unspecified chronic kidney disease: Secondary | ICD-10-CM | POA: Insufficient documentation

## 2021-10-20 DIAGNOSIS — Z87891 Personal history of nicotine dependence: Secondary | ICD-10-CM | POA: Insufficient documentation

## 2021-10-20 DIAGNOSIS — Z79899 Other long term (current) drug therapy: Secondary | ICD-10-CM | POA: Insufficient documentation

## 2021-10-20 DIAGNOSIS — N182 Chronic kidney disease, stage 2 (mild): Secondary | ICD-10-CM | POA: Diagnosis not present

## 2021-10-21 DIAGNOSIS — C3412 Malignant neoplasm of upper lobe, left bronchus or lung: Secondary | ICD-10-CM | POA: Insufficient documentation

## 2021-10-21 NOTE — Progress Notes (Signed)
Radiation Oncology         [phone] ________________________________  Name: Bruce Sullivan MRN: [id number]  Date: 10/20/2021  DOB: 11/18/1950  DT:OIZTIW, [place] Ar, MD  Garner Nash, DO     REFERRING PHYSICIAN: Garner Nash, DO   DIAGNOSIS: The primary encounter diagnosis was Primary malignant neoplasm of left upper lobe of lung ([place]). A diagnosis of Malignant neoplasm of bronchus of left upper lobe ([facility]) was also pertinent to this visit.   HISTORY OF PRESENT ILLNESS::Bruce Sullivan is a 70 y.o. male who is seen for an initial consultation visit regarding the patient's diagnosis of non-small cell lung cancer of the left lung.  The patient has been noted to have a suspicious abnormality within the left lung.  CT imaging in September revealed a 1.5 cm tumor which had increased in size.  Previously this was a 1.1 cm tumor in March 2022 when a PET scan was completed.  Low-level activity was seen at that time with a maximum SUV around 2.  Recent PET scan showed an SUV of 4.9.  This remains suspicious therefore given the growth characteristics and PET scan results and the patient proceeded with bronchoscopy.  Biopsy returned positive for adenocarcinoma consistent with non-small cell lung cancer.  The patient has been seen by cardiothoracic surgery and is not felt to be a good candidate for surgical resection.  I have therefore been asked to see the patient for consideration of stereotactic body radiation treatment to this lesion.    PREVIOUS RADIATION THERAPY: No   PAST MEDICAL HISTORY:  has a past medical history of Anxiety, Arthritis, Asthma, Atherosclerosis (01/16/2015), Bilateral lower extremity edema (01/16/2015), Chronic kidney disease (CKD), stage II (mild), COPD (chronic obstructive pulmonary disease) ([place]), Dementia ([place]), Depression, Dyspnea, GERD (gastroesophageal reflux disease), Gout, Hepatitis (12/2019), History of colon polyps, Hoarseness, Hyperlipidemia, Hypertension, Lung  cancer ([place]) (09/07/2021), Neck abscess (09/03/2016), Pre-diabetes, Pulmonary emphysema ([place]), Seizure ([place]), Stroke ([place]) (2014), Thyroid disease, TIA (transient ischemic attack), Urinary frequency, Urine retention, and UTI (urinary tract infection).     PAST SURGICAL HISTORY: Past Surgical History:  Procedure Laterality Date   BRONCHIAL BIOPSY  09/07/2021   Procedure: BRONCHIAL BIOPSIES;  Surgeon: Garner Nash, DO;  Location: [facility];  Service: Pulmonary;;   BRONCHIAL BRUSHINGS  09/07/2021   Procedure: BRONCHIAL BRUSHINGS;  Surgeon: Garner Nash, DO;  Location: [facility];  Service: Pulmonary;;   BRONCHIAL NEEDLE ASPIRATION BIOPSY  09/07/2021   Procedure: BRONCHIAL NEEDLE ASPIRATION BIOPSIES;  Surgeon: Garner Nash, DO;  Location: [facility];  Service: Pulmonary;;   COLONOSCOPY  08/29/2018   CYSTOSCOPY WITH BIOPSY N/A 11/20/2018   Procedure: CYSTOSCOPY WITH BLADDER BIOPSY;  Surgeon: Ceasar Mons, MD;  Location: [facility];  Service: Urology;  Laterality: N/A;   FIDUCIAL MARKER PLACEMENT  09/07/2021   Procedure: FIDUCIAL MARKER PLACEMENT;  Surgeon: Garner Nash, DO;  Location: [facility];  Service: Pulmonary;;   HERNIA REPAIR     t monitor     TRANSURETHRAL RESECTION OF PROSTATE N/A 11/20/2018   Procedure: TRANSURETHRAL RESECTION OF THE PROSTATE (TURP);  Surgeon: Ceasar Mons, MD;  Location[facility];  Service: Urology;  Laterality: N/A;   VIDEO BRONCHOSCOPY WITH ENDOBRONCHIAL NAVIGATION Bilateral 09/07/2021   Procedure: VIDEO BRONCHOSCOPY WITH ENDOBRONCHIAL NAVIGATION;  Surgeon: Garner Nash, DO;  Location: [place];  Service: Pulmonary;  Laterality: Bilateral;  ION, with fiducial placement     FAMILY HISTORY: family history is not on file.  SOCIAL HISTORY:  reports that he quit smoking about 7 years ago. His smoking use included e-cigarettes. He has never used smokeless tobacco. He reports that he  does not drink alcohol and does not use drugs.   ALLERGIES: Bee venom   MEDICATIONS:  Current Outpatient Medications  Medication Sig Dispense Refill   albuterol (PROVENTIL) (2.5 MG/3ML) 0.083% nebulizer solution Take 3 mLs (2.5 mg total) by nebulization every 6 (six) hours as needed for wheezing or shortness of breath. 75 mL 12   albuterol (VENTOLIN HFA) 108 (90 Base) MCG/ACT inhaler Inhale 1-2 puffs into the lungs every 6 (six) hours as needed for wheezing or shortness of breath. 1 each 0   allopurinol (ZYLOPRIM) 100 MG tablet Take 100 mg by mouth 2 (two) times daily.     atorvastatin (LIPITOR) 10 MG tablet Take 10 mg by mouth at bedtime.     chlorthalidone (HYGROTON) 50 MG tablet Take 50 mg by mouth daily.     FLUoxetine (PROZAC) 20 MG capsule Take 20 mg by mouth daily.     fluticasone-salmeterol (WIXELA INHUB) 250-50 MCG/ACT AEPB Inhale 1 puff into the lungs in the morning and at bedtime. 60 each 5   Fluticasone-Umeclidin-Vilant (TRELEGY ELLIPTA) 100-62.5-25 MCG/ACT AEPB Inhale 1 puff into the lungs daily. 14 each 0   Fluticasone-Umeclidin-Vilant (TRELEGY ELLIPTA) 100-62.5-25 MCG/ACT AEPB Inhale 1 puff into the lungs daily. 60 each 5   HYDROcodone-acetaminophen (NORCO) 5-325 MG tablet Take 1 tablet by mouth every 4 (four) hours as needed for moderate pain. 15 tablet 0   omeprazole (PRILOSEC) 20 MG capsule Take 20 mg by mouth daily.     tamsulosin (FLOMAX) 0.4 MG CAPS capsule Take 0.4 mg by mouth daily.     Tiotropium Bromide Monohydrate (SPIRIVA RESPIMAT) 2.5 MCG/ACT AERS Inhale 2 puffs into the lungs daily. 12 g 2   aspirin EC 81 MG tablet Take 81 mg by mouth daily. Swallow whole. (Patient not taking: Reported on 09/12/2021)     No current facility-administered medications for this encounter.     REVIEW OF SYSTEMS:  A 15 point review of systems is documented in the electronic medical record. This was obtained by the nursing staff. However, I reviewed this with the patient to discuss  relevant findings and make appropriate changes.  Pertinent items are noted in HPI.    PHYSICAL EXAM:  height is 5\' 9"  (1.753 m) and weight is 193 lb 8 oz (87.8 kg). His temporal temperature is 97.1 F (36.2 C) (abnormal). His blood pressure is 145/87 (abnormal) and his pulse is 73. His respiration is 18 and oxygen saturation is 99%.   ECOG = 1  0 - Asymptomatic (Fully active, able to carry on all predisease activities without restriction)  1 - Symptomatic but completely ambulatory (Restricted in physically strenuous activity but ambulatory and able to carry out work of a light or sedentary nature. For example, light housework, office work)  2 - Symptomatic, <50% in bed during the day (Ambulatory and capable of all self care but unable to carry out any work activities. Up and about more than 50% of waking hours)  3 - Symptomatic, >50% in bed, but not bedbound (Capable of only limited self-care, confined to bed or chair 50% or more of waking hours)  4 - Bedbound (Completely disabled. Cannot carry on any self-care. Totally confined to bed or chair)  5 - Death   Eustace Pen MM, Creech RH, Tormey DC, et al. [phone]). "Toxicity and response criteria of the [employer]". [place] Oncol. 5 (6): 649-55  Alert, no distress   LABORATORY DATA:  Lab Results  Component Value Date   WBC 7.5 09/07/2021   HGB 12.7 (L) 09/07/2021   HCT 37.7 (L) 09/07/2021   MCV 95.9 09/07/2021   PLT 231 09/07/2021   Lab Results  Component Value Date   NA 137 09/07/2021   K 4.0 09/07/2021   CL 107 09/07/2021   CO2 23 09/07/2021   Lab Results  Component Value Date   ALT 13 09/07/2021   AST 11 (L) 09/07/2021   GGT 25 12/18/2019   ALKPHOS 45 09/07/2021   BILITOT 0.7 09/07/2021      RADIOGRAPHY: No results found.     IMPRESSION/ PLAN:  The patient has a recent diagnosis of stage I non-small cell lung cancer of the left upper lobe.  The patient is not felt to be a good candidate for  resection by cardiothoracic surgery and therefore I have been asked to see the patient for consideration of stereotactic body radiation treatment.  This appears to be a good option for the patient and I discussed the rationale of such a treatment with him.  We discussed the pros and cons of treatment as well as side effects and risks.  All of his questions were answered and the patient does wish to proceed with this treatment in the near future.  He will undergo simulation for treatment planning.  Currently, I anticipate treating the patient potentially with a course of stereotactic body radiation treatment over 5 fractions.  The patient is aware that we may need to increase the number of fractions depending on the planning process with the location of the tumor quite medial within the left lung.   The patient was seen in person today in clinic.  The total time spent on the patient's visit today was 45 minutes, including chart review, direct discussion/evaluation with the patient, and coordination of care.        ________________________________   Jodelle Gross, MD, PhD   **Disclaimer: This note was dictated with voice recognition software. Similar sounding words can inadvertently be transcribed and this note may contain transcription errors which may not have been corrected upon publication of note.**

## 2021-10-27 ENCOUNTER — Other Ambulatory Visit: Payer: Self-pay

## 2021-10-27 ENCOUNTER — Ambulatory Visit
Admission: RE | Admit: 2021-10-27 | Discharge: 2021-10-27 | Disposition: A | Discharge status: Home / Self Care | Payer: No Typology Code available for payment source | Source: Ambulatory Visit | Attending: Radiation Oncology | Admitting: Radiation Oncology

## 2021-10-27 DIAGNOSIS — C3412 Malignant neoplasm of upper lobe, left bronchus or lung: Secondary | ICD-10-CM | POA: Insufficient documentation

## 2021-11-09 ENCOUNTER — Ambulatory Visit
Admission: RE | Admit: 2021-11-09 | Discharge: 2021-11-09 | Disposition: A | Discharge status: Home / Self Care | Payer: No Typology Code available for payment source | Source: Ambulatory Visit | Attending: Radiation Oncology | Admitting: Radiation Oncology

## 2021-11-09 ENCOUNTER — Other Ambulatory Visit: Payer: Self-pay

## 2021-11-09 DIAGNOSIS — C3412 Malignant neoplasm of upper lobe, left bronchus or lung: Secondary | ICD-10-CM | POA: Insufficient documentation

## 2021-11-10 ENCOUNTER — Ambulatory Visit: Payer: No Typology Code available for payment source | Admitting: Radiation Oncology

## 2021-11-10 ENCOUNTER — Ambulatory Visit
Admission: RE | Admit: 2021-11-10 | Discharge: 2021-11-10 | Disposition: A | Discharge status: Home / Self Care | Payer: No Typology Code available for payment source | Source: Ambulatory Visit | Attending: Radiation Oncology | Admitting: Radiation Oncology

## 2021-11-10 DIAGNOSIS — C3412 Malignant neoplasm of upper lobe, left bronchus or lung: Secondary | ICD-10-CM | POA: Diagnosis not present

## 2021-11-11 ENCOUNTER — Other Ambulatory Visit: Payer: Self-pay

## 2021-11-11 ENCOUNTER — Ambulatory Visit
Admission: RE | Admit: 2021-11-11 | Discharge: 2021-11-11 | Disposition: A | Discharge status: Home / Self Care | Payer: No Typology Code available for payment source | Source: Ambulatory Visit | Attending: Radiation Oncology | Admitting: Radiation Oncology

## 2021-11-11 DIAGNOSIS — C3412 Malignant neoplasm of upper lobe, left bronchus or lung: Secondary | ICD-10-CM | POA: Diagnosis not present

## 2021-11-14 ENCOUNTER — Ambulatory Visit
Admission: RE | Admit: 2021-11-14 | Discharge: 2021-11-14 | Disposition: A | Discharge status: Home / Self Care | Payer: No Typology Code available for payment source | Source: Ambulatory Visit | Attending: Radiation Oncology | Admitting: Radiation Oncology

## 2021-11-14 ENCOUNTER — Other Ambulatory Visit: Payer: Self-pay

## 2021-11-14 DIAGNOSIS — C3412 Malignant neoplasm of upper lobe, left bronchus or lung: Secondary | ICD-10-CM | POA: Diagnosis not present

## 2021-11-15 ENCOUNTER — Ambulatory Visit: Payer: No Typology Code available for payment source | Admitting: Radiation Oncology

## 2021-11-15 ENCOUNTER — Ambulatory Visit
Admission: RE | Admit: 2021-11-15 | Discharge: 2021-11-15 | Disposition: A | Discharge status: Home / Self Care | Payer: No Typology Code available for payment source | Source: Ambulatory Visit | Attending: Radiation Oncology | Admitting: Radiation Oncology

## 2021-11-15 DIAGNOSIS — C3412 Malignant neoplasm of upper lobe, left bronchus or lung: Secondary | ICD-10-CM | POA: Diagnosis not present

## 2021-11-16 ENCOUNTER — Other Ambulatory Visit: Payer: Self-pay

## 2021-11-16 ENCOUNTER — Ambulatory Visit
Admission: RE | Admit: 2021-11-16 | Discharge: 2021-11-16 | Disposition: A | Discharge status: Home / Self Care | Payer: No Typology Code available for payment source | Source: Ambulatory Visit | Attending: Radiation Oncology | Admitting: Radiation Oncology

## 2021-11-16 DIAGNOSIS — C3412 Malignant neoplasm of upper lobe, left bronchus or lung: Secondary | ICD-10-CM | POA: Diagnosis not present

## 2021-11-17 ENCOUNTER — Ambulatory Visit
Admission: RE | Admit: 2021-11-17 | Discharge: 2021-11-17 | Disposition: A | Discharge status: Home / Self Care | Payer: No Typology Code available for payment source | Source: Ambulatory Visit | Attending: Radiation Oncology | Admitting: Radiation Oncology

## 2021-11-17 DIAGNOSIS — C3412 Malignant neoplasm of upper lobe, left bronchus or lung: Secondary | ICD-10-CM | POA: Diagnosis not present

## 2021-11-18 ENCOUNTER — Ambulatory Visit
Admission: RE | Admit: 2021-11-18 | Discharge: 2021-11-18 | Disposition: A | Discharge status: Home / Self Care | Payer: No Typology Code available for payment source | Source: Ambulatory Visit | Attending: Radiation Oncology | Admitting: Radiation Oncology

## 2021-11-18 ENCOUNTER — Other Ambulatory Visit: Payer: Self-pay

## 2021-11-18 DIAGNOSIS — C3412 Malignant neoplasm of upper lobe, left bronchus or lung: Secondary | ICD-10-CM | POA: Diagnosis not present

## 2021-11-21 ENCOUNTER — Other Ambulatory Visit: Payer: Self-pay

## 2021-11-21 ENCOUNTER — Ambulatory Visit
Admission: RE | Admit: 2021-11-21 | Discharge: 2021-11-21 | Disposition: A | Discharge status: Home / Self Care | Payer: No Typology Code available for payment source | Source: Ambulatory Visit | Attending: Radiation Oncology | Admitting: Radiation Oncology

## 2021-11-21 DIAGNOSIS — C3412 Malignant neoplasm of upper lobe, left bronchus or lung: Secondary | ICD-10-CM | POA: Diagnosis not present

## 2021-11-22 ENCOUNTER — Encounter: Payer: Self-pay | Admitting: Radiation Oncology

## 2021-11-22 ENCOUNTER — Ambulatory Visit
Admission: RE | Admit: 2021-11-22 | Discharge: 2021-11-22 | Disposition: A | Discharge status: Home / Self Care | Payer: No Typology Code available for payment source | Source: Ambulatory Visit | Attending: Radiation Oncology | Admitting: Radiation Oncology

## 2021-11-22 DIAGNOSIS — C3412 Malignant neoplasm of upper lobe, left bronchus or lung: Secondary | ICD-10-CM | POA: Diagnosis not present

## 2021-11-23 ENCOUNTER — Other Ambulatory Visit: Payer: Self-pay | Admitting: Radiation Oncology

## 2021-11-23 DIAGNOSIS — C3412 Malignant neoplasm of upper lobe, left bronchus or lung: Secondary | ICD-10-CM

## 2021-11-23 NOTE — Progress Notes (Signed)
° °                                                                                                                                                          °  Patient Name: XAIVER ROSKELLEY MRN: [id number] DOB: Sep 16, 1951 Referring Physician: Wenda Low (Profile Not Attached) Date of Service: 11/22/2021 [facility], [place]                                                        End Of Treatment Note  Diagnoses: C34.12-Malignant neoplasm of upper lobe, left bronchus or lung  Cancer Staging: Stage IA2, cT1bN0M0, NSCLC, adenocarcinoma of the LUL  Intent: Curative  Radiation Treatment Dates:  11/09/2021 through 11/22/2021 Ultrahypofractionated Radiotherapy Site Technique Total Dose (Gy) Dose per Fx (Gy) Completed Fx Beam Energies  Lung, Left:  LUL  IMRT 65/65 6.5 10/10 6XFFF   Narrative: The patient tolerated radiation therapy relatively well.    Plan: The patient will receive a call in about one month from the radiation oncology department. I will order a new baseline CT scan to be performed in 6-8 weeks, and follow up with the results by phone. If stable we will move to 6 month follow up scans per NCCN guidelines.  ________________________________________________    Carola Rhine, PAC

## 2021-11-28 NOTE — Telephone Encounter (Signed)
Per pt request Called in Silodosin 8mg  1 po daily #30 w 4 rf to [facility].

## 2021-11-30 ENCOUNTER — Telehealth: Payer: Self-pay

## 2021-11-30 ENCOUNTER — Ambulatory Visit (INDEPENDENT_AMBULATORY_CARE_PROVIDER_SITE_OTHER): Payer: Medicare Other

## 2021-11-30 ENCOUNTER — Ambulatory Visit (INDEPENDENT_AMBULATORY_CARE_PROVIDER_SITE_OTHER): Payer: No Typology Code available for payment source | Admitting: Pulmonary Disease

## 2021-11-30 ENCOUNTER — Other Ambulatory Visit: Payer: Self-pay

## 2021-11-30 ENCOUNTER — Encounter: Payer: Self-pay | Admitting: Pulmonary Disease

## 2021-11-30 VITALS — BP 138/98 | HR 82 | Temp 98.9°F | Ht 70.0 in | Wt 187.0 lb

## 2021-11-30 DIAGNOSIS — J441 Chronic obstructive pulmonary disease with (acute) exacerbation: Secondary | ICD-10-CM | POA: Diagnosis not present

## 2021-11-30 DIAGNOSIS — C3492 Malignant neoplasm of unspecified part of left bronchus or lung: Secondary | ICD-10-CM

## 2021-11-30 DIAGNOSIS — R0602 Shortness of breath: Secondary | ICD-10-CM

## 2021-11-30 DIAGNOSIS — J432 Centrilobular emphysema: Secondary | ICD-10-CM | POA: Diagnosis not present

## 2021-11-30 DIAGNOSIS — J449 Chronic obstructive pulmonary disease, unspecified: Secondary | ICD-10-CM

## 2021-11-30 IMAGING — DX DG CHEST 2V
2 series · 2 of 2 positions shown · non-contrast
Comparison: [DATE]

CLINICAL DATA: Shortness of breath.

EXAM:
CHEST - 2 VIEW

[chest pa]
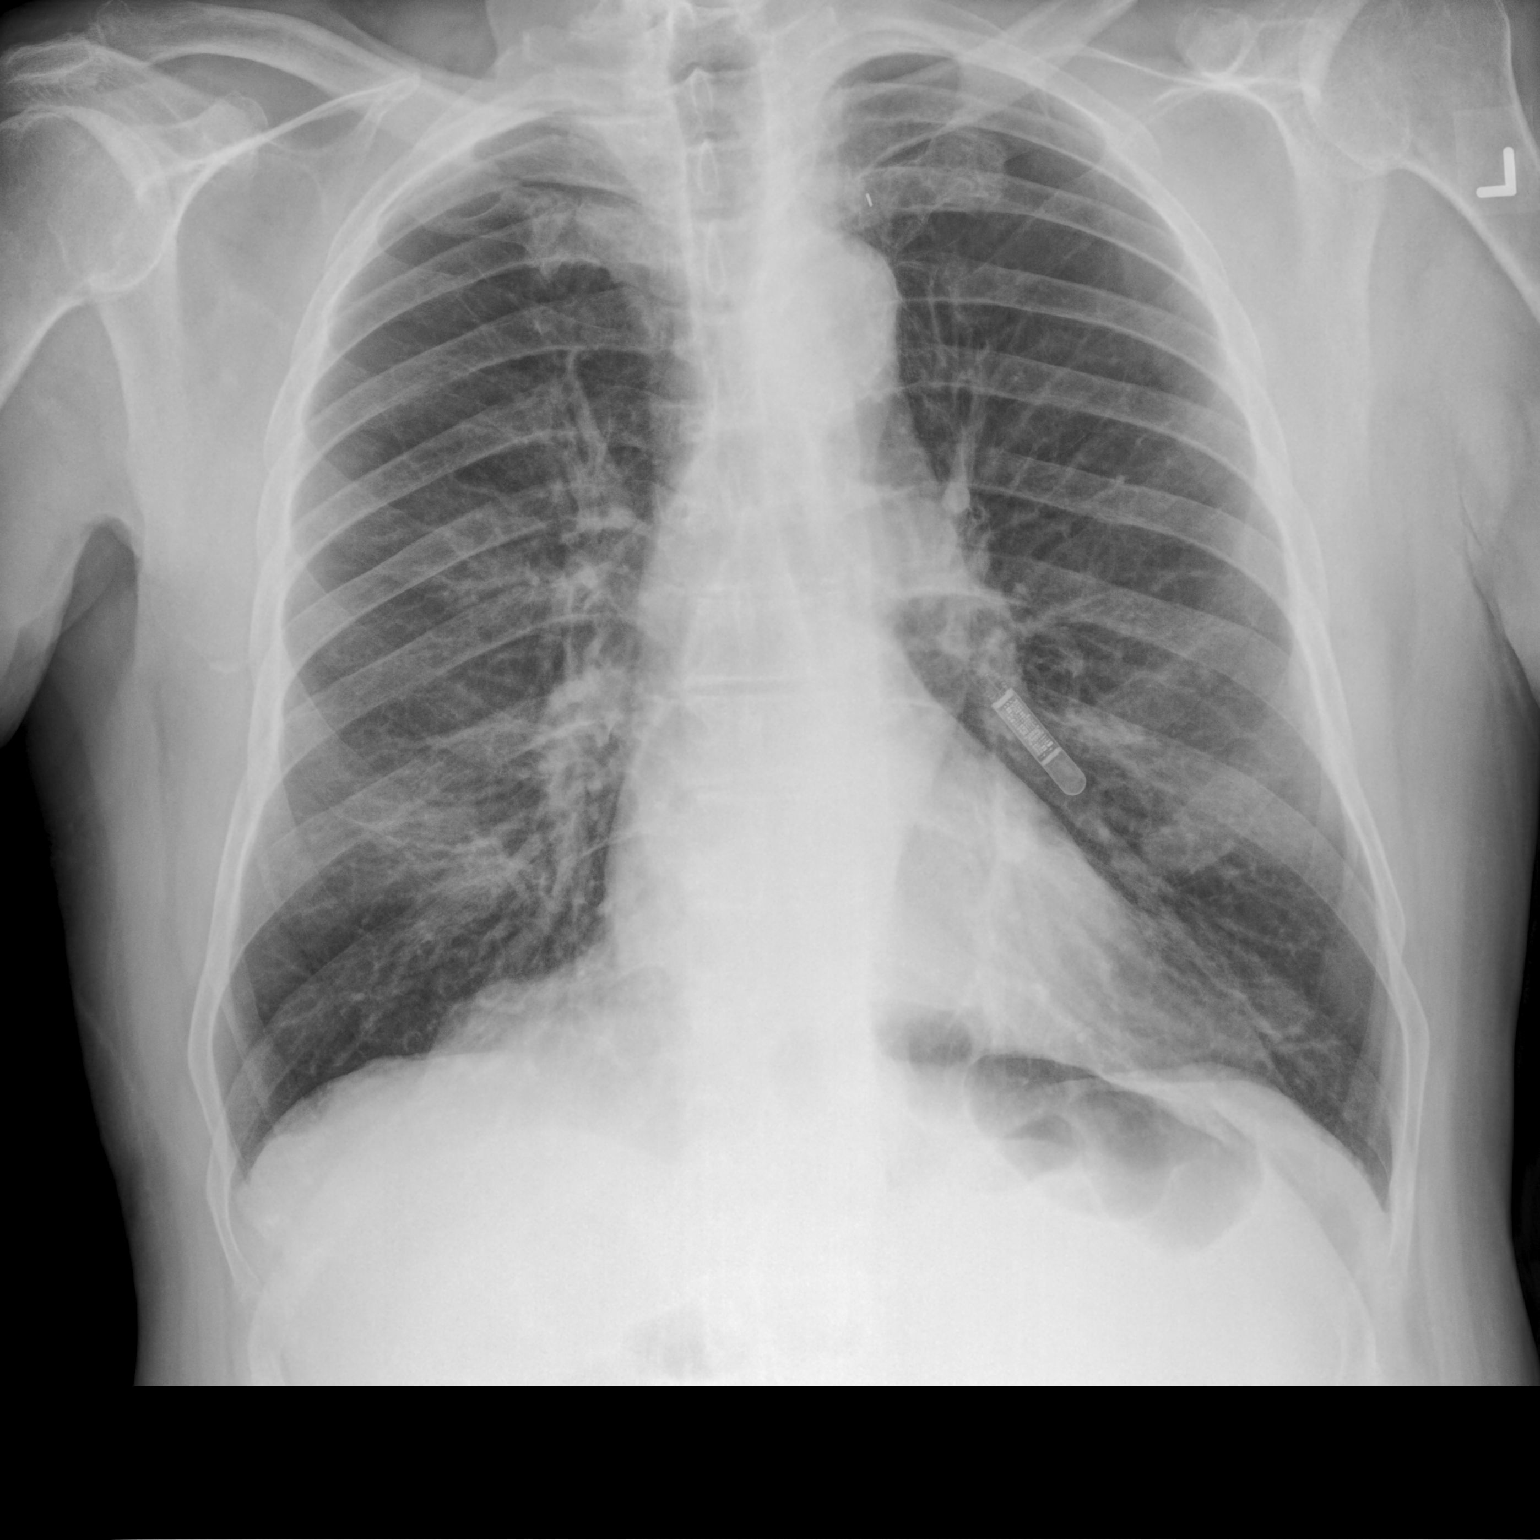

[chest lat]
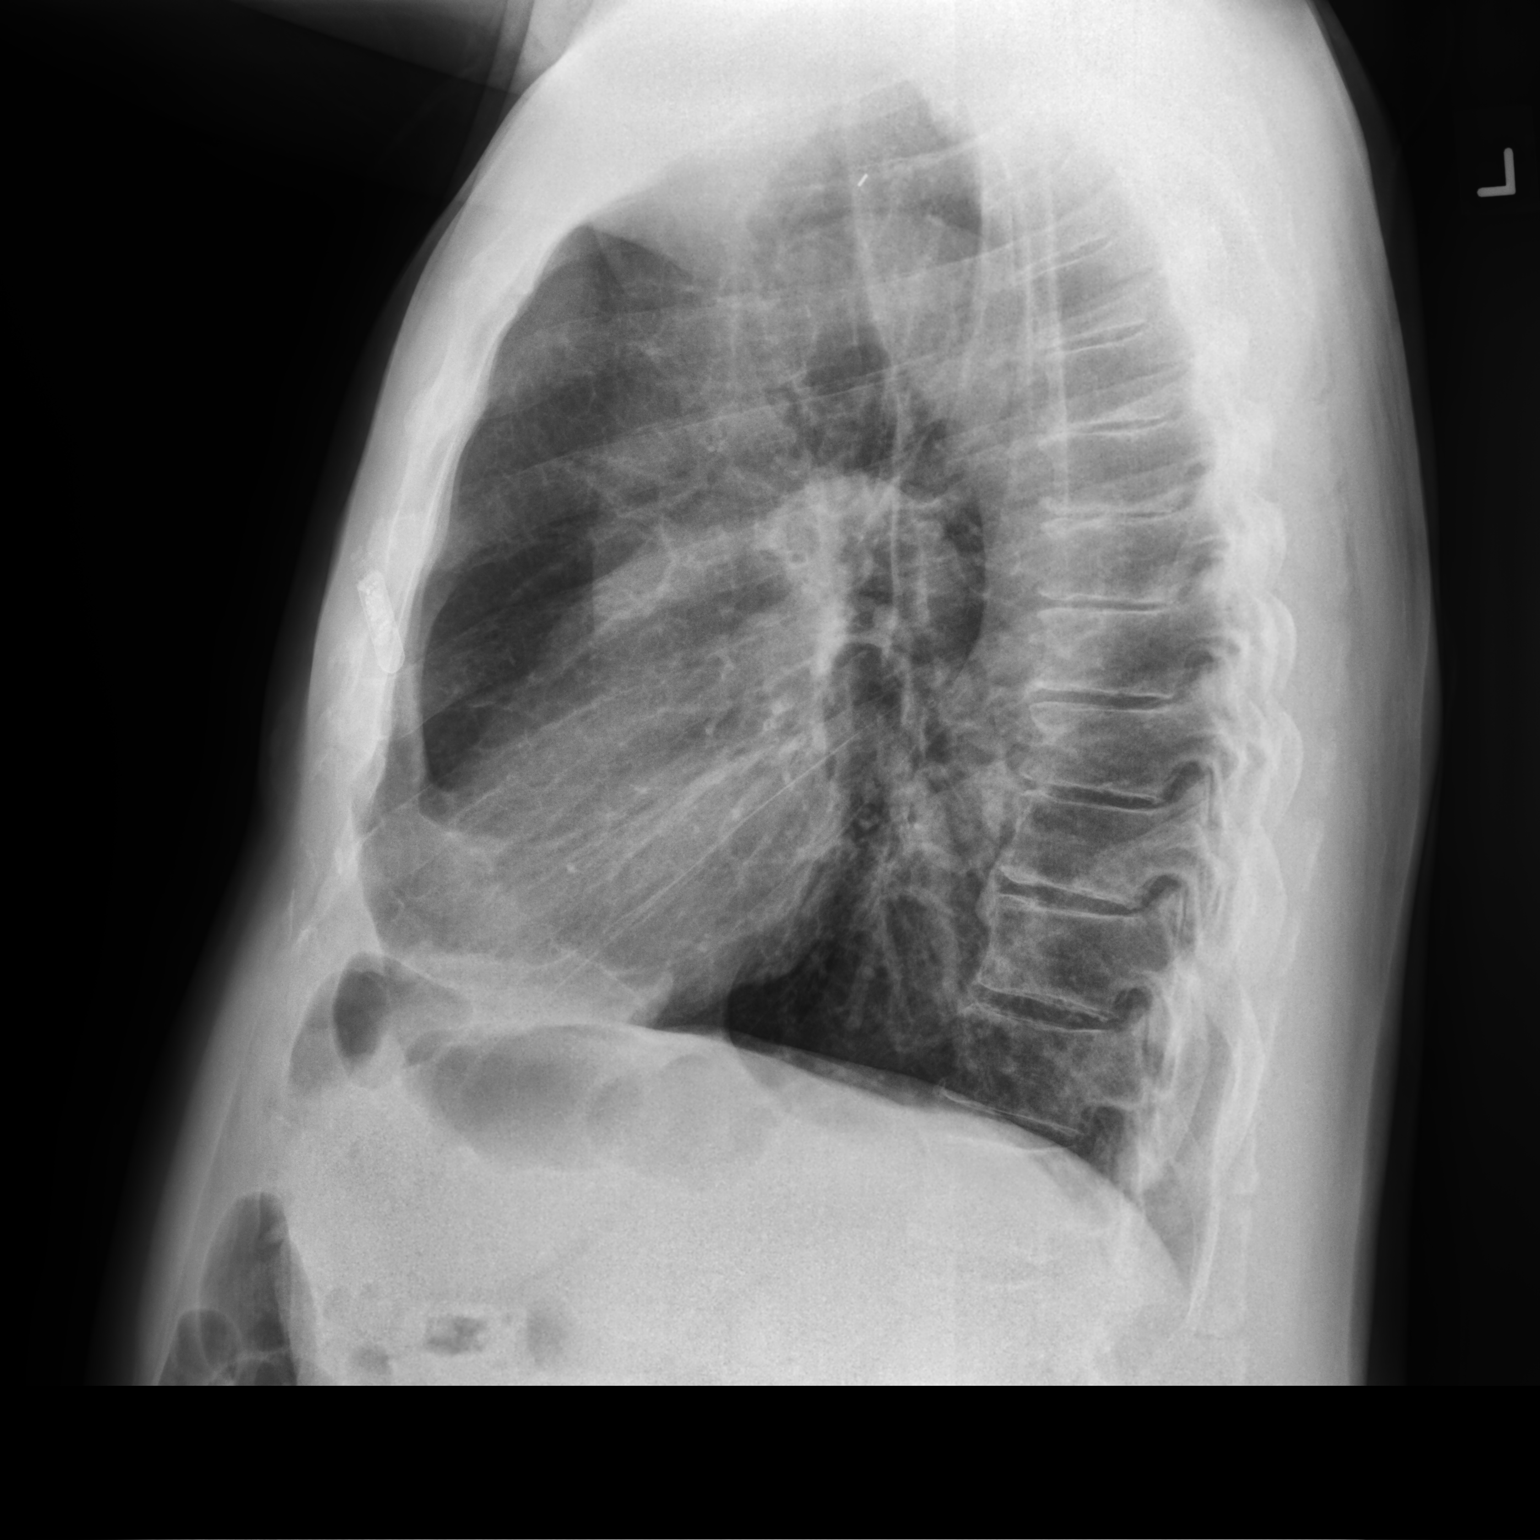

[2 of 2 positions shown; findings below may reference images not displayed]

FINDINGS: Heart size is normal. Stable mild tortuosity of thoracic aorta.
Cardiac loop recorder again seen in the left anterior chest wall.

Pulmonary hyperinflation again seen, consistent with COPD. No
evidence of pulmonary infiltrate or edema. No evidence of pleural
effusion.
IMPRESSION: COPD. No active cardiopulmonary disease.

## 2021-11-30 MED ORDER — PREDNISONE 10 MG PO TABS: ORAL_TABLET | ORAL | 0 refills | Status: DC

## 2021-11-30 MED ORDER — AZITHROMYCIN 250 MG PO TABS: 250.0000 mg | ORAL_TABLET | Freq: Every day | ORAL | 0 refills | Status: DC

## 2021-11-30 NOTE — Telephone Encounter (Signed)
Called and spoke with patient's wife. She is aware of the CXR results.

## 2021-11-30 NOTE — Progress Notes (Signed)
Synopsis: Referred in September 2022 for lung nodule by Wenda Low, MD  Subjective:   PATIENT ID: Joaquin Bend GENDER: male DOB: January 26, 1951, MRN: [id number]  Chief Complaint  Patient presents with   Follow-up    Patient is here to talk about fluid in his lungs    This is a 71 year old gentleman, history of COPD, chronic kidney disease, hypertension, hyperlipidemia, TIA, stroke.  Presents for evaluation of lung nodule. Patient had a nuclear medicine PET scan in March 2022 which revealed a 11 x 7 x 8 mm left upper lobe pleural-based nodule that had an SUV of 2.  Recommended at the time close CT surveillance follow-up.  OV 08/15/2021: Here today for follow-up after surgical consultation.  Dr. Kipp Brood felt that patient would be better served by having robotic assisted bronchoscopy fiducial placement and radiation therapy versus undergoing surgical lobectomy.  After discussing the risk-benefit alternative with the patient and patient's wife.  They are here today to discuss bronchoscopy and neck steps.  We discussed this today in detail risk benefits and alternatives and they were agreeable to proceed.  Unfortunately over the past few weeks patient has had increased use of his albuterol as well as shortness of breath and wheezing.  On exam today is also wheezing.  OV 11/30/2021:Patient completed treatments with SBRT to lesion within the lung.  After bronchoscopy with tissue diagnosis and fiducial placement.  Patient had consultation initially with Dr. Lisbeth Renshaw and radiation oncology on 10/20/2021 followed by subsequent SBRT treatments.  Patient with increasing shortness of breath over the past week.  Nocturnal wheezing.  Difficulty breathing at nighttime.  Using his albuterol every couple of hours.  Also using his albuterol nebulizer.  Still using his Wixela plus Spiriva from the [place].    Past Medical History:  Diagnosis Date   Anxiety    Arthritis    Asthma    Atherosclerosis 01/16/2015    Noted on CXR   Bilateral lower extremity edema 01/16/2015   Chronic kidney disease (CKD), stage II (mild)    COPD (chronic obstructive pulmonary disease) (HCC)    Dementia (HCC)    Depression    Dyspnea    GERD (gastroesophageal reflux disease)    Gout    Hepatitis 12/2019   Hx Hep C positive in blood. Treated.   History of colon polyps    Hoarseness    Hyperlipidemia    Hypertension    Lung cancer ([place]) 09/07/2021   Neck abscess 09/03/2016   Pre-diabetes    Pulmonary emphysema ([place])    Seizure ([place])    Stroke [facility]) 2014   Thyroid disease    TIA (transient ischemic attack)    Urinary frequency    Urine retention    UTI (urinary tract infection)      Family History  Problem Relation Age of Onset   Liver cancer Neg Hx    Liver disease Neg Hx      Past Surgical History:  Procedure Laterality Date   BRONCHIAL BIOPSY  09/07/2021   Procedure: BRONCHIAL BIOPSIES;  Surgeon: Garner Nash, DO;  Location: [facility];  Service: Pulmonary;;   BRONCHIAL BRUSHINGS  09/07/2021   Procedure: BRONCHIAL BRUSHINGS;  Surgeon: Garner Nash, DO;  Location: [facility];  Service: Pulmonary;;   BRONCHIAL NEEDLE ASPIRATION BIOPSY  09/07/2021   Procedure: BRONCHIAL NEEDLE ASPIRATION BIOPSIES;  Surgeon: Garner Nash, DO;  Location: [facility];  Service: Pulmonary;;   COLONOSCOPY  08/29/2018   CYSTOSCOPY WITH BIOPSY N/A 11/20/2018  Procedure: CYSTOSCOPY WITH BLADDER BIOPSY;  Surgeon: Ceasar Mons, MD;  Location: [facility];  Service: Urology;  Laterality: N/A;   FIDUCIAL MARKER PLACEMENT  09/07/2021   Procedure: FIDUCIAL MARKER PLACEMENT;  Surgeon: Garner Nash, DO;  Location: [facility];  Service: Pulmonary;;   HERNIA REPAIR     t monitor     TRANSURETHRAL RESECTION OF PROSTATE N/A 11/20/2018   Procedure: TRANSURETHRAL RESECTION OF THE PROSTATE (TURP);  Surgeon: Ceasar Mons, MD;  Location: [facility];  Service: Urology;   Laterality: N/A;   VIDEO BRONCHOSCOPY WITH ENDOBRONCHIAL NAVIGATION Bilateral 09/07/2021   Procedure: VIDEO BRONCHOSCOPY WITH ENDOBRONCHIAL NAVIGATION;  Surgeon: Garner Nash, DO;  Location: [facility];  Service: Pulmonary;  Laterality: Bilateral;  ION, with fiducial placement    Social History   Socioeconomic History   Marital status: Married    Spouse name: Not on file   Number of children: Not on file   Years of education: Not on file   Highest education level: Not on file  Occupational History   Not on file  Tobacco Use   Smoking status: Former    Types: E-cigarettes    Quit date: 01/2014    Years since quitting: 7.9   Smokeless tobacco: Never  Vaping Use   Vaping Use: Never used  Substance and Sexual Activity   Alcohol use: No   Drug use: No   Sexual activity: Not on file  Other Topics Concern   Not on file  Social History Narrative   Not on file   Social Determinants of Health   Financial Resource Strain: Not on file  Food Insecurity: Not on file  Transportation Needs: Not on file  Physical Activity: Not on file  Stress: Not on file  Social Connections: Not on file  Intimate Partner Violence: Not on file     Allergies  Allergen Reactions   Bee Venom Anaphylaxis    Face gets red and hard to breathe      Outpatient Medications Prior to Visit  Medication Sig Dispense Refill   albuterol (PROVENTIL) (2.5 MG/3ML) 0.083% nebulizer solution Take 3 mLs (2.5 mg total) by nebulization every 6 (six) hours as needed for wheezing or shortness of breath. 75 mL 12   albuterol (VENTOLIN HFA) 108 (90 Base) MCG/ACT inhaler Inhale 1-2 puffs into the lungs every 6 (six) hours as needed for wheezing or shortness of breath. 1 each 0   allopurinol (ZYLOPRIM) 100 MG tablet Take 100 mg by mouth 2 (two) times daily.     atorvastatin (LIPITOR) 10 MG tablet Take 10 mg by mouth at bedtime.     chlorthalidone (HYGROTON) 50 MG tablet Take 50 mg by mouth daily.     FLUoxetine  (PROZAC) 20 MG capsule Take 20 mg by mouth daily.     fluticasone-salmeterol (WIXELA INHUB) 250-50 MCG/ACT AEPB Inhale 1 puff into the lungs in the morning and at bedtime. 60 each 5   Fluticasone-Umeclidin-Vilant (TRELEGY ELLIPTA) 100-62.5-25 MCG/ACT AEPB Inhale 1 puff into the lungs daily. 14 each 0   Fluticasone-Umeclidin-Vilant (TRELEGY ELLIPTA) 100-62.5-25 MCG/ACT AEPB Inhale 1 puff into the lungs daily. 60 each 5   HYDROcodone-acetaminophen (NORCO) 5-325 MG tablet Take 1 tablet by mouth every 4 (four) hours as needed for moderate pain. 15 tablet 0   omeprazole (PRILOSEC) 20 MG capsule Take 20 mg by mouth daily.     tamsulosin (FLOMAX) 0.4 MG CAPS capsule Take 0.4 mg by mouth daily.  Tiotropium Bromide Monohydrate (SPIRIVA RESPIMAT) 2.5 MCG/ACT AERS Inhale 2 puffs into the lungs daily. 12 g 2   aspirin EC 81 MG tablet Take 81 mg by mouth daily. Swallow whole. (Patient not taking: Reported on 09/12/2021)     No facility-administered medications prior to visit.    Review of Systems  Constitutional:  Negative for chills, fever, malaise/fatigue and weight loss.  HENT:  Negative for hearing loss, sore throat and tinnitus.   Eyes:  Negative for blurred vision and double vision.  Respiratory:  Positive for cough, sputum production, shortness of breath and wheezing. Negative for hemoptysis and stridor.   Cardiovascular:  Negative for chest pain, palpitations, orthopnea, leg swelling and PND.  Gastrointestinal:  Negative for abdominal pain, constipation, diarrhea, heartburn, nausea and vomiting.  Genitourinary:  Negative for dysuria, hematuria and urgency.  Musculoskeletal:  Negative for joint pain and myalgias.  Skin:  Negative for itching and rash.  Neurological:  Negative for dizziness, tingling, weakness and headaches.  Endo/Heme/Allergies:  Negative for environmental allergies. Does not bruise/bleed easily.  Psychiatric/Behavioral:  Negative for depression. The patient is not  nervous/anxious and does not have insomnia.   All other systems reviewed and are negative.   Objective:  Physical Exam Vitals reviewed.  Constitutional:      General: He is not in acute distress.    Appearance: He is well-developed.  HENT:     Head: Normocephalic and atraumatic.  Eyes:     General: No scleral icterus.    Conjunctiva/sclera: Conjunctivae normal.     Pupils: Pupils are equal, round, and reactive to light.  Neck:     Vascular: No JVD.     Trachea: No tracheal deviation.  Cardiovascular:     Rate and Rhythm: Normal rate and regular rhythm.     Heart sounds: Normal heart sounds. No murmur heard. Pulmonary:     Effort: Pulmonary effort is normal. No tachypnea, accessory muscle usage or respiratory distress.     Breath sounds: No stridor. Wheezing, rhonchi and rales present.  Abdominal:     General: There is no distension.     Palpations: Abdomen is soft.     Tenderness: There is no abdominal tenderness.  Musculoskeletal:        General: No tenderness.     Cervical back: Neck supple.  Lymphadenopathy:     Cervical: No cervical adenopathy.  Skin:    General: Skin is warm and dry.     Capillary Refill: Capillary refill takes less than 2 seconds.     Findings: No rash.  Neurological:     Mental Status: He is alert and oriented to person, place, and time.  Psychiatric:        Behavior: Behavior normal.     Vitals:   11/30/21 1052  BP: (!) 138/98  Pulse: 82  Temp: 98.9 F (37.2 C)  TempSrc: Oral  SpO2: 98%  Weight: 187 lb (84.8 kg)  Height: 5\' 10"  (1.778 m)   98% on RA BMI Readings from Last 3 Encounters:  11/30/21 26.83 kg/m  10/20/21 28.57 kg/m  09/12/21 28.44 kg/m   Wt Readings from Last 3 Encounters:  11/30/21 187 lb (84.8 kg)  10/20/21 193 lb 8 oz (87.8 kg)  09/12/21 192 lb 9.6 oz (87.4 kg)     CBC    Component Value Date/Time   WBC 7.5 09/07/2021 0738   RBC 3.93 (L) 09/07/2021 0738   HGB 12.7 (L) 09/07/2021 0738   HCT 37.7 (L)  09/07/2021 1607  PLT 231 09/07/2021 0738   MCV 95.9 09/07/2021 0738   MCH 32.3 09/07/2021 0738   MCHC 33.7 09/07/2021 0738   RDW 13.2 09/07/2021 0738   LYMPHSABS 4.0 07/03/2021 0849   MONOABS 0.5 07/03/2021 0849   EOSABS 0.3 07/03/2021 0849   BASOSABS 0.0 07/03/2021 0849    Chest Imaging: June 2022 CT imaging: CT imaging was completed at [facility].  Patient brought disc today to the office. We reviewed imaging today in the office which shows a lobulated left upper lobe medial pleural-based lesion concerning for primary bronchogenic carcinoma. The patient's images have been independently reviewed by me.    Pulmonary Functions Testing Results: PFT Results Latest Ref Rng & Units 07/13/2021  FVC-Pre L 2.09  FVC-Predicted Pre % 56  FVC-Post L 2.62  FVC-Predicted Post % 70  Pre FEV1/FVC % % 43  Post FEV1/FCV % % 50  FEV1-Pre L 0.89  FEV1-Predicted Pre % 31  FEV1-Post L 1.31  DLCO uncorrected ml/min/mmHg 13.99  DLCO UNC% % 55  DLCO corrected ml/min/mmHg 14.75  DLCO COR %Predicted % 58  DLVA Predicted % 79  TLC L 7.44  TLC % Predicted % 109  RV % Predicted % 224    FeNO:   Pathology:   FINAL MICROSCOPIC DIAGNOSIS:   A. LUNG, LUL, FINE NEEDLE ASPIRATION:  - Malignant cells consistent with adenocarcinoma, see comment   Echocardiogram:   Heart Catheterization:     Assessment & Plan:     ICD-10-CM   1. SOB (shortness of breath)  R06.02 DG Chest 2 View    2. COPD with acute exacerbation ([place])  J44.1 DG Chest 2 View    3. Chronic obstructive pulmonary disease, unspecified COPD type ([place])  J44.9     4. Centrilobular emphysema ([place])  J43.2     5. Stage I adenocarcinoma of lung, left ([place])  C34.92       Discussion:  This is a 71 year old gentleman, former smoker in 2015 quit, COPD baseline.  Felt not to be a surgical candidate decision was made for robotic assisted bronchoscopy with tissue sampling and fiducial placement with referral to radiation oncology  for SBRT for stage I adenocarcinoma of the lung left upper lobe.  Patient undergoing a COPD exacerbation at the moment to increase shortness of breath cough sputum production and wheezing for the past couple weeks.  Plan: Prednisone taper started Continue triple therapy inhaler regimen Start azithromycin short course of antibiotics. Chest x-ray two-view today in the office. Bedside ultrasound today in the office with no evidence of effusion with a quick look today. Return to clinic in 4 weeks to make sure he is getting better.    Current Outpatient Medications:    albuterol (PROVENTIL) (2.5 MG/3ML) 0.083% nebulizer solution, Take 3 mLs (2.5 mg total) by nebulization every 6 (six) hours as needed for wheezing or shortness of breath., Disp: 75 mL, Rfl: 12   albuterol (VENTOLIN HFA) 108 (90 Base) MCG/ACT inhaler, Inhale 1-2 puffs into the lungs every 6 (six) hours as needed for wheezing or shortness of breath., Disp: 1 each, Rfl: 0   allopurinol (ZYLOPRIM) 100 MG tablet, Take 100 mg by mouth 2 (two) times daily., Disp: , Rfl:    atorvastatin (LIPITOR) 10 MG tablet, Take 10 mg by mouth at bedtime., Disp: , Rfl:    azithromycin (ZITHROMAX) 250 MG tablet, Take 1 tablet (250 mg total) by mouth daily., Disp: 6 tablet, Rfl: 0   chlorthalidone (HYGROTON) 50 MG tablet, Take 50 mg  by mouth daily., Disp: , Rfl:    FLUoxetine (PROZAC) 20 MG capsule, Take 20 mg by mouth daily., Disp: , Rfl:    fluticasone-salmeterol (WIXELA INHUB) 250-50 MCG/ACT AEPB, Inhale 1 puff into the lungs in the morning and at bedtime., Disp: 60 each, Rfl: 5   Fluticasone-Umeclidin-Vilant (TRELEGY ELLIPTA) 100-62.5-25 MCG/ACT AEPB, Inhale 1 puff into the lungs daily., Disp: 14 each, Rfl: 0   Fluticasone-Umeclidin-Vilant (TRELEGY ELLIPTA) 100-62.5-25 MCG/ACT AEPB, Inhale 1 puff into the lungs daily., Disp: 60 each, Rfl: 5   HYDROcodone-acetaminophen (NORCO) 5-325 MG tablet, Take 1 tablet by mouth every 4 (four) hours as needed for  moderate pain., Disp: 15 tablet, Rfl: 0   omeprazole (PRILOSEC) 20 MG capsule, Take 20 mg by mouth daily., Disp: , Rfl:    predniSONE (DELTASONE) 10 MG tablet, Take 4 tabs by mouth once daily x4 days, then 3 tabs x4 days, 2 tabs x4 days, 1 tab x4 days and stop., Disp: 40 tablet, Rfl: 0   tamsulosin (FLOMAX) 0.4 MG CAPS capsule, Take 0.4 mg by mouth daily., Disp: , Rfl:    Tiotropium Bromide Monohydrate (SPIRIVA RESPIMAT) 2.5 MCG/ACT AERS, Inhale 2 puffs into the lungs daily., Disp: 12 g, Rfl: 2   aspirin EC 81 MG tablet, Take 81 mg by mouth daily. Swallow whole. (Patient not taking: Reported on 09/12/2021), Disp: , Rfl:     Garner Nash, DO [facility] 11/30/2021 11:11 AM

## 2021-11-30 NOTE — Progress Notes (Signed)
Can let patient know his CXR is clear, no pneumonia   Thanks,  BLI  Garner Nash, DO [facility] 11/30/2021 2:53 PM

## 2021-11-30 NOTE — Telephone Encounter (Signed)
-----   Message from Garner Nash, DO sent at 11/30/2021  2:54 PM EST ----- Can let patient know his CXR is clear, no pneumonia   Thanks,  BLI  Garner Nash, DO [facility] 11/30/2021 2:53 PM

## 2021-11-30 NOTE — Patient Instructions (Addendum)
Thank you for visiting Dr. Valeta Harms at [facility]. Today we recommend the following:  Orders Placed This Encounter  Procedures   DG Chest 2 View   Meds ordered this encounter  Medications   predniSONE (DELTASONE) 10 MG tablet    Sig: Take 4 tabs by mouth once daily x4 days, then 3 tabs x4 days, 2 tabs x4 days, 1 tab x4 days and stop.    Dispense:  40 tablet    Refill:  0   azithromycin (ZITHROMAX) 250 MG tablet    Sig: Take 1 tablet (250 mg total) by mouth daily.    Dispense:  6 tablet    Refill:  0   Return in about 4 weeks (around 12/28/2021) for with Eric Form, NP, or Dr. Valeta Harms.    Please do your part to reduce the spread of COVID-19.

## 2021-11-30 NOTE — Telephone Encounter (Signed)
-----   Message from Garner Nash, DO sent at 11/30/2021  2:54 PM EST ----- Can let patient know his CXR is clear, no pneumonia   Thanks,  BLI  Garner Nash, DO [facility] 11/30/2021 2:53 PM

## 2021-12-01 ENCOUNTER — Encounter (HOSPITAL_COMMUNITY): Payer: Self-pay

## 2021-12-01 ENCOUNTER — Other Ambulatory Visit: Payer: Self-pay

## 2021-12-01 ENCOUNTER — Emergency Department (HOSPITAL_COMMUNITY)
Admission: EM | Admit: 2021-12-01 | Discharge: 2021-12-02 | Disposition: A | Discharge status: Home / Self Care | Payer: No Typology Code available for payment source | Attending: Emergency Medicine | Admitting: Emergency Medicine

## 2021-12-01 ENCOUNTER — Emergency Department (HOSPITAL_COMMUNITY): Payer: No Typology Code available for payment source

## 2021-12-01 DIAGNOSIS — N182 Chronic kidney disease, stage 2 (mild): Secondary | ICD-10-CM | POA: Insufficient documentation

## 2021-12-01 DIAGNOSIS — Z20822 Contact with and (suspected) exposure to covid-19: Secondary | ICD-10-CM | POA: Insufficient documentation

## 2021-12-01 DIAGNOSIS — J449 Chronic obstructive pulmonary disease, unspecified: Secondary | ICD-10-CM | POA: Insufficient documentation

## 2021-12-01 DIAGNOSIS — R112 Nausea with vomiting, unspecified: Secondary | ICD-10-CM | POA: Insufficient documentation

## 2021-12-01 DIAGNOSIS — Z85118 Personal history of other malignant neoplasm of bronchus and lung: Secondary | ICD-10-CM | POA: Insufficient documentation

## 2021-12-01 LAB — CBC WITH DIFFERENTIAL/PLATELET
Abs Immature Granulocytes: 0.01 10*3/uL (ref 0.00–0.07)
Basophils Absolute: 0 10*3/uL (ref 0.0–0.1)
Basophils Relative: 0 %
Eosinophils Absolute: 0 10*3/uL (ref 0.0–0.5)
Eosinophils Relative: 0 %
HCT: 39.6 % (ref 39.0–52.0)
Hemoglobin: 13.6 g/dL (ref 13.0–17.0)
Immature Granulocytes: 0 %
Lymphocytes Relative: 16 %
Lymphs Abs: 0.6 10*3/uL — ABNORMAL LOW (ref 0.7–4.0)
MCH: 31.6 pg (ref 26.0–34.0)
MCHC: 34.3 g/dL (ref 30.0–36.0)
MCV: 91.9 fL (ref 80.0–100.0)
Monocytes Absolute: 0.1 10*3/uL (ref 0.1–1.0)
Monocytes Relative: 3 %
Neutro Abs: 3 10*3/uL (ref 1.7–7.7)
Neutrophils Relative %: 81 %
Platelets: 255 10*3/uL (ref 150–400)
RBC: 4.31 MIL/uL (ref 4.22–5.81)
RDW: 12 % (ref 11.5–15.5)
WBC: 3.7 10*3/uL — ABNORMAL LOW (ref 4.0–10.5)
nRBC: 0 % (ref 0.0–0.2)

## 2021-12-01 LAB — COMPREHENSIVE METABOLIC PANEL
ALT: 9 U/L (ref 0–44)
AST: 13 U/L — ABNORMAL LOW (ref 15–41)
Albumin: 4.4 g/dL (ref 3.5–5.0)
Alkaline Phosphatase: 59 U/L (ref 38–126)
Anion gap: 10 (ref 5–15)
BUN: 14 mg/dL (ref 8–23)
CO2: 23 mmol/L (ref 22–32)
Calcium: 9.5 mg/dL (ref 8.9–10.3)
Chloride: 103 mmol/L (ref 98–111)
Creatinine, Ser: 1.01 mg/dL (ref 0.61–1.24)
GFR, Estimated: >60 mL/min (ref >60)
Glucose, Bld: 200 mg/dL — ABNORMAL HIGH (ref 70–99)
Potassium: 4.5 mmol/L (ref 3.5–5.1)
Sodium: 136 mmol/L (ref 135–145)
Total Bilirubin: 0.9 mg/dL (ref 0.3–1.2)
Total Protein: 8.1 g/dL (ref 6.5–8.1)

## 2021-12-01 LAB — RESP PANEL BY RT-PCR (FLU A&B, COVID) ARPGX2
Influenza A by PCR: NEGATIVE
Influenza B by PCR: NEGATIVE
SARS Coronavirus 2 by RT PCR: NEGATIVE

## 2021-12-01 LAB — LIPASE, BLOOD: Lipase: 24 U/L (ref 11–51)

## 2021-12-01 MED ORDER — IOHEXOL 300 MG/ML  SOLN
100.0000 mL | Freq: Once | INTRAMUSCULAR | Status: AC | PRN
  Administered 2021-12-01: 100 mL via INTRAVENOUS

## 2021-12-01 MED ORDER — LACTATED RINGERS IV BOLUS
1000.0000 mL | Freq: Once | INTRAVENOUS | Status: AC
  Administered 2021-12-01: 1000 mL via INTRAVENOUS

## 2021-12-01 MED ORDER — ONDANSETRON HCL 4 MG/2ML IJ SOLN
4.0000 mg | Freq: Once | INTRAMUSCULAR | Status: AC
  Administered 2021-12-01: 4 mg via INTRAVENOUS
  Filled 2021-12-01: qty 2

## 2021-12-01 NOTE — ED Provider Notes (Signed)
[facility] DEPT Provider Note   CSN: [id number] Arrival date & time: 12/01/21  2211     History  Chief Complaint  Patient presents with   Emesis    Bruce Sullivan is a 71 y.o. male.  The history is provided by the patient and the spouse.  Emesis  42 y.o. M with hx of COPD, GAD, hep C, hx of prior stroke, newly diagnosed lung cancer s/p radiation, CKD II, presenting to the ED with vomiting.  Wife reports since around 5 PM he has vomited at least a dozen times and now his abdomen appears distended.  He has been going to the bathroom frequently today as well.  He states he has had regular bowel movements.  He has not had any fevers.  He did just complete radiation treatment for his new lung cancer, currently pending bronchoscopy with tissue sampling.  Wife also recently hospitalized herself for COVID-19, patient has not had any cough or upper respiratory symptoms.  He did have CXR yesterday at pulmonology office that was negative.  Home Medications Prior to Admission medications   Medication Sig Start Date End Date Taking? Authorizing Provider  albuterol (PROVENTIL) (2.5 MG/3ML) 0.083% nebulizer solution Take 3 mLs (2.5 mg total) by nebulization every 6 (six) hours as needed for wheezing or shortness of breath. 07/27/21   Icard, Leory Plowman L, DO  albuterol (VENTOLIN HFA) 108 (90 Base) MCG/ACT inhaler Inhale 1-2 puffs into the lungs every 6 (six) hours as needed for wheezing or shortness of breath. 12/27/20   Luna Fuse, MD  allopurinol (ZYLOPRIM) 100 MG tablet Take 100 mg by mouth 2 (two) times daily. 08/02/18   [provider]  aspirin EC 81 MG tablet Take 81 mg by mouth daily. Swallow whole. Patient not taking: Reported on 09/12/2021    [provider]  atorvastatin (LIPITOR) 10 MG tablet Take 10 mg by mouth at bedtime.    [provider]  azithromycin (ZITHROMAX) 250 MG tablet Take 1 tablet (250 mg total) by mouth daily. 11/30/21    Icard, Octavio Graves, DO  chlorthalidone (HYGROTON) 50 MG tablet Take 50 mg by mouth daily.    [provider]  FLUoxetine (PROZAC) 20 MG capsule Take 20 mg by mouth daily. 08/02/18   [provider]  fluticasone-salmeterol (WIXELA INHUB) 250-50 MCG/ACT AEPB Inhale 1 puff into the lungs in the morning and at bedtime. 09/14/21   Martyn Ehrich, NP  Fluticasone-Umeclidin-Vilant (TRELEGY ELLIPTA) 100-62.5-25 MCG/ACT AEPB Inhale 1 puff into the lungs daily. 09/07/21   Icard, Octavio Graves, DO  Fluticasone-Umeclidin-Vilant (TRELEGY ELLIPTA) 100-62.5-25 MCG/ACT AEPB Inhale 1 puff into the lungs daily. 09/12/21   Martyn Ehrich, NP  HYDROcodone-acetaminophen (NORCO) 5-325 MG tablet Take 1 tablet by mouth every 4 (four) hours as needed for moderate pain. 11/20/18   Ceasar Mons, MD  omeprazole (PRILOSEC) 20 MG capsule Take 20 mg by mouth daily.    [provider]  predniSONE (DELTASONE) 10 MG tablet Take 4 tabs by mouth once daily x4 days, then 3 tabs x4 days, 2 tabs x4 days, 1 tab x4 days and stop. 11/30/21   Garner Nash, DO  tamsulosin (FLOMAX) 0.4 MG CAPS capsule Take 0.4 mg by mouth daily.    [provider]  Tiotropium Bromide Monohydrate (SPIRIVA RESPIMAT) 2.5 MCG/ACT AERS Inhale 2 puffs into the lungs daily. 09/14/21 12/13/21  Martyn Ehrich, NP      Allergies    Bee venom  Review of Systems   Review of Systems  Gastrointestinal:  Positive for vomiting.  All other systems reviewed and are negative.  Physical Exam Updated Vital Signs BP (!) 165/95    Pulse 77    Temp 97.8 F (36.6 C) (Oral)    Resp (!) 23    Ht 5\' 10"  (1.778 m)    Wt 84.8 kg    SpO2 98%    BMI 26.82 kg/m   Physical Exam Vitals and nursing note reviewed.  Constitutional:      Appearance: He is well-developed.  HENT:     Head: Normocephalic and atraumatic.  Eyes:     Conjunctiva/sclera: Conjunctivae normal.     Pupils: Pupils are equal, round, and reactive to light.   Cardiovascular:     Rate and Rhythm: Normal rate and regular rhythm.     Heart sounds: Normal heart sounds.  Pulmonary:     Effort: Pulmonary effort is normal.     Breath sounds: Normal breath sounds.  Abdominal:     General: Bowel sounds are normal. There is distension.     Palpations: Abdomen is soft.     Comments: Seems mildly distended but soft, no elicited tenderness with palpation  Musculoskeletal:        General: Normal range of motion.     Cervical back: Normal range of motion.  Skin:    General: Skin is warm and dry.  Neurological:     Mental Status: He is alert and oriented to person, place, and time.    ED Results / Procedures / Treatments   Labs (all labs ordered are listed, but only abnormal results are displayed) Labs Reviewed  CBC WITH DIFFERENTIAL/PLATELET - Abnormal; Notable for the following components:      Result Value   WBC 3.7 (*)    Lymphs Abs 0.6 (*)    All other components within normal limits  COMPREHENSIVE METABOLIC PANEL - Abnormal; Notable for the following components:   Glucose, Bld 200 (*)    AST 13 (*)    All other components within normal limits  RESP PANEL BY RT-PCR (FLU A&B, COVID) [id number]  URINALYSIS, ROUTINE W REFLEX MICROSCOPIC  LIPASE, BLOOD    EKG None  Radiology DG Chest 2 View  Result Date: 11/30/2021 CLINICAL DATA:  Shortness of breath. EXAM: CHEST - 2 VIEW COMPARISON:  09/07/2021 FINDINGS: Heart size is normal. Stable mild tortuosity of thoracic aorta. Cardiac loop recorder again seen in the left anterior chest wall. Pulmonary hyperinflation again seen, consistent with COPD. No evidence of pulmonary infiltrate or edema. No evidence of pleural effusion. IMPRESSION: COPD. No active cardiopulmonary disease. Electronically Signed   By: Marlaine Hind M.D.   On: 11/30/2021 11:41    Procedures Procedures    Medications Ordered in ED Medications  ondansetron (ZOFRAN) injection 4 mg (has no administration in time range)  lactated  ringers bolus 1,000 mL (has no administration in time range)    ED Course/ Medical Decision Making/ A&P                           Medical Decision Making Amount and/or Complexity of Data Reviewed Independent Historian: spouse External Data Reviewed: labs and radiology. Labs: ordered. Radiology: ordered and independent interpretation performed. ECG/medicine tests: ordered and independent interpretation performed.  Risk Prescription drug management.   71 y.o. M presenting to the ED with vomiting since around 5PM today, wife reports about 12 episodes.  Had normal  BM today.  He is afebrile, non-toxic.  Abdomen appears mildly distended, has hx of same chronically but wife states looks larger than normal.  There is no elicited tenderness, abdomen is soft.  Labs are overall reassuring.  Will get CT scan to assess for acute pathology/obstruction.  Given IVF, zofran.  12:02 AM Began vomiting again, small amount.  Given dose of reglan.  Will try and PO challenge afterwards.  CT reviewed with family, no acute findings.  UA pending.  1:55 AM UA without signs of infection.  No further vomiting after reglan.  Able to tolerate PO BP medications and coke afterwards.  VSS.  Appropriate for discharge home.  Can follow-up with PCP.  Return here for new concerns.  Final Clinical Impression(s) / ED Diagnoses Final diagnoses:  Nausea and vomiting, unspecified vomiting type    Rx / DC Orders ED Discharge Orders          Ordered    ondansetron (ZOFRAN-ODT) 4 MG disintegrating tablet  Every 8 hours PRN        12/02/21 0155              Larene Pickett, PA-C 12/02/21 0249    Veryl Speak, MD 12/02/21 [phone]

## 2021-12-01 NOTE — ED Triage Notes (Signed)
Pt reports with 11 episodes of vomiting since 5 pm today. The wife states that he just finished his radiation treatment on Jan 17 for his lung cancer. Wife also states that she just got out of the hospital for Covid.

## 2021-12-01 NOTE — ED Notes (Signed)
Patient transported to CT 

## 2021-12-02 LAB — URINALYSIS, ROUTINE W REFLEX MICROSCOPIC
Bilirubin Urine: NEGATIVE
Glucose, UA: 50 mg/dL — AB
Ketones, ur: 5 mg/dL — AB
Leukocytes,Ua: NEGATIVE
Nitrite: NEGATIVE
Protein, ur: 30 mg/dL — AB
Specific Gravity, Urine: >1.046 — ABNORMAL HIGH (ref 1.005–1.030)
pH: 6 (ref 5.0–8.0)

## 2021-12-02 MED ORDER — ONDANSETRON 4 MG PO TBDP: 4.0000 mg | ORAL_TABLET | Freq: Three times a day (TID) | ORAL | 0 refills | Status: DC | PRN

## 2021-12-02 MED ORDER — CHLORTHALIDONE 50 MG PO TABS
50.0000 mg | ORAL_TABLET | Freq: Once | ORAL | Status: AC
Start: 2021-12-02 — End: 2021-12-02
  Administered 2021-12-02: 50 mg via ORAL
  Filled 2021-12-02: qty 1

## 2021-12-02 MED ORDER — IPRATROPIUM-ALBUTEROL 0.5-2.5 (3) MG/3ML IN SOLN
3.0000 mL | Freq: Once | RESPIRATORY_TRACT | Status: AC
  Administered 2021-12-02: 3 mL via RESPIRATORY_TRACT
  Filled 2021-12-02: qty 3

## 2021-12-02 MED ORDER — METOCLOPRAMIDE HCL 5 MG/ML IJ SOLN
10.0000 mg | INTRAMUSCULAR | Status: AC
  Administered 2021-12-02: 10 mg via INTRAVENOUS
  Filled 2021-12-02: qty 2

## 2021-12-02 NOTE — Discharge Instructions (Addendum)
Labs and CT scan today were normal.  Covid and flu testing was negative. Use the zofran as needed for nausea/vomiting. Please follow-up with your primary care doctor. Return here for new concerns.

## 2021-12-19 ENCOUNTER — Ambulatory Visit
Admission: RE | Admit: 2021-12-19 | Discharge: 2021-12-19 | Disposition: A | Discharge status: Home / Self Care | Payer: Medicare Other | Source: Ambulatory Visit | Attending: Radiation Oncology | Admitting: Radiation Oncology

## 2021-12-19 DIAGNOSIS — C3412 Malignant neoplasm of upper lobe, left bronchus or lung: Secondary | ICD-10-CM

## 2021-12-19 NOTE — Progress Notes (Signed)
°  Radiation Oncology         [phone] ________________________________  Name: Bruce Sullivan MRN: [id number]  Date of Service: 12/19/2021  DOB: 07/17/1951  Post Treatment Telephone Note  Diagnosis:   Stage IA2, cT1bN0M0, NSCLC, adenocarcinoma of the LUL  Intent: Curative  Radiation Treatment Dates:  11/09/2021 through 11/22/2021 Ultrahypofractionated Radiotherapy Site Technique Total Dose (Gy) Dose per Fx (Gy) Completed Fx Beam Energies  Lung, Left:  LUL  IMRT 65/65 6.5 10/10 6XFFF   Narrative: The patient tolerated radiation therapy relatively well.  He will have a CT scan in the next month.   Impression/Plan: 1. Stage IA2, cT1bN0M0, NSCLC, adenocarcinoma of the LUL. I was unable to reach the patient but left a voicemail and on the message, I  discussed that we will follow up with his upcoming CT scan and every 6 months thereafter per NCCN Guidelines.     Carola Rhine, PAC

## 2021-12-22 NOTE — Addendum Note (Signed)
Encounter addended by: Hayden Pedro, PA-C on: 12/22/2021 5:09 PM  Actions taken: Clinical Note Signed

## 2021-12-22 NOTE — Progress Notes (Signed)
The pt's wife called back and let me know that she received the message. He has been struggling with his breathing since his last visit with pulmonary and also had an ed visit for n/v which has resolved. He is out of his medications and the pharmacy says he insurance won't fil them any sooner. He has started using a nebulizer every 3 hours. He has an appt with pulmonary tomorrow but has not reached out to their office. I'll send a message to Dr. Valeta Harms and Eric Form, NP and asked the paitent's wife to call today before the office closes in case they have further direction for the day. They know the ED is an option if he has acute issues with breathing overnight.

## 2021-12-23 ENCOUNTER — Encounter: Payer: Self-pay | Admitting: Acute Care

## 2021-12-23 ENCOUNTER — Other Ambulatory Visit: Payer: Self-pay

## 2021-12-23 ENCOUNTER — Ambulatory Visit (INDEPENDENT_AMBULATORY_CARE_PROVIDER_SITE_OTHER): Payer: No Typology Code available for payment source

## 2021-12-23 ENCOUNTER — Ambulatory Visit (INDEPENDENT_AMBULATORY_CARE_PROVIDER_SITE_OTHER): Payer: No Typology Code available for payment source | Admitting: Acute Care

## 2021-12-23 VITALS — BP 110/70 | HR 85 | Temp 98.2°F | Ht 69.0 in | Wt 186.4 lb

## 2021-12-23 DIAGNOSIS — R0602 Shortness of breath: Secondary | ICD-10-CM

## 2021-12-23 DIAGNOSIS — C3490 Malignant neoplasm of unspecified part of unspecified bronchus or lung: Secondary | ICD-10-CM

## 2021-12-23 LAB — CBC WITH DIFFERENTIAL/PLATELET
Basophils Absolute: 0 10*3/uL (ref 0.0–0.1)
Basophils Relative: 0.3 % (ref 0.0–3.0)
Eosinophils Absolute: 0.1 10*3/uL (ref 0.0–0.7)
Eosinophils Relative: 2.1 % (ref 0.0–5.0)
HCT: 38.3 % — ABNORMAL LOW (ref 39.0–52.0)
Hemoglobin: 12.8 g/dL — ABNORMAL LOW (ref 13.0–17.0)
Lymphocytes Relative: 41.5 % (ref 12.0–46.0)
Lymphs Abs: 2.4 10*3/uL (ref 0.7–4.0)
MCHC: 33.3 g/dL (ref 30.0–36.0)
MCV: 92.8 fl (ref 78.0–100.0)
Monocytes Absolute: 0.5 10*3/uL (ref 0.1–1.0)
Monocytes Relative: 8.6 % (ref 3.0–12.0)
Neutro Abs: 2.8 10*3/uL (ref 1.4–7.7)
Neutrophils Relative %: 47.5 % (ref 43.0–77.0)
Platelets: 244 10*3/uL (ref 150.0–400.0)
RBC: 4.12 Mil/uL — ABNORMAL LOW (ref 4.22–5.81)
RDW: 13.1 % (ref 11.5–15.5)
WBC: 5.8 10*3/uL (ref 4.0–10.5)

## 2021-12-23 MED ORDER — BREZTRI AEROSPHERE 160-9-4.8 MCG/ACT IN AERO: 2.0000 | INHALATION_SPRAY | Freq: Two times a day (BID) | RESPIRATORY_TRACT | 0 refills | Status: DC

## 2021-12-23 NOTE — Patient Instructions (Addendum)
It is good to see you today. We will do a CBC today to make sure you do not have anemia. We will do a CXR today >> stat. We will call you with the results. I will talk with Dr. Valeta Harms about getting an MRI brain.  We will send you home with [facility] Once you get the [facility] use 2 puffs in the morning and 2 puffs in the evening every day without fail Rinse mouth after use Albuterol nebs for breakthrough shortness of breath or wheezing.  Follow up in 1 month with Judson Roch NP or Dr. Valeta Harms.  Please contact office for sooner follow up if symptoms do not improve or worsen or seek emergency care

## 2021-12-23 NOTE — Progress Notes (Signed)
History of Present Illness Bruce Sullivan is a 71 y.o. male former smoker ( Quit 2015) with  history of COPD, chronic kidney disease, hypertension, hyperlipidemia, TIA, stroke.., and Stage IA2, cT1bN0M0, NSCLC, adenocarcinoma of the LUL, treated  11/09/2021 through 11/22/2021 Ultrahypofractionated Radiotherapy. Surveillance CT Chest due 01/2022. He is followed by Dr. Valeta Harms.  Maintenance  Wixela plus Spiriva from the [place] Albuterol as rescue  12/23/2021 Pt. Presents for follow up. He was seen by Dr. Valeta Harms 11/30/2021. He has had increasing shortness of breath.He was treated for a COPD flare.  He was treated with  a prednisone taper and azithromycin at the time.He did complete the prednisone taper and the azithromycin.  His wife called to make this appointment as she does not feel he is better,   Per patients wife, the patient is not doing well. He is over using his albuterol inhaler and he does not have his Wixela or his Spiriva. He gets these filled through the [place] and it will be 10 days before they can be filled again. Marland Kitchen He has not had his maintenance medication for at least a week. He is out of his rescue inhaler, but does have nebs at home he is using.  He has been incontinent of stool and he has been stumbling more at home.He is coughing and he has a hoarse voice.  He stumbled today when he was walking to the exam room. His wife is going through the [place] to get help at home. She states he has been staring off into space and standing and staring which she says is new for him. He is slower to respond than his baseline.  We will check a CBC for anemia/ CXR and consider MRI brain.  He has completed his radiation therapy. He is followed by oncology and will have follow up CT Chest on March or April.   Both patient and his wife are very confused about when to use Maintenance inhaler and when to use rescue. He had only been using his maintenance  when he was short of breath, and per his wife he used his new  albuterol inhaler completely in about 4 days. . We did extensive education today.  We will try Breztri, as he cannot get his Wixela and Spiriva for 10 days, and we have Breztri samples to offer.  Test Results:  PET scan 07/26/2021 Enlarging hypermetabolic pulmonary nodule in the medial LEFT upper lobe is most concerning for bronchogenic carcinoma. No evidence of metastatic mediastinal lymphadenopathy or distant metastatic disease  09/07/2021 Robotic Assisted Bronchoscopy CYTOLOGY - NON PAP  CASE: [id number]  PATIENT: Bruce Sullivan   FINAL MICROSCOPIC DIAGNOSIS:   A. LUNG, LUL, FINE NEEDLE ASPIRATION:  - Malignant cells consistent with adenocarcinoma,   B. LUNG, LUL, BRUSH:  - No malignant cells identified   Radiation Therapy  Diagnosis:   Stage IA2, cT1bN0M0, NSCLC, adenocarcinoma of the LUL  Intent: Curative  Radiation Treatment Dates:  11/09/2021 through 11/22/2021 Ultrahypofractionated Radiotherapy Site Technique Total Dose (Gy) Dose per Fx (Gy) Completed Fx Beam Energies  Lung, Left:  LUL  IMRT 65/65 6.5 10/10 6XFFF       Impression/Plan: 1.         Stage IA2, cT1bN0M0, NSCLC, adenocarcinoma of the LUL.  follow up  upcoming CT scan in March  and every 6 months thereafter per NCCN Guidelines.     CBC Latest Ref Rng & Units 12/01/2021 09/07/2021 07/03/2021  WBC 4.0 - 10.5 K/uL 3.7(L) 7.5 7.4  Hemoglobin 13.0 - 17.0 g/dL 13.6 12.7(L) 12.9(L)  Hematocrit 39.0 - 52.0 % 39.6 37.7(L) 37.9(L)  Platelets 150 - 400 K/uL 255 231 247    BMP Latest Ref Rng & Units 12/01/2021 09/07/2021 07/03/2021  Glucose 70 - 99 mg/dL 200(H) 110(H) 110(H)  BUN 8 - 23 mg/dL 14 16 13   Creatinine 0.61 - 1.24 mg/dL 1.01 1.13 1.07  Sodium 135 - 145 mmol/L 136 137 142  Potassium 3.5 - 5.1 mmol/L 4.5 4.0 4.0  Chloride 98 - 111 mmol/L 103 107 109  CO2 22 - 32 mmol/L 23 23 26   Calcium 8.9 - 10.3 mg/dL 9.5 8.9 9.5    BNP    Component Value Date/Time   BNP 24.4 12/27/2020 0825    ProBNP No  results found for: PROBNP  PFT    Component Value Date/Time   FEV1PRE 0.89 07/13/2021 1440   FEV1POST 1.31 07/13/2021 1440   FVCPRE 2.09 07/13/2021 1440   FVCPOST 2.62 07/13/2021 1440   TLC 7.44 07/13/2021 1440   DLCOUNC 13.99 07/13/2021 1440   PREFEV1FVCRT 43 07/13/2021 1440   PSTFEV1FVCRT 50 07/13/2021 1440    DG Chest 2 View  Result Date: 11/30/2021 CLINICAL DATA:  Shortness of breath. EXAM: CHEST - 2 VIEW COMPARISON:  09/07/2021 FINDINGS: Heart size is normal. Stable mild tortuosity of thoracic aorta. Cardiac loop recorder again seen in the left anterior chest wall. Pulmonary hyperinflation again seen, consistent with COPD. No evidence of pulmonary infiltrate or edema. No evidence of pleural effusion. IMPRESSION: COPD. No active cardiopulmonary disease. Electronically Signed   By: Marlaine Hind M.D.   On: 11/30/2021 11:41   CT ABDOMEN PELVIS W CONTRAST  Result Date: 12/01/2021 CLINICAL DATA:  Concern for bowel obstruction. EXAM: CT ABDOMEN AND PELVIS WITH CONTRAST TECHNIQUE: Multidetector CT imaging of the abdomen and pelvis was performed using the standard protocol following bolus administration of intravenous contrast. RADIATION DOSE REDUCTION: This exam was performed according to the departmental dose-optimization program which includes automated exposure control, adjustment of the mA and/or kV according to patient size and/or use of iterative reconstruction technique. CONTRAST:  165mL OMNIPAQUE IOHEXOL 300 MG/ML  SOLN COMPARISON:  CT of the abdomen dated 01/06/2021. CT abdomen pelvis dated 01/07/2020. FINDINGS: Lower chest: The visualized lung bases are clear. No intra-abdominal free air or free fluid. Hepatobiliary: No focal liver abnormality is seen. No gallstones, gallbladder wall thickening, or biliary dilatation. Pancreas: Unremarkable. No pancreatic ductal dilatation or surrounding inflammatory changes. Spleen: Normal in size without focal abnormality. Adrenals/Urinary Tract: The  adrenal glands are unremarkable. There is no hydronephrosis on either side. There is symmetric enhancement and excretion of contrast by both kidneys. Similar appearance of a 2.4 cm indeterminate exophytic lesion from the medial upper pole of the right kidney. Additional smaller bilateral renal lesions also similar to prior CT and not characterized. The visualized ureters and urinary bladder appear unremarkable. Stomach/Bowel: There is sigmoid diverticulosis without active inflammatory changes. There is no bowel obstruction or active inflammation. The appendix is normal. Vascular/Lymphatic: Moderate aortoiliac atherosclerotic disease. The IVC is unremarkable. No portal venous gas. There is no adenopathy. Reproductive: The prostate and seminal vesicles are grossly unremarkable. No pelvic mass Other: None Musculoskeletal: Degenerative changes of the spine. No acute osseous pathology. IMPRESSION: 1. No acute intra-abdominal or pelvic pathology. No bowel obstruction. Normal appendix. 2. Sigmoid diverticulosis. 3. Similar appearance of bilateral renal lesions, not characterized on this CT. 4. Aortic Atherosclerosis (ICD10-I70.0). Electronically Signed   By: Anner Crete M.D.   On: 12/01/2021  23:33     Past medical hx Past Medical History:  Diagnosis Date   Anxiety    Arthritis    Asthma    Atherosclerosis 01/16/2015   Noted on CXR   Bilateral lower extremity edema 01/16/2015   Chronic kidney disease (CKD), stage II (mild)    COPD (chronic obstructive pulmonary disease) (HCC)    Dementia (HCC)    Depression    Dyspnea    GERD (gastroesophageal reflux disease)    Gout    Hepatitis 12/2019   Hx Hep C positive in blood. Treated.   History of colon polyps    Hoarseness    Hyperlipidemia    Hypertension    Lung cancer ([place]) 09/07/2021   Neck abscess 09/03/2016   Pre-diabetes    Pulmonary emphysema ([place])    Seizure ([place])    Stroke ([place]) 2014   Thyroid disease    TIA (transient ischemic attack)     Urinary frequency    Urine retention    UTI (urinary tract infection)      Social History   Tobacco Use   Smoking status: Former    Types: E-cigarettes    Quit date: 01/2014    Years since quitting: 7.9   Smokeless tobacco: Never  Vaping Use   Vaping Use: Never used  Substance Use Topics   Alcohol use: No   Drug use: No    Mr.Lapier reports that he quit smoking about 7 years ago. His smoking use included e-cigarettes. He has never used smokeless tobacco. He reports that he does not drink alcohol and does not use drugs.  Tobacco Cessation: Former smoker , quit 2015   Past surgical hx, Family hx, Social hx all reviewed.  Current Outpatient Medications on File Prior to Visit  Medication Sig   albuterol (PROVENTIL) (2.5 MG/3ML) 0.083% nebulizer solution Take 3 mLs (2.5 mg total) by nebulization every 6 (six) hours as needed for wheezing or shortness of breath.   albuterol (VENTOLIN HFA) 108 (90 Base) MCG/ACT inhaler Inhale 1-2 puffs into the lungs every 6 (six) hours as needed for wheezing or shortness of breath.   allopurinol (ZYLOPRIM) 100 MG tablet Take 100 mg by mouth 2 (two) times daily.   atorvastatin (LIPITOR) 10 MG tablet Take 10 mg by mouth at bedtime.   azithromycin (ZITHROMAX) 250 MG tablet Take 1 tablet (250 mg total) by mouth daily.   chlorthalidone (HYGROTON) 50 MG tablet Take 50 mg by mouth daily.   FLUoxetine (PROZAC) 20 MG capsule Take 20 mg by mouth daily.   fluticasone-salmeterol (WIXELA INHUB) 250-50 MCG/ACT AEPB Inhale 1 puff into the lungs in the morning and at bedtime.   Fluticasone-Umeclidin-Vilant (TRELEGY ELLIPTA) 100-62.5-25 MCG/ACT AEPB Inhale 1 puff into the lungs daily.   Fluticasone-Umeclidin-Vilant (TRELEGY ELLIPTA) 100-62.5-25 MCG/ACT AEPB Inhale 1 puff into the lungs daily.   HYDROcodone-acetaminophen (NORCO) 5-325 MG tablet Take 1 tablet by mouth every 4 (four) hours as needed for moderate pain.   omeprazole (PRILOSEC) 20 MG capsule Take 20  mg by mouth daily.   ondansetron (ZOFRAN-ODT) 4 MG disintegrating tablet Take 1 tablet (4 mg total) by mouth every 8 (eight) hours as needed for nausea.   predniSONE (DELTASONE) 10 MG tablet Take 4 tabs by mouth once daily x4 days, then 3 tabs x4 days, 2 tabs x4 days, 1 tab x4 days and stop.   tamsulosin (FLOMAX) 0.4 MG CAPS capsule Take 0.4 mg by mouth daily.   aspirin EC 81 MG tablet Take 81 mg  by mouth daily. Swallow whole. (Patient not taking: Reported on 09/12/2021)   Tiotropium Bromide Monohydrate (SPIRIVA RESPIMAT) 2.5 MCG/ACT AERS Inhale 2 puffs into the lungs daily.   No current facility-administered medications on file prior to visit.     Allergies  Allergen Reactions   Bee Venom Anaphylaxis    Face gets red and hard to breathe     Review Of Systems:  Constitutional:   No  weight loss, night sweats,  Fevers, chills, ++fatigue, or  lassitude.  HEENT:   No headaches,  Difficulty swallowing,  Tooth/dental problems, or  Sore throat,                No sneezing, itching, ear ache, nasal congestion, post nasal drip,   CV:  No chest pain,  Orthopnea, PND, swelling in lower extremities, anasarca, dizziness, palpitations, syncope.   GI  No heartburn, indigestion, abdominal pain, nausea, vomiting, diarrhea, change in bowel habits, loss of appetite, bloody stools.   Resp: + shortness of breath with exertion or at rest.  No excess mucus, no productive cough,  No non-productive cough,  No coughing up of blood.  No change in color of mucus.  + wheezing.  No chest wall deformity  Skin: no rash or lesions.  GU: no dysuria, change in color of urine, no urgency or frequency.  No flank pain, no hematuria   MS:  No joint pain or swelling.  No decreased range of motion.  No back pain.  Psych:  No change in mood or affect. No depression or anxiety.  No memory loss.   Vital Signs BP 110/70 (BP Location: Left Arm, Cuff Size: Normal)    Pulse 85    Temp 98.2 F (36.8 C) (Oral)    Ht 5\' 9"   (1.753 m)    Wt 186 lb 6.4 oz (84.6 kg)    SpO2 97%    BMI 27.53 kg/m    Physical Exam:  General- No distress,  A&Ox3, pleasant but quiet ENT: No sinus tenderness, TM clear, pale nasal mucosa, no oral exudate,no post nasal drip, no LAN Cardiac: S1, S2, regular rate and rhythm, no murmur Chest: + wheeze/ No rales/ dullness; no accessory muscle use, no nasal flaring, no sternal retractions Abd.: Soft Non-tender, ND, BS +, Body mass index is 27.53 kg/m. Ext: No clubbing cyanosis, edema Neuro:  Physically deconditioned, MAE x 4, Alert to self and place Skin: No rashes, warm and dry,no lesions  Psych: Flat affect   Assessment/Plan Slow to resolve COPD Flare Sats 95% on RA , but fatigue and New stumbling noted per patient wife Plan We will do a CBC today to make sure you do not have anemia. We will do a CXR today >> stat. We will call you with the results. I will talk with Dr. Valeta Harms about getting an MRI brain.  We will send you home with [facility] Once you get the [facility] use 2 puffs in the morning and 2 puffs in the evening every day without fail Rinse mouth after use Albuterol nebs for breakthrough shortness of breath or wheezing.  Follow up in 1 month with Judson Roch NP or Dr. Valeta Harms.  Please contact office for sooner follow up if symptoms do not improve or worsen or seek emergency care   Stage IA2, cT1bN0M0, NSCLC, adenocarcinoma of the LUL Former smoker New change in personality and stumbling Plan Completed  Ultrahypofractionated Radiotherapy 11/09/2021 through 11/22/2021 Follow up Surveillance CT Chest per radiation oncology Consider MRI brain   I  spent 50  minutes dedicated to the care of this patient on the date of this encounter to include pre-visit review of records, face-to-face time with the patient discussing conditions above, post visit ordering of testing, clinical documentation with the electronic health record, making appropriate referrals as documented, and communicating  necessary information to the patient's healthcare team.    Magdalen Spatz, NP 12/23/2021  9:27 AM

## 2021-12-26 ENCOUNTER — Other Ambulatory Visit: Payer: Self-pay | Admitting: Acute Care

## 2021-12-26 DIAGNOSIS — R4182 Altered mental status, unspecified: Secondary | ICD-10-CM

## 2021-12-29 ENCOUNTER — Telehealth: Payer: Self-pay

## 2021-12-29 NOTE — Telephone Encounter (Signed)
Called and spoke with patient's wife [name]. She verbalized understanding of the MRI. She is aware that she will receive a call from the [facility].   Nothing further needed at time of call.

## 2021-12-29 NOTE — Telephone Encounter (Signed)
-----   Message from Magdalen Spatz, NP sent at 12/26/2021  7:59 AM EST ----- Please call patient's wife. Let her know we will do an MRI of the patient's brain. Let her know I want to check to see if we can determine why he is stumbling . I will place the order. Thanks ----- Message ----- From: Garner Nash, DO Sent: 12/26/2021   7:39 AM EST To: Magdalen Spatz, NP  Sure no harm in that BLI    ----- Message ----- From: Magdalen Spatz, NP Sent: 12/23/2021   7:23 PM EST To: Garner Nash, DO  Per patient wife, patient has been stumbling and starting off into space. HGB has dropped 1 gram but is > 12. He has a colonoscopy scheduled for early March.  CXR looks ok Should we do MRI brain? I cannot see that he got one.

## 2022-01-03 ENCOUNTER — Emergency Department (HOSPITAL_COMMUNITY): Payer: No Typology Code available for payment source

## 2022-01-03 ENCOUNTER — Encounter (HOSPITAL_COMMUNITY): Payer: Self-pay

## 2022-01-03 ENCOUNTER — Other Ambulatory Visit: Payer: Self-pay

## 2022-01-03 ENCOUNTER — Emergency Department (HOSPITAL_COMMUNITY)
Admission: EM | Admit: 2022-01-03 | Discharge: 2022-01-03 | Disposition: A | Discharge status: Home / Self Care | Payer: No Typology Code available for payment source | Attending: Emergency Medicine | Admitting: Emergency Medicine

## 2022-01-03 DIAGNOSIS — Z7951 Long term (current) use of inhaled steroids: Secondary | ICD-10-CM | POA: Insufficient documentation

## 2022-01-03 DIAGNOSIS — Z85038 Personal history of other malignant neoplasm of large intestine: Secondary | ICD-10-CM | POA: Insufficient documentation

## 2022-01-03 DIAGNOSIS — R112 Nausea with vomiting, unspecified: Secondary | ICD-10-CM

## 2022-01-03 DIAGNOSIS — R109 Unspecified abdominal pain: Secondary | ICD-10-CM | POA: Insufficient documentation

## 2022-01-03 DIAGNOSIS — Z7982 Long term (current) use of aspirin: Secondary | ICD-10-CM | POA: Insufficient documentation

## 2022-01-03 DIAGNOSIS — Z85118 Personal history of other malignant neoplasm of bronchus and lung: Secondary | ICD-10-CM | POA: Insufficient documentation

## 2022-01-03 DIAGNOSIS — R197 Diarrhea, unspecified: Secondary | ICD-10-CM | POA: Diagnosis not present

## 2022-01-03 DIAGNOSIS — Z79899 Other long term (current) drug therapy: Secondary | ICD-10-CM | POA: Insufficient documentation

## 2022-01-03 DIAGNOSIS — Z86011 Personal history of benign neoplasm of the brain: Secondary | ICD-10-CM | POA: Diagnosis not present

## 2022-01-03 LAB — URINALYSIS, ROUTINE W REFLEX MICROSCOPIC
Bilirubin Urine: NEGATIVE
Glucose, UA: NEGATIVE mg/dL
Hgb urine dipstick: NEGATIVE
Ketones, ur: NEGATIVE mg/dL
Leukocytes,Ua: NEGATIVE
Nitrite: NEGATIVE
Protein, ur: NEGATIVE mg/dL
Specific Gravity, Urine: 1.013 (ref 1.005–1.030)
pH: 6 (ref 5.0–8.0)

## 2022-01-03 LAB — CBC
HCT: 39.1 % (ref 39.0–52.0)
Hemoglobin: 13.2 g/dL (ref 13.0–17.0)
MCH: 31.4 pg (ref 26.0–34.0)
MCHC: 33.8 g/dL (ref 30.0–36.0)
MCV: 92.9 fL (ref 80.0–100.0)
Platelets: 319 10*3/uL (ref 150–400)
RBC: 4.21 MIL/uL — ABNORMAL LOW (ref 4.22–5.81)
RDW: 12.3 % (ref 11.5–15.5)
WBC: 5 10*3/uL (ref 4.0–10.5)
nRBC: 0 % (ref 0.0–0.2)

## 2022-01-03 LAB — COMPREHENSIVE METABOLIC PANEL
ALT: 9 U/L (ref 0–44)
AST: 12 U/L — ABNORMAL LOW (ref 15–41)
Albumin: 4 g/dL (ref 3.5–5.0)
Alkaline Phosphatase: 63 U/L (ref 38–126)
Anion gap: 6 (ref 5–15)
BUN: 12 mg/dL (ref 8–23)
CO2: 26 mmol/L (ref 22–32)
Calcium: 9.2 mg/dL (ref 8.9–10.3)
Chloride: 104 mmol/L (ref 98–111)
Creatinine, Ser: 1.1 mg/dL (ref 0.61–1.24)
GFR, Estimated: >60 mL/min (ref >60)
Glucose, Bld: 87 mg/dL (ref 70–99)
Potassium: 4.6 mmol/L (ref 3.5–5.1)
Sodium: 136 mmol/L (ref 135–145)
Total Bilirubin: 0.6 mg/dL (ref 0.3–1.2)
Total Protein: 7.2 g/dL (ref 6.5–8.1)

## 2022-01-03 LAB — LIPASE, BLOOD: Lipase: 28 U/L (ref 11–51)

## 2022-01-03 IMAGING — CT CT HEAD WO/W CM
3 of 4 series · 14 of 47 positions shown, 16 images · IV contrast (agent unspecified)
Comparison: Noncontrast head CT [DATE]

CLINICAL DATA: Brain metastases suspected. Staring, nausea,
vomiting, and diarrhea.

EXAM:
CT HEAD WITHOUT AND WITH CONTRAST
TECHNIQUE: Contiguous axial images were obtained from the base of the skull
through the vertex without and with intravenous contrast.

[Series 3: head wo · axial · 0.43mm/px · z∈[-160,-25]mm · 8 of 33 slices shown, 10 images]
[im 3/33  brain]
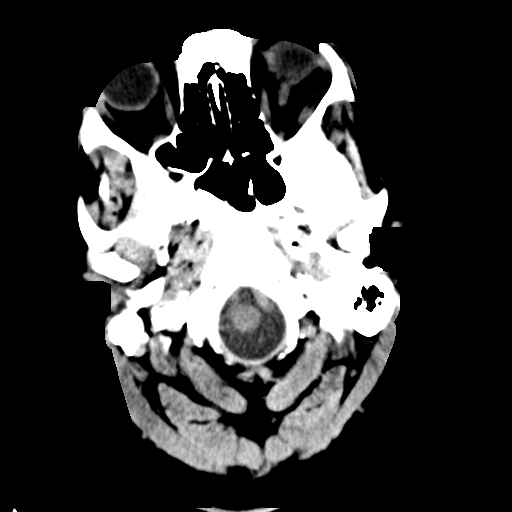
[im 3/33  bone]
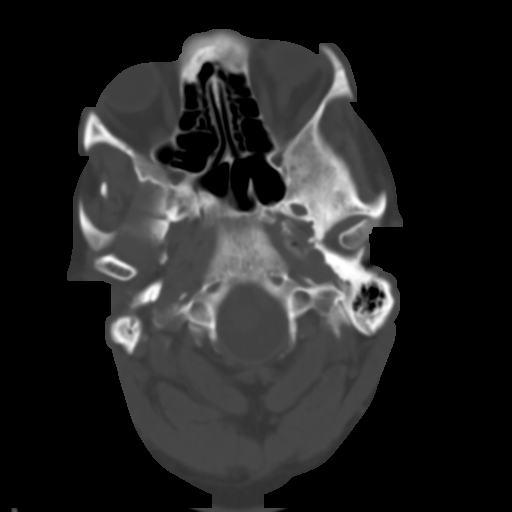
[im 7/33  brain]
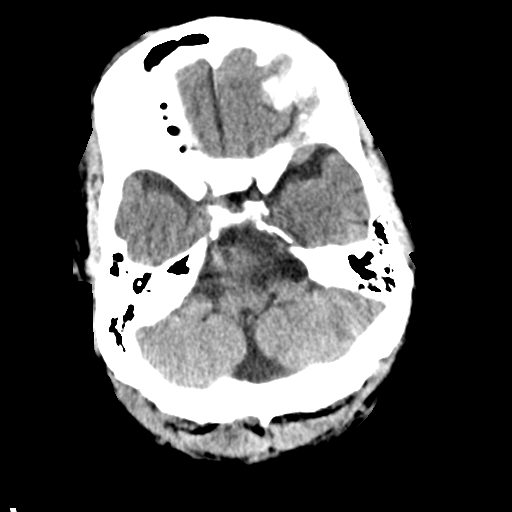
[im 12/33  brain]
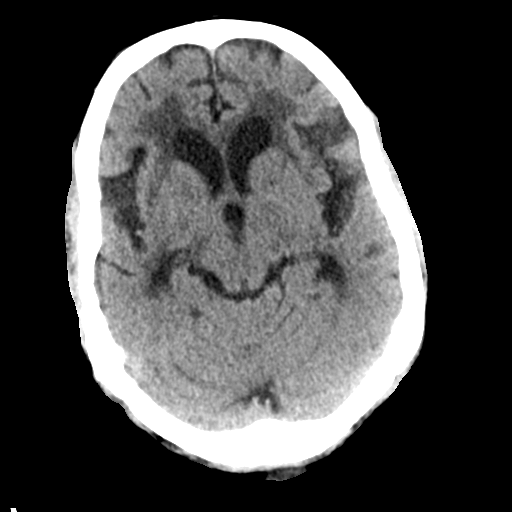
[im 14/33  brain]
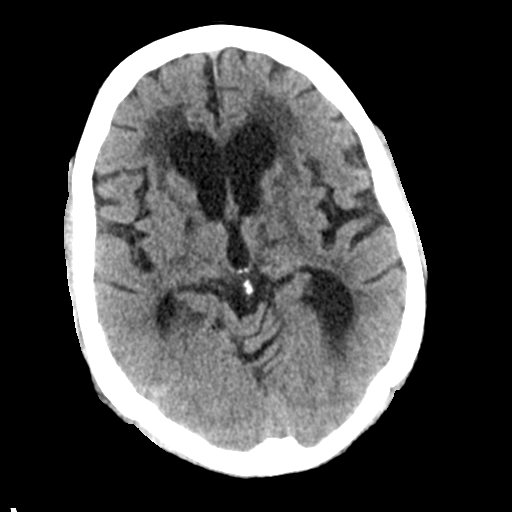
[im 19/33  brain]
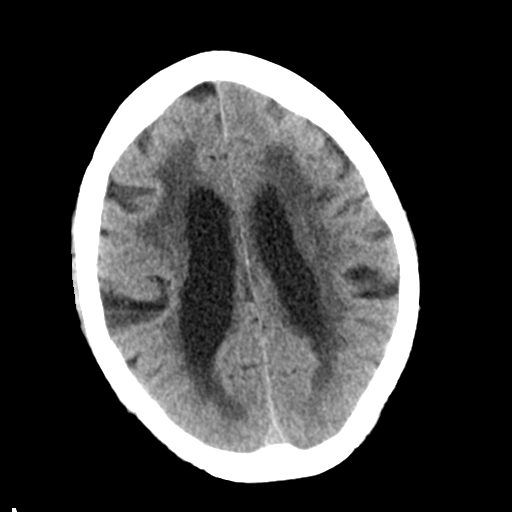
[im 19/33  bone]
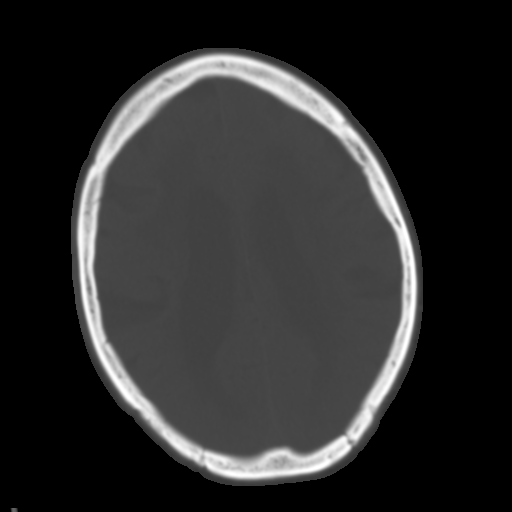
[im 21/33  brain]
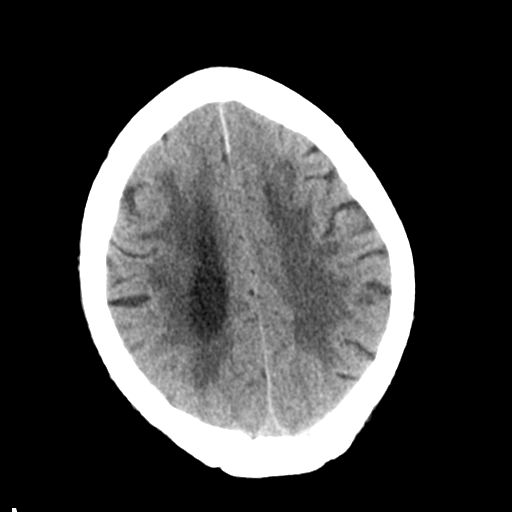
[im 26/33  brain]
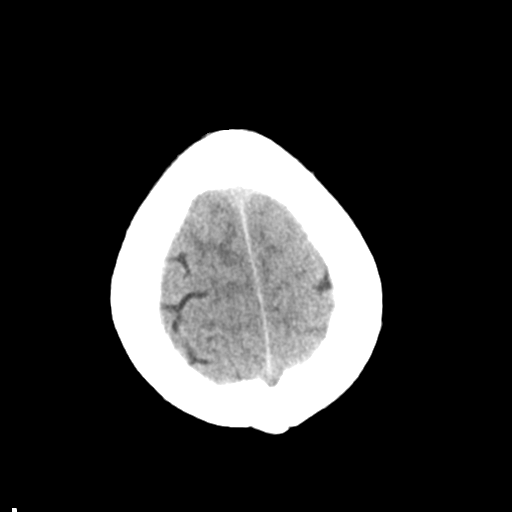
[im 30/33  brain]
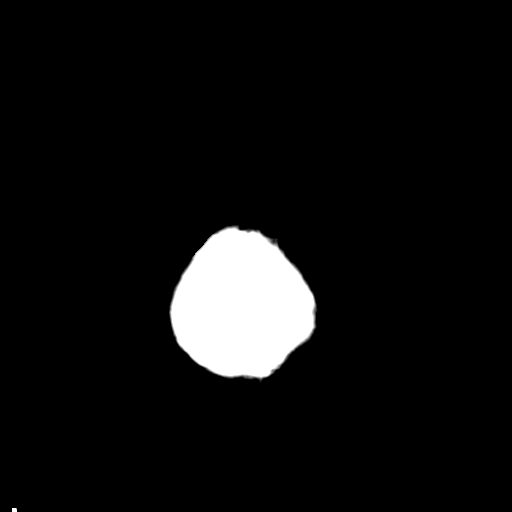

[Series 6: coronal soft tissue · coronal · 0.31mm/px · 3 of 71 slices shown]
[im 24/71  brain]
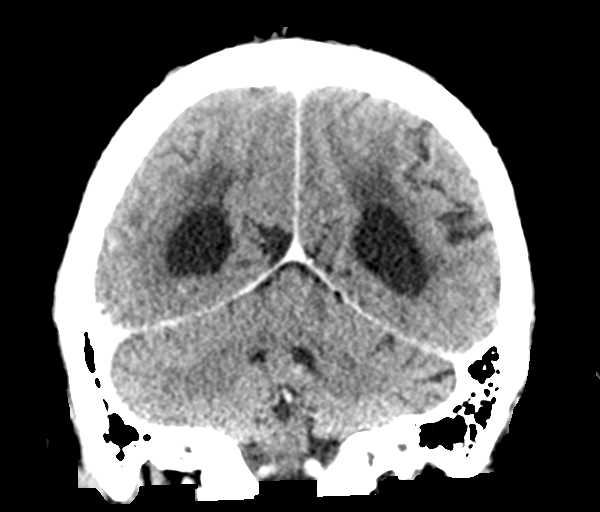
[im 32/71  brain]
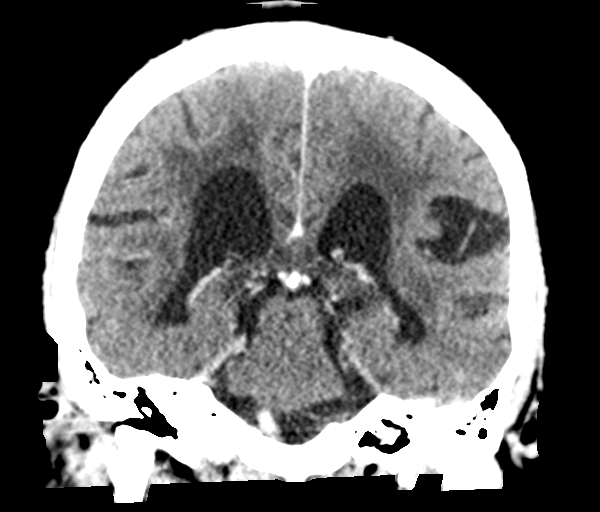
[im 39/71  brain]
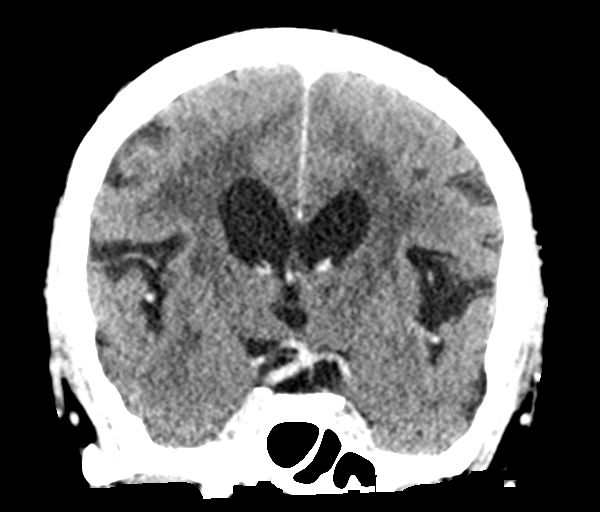

[Series 7: sagittal soft tissue · sagittal · 0.31mm/px · 3 of 62 slices shown]
[im 21/62  brain]
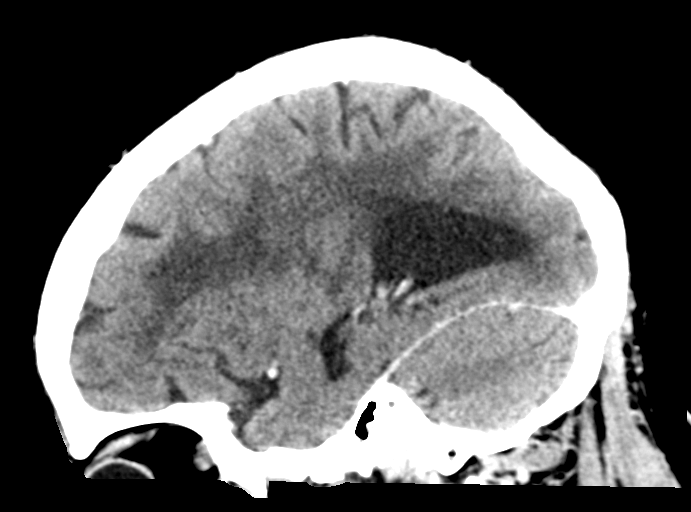
[im 31/62  brain]
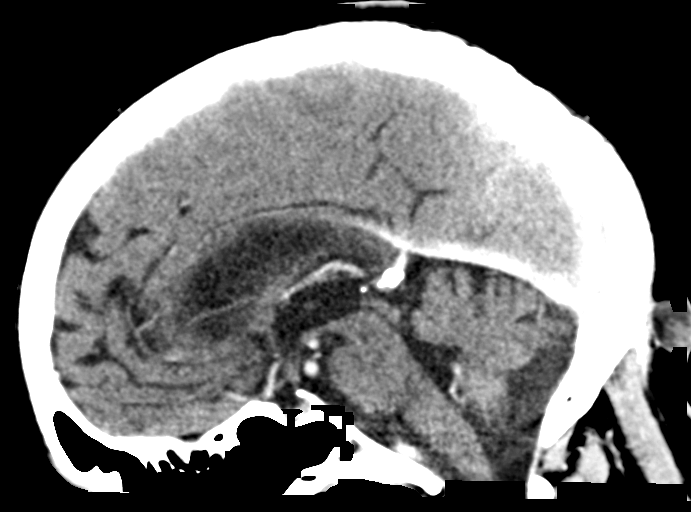
[im 41/62  brain]
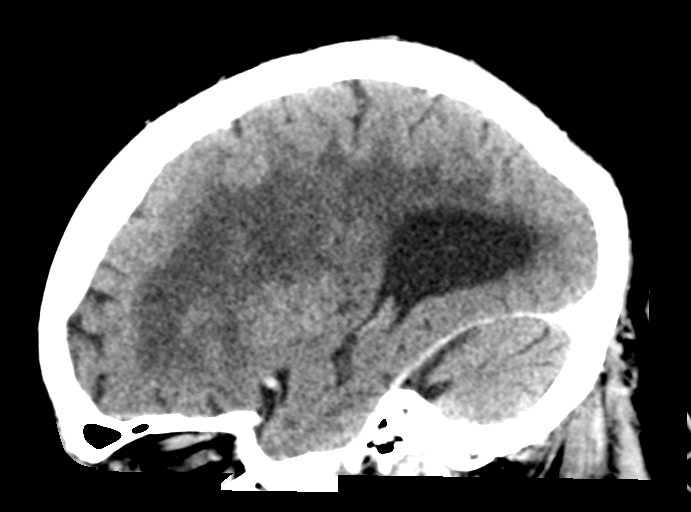

[14 of 47 positions shown; findings below may reference images not displayed]

RADIATION DOSE REDUCTION: This exam was performed according to the
departmental dose-optimization program which includes automated
exposure control, adjustment of the mA and/or kV according to
patient size and/or use of iterative reconstruction technique.

CONTRAST:  100mL OMNIPAQUE IOHEXOL 300 MG/ML  SOLN
FINDINGS: Brain: There is no evidence of an acute infarct, intracranial
hemorrhage, midline shift, or extra-axial fluid collection. Chronic
lacunar infarcts are again seen in the left thalamus and bilateral
basal ganglia. Confluent hypodensities in the cerebral white matter
bilaterally have mildly progressed and are nonspecific but
compatible with severe chronic small vessel ischemic disease. Mild
lateral ventriculomegaly is unchanged and favored to reflect central
predominant cerebral atrophy rather than hydrocephalus. An enhancing
1.4 x 0.9 cm mass projecting superiorly from the left petrous ridge
along the undersurface of the left temporal lobe appears extra-axial
and partially calcified, and there is no associated brain edema.

Vascular: Calcified atherosclerosis at the skull base. The major
dural venous sinuses and large intracranial arteries are grossly
patent.

Skull: No fracture or suspicious osseous lesion.

Sinuses/Orbits: Visualized paranasal sinuses and mastoid air cells
are clear. The included portions of the orbits are unremarkable.

Other: 2 cm low-density subcutaneous nodule in the midline of the
occipital scalp, likely an inclusion cyst.
IMPRESSION: 1. No evidence of acute intracranial abnormality.
2. Severe chronic small vessel ischemic disease.
3. 1.4 cm mass along the undersurface of the left temporal lobe most
consistent with a meningioma.

## 2022-01-03 MED ORDER — IOHEXOL 300 MG/ML  SOLN
100.0000 mL | Freq: Once | INTRAMUSCULAR | Status: AC | PRN
  Administered 2022-01-03: 100 mL via INTRAVENOUS

## 2022-01-03 MED ORDER — SODIUM CHLORIDE 0.9 % IV BOLUS
1000.0000 mL | Freq: Once | INTRAVENOUS | Status: AC
  Administered 2022-01-03: 1000 mL via INTRAVENOUS

## 2022-01-03 NOTE — ED Notes (Signed)
[facility]-states [facility] of [facility] and SZ-sending to ED for imaging-states possible brain METZ-wife reports an increase in patient "staring off" into space

## 2022-01-03 NOTE — Discharge Instructions (Addendum)
Follow-up with Dr. Venetia Constable in the next couple weeks for that small lesion found on the head CT.  Also follow-up with Dr. Mickeal Skinner in the next month to also help with evaluating the lesion in the brain.  Follow-up with your doctors at the [place] as planned.  I will send with you a copy of all the testing done

## 2022-01-03 NOTE — ED Notes (Signed)
Pt wheeled to waiting room. Pt verbalized understanding of discharge instructions.   

## 2022-01-03 NOTE — ED Provider Notes (Signed)
[facility] DEPT Provider Note   CSN: [id number] Arrival date & time: 01/03/22  0051     History  Chief Complaint  Patient presents with   Emesis   Diarrhea    Bruce Sullivan is a 71 y.o. male.  Patient has a history of lung cancer.  He presents with chronic diarrhea that is been going on for weeks along with staring spells that also has been going on  4 weeks.  His primary care doctor sent him over here for an MRI of the brain for the staring spells but we cannot get an MRI because of the pacemaker he has.  The history is provided by the patient and medical records. No language interpreter was used.  Diarrhea Quality:  Semi-solid Severity:  Moderate Onset quality:  Sudden Timing:  Intermittent Progression:  Unchanged Relieved by:  None tried Worsened by:  Nothing Associated symptoms: no abdominal pain and no headaches   Risk factors: no recent antibiotic use       Home Medications Prior to Admission medications   Medication Sig Start Date End Date Taking? Authorizing Provider  albuterol (PROVENTIL) (2.5 MG/3ML) 0.083% nebulizer solution Take 3 mLs (2.5 mg total) by nebulization every 6 (six) hours as needed for wheezing or shortness of breath. 07/27/21   Icard, Leory Plowman L, DO  albuterol (VENTOLIN HFA) 108 (90 Base) MCG/ACT inhaler Inhale 1-2 puffs into the lungs every 6 (six) hours as needed for wheezing or shortness of breath. 12/27/20   Luna Fuse, MD  allopurinol (ZYLOPRIM) 100 MG tablet Take 100 mg by mouth 2 (two) times daily. 08/02/18   [provider]  aspirin EC 81 MG tablet Take 81 mg by mouth daily. Swallow whole. Patient not taking: Reported on 09/12/2021    [provider]  atorvastatin (LIPITOR) 10 MG tablet Take 10 mg by mouth at bedtime.    [provider]  azithromycin (ZITHROMAX) 250 MG tablet Take 1 tablet (250 mg total) by mouth daily. 11/30/21   Icard, Octavio Graves, DO  Budeson-Glycopyrrol-Formoterol  (BREZTRI AEROSPHERE) 160-9-4.8 MCG/ACT AERO Inhale 2 puffs into the lungs in the morning and at bedtime. 12/23/21   Magdalen Spatz, NP  chlorthalidone (HYGROTON) 50 MG tablet Take 50 mg by mouth daily.    [provider]  FLUoxetine (PROZAC) 20 MG capsule Take 20 mg by mouth daily. 08/02/18   [provider]  fluticasone-salmeterol (WIXELA INHUB) 250-50 MCG/ACT AEPB Inhale 1 puff into the lungs in the morning and at bedtime. 09/14/21   Martyn Ehrich, NP  Fluticasone-Umeclidin-Vilant (TRELEGY ELLIPTA) 100-62.5-25 MCG/ACT AEPB Inhale 1 puff into the lungs daily. 09/07/21   Icard, Octavio Graves, DO  Fluticasone-Umeclidin-Vilant (TRELEGY ELLIPTA) 100-62.5-25 MCG/ACT AEPB Inhale 1 puff into the lungs daily. 09/12/21   Martyn Ehrich, NP  HYDROcodone-acetaminophen (NORCO) 5-325 MG tablet Take 1 tablet by mouth every 4 (four) hours as needed for moderate pain. 11/20/18   Ceasar Mons, MD  omeprazole (PRILOSEC) 20 MG capsule Take 20 mg by mouth daily.    [provider]  ondansetron (ZOFRAN-ODT) 4 MG disintegrating tablet Take 1 tablet (4 mg total) by mouth every 8 (eight) hours as needed for nausea. 12/02/21   Larene Pickett, PA-C  predniSONE (DELTASONE) 10 MG tablet Take 4 tabs by mouth once daily x4 days, then 3 tabs x4 days, 2 tabs x4 days, 1 tab x4 days and stop. 11/30/21   Garner Nash, DO  tamsulosin (FLOMAX) 0.4 MG CAPS  capsule Take 0.4 mg by mouth daily.    [provider]  Tiotropium Bromide Monohydrate (SPIRIVA RESPIMAT) 2.5 MCG/ACT AERS Inhale 2 puffs into the lungs daily. 09/14/21 12/13/21  Martyn Ehrich, NP      Allergies    Bee venom    Review of Systems   Review of Systems  Constitutional:  Negative for appetite change and fatigue.  HENT:  Negative for congestion, ear discharge and sinus pressure.   Eyes:  Negative for discharge.  Respiratory:  Negative for cough.   Cardiovascular:  Negative for chest pain.  Gastrointestinal:   Positive for diarrhea. Negative for abdominal pain.  Genitourinary:  Negative for frequency and hematuria.  Musculoskeletal:  Negative for back pain.  Skin:  Negative for rash.  Neurological:  Negative for seizures and headaches.       Occasional staring spell  Psychiatric/Behavioral:  Negative for hallucinations.    Physical Exam Updated Vital Signs BP (!) 160/101    Pulse 65    Temp 98.1 F (36.7 C) (Oral)    Resp 18    Ht 5\' 9"  (1.753 m)    Wt 84.4 kg    SpO2 97%    BMI 27.47 kg/m  Physical Exam Vitals and nursing note reviewed.  Constitutional:      Appearance: He is well-developed.  HENT:     Head: Normocephalic.     Nose: Nose normal.  Eyes:     General: No scleral icterus.    Conjunctiva/sclera: Conjunctivae normal.  Neck:     Thyroid: No thyromegaly.  Cardiovascular:     Rate and Rhythm: Normal rate and regular rhythm.     Heart sounds: No murmur heard.   No friction rub. No gallop.  Pulmonary:     Breath sounds: No stridor. No wheezing or rales.  Chest:     Chest wall: No tenderness.  Abdominal:     General: There is no distension.     Tenderness: There is no abdominal tenderness. There is no rebound.  Musculoskeletal:        General: Normal range of motion.     Cervical back: Neck supple.  Lymphadenopathy:     Cervical: No cervical adenopathy.  Skin:    Findings: No erythema or rash.  Neurological:     Mental Status: He is alert and oriented to person, place, and time.     Motor: No abnormal muscle tone.     Coordination: Coordination normal.  Psychiatric:        Behavior: Behavior normal.    ED Results / Procedures / Treatments   Labs (all labs ordered are listed, but only abnormal results are displayed) Labs Reviewed  COMPREHENSIVE METABOLIC PANEL - Abnormal; Notable for the following components:      Result Value   AST 12 (*)    All other components within normal limits  CBC - Abnormal; Notable for the following components:   RBC 4.21 (*)     All other components within normal limits  LIPASE, BLOOD  URINALYSIS, ROUTINE W REFLEX MICROSCOPIC    EKG None  Radiology CT Head W or Wo Contrast  Result Date: 01/03/2022 CLINICAL DATA:  Brain metastases suspected. Staring, nausea, vomiting, and diarrhea. EXAM: CT HEAD WITHOUT AND WITH CONTRAST TECHNIQUE: Contiguous axial images were obtained from the base of the skull through the vertex without and with intravenous contrast. RADIATION DOSE REDUCTION: This exam was performed according to the departmental dose-optimization program which includes automated exposure control, adjustment of  the mA and/or kV according to patient size and/or use of iterative reconstruction technique. CONTRAST:  13mL OMNIPAQUE IOHEXOL 300 MG/ML  SOLN COMPARISON:  Noncontrast head CT 12/20/2014 FINDINGS: Brain: There is no evidence of an acute infarct, intracranial hemorrhage, midline shift, or extra-axial fluid collection. Chronic lacunar infarcts are again seen in the left thalamus and bilateral basal ganglia. Confluent hypodensities in the cerebral white matter bilaterally have mildly progressed and are nonspecific but compatible with severe chronic small vessel ischemic disease. Mild lateral ventriculomegaly is unchanged and favored to reflect central predominant cerebral atrophy rather than hydrocephalus. An enhancing 1.4 x 0.9 cm mass projecting superiorly from the left petrous ridge along the undersurface of the left temporal lobe appears extra-axial and partially calcified, and there is no associated brain edema. Vascular: Calcified atherosclerosis at the skull base. The major dural venous sinuses and large intracranial arteries are grossly patent. Skull: No fracture or suspicious osseous lesion. Sinuses/Orbits: Visualized paranasal sinuses and mastoid air cells are clear. The included portions of the orbits are unremarkable. Other: 2 cm low-density subcutaneous nodule in the midline of the occipital scalp, likely an  inclusion cyst. IMPRESSION: 1. No evidence of acute intracranial abnormality. 2. Severe chronic small vessel ischemic disease. 3. 1.4 cm mass along the undersurface of the left temporal lobe most consistent with a meningioma. Electronically Signed   By: Logan Bores M.D.   On: 01/03/2022 14:05   CT ABDOMEN PELVIS W CONTRAST  Result Date: 01/03/2022 CLINICAL DATA:  Abdominal pain. EXAM: CT ABDOMEN AND PELVIS WITH CONTRAST TECHNIQUE: Multidetector CT imaging of the abdomen and pelvis was performed using the standard protocol following bolus administration of intravenous contrast. RADIATION DOSE REDUCTION: This exam was performed according to the departmental dose-optimization program which includes automated exposure control, adjustment of the mA and/or kV according to patient size and/or use of iterative reconstruction technique. CONTRAST:  121mL OMNIPAQUE IOHEXOL 300 MG/ML  SOLN COMPARISON:  12/01/2021, 01/23/2020 FINDINGS: Lower chest: Right middle lobe scarring. Hepatobiliary: Low-attenuation of the liver as can be seen with hepatic steatosis. No focal hepatic mass. No gallstones, gallbladder wall thickening, or biliary dilatation. Pancreas: Unremarkable. No pancreatic ductal dilatation or surrounding inflammatory changes. Spleen: Normal in size without focal abnormality. Adrenals/Urinary Tract: Adrenal glands are unremarkable. 18 mm hypodense, fluid attenuating right interpolar renal mass consistent with a cyst. 2.1 cm mass in the interpolar aspect of the right kidney measuring 45 Hounsfield units of indeterminate etiology. Small stable 8 mm hypodense left interpolar renal mass likely reflecting a small cyst. No urolithiasis or obstructive uropathy. Bladder is unremarkable. 2.6 cm cystic mass along the anterior aspect of the bladder likely reflecting a urachal cyst. Stomach/Bowel: Stomach is within normal limits. No evidence of bowel wall thickening, distention, or inflammatory changes. Vascular/Lymphatic:  Normal caliber abdominal aorta with mild atherosclerosis. No lymphadenopathy. Reproductive: Prostate is unremarkable. Other: No abdominal wall hernia or abnormality. No abdominopelvic ascites. Musculoskeletal: No acute osseous abnormality. No aggressive osseous lesion. Degenerative disease with disc height loss at L4-5 and L5-S1. Broad-based disc osteophyte complex at L4-5 and L5-S1 with bilateral facet arthropathy. Bilateral foraminal narrowing at L4-5 and L5-S1. Mild bilateral joint space narrowing of the hips. IMPRESSION: 1. No acute abdominal or pelvic pathology. 2. Indeterminate 2.1 cm mass in the interpolar aspect of the right kidney measuring 45 Hounsfield units of indeterminate etiology. This is unchanged compared with 01/23/2020. 3. A 2.6 cm cystic mass along the anterior aspect of the bladder likely reflecting a urachal cyst. 4. Aortic Atherosclerosis (ICD10-I70.0). Electronically  Signed   By: Kathreen Devoid M.D.   On: 01/03/2022 13:26    Procedures Procedures    Medications Ordered in ED Medications  sodium chloride 0.9 % bolus 1,000 mL (1,000 mLs Intravenous New Bag/Given 01/03/22 1128)  iohexol (OMNIPAQUE) 300 MG/ML solution 100 mL (100 mLs Intravenous Contrast Given 01/03/22 1252)    ED Course/ Medical Decision Making/ A&P CRITICAL CARE Performed by: Milton Ferguson Total critical care time: 45 minutes Critical care time was exclusive of separately billable procedures and treating other patients. Critical care was necessary to treat or prevent imminent or life-threatening deterioration. Critical care was time spent personally by me on the following activities: development of treatment plan with patient and/or surrogate as well as nursing, discussions with consultants, evaluation of patient's response to treatment, examination of patient, obtaining history from patient or surrogate, ordering and performing treatments and interventions, ordering and review of laboratory studies, ordering and  review of radiographic studies, pulse oximetry and re-evaluation of patient's condition.    Patient with lung cancer and staring spells for weeks.  CT scan of the head shows what appears to be a meningioma.  I spoke with neurosurgery and they stated the patient can follow-up with them in a couple weeks and also follow-up with Dr. Mickeal Skinner in a few weeks.   He has chronic diarrhea and will follow up with PCP his CT abdomen was negative                         Medical Decision Making Amount and/or Complexity of Data Reviewed Labs: ordered. Radiology: ordered.  Risk Prescription drug management.   Patient with chronic diarrhea and colon cancer has a meningioma in the brain.  He will follow-up as outpatient with neurosurgery and his primary care    This patient presents to the ED for concern of diarrhea and staring spell, this involves an extensive number of treatment options, and is a complaint that carries with it a high risk of complications and morbidity.  The differential diagnosis includes infectious diarrhea, stroke   Co morbidities that complicate the patient evaluation  Colon cancer   Additional history obtained:  Additional history obtained from spouse External records from outside source obtained and reviewed including hospital record   Lab Tests:  I Ordered, and personally interpreted labs.  The pertinent results include: CBC and chemistries which were unremarkable   Imaging Studies ordered:  I ordered imaging studies including CT head and CT abdomen I independently visualized and interpreted imaging which showed small meningioma in the brain I agree with the radiologist interpretation   Cardiac Monitoring:  The patient was maintained on a cardiac monitor.  I personally viewed and interpreted the cardiac monitored which showed an underlying rhythm of: Normal sinus rhythm   Medicines ordered and prescription drug management:  I ordered medication including  normal saline for dehydration Reevaluation of the patient after these medicines showed that the patient improved I have reviewed the patients home medicines and have made adjustments as needed   Test Considered:  None   Critical Interventions:  Spoke to neurosurgery   Consultations Obtained:  I requested consultation with the neurosurgery,  and discussed lab and imaging findings as well as pertinent plan - they recommend: Follow-up as an outpatient   Problem List / ED Course:  Colon cancer and meningioma    Social Determinants of Health:  None  Patient will follow up with neurosurgery as an outpatient and follow-up with PCP  Final Clinical Impression(s) / ED Diagnoses Final diagnoses:  Nausea vomiting and diarrhea    Rx / DC Orders ED Discharge Orders     None         Milton Ferguson, MD 01/05/22 [phone]

## 2022-01-03 NOTE — ED Triage Notes (Signed)
Patient's wife reports that the [place] wanted her to bring the patient to the ED for an MRI due to continuous staring, N/v/D. Patient's wife reports that they were concerned about a possible seizure or brain mets. Wife reports that the patient was seen on 2/27 for the N/V/D and staring has been ongoing x 2-3 months.

## 2022-01-20 ENCOUNTER — Ambulatory Visit: Payer: No Typology Code available for payment source | Admitting: Pulmonary Disease

## 2022-01-20 ENCOUNTER — Other Ambulatory Visit: Payer: Self-pay

## 2022-01-20 ENCOUNTER — Ambulatory Visit (INDEPENDENT_AMBULATORY_CARE_PROVIDER_SITE_OTHER): Payer: No Typology Code available for payment source | Admitting: Pulmonary Disease

## 2022-01-20 ENCOUNTER — Encounter: Payer: Self-pay | Admitting: Pulmonary Disease

## 2022-01-20 VITALS — BP 154/84 | HR 81 | Temp 98.7°F | Ht 69.0 in | Wt 190.8 lb

## 2022-01-20 DIAGNOSIS — C3492 Malignant neoplasm of unspecified part of left bronchus or lung: Secondary | ICD-10-CM | POA: Diagnosis not present

## 2022-01-20 DIAGNOSIS — Z87891 Personal history of nicotine dependence: Secondary | ICD-10-CM

## 2022-01-20 DIAGNOSIS — C3490 Malignant neoplasm of unspecified part of unspecified bronchus or lung: Secondary | ICD-10-CM | POA: Diagnosis not present

## 2022-01-20 DIAGNOSIS — J449 Chronic obstructive pulmonary disease, unspecified: Secondary | ICD-10-CM | POA: Diagnosis not present

## 2022-01-20 DIAGNOSIS — G9389 Other specified disorders of brain: Secondary | ICD-10-CM

## 2022-01-20 DIAGNOSIS — J432 Centrilobular emphysema: Secondary | ICD-10-CM | POA: Diagnosis not present

## 2022-01-20 MED ORDER — BREZTRI AEROSPHERE 160-9-4.8 MCG/ACT IN AERO
2.0000 | INHALATION_SPRAY | Freq: Two times a day (BID) | RESPIRATORY_TRACT | 0 refills | Status: DC
Start: 2022-01-20 — End: 2022-03-10

## 2022-01-20 MED ORDER — BREZTRI AEROSPHERE 160-9-4.8 MCG/ACT IN AERO: 2.0000 | INHALATION_SPRAY | Freq: Two times a day (BID) | RESPIRATORY_TRACT | 3 refills | Status: DC

## 2022-01-20 NOTE — Patient Instructions (Addendum)
Thank you for visiting Dr. Valeta Harms at [facility]. ?Today we recommend the following: ? ?Stay on breztri inhaler ?More samples and new prescription today sent to [facility]  ? ?Return in about 6 months (around 07/23/2022) for with Eric Form, NP, or Dr. Valeta Harms. ? ? ? ?Please do your part to reduce the spread of COVID-19.  ? ?

## 2022-01-20 NOTE — Addendum Note (Signed)
Addended by: Fran Lowes on: 01/20/2022 02:12 PM ? ? Modules accepted: Orders ? ?

## 2022-01-20 NOTE — Progress Notes (Signed)
? ?Synopsis: Referred in September 2022 for lung nodule by Wenda Low, MD ? ?Subjective:  ? ?PATIENT ID: Bruce Sullivan GENDER: male DOB: 09-10-1951, MRN: [id number] ? ?Chief Complaint  ?Patient presents with  ? Follow-up  ?  Follow up.   ? ? ?This is a 71 year old gentleman, history of COPD, chronic kidney disease, hypertension, hyperlipidemia, TIA, stroke.  Presents for evaluation of lung nodule. Patient had a nuclear medicine PET scan in March 2022 which revealed a 11 x 7 x 8 mm left upper lobe pleural-based nodule that had an SUV of 2.  Recommended at the time close CT surveillance follow-up. ? ?OV 08/15/2021: Here today for follow-up after surgical consultation.  Dr. Kipp Brood felt that patient would be better served by having robotic assisted bronchoscopy fiducial placement and radiation therapy versus undergoing surgical lobectomy.  After discussing the risk-benefit alternative with the patient and patient's wife.  They are here today to discuss bronchoscopy and neck steps. ? ?We discussed this today in detail risk benefits and alternatives and they were agreeable to proceed.  Unfortunately over the past few weeks patient has had increased use of his albuterol as well as shortness of breath and wheezing.  On exam today is also wheezing. ? ?OV 11/30/2021:Patient completed treatments with SBRT to lesion within the lung.  After bronchoscopy with tissue diagnosis and fiducial placement.  Patient had consultation initially with Dr. Lisbeth Renshaw and radiation oncology on 10/20/2021 followed by subsequent SBRT treatments.  Patient with increasing shortness of breath over the past week.  Nocturnal wheezing.  Difficulty breathing at nighttime.  Using his albuterol every couple of hours.  Also using his albuterol nebulizer.  Still using his Wixela plus Spiriva from the [place]. ? ?01/20/2022: here today for follow up regarding copd. Unable to get MRI. CT head with brain mass, concerning for meningioma. Saw [facility] today. Plans  for removal of a cardiac device so he can get the brain mri at somepoint. [facility]  From respiratory standpoint he is able to do most of his activities of daily living.  He does walk slower walks with a cane.  He does have some shortness of breath.  His wife is concerned but does feel like he has been stable.  He has done well with the transition to [place].  And would like to stay on it if possible. ? ? ? ?Past Medical History:  ?Diagnosis Date  ? Anxiety   ? Arthritis   ? Asthma   ? Atherosclerosis 01/16/2015  ? Noted on CXR  ? Bilateral lower extremity edema 01/16/2015  ? Chronic kidney disease (CKD), stage II (mild)   ? COPD (chronic obstructive pulmonary disease) ([place])   ? Dementia ([place])   ? Depression   ? Dyspnea   ? GERD (gastroesophageal reflux disease)   ? Gout   ? Hepatitis 12/2019  ? Hx Hep C positive in blood. Treated.  ? History of colon polyps   ? Hoarseness   ? Hyperlipidemia   ? Hypertension   ? Lung cancer ([place]) 09/07/2021  ? Neck abscess 09/03/2016  ? Pre-diabetes   ? Pulmonary emphysema ([place])   ? Seizure ([place])   ? Stroke [facility]) 2014  ? Thyroid disease   ? TIA (transient ischemic attack)   ? Urinary frequency   ? Urine retention   ? UTI (urinary tract infection)   ?  ? ?Family History  ?Problem Relation Age of Onset  ? Liver cancer Neg Hx   ?  Liver disease Neg Hx   ?  ? ?Past Surgical History:  ?Procedure Laterality Date  ? BRONCHIAL BIOPSY  09/07/2021  ? Procedure: BRONCHIAL BIOPSIES;  Surgeon: Garner Nash, DO;  Location: [facility];  Service: Pulmonary;;  ? BRONCHIAL BRUSHINGS  09/07/2021  ? Procedure: BRONCHIAL BRUSHINGS;  Surgeon: Garner Nash, DO;  Location: [facility];  Service: Pulmonary;;  ? BRONCHIAL NEEDLE ASPIRATION BIOPSY  09/07/2021  ? Procedure: BRONCHIAL NEEDLE ASPIRATION BIOPSIES;  Surgeon: Garner Nash, DO;  Location: [facility];  Service: Pulmonary;;  ? COLONOSCOPY  08/29/2018  ? CYSTOSCOPY WITH BIOPSY N/A 11/20/2018  ?  Procedure: CYSTOSCOPY WITH BLADDER BIOPSY;  Surgeon: Ceasar Mons, MD;  Location: [facility];  Service: Urology;  Laterality: N/A;  ? FIDUCIAL MARKER PLACEMENT  09/07/2021  ? Procedure: FIDUCIAL MARKER PLACEMENT;  Surgeon: Garner Nash, DO;  Location: [facility];  Service: Pulmonary;;  ? HERNIA REPAIR    ? t monitor    ? TRANSURETHRAL RESECTION OF PROSTATE N/A 11/20/2018  ? Procedure: TRANSURETHRAL RESECTION OF THE PROSTATE (TURP);  Surgeon: Ceasar Mons, MD;  Location[facility];  Service: Urology;  Laterality: N/A;  ? VIDEO BRONCHOSCOPY WITH ENDOBRONCHIAL NAVIGATION Bilateral 09/07/2021  ? Procedure: VIDEO BRONCHOSCOPY WITH ENDOBRONCHIAL NAVIGATION;  Surgeon: Garner Nash, DO;  Location: [facility];  Service: Pulmonary;  Laterality: Bilateral;  ION, with fiducial placement  ? ? ?Social History  ? ?Socioeconomic History  ? Marital status: Married  ?  Spouse name: Not on file  ? Number of children: Not on file  ? Years of education: Not on file  ? Highest education level: Not on file  ?Occupational History  ? Not on file  ?Tobacco Use  ? Smoking status: Former  ?  Types: E-cigarettes  ?  Quit date: 01/2014  ?  Years since quitting: 8.0  ? Smokeless tobacco: Never  ?Vaping Use  ? Vaping Use: Never used  ?Substance and Sexual Activity  ? Alcohol use: No  ? Drug use: No  ? Sexual activity: Not on file  ?Other Topics Concern  ? Not on file  ?Social History Narrative  ? Not on file  ? ?Social Determinants of Health  ? ?Financial Resource Strain: Not on file  ?Food Insecurity: Not on file  ?Transportation Needs: Not on file  ?Physical Activity: Not on file  ?Stress: Not on file  ?Social Connections: Not on file  ?Intimate Partner Violence: Not on file  ?  ? ?Allergies  ?Allergen Reactions  ? Bee Venom Anaphylaxis  ?  Face gets red and hard to breathe ?  ?  ? ?Outpatient Medications Prior to Visit  ?Medication Sig Dispense Refill  ? albuterol (PROVENTIL)  (2.5 MG/3ML) 0.083% nebulizer solution Take 3 mLs (2.5 mg total) by nebulization every 6 (six) hours as needed for wheezing or shortness of breath. 75 mL 12  ? albuterol (VENTOLIN HFA) 108 (90 Base) MCG/ACT inhaler Inhale 1-2 puffs into the lungs every 6 (six) hours as needed for wheezing or shortness of breath. 1 each 0  ? allopurinol (ZYLOPRIM) 100 MG tablet Take 100 mg by mouth 2 (two) times daily.    ? atorvastatin (LIPITOR) 10 MG tablet Take 10 mg by mouth at bedtime.    ? azithromycin (ZITHROMAX) 250 MG tablet Take 1 tablet (250 mg total) by mouth daily. 6 tablet 0  ? Budeson-Glycopyrrol-Formoterol (BREZTRI AEROSPHERE) 160-9-4.8 MCG/ACT AERO Inhale 2 puffs into the lungs in the morning and at bedtime.  5.9 g 0  ? chlorthalidone (HYGROTON) 50 MG tablet Take 50 mg by mouth daily.    ? FLUoxetine (PROZAC) 20 MG capsule Take 20 mg by mouth daily.    ? fluticasone-salmeterol (WIXELA INHUB) 250-50 MCG/ACT AEPB Inhale 1 puff into the lungs in the morning and at bedtime. 60 each 5  ? Fluticasone-Umeclidin-Vilant (TRELEGY ELLIPTA) 100-62.5-25 MCG/ACT AEPB Inhale 1 puff into the lungs daily. 14 each 0  ? Fluticasone-Umeclidin-Vilant (TRELEGY ELLIPTA) 100-62.5-25 MCG/ACT AEPB Inhale 1 puff into the lungs daily. 60 each 5  ? HYDROcodone-acetaminophen (NORCO) 5-325 MG tablet Take 1 tablet by mouth every 4 (four) hours as needed for moderate pain. 15 tablet 0  ? omeprazole (PRILOSEC) 20 MG capsule Take 20 mg by mouth daily.    ? ondansetron (ZOFRAN-ODT) 4 MG disintegrating tablet Take 1 tablet (4 mg total) by mouth every 8 (eight) hours as needed for nausea. 10 tablet 0  ? predniSONE (DELTASONE) 10 MG tablet Take 4 tabs by mouth once daily x4 days, then 3 tabs x4 days, 2 tabs x4 days, 1 tab x4 days and stop. 40 tablet 0  ? tamsulosin (FLOMAX) 0.4 MG CAPS capsule Take 0.4 mg by mouth daily.    ? aspirin EC 81 MG tablet Take 81 mg by mouth daily. Swallow whole. (Patient not taking: Reported on 09/12/2021)    ? Tiotropium  Bromide Monohydrate (SPIRIVA RESPIMAT) 2.5 MCG/ACT AERS Inhale 2 puffs into the lungs daily. 12 g 2  ? ?No facility-administered medications prior to visit.  ? ? ?Review of Systems  ?Constitutional:  Negative for

## 2022-01-26 ENCOUNTER — Other Ambulatory Visit (HOSPITAL_COMMUNITY): Payer: Self-pay

## 2022-02-06 ENCOUNTER — Other Ambulatory Visit (HOSPITAL_COMMUNITY): Payer: Self-pay

## 2022-02-10 ENCOUNTER — Ambulatory Visit (HOSPITAL_COMMUNITY)
Admission: RE | Admit: 2022-02-10 | Discharge: 2022-02-10 | Disposition: A | Discharge status: Home / Self Care | Payer: No Typology Code available for payment source | Source: Ambulatory Visit | Attending: Radiation Oncology | Admitting: Radiation Oncology

## 2022-02-10 DIAGNOSIS — C3412 Malignant neoplasm of upper lobe, left bronchus or lung: Secondary | ICD-10-CM | POA: Diagnosis not present

## 2022-02-10 LAB — POCT I-STAT CREATININE: Creatinine, Ser: 1 mg/dL (ref 0.61–1.24)

## 2022-02-10 IMAGING — CT CT CHEST W/ CM
2 of 4 series · 15 of 36 positions shown, 18 images · IV contrast (OMNIPAQUE)
Comparison: PET CT [DATE]

CLINICAL DATA: Non-small cell lung cancer of the left upper lobe,
prior SBRT.

* Tracking Code: BO *
EXAM:
CT CHEST WITH CONTRAST
TECHNIQUE: Multidetector CT imaging of the chest was performed during
intravenous contrast administration.

[Series 2: axial st · axial · 0.76mm/px · z∈[+22,+310]mm · 12 of 170 slices shown, 15 images]
[im 13/170  mediastinal]
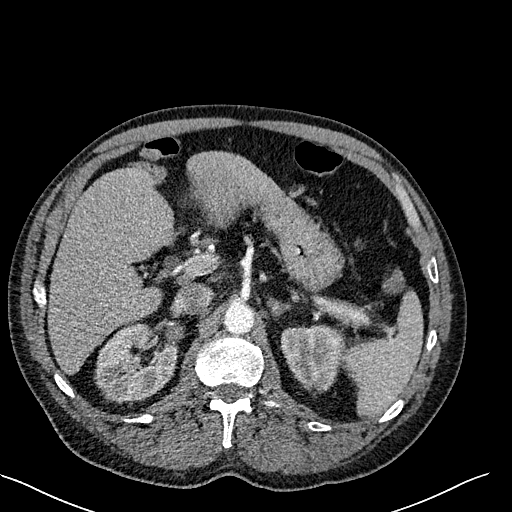
[im 13/170  lung]
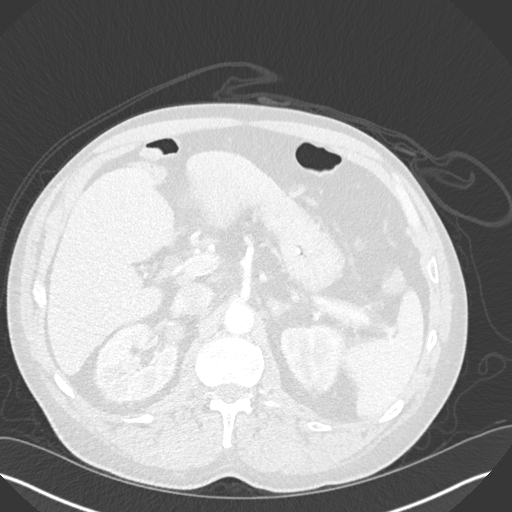
[im 25/170  lung]
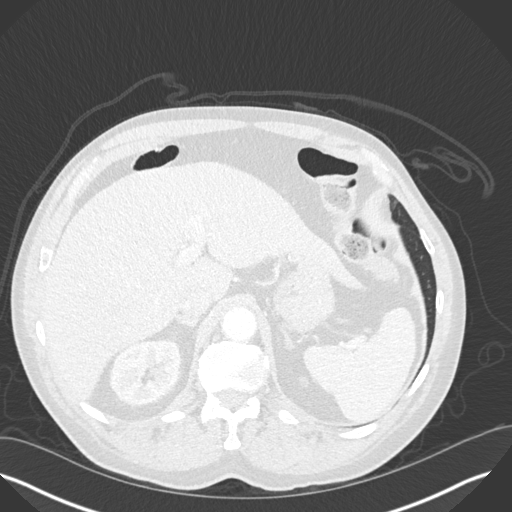
[im 37/170  lung]
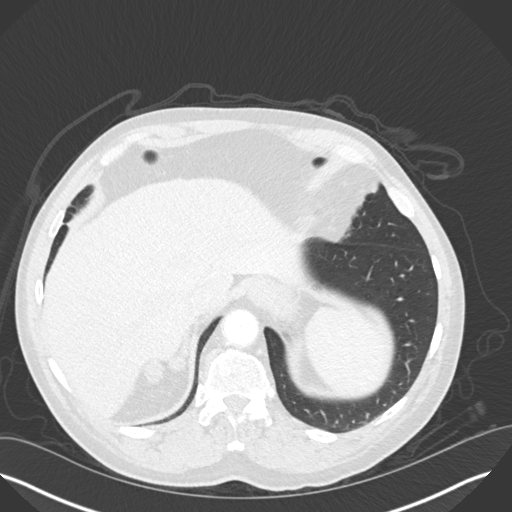
[im 49/170  lung]
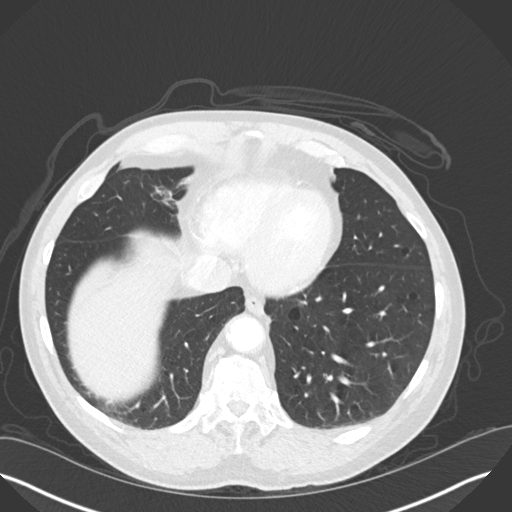
[im 61/170  mediastinal]
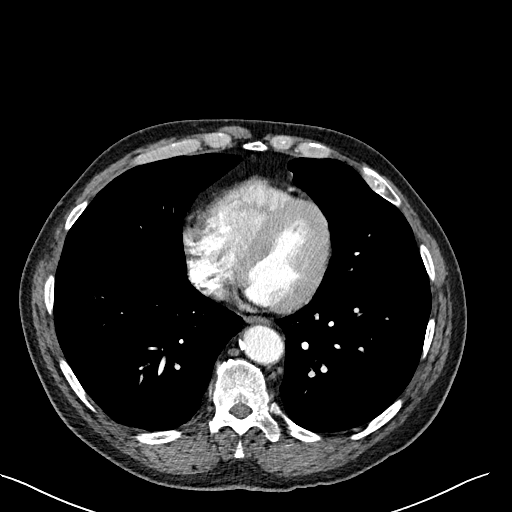
[im 61/170  lung]
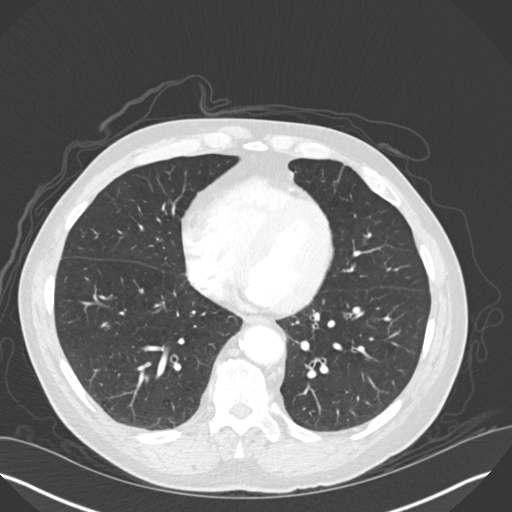
[im 73/170  lung]
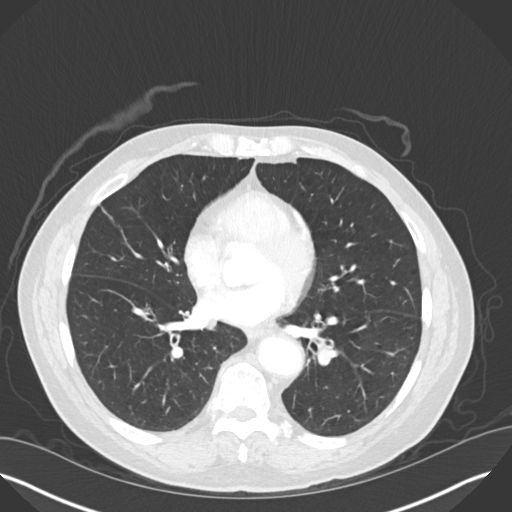
[im 97/170  lung]
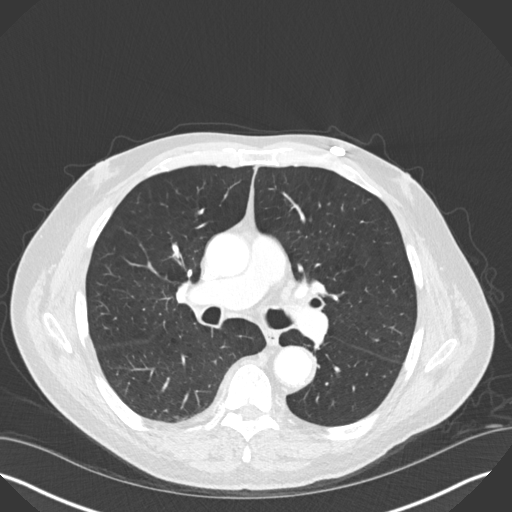
[im 109/170  lung]
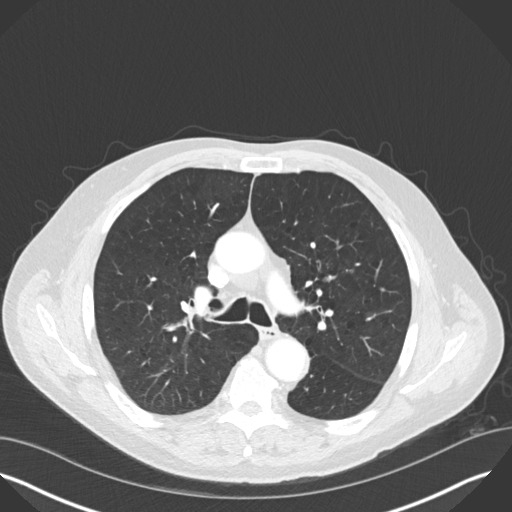
[im 121/170  mediastinal]
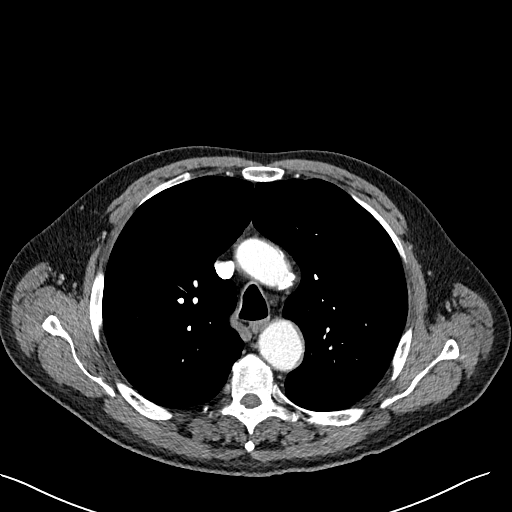
[im 121/170  lung]
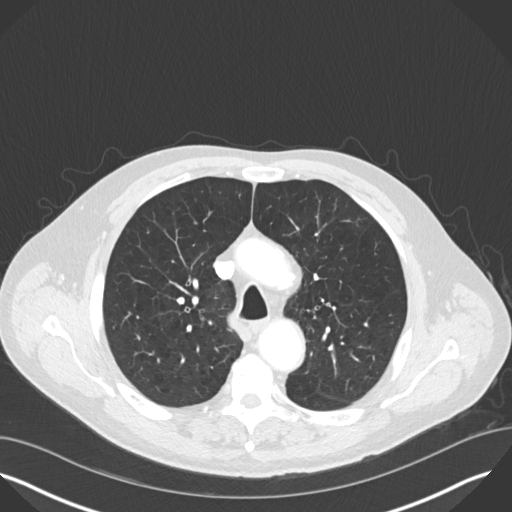
[im 133/170  lung]
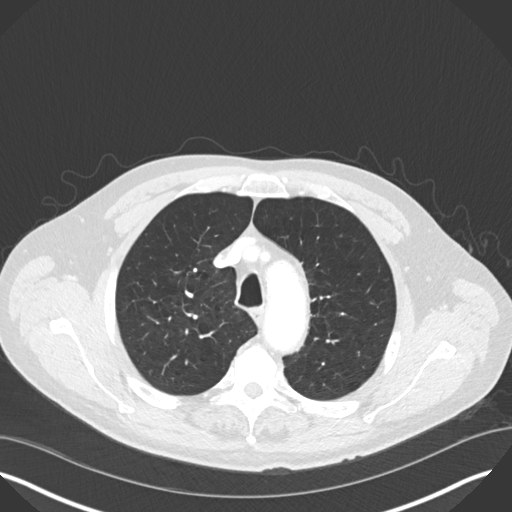
[im 145/170  lung]
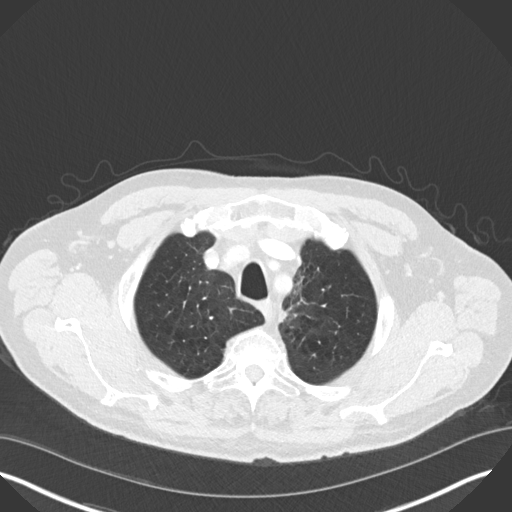
[im 157/170  lung]
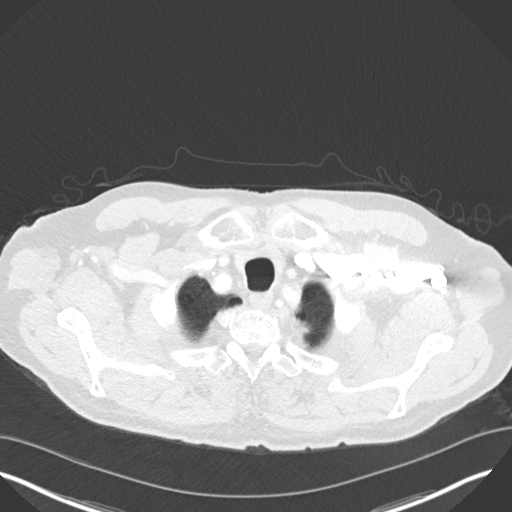

[Series 5: coronal · coronal · 0.84mm/px · 3 of 141 slices shown]
[im 29/141  lung]
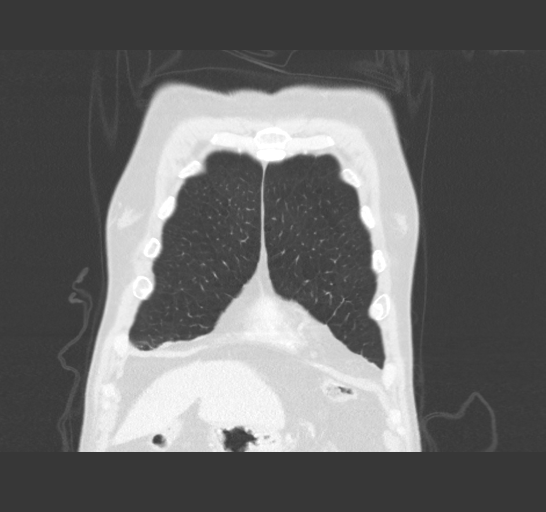
[im 57/141  lung]
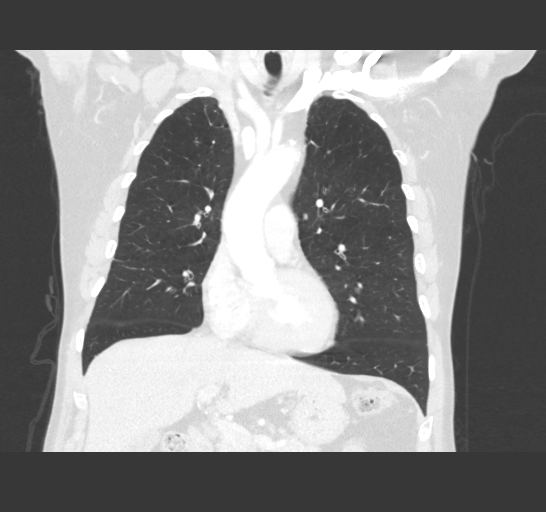
[im 85/141  lung]
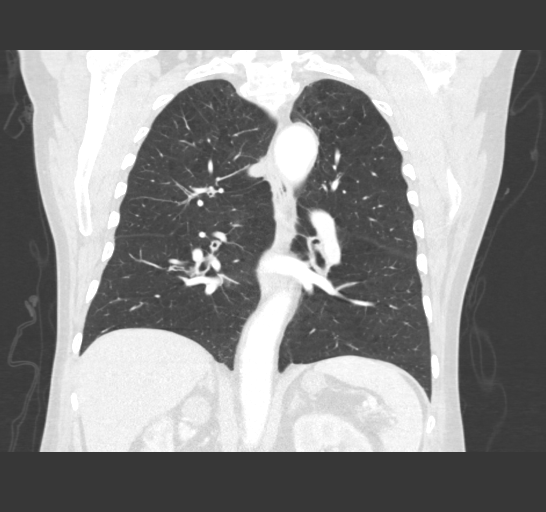

[15 of 36 positions shown; findings below may reference images not displayed]

RADIATION DOSE REDUCTION: This exam was performed according to the
departmental dose-optimization program which includes automated
exposure control, adjustment of the mA and/or kV according to
patient size and/or use of iterative reconstruction technique.

CONTRAST:  75mL OMNIPAQUE IOHEXOL 300 MG/ML  SOLN
FINDINGS: Cardiovascular: Coronary, aortic arch, and branch vessel
atherosclerotic vascular disease. Loop recorder noted.

Mediastinum/Nodes: Right infrahilar lymph node 0.9 cm in short axis
on image 89 series 2, not formerly hypermetabolic. This is upper
normal size and has measured similarly on prior exams including
[DATE].

Lungs/Pleura: The medial left upper lobe nodule measures 1.0 by
cm on image 21 series 7, formerly 1.7 by 0.9 cm on [DATE]. There
is adjacent interstitial accentuation and hazy ground-glass opacity
compatible with SBRT.

Stable 5 by 3 mm nodule in the left upper lobe medially on image 62
series 7, no change from [DATE], considered benign and requiring
no follow up.

Bilateral airway thickening. Mild scarring inferiorly in the right
middle lobe. Mild airway plugging in portions of the right lower
lobe. No new pulmonary nodule. Centrilobular emphysema.

Upper Abdomen: Accentuated density of right kidney upper pole
exophytic and partially exophytic lesions favoring complex cysts or
renal masses, not substantially changed from recent CT abdomen.

Musculoskeletal: Thoracic spondylosis.
IMPRESSION: 1. Substantial reduction in size of the treated left upper lobe
medial pulmonary nodule, currently 1.0 by 0.6 cm and previously
by 0.9 cm. Surrounding ground-glass and interstitial accentuation
compatible with SBRT.
2. Stable appearance of complex right kidney upper pole lesions,
potentially complex cysts and/or masses; a dedicated CT abdomen of
[DATE] revealed one lesion in the interpolar aspect of the right
kidney was indeterminate. Today's exam was not optimized to assess
the kidneys.
3. Aortic Atherosclerosis ([TO]-[TO]). Coronary atherosclerosis.
Loop recorder noted.
4.  Emphysema ([TO]-[TO]).
5.

## 2022-02-10 MED ORDER — IOHEXOL 300 MG/ML  SOLN
75.0000 mL | Freq: Once | INTRAMUSCULAR | Status: AC | PRN
  Administered 2022-02-10: 75 mL via INTRAVENOUS

## 2022-02-10 MED ORDER — SODIUM CHLORIDE (PF) 0.9 % IJ SOLN
INTRAMUSCULAR | Status: AC
  Filled 2022-02-10: qty 50

## 2022-02-13 ENCOUNTER — Other Ambulatory Visit (HOSPITAL_COMMUNITY): Payer: Self-pay | Admitting: Neurosurgery

## 2022-02-13 ENCOUNTER — Other Ambulatory Visit: Payer: Self-pay | Admitting: Neurosurgery

## 2022-02-13 ENCOUNTER — Telehealth: Payer: Self-pay | Admitting: Radiation Oncology

## 2022-02-13 DIAGNOSIS — G9389 Other specified disorders of brain: Secondary | ICD-10-CM

## 2022-02-13 DIAGNOSIS — C3412 Malignant neoplasm of upper lobe, left bronchus or lung: Secondary | ICD-10-CM

## 2022-02-13 NOTE — Telephone Encounter (Signed)
I called and reviewed the CT scan results post radiation with his wife which are very reassuring. I will plan another CT in 6 months time. He's been found to have what appears to be a meningioma as well and is seeing Dr. Duffy Rhody in neurosurgery. I let his wife know we'd be happy to weigh in if needed following Dr. Marcello Moores' recommendations if he had a high grade tumor or if this could not be resected. ? ? ? ? ?Carola Rhine, PAC  ?

## 2022-02-21 ENCOUNTER — Ambulatory Visit (HOSPITAL_COMMUNITY)
Admission: RE | Admit: 2022-02-21 | Discharge: 2022-02-21 | Disposition: A | Discharge status: Home / Self Care | Payer: No Typology Code available for payment source | Source: Ambulatory Visit | Attending: Neurosurgery | Admitting: Neurosurgery

## 2022-02-21 DIAGNOSIS — G9389 Other specified disorders of brain: Secondary | ICD-10-CM | POA: Diagnosis present

## 2022-02-21 IMAGING — MR MR HEAD WO/W CM
10 of 15 series · 32 of 48 positions shown · IV contrast (gadavist)
Comparison: [DATE] CT head,  no prior MRI

CLINICAL DATA: Non-small cell lung cancer, brain mass on CT

EXAM:
MRI HEAD WITHOUT AND WITH CONTRAST
TECHNIQUE: Multiplanar, multiecho pulse sequences of the brain and surrounding
structures were obtained without and with intravenous contrast.
CONTRAST:  8mL GADAVIST GADOBUTROL 1 MMOL/ML IV SOLN

[Series 5: DWI · axial · 3.0mm · 1.36mm/px · z∈[-45,+108]mm · 7 of 104 slices shown (1 of 2)]
[im 1/104]
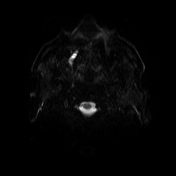
[im 18/104]
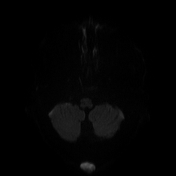
[im 35/104]
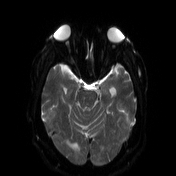
[im 52/104]
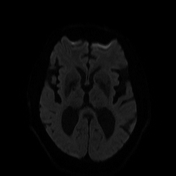
[im 69/104]
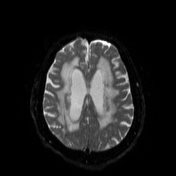
[im 86/104]
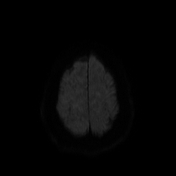
[im 104/104]
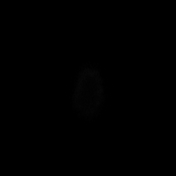

[Series 6: DWI · axial · 3.0mm · 1.36mm/px · z∈[-45,+108]mm · 3 of 52 slices shown (2 of 2)]
[im 1/52]
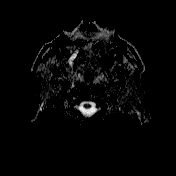
[im 26/52]
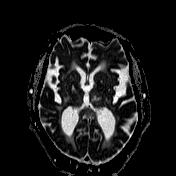
[im 52/52]
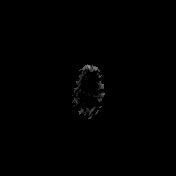

[Series 7: T1 · sagittal · 5.0mm · 0.75mm/px · 1 of 24 slices shown (1 of 2)]
[im 1/24]
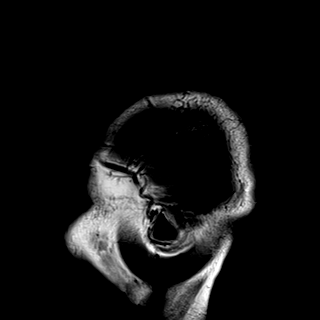

[Series 8: T2 · axial · 5.0mm · 0.65mm/px · 1 of 26 slices shown]
[im 1/26]
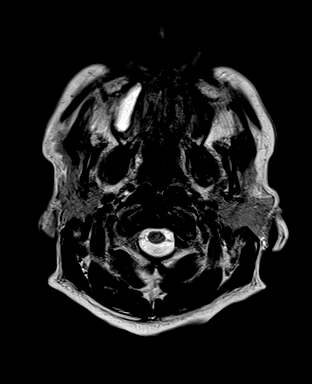

[Series 11: FLAIR · axial · 3.0mm · 0.47mm/px · z∈[-27,+126]mm · 3 of 52 slices shown]
[im 1/52]
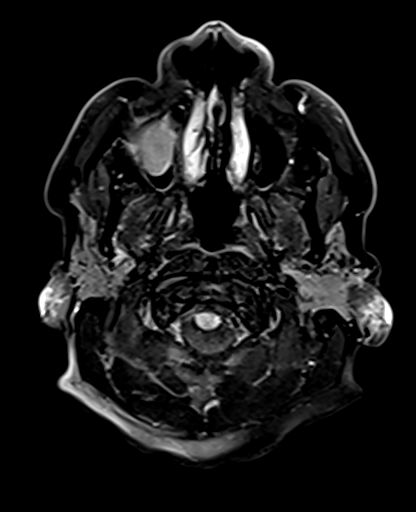
[im 26/52]
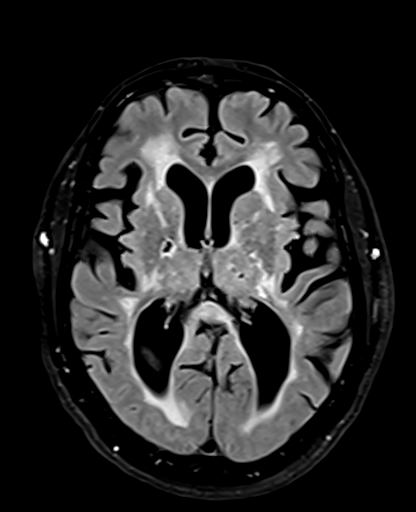
[im 52/52]
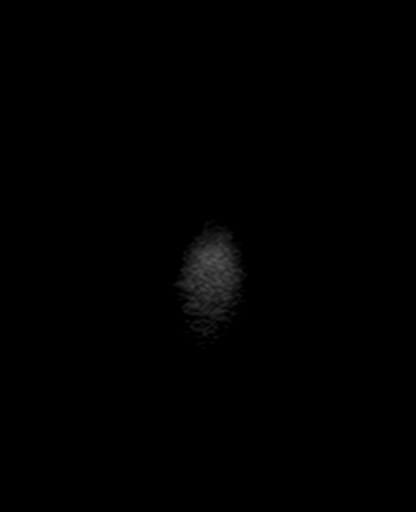

[Series 12: T1 · axial · 1.0mm · 0.47mm/px · z∈[-37,+24]mm · 4 of 144 slices shown (2 of 2)]
[im 1/144]
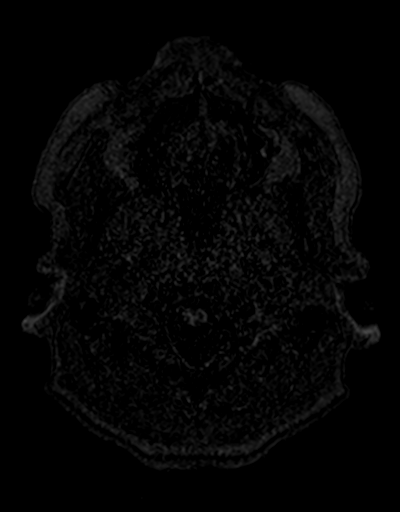
[im 21/144]
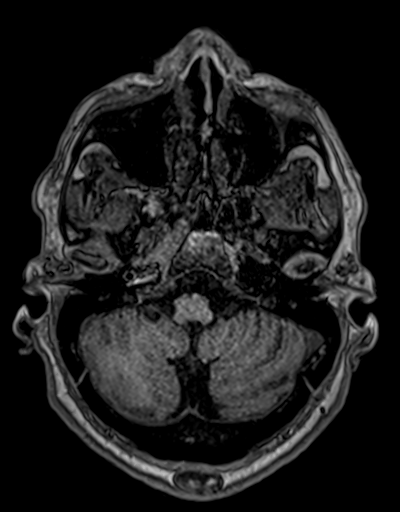
[im 41/144]
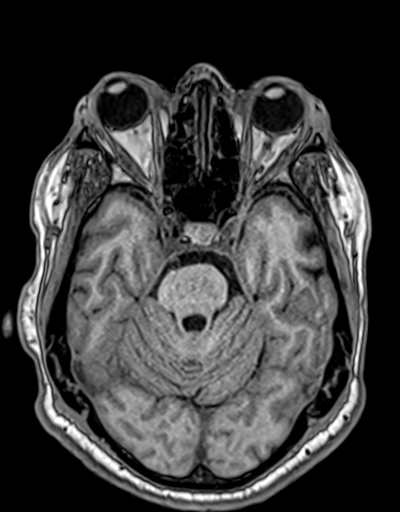
[im 62/144]
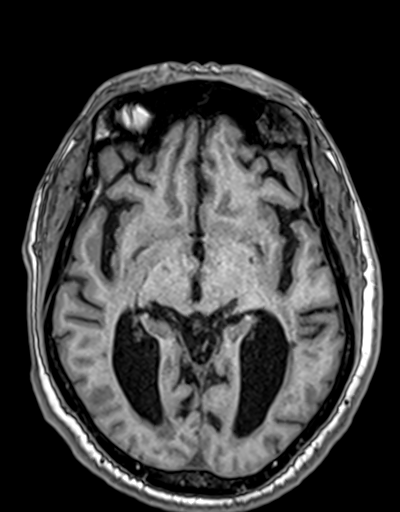

[Series 15: T2 post-contrast · coronal · 5.0mm · 0.69mm/px · 2 of 32 slices shown]
[im 1/32]
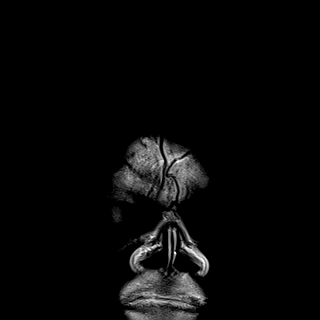
[im 32/32]
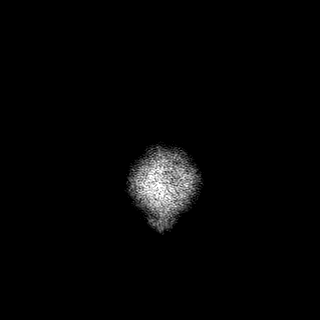

[Series 16: T1 post-contrast · axial · 1.0mm · 0.47mm/px · z∈[-37,+105]mm · 8 of 144 slices shown (1 of 3)]
[im 1/144]
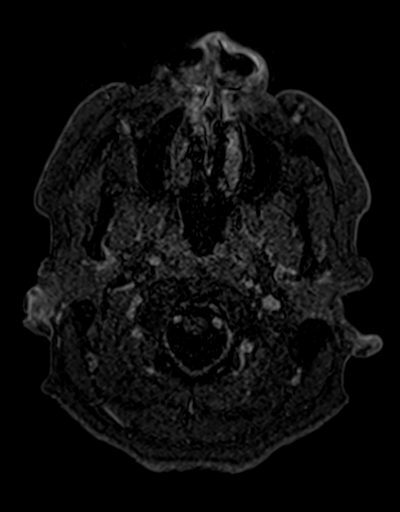
[im 21/144]
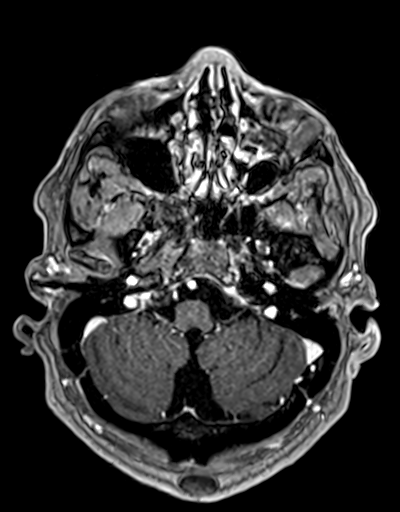
[im 41/144]
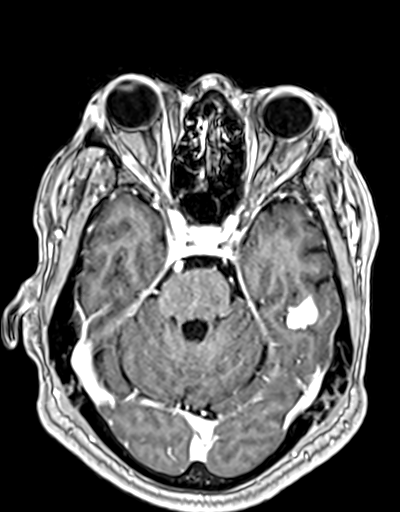
[im 62/144]
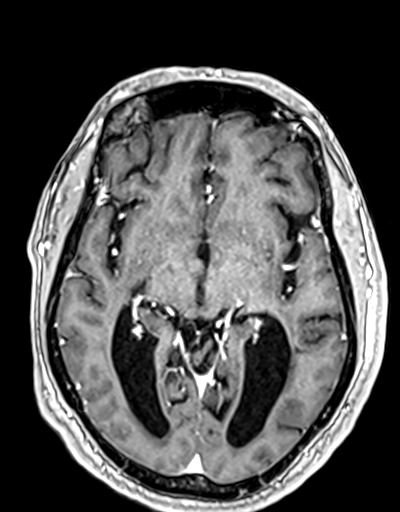
[im 82/144]
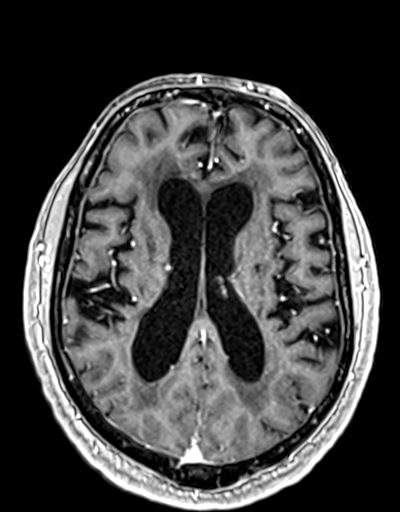
[im 103/144]
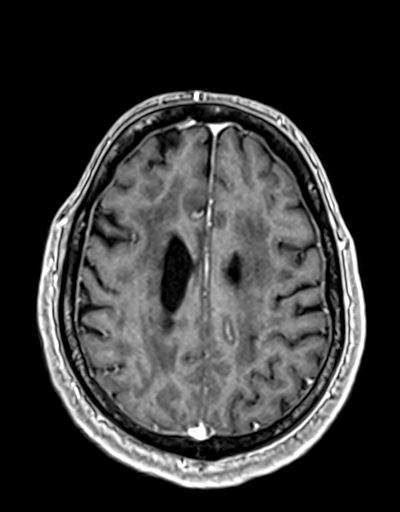
[im 123/144]
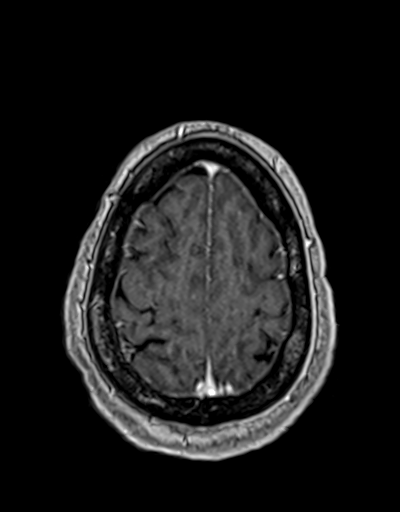
[im 144/144]
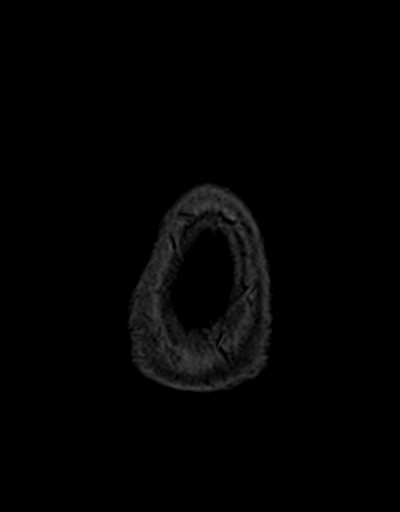

[Series 19: T1 post-contrast · coronal · 5.0mm · 0.43mm/px · 2 of 32 slices shown (2 of 3)]
[im 1/32]
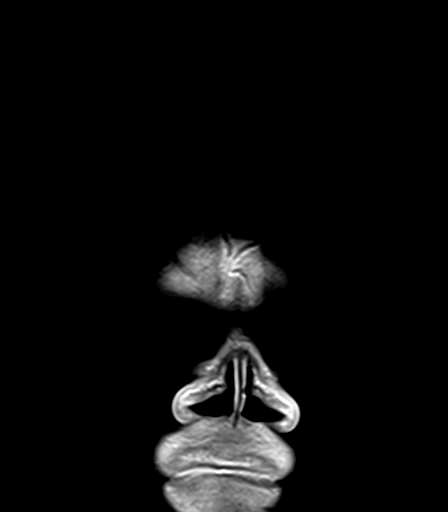
[im 32/32]
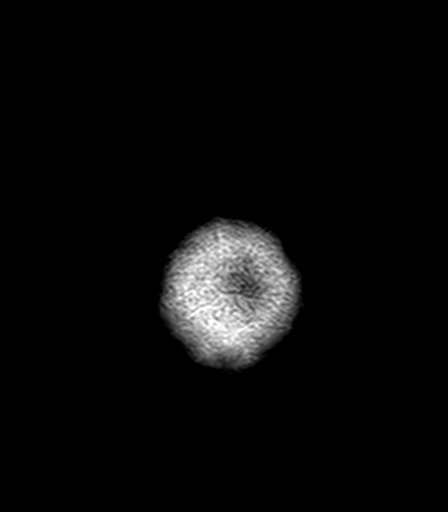

[Series 20: T1 post-contrast · sagittal · 5.0mm · 0.47mm/px · 1 of 24 slices shown (3 of 3)]
[im 1/24]
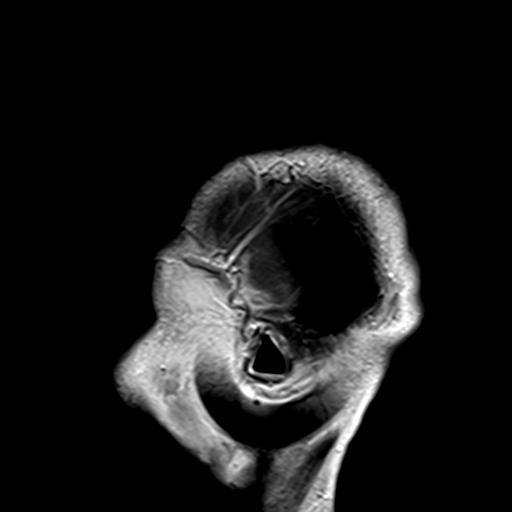

[32 of 48 positions shown; findings below may reference images not displayed]

FINDINGS: Brain: No restricted diffusion to suggest acute or subacute infarct.
No acute hemorrhage, midline shift, or extra-axial collection.

Lacunar infarcts in the bilateral basal ganglia, thalami, and pons.
Foci of hemosiderin deposition in the thalami, basal ganglia, pons,
and left occipital lobe are likely the sequela of hypertensive
microhemorrhages or associated with lacunar infarcts. Confluent T2
hyperintense signal in the periventricular white matter, likely the
sequela of severe chronic small vessel ischemic disease. Diffuse
cerebral volume loss, quite advanced for age, with commensurate size
of ventricles and sulci. No evidence of hydrocephalus.

Dural-based enhancing mass along the inferior aspect of the left
temporal lobe in the middle cranial fossa (, measuring up to 1.7 x
1.1 x 1.0 cm (AP x TR x CC) (series 16, image 40 and series 18,
image 23). No significantly increased T2 hyperintense signal in the
adjacent left temporal lobe. No abnormal parenchymal enhancement.

Vascular: Normal arterial flow voids and enhancement. Patent venous
sinuses.

Skull and upper cervical spine: T1 and T2 hyperintense lesion in the
left parietal calvarium, consistent with a benign hemangioma.
Otherwise normal marrow signal.

Sinuses/Orbits: Mucous retention cysts in the right maxillary sinus.
The orbits are unremarkable. The mastoids are well aerated.

Other: 2.2 cm T1 heterogeneous, T2 hyperintense, nonenhancing lesion
in the medial occipital scalp, most likely an inclusion cyst.
IMPRESSION: 1. Redemonstrated dural-based enhancing mass along the inferior left
temporal lobe, consistent with a meningioma.
2. No acute intracranial process.

## 2022-02-21 MED ORDER — GADOBUTROL 1 MMOL/ML IV SOLN
8.0000 mL | Freq: Once | INTRAVENOUS | Status: AC | PRN
  Administered 2022-02-21: 8 mL via INTRAVENOUS

## 2022-03-09 ENCOUNTER — Observation Stay (HOSPITAL_COMMUNITY)
Admission: EM | Admit: 2022-03-09 | Discharge: 2022-03-10 | Disposition: A | Discharge status: Home / Self Care | Payer: Medicare HMO | Attending: Internal Medicine | Admitting: Internal Medicine

## 2022-03-09 ENCOUNTER — Emergency Department (HOSPITAL_COMMUNITY): Payer: Medicare HMO

## 2022-03-09 ENCOUNTER — Encounter (HOSPITAL_COMMUNITY): Payer: Self-pay | Admitting: Emergency Medicine

## 2022-03-09 DIAGNOSIS — J4489 Other specified chronic obstructive pulmonary disease: Secondary | ICD-10-CM | POA: Diagnosis present

## 2022-03-09 DIAGNOSIS — R69 Illness, unspecified: Secondary | ICD-10-CM | POA: Diagnosis not present

## 2022-03-09 DIAGNOSIS — R29898 Other symptoms and signs involving the musculoskeletal system: Secondary | ICD-10-CM | POA: Insufficient documentation

## 2022-03-09 DIAGNOSIS — Z87891 Personal history of nicotine dependence: Secondary | ICD-10-CM | POA: Insufficient documentation

## 2022-03-09 DIAGNOSIS — L03116 Cellulitis of left lower limb: Principal | ICD-10-CM | POA: Diagnosis present

## 2022-03-09 DIAGNOSIS — J439 Emphysema, unspecified: Secondary | ICD-10-CM | POA: Diagnosis present

## 2022-03-09 DIAGNOSIS — J449 Chronic obstructive pulmonary disease, unspecified: Secondary | ICD-10-CM | POA: Diagnosis not present

## 2022-03-09 DIAGNOSIS — Z7982 Long term (current) use of aspirin: Secondary | ICD-10-CM | POA: Insufficient documentation

## 2022-03-09 DIAGNOSIS — R000 Tachycardia, unspecified: Secondary | ICD-10-CM | POA: Diagnosis not present

## 2022-03-09 DIAGNOSIS — J45909 Unspecified asthma, uncomplicated: Secondary | ICD-10-CM | POA: Diagnosis not present

## 2022-03-09 DIAGNOSIS — R262 Difficulty in walking, not elsewhere classified: Secondary | ICD-10-CM | POA: Diagnosis present

## 2022-03-09 DIAGNOSIS — N182 Chronic kidney disease, stage 2 (mild): Secondary | ICD-10-CM | POA: Diagnosis not present

## 2022-03-09 DIAGNOSIS — Z79899 Other long term (current) drug therapy: Secondary | ICD-10-CM | POA: Insufficient documentation

## 2022-03-09 DIAGNOSIS — R079 Chest pain, unspecified: Secondary | ICD-10-CM | POA: Diagnosis not present

## 2022-03-09 DIAGNOSIS — J441 Chronic obstructive pulmonary disease with (acute) exacerbation: Secondary | ICD-10-CM | POA: Diagnosis present

## 2022-03-09 DIAGNOSIS — Z85118 Personal history of other malignant neoplasm of bronchus and lung: Secondary | ICD-10-CM | POA: Insufficient documentation

## 2022-03-09 DIAGNOSIS — Z8673 Personal history of transient ischemic attack (TIA), and cerebral infarction without residual deficits: Secondary | ICD-10-CM

## 2022-03-09 DIAGNOSIS — R531 Weakness: Secondary | ICD-10-CM | POA: Diagnosis not present

## 2022-03-09 DIAGNOSIS — M79672 Pain in left foot: Secondary | ICD-10-CM | POA: Diagnosis not present

## 2022-03-09 LAB — BASIC METABOLIC PANEL
Anion gap: 10 (ref 5–15)
BUN: 20 mg/dL (ref 8–23)
CO2: 25 mmol/L (ref 22–32)
Calcium: 9.5 mg/dL (ref 8.9–10.3)
Chloride: 100 mmol/L (ref 98–111)
Creatinine, Ser: 1.26 mg/dL — ABNORMAL HIGH (ref 0.61–1.24)
GFR, Estimated: >60 mL/min (ref >60)
Glucose, Bld: 124 mg/dL — ABNORMAL HIGH (ref 70–99)
Potassium: 3.8 mmol/L (ref 3.5–5.1)
Sodium: 135 mmol/L (ref 135–145)

## 2022-03-09 LAB — HEPATIC FUNCTION PANEL
ALT: 10 U/L (ref 0–44)
AST: 12 U/L — ABNORMAL LOW (ref 15–41)
Albumin: 3.8 g/dL (ref 3.5–5.0)
Alkaline Phosphatase: 62 U/L (ref 38–126)
Bilirubin, Direct: 0.1 mg/dL (ref 0.0–0.2)
Indirect Bilirubin: 0.8 mg/dL (ref 0.3–0.9)
Total Bilirubin: 0.9 mg/dL (ref 0.3–1.2)
Total Protein: 7.7 g/dL (ref 6.5–8.1)

## 2022-03-09 LAB — CBG MONITORING, ED: Glucose-Capillary: 132 mg/dL — ABNORMAL HIGH (ref 70–99)

## 2022-03-09 LAB — CBC
HCT: 38.5 % — ABNORMAL LOW (ref 39.0–52.0)
Hemoglobin: 12.9 g/dL — ABNORMAL LOW (ref 13.0–17.0)
MCH: 30.9 pg (ref 26.0–34.0)
MCHC: 33.5 g/dL (ref 30.0–36.0)
MCV: 92.1 fL (ref 80.0–100.0)
Platelets: 280 10*3/uL (ref 150–400)
RBC: 4.18 MIL/uL — ABNORMAL LOW (ref 4.22–5.81)
RDW: 12.2 % (ref 11.5–15.5)
WBC: 11.3 10*3/uL — ABNORMAL HIGH (ref 4.0–10.5)
nRBC: 0 % (ref 0.0–0.2)

## 2022-03-09 LAB — TROPONIN I (HIGH SENSITIVITY)
Troponin I (High Sensitivity): 6 ng/L (ref <18)
Troponin I (High Sensitivity): 8 ng/L (ref <18)

## 2022-03-09 LAB — ETHANOL: Alcohol, Ethyl (B): <10 mg/dL (ref <10)

## 2022-03-09 LAB — LACTIC ACID, PLASMA: Lactic Acid, Venous: 0.8 mmol/L (ref 0.5–1.9)

## 2022-03-09 LAB — AMMONIA: Ammonia: 19 umol/L (ref 9–35)

## 2022-03-09 IMAGING — CR DG CHEST 2V
4 series · 4 of 4 positions shown · non-contrast
Comparison: Chest x-ray [DATE].

CLINICAL DATA: Chest pain.

EXAM:
CHEST - 2 VIEW

[chest lat (1 of 2)]
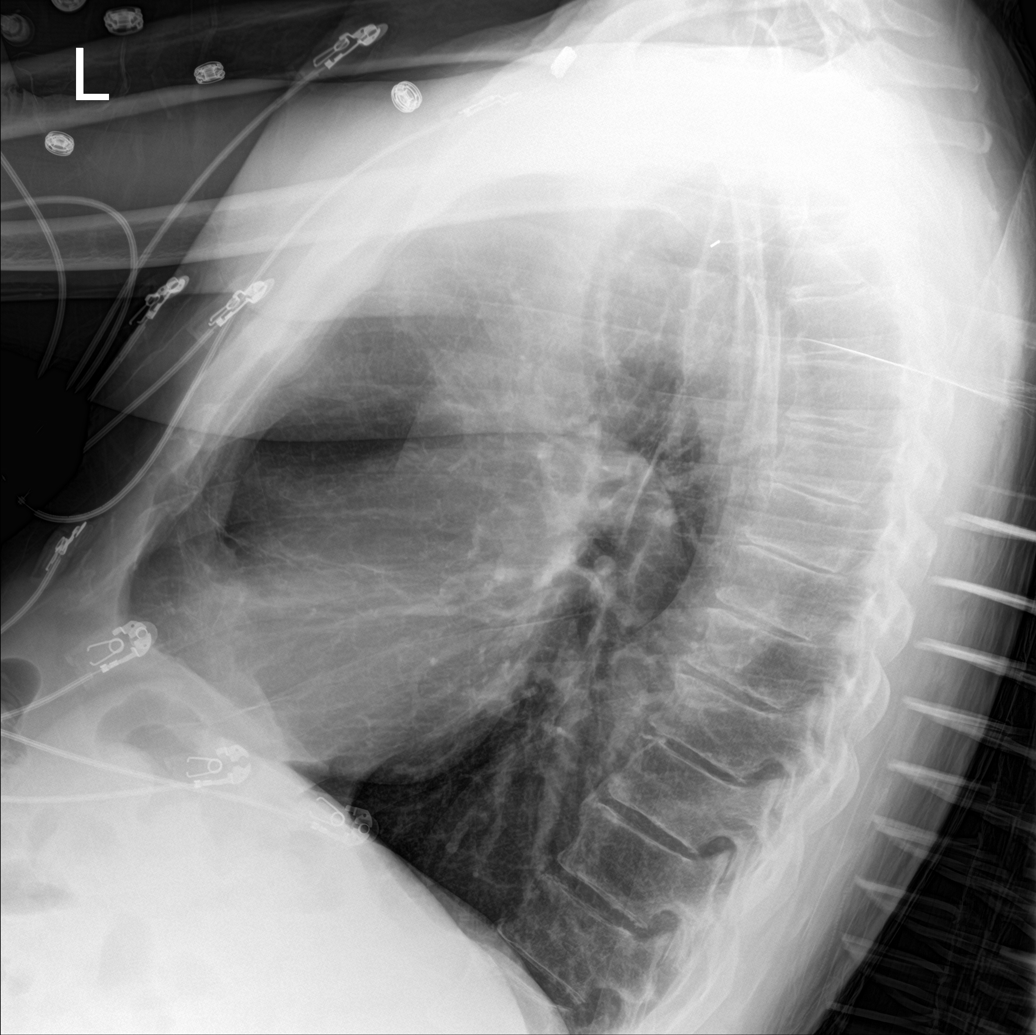

[chest ap (1 of 2)]
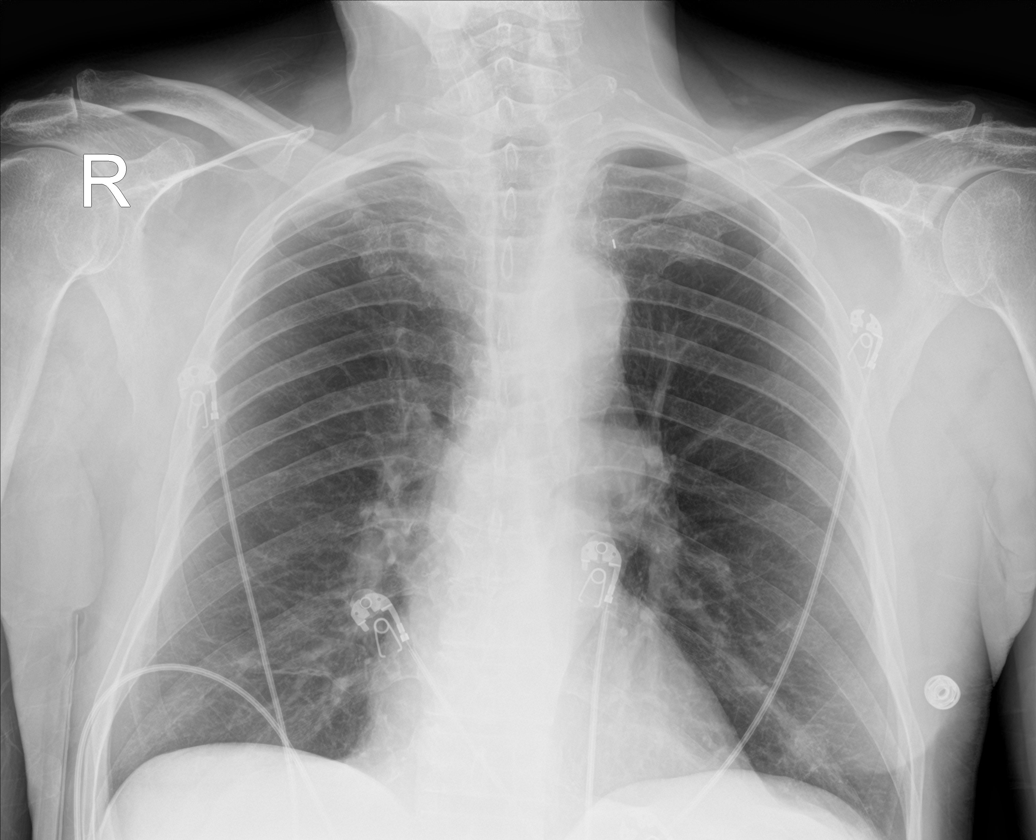

[chest lat (2 of 2)]
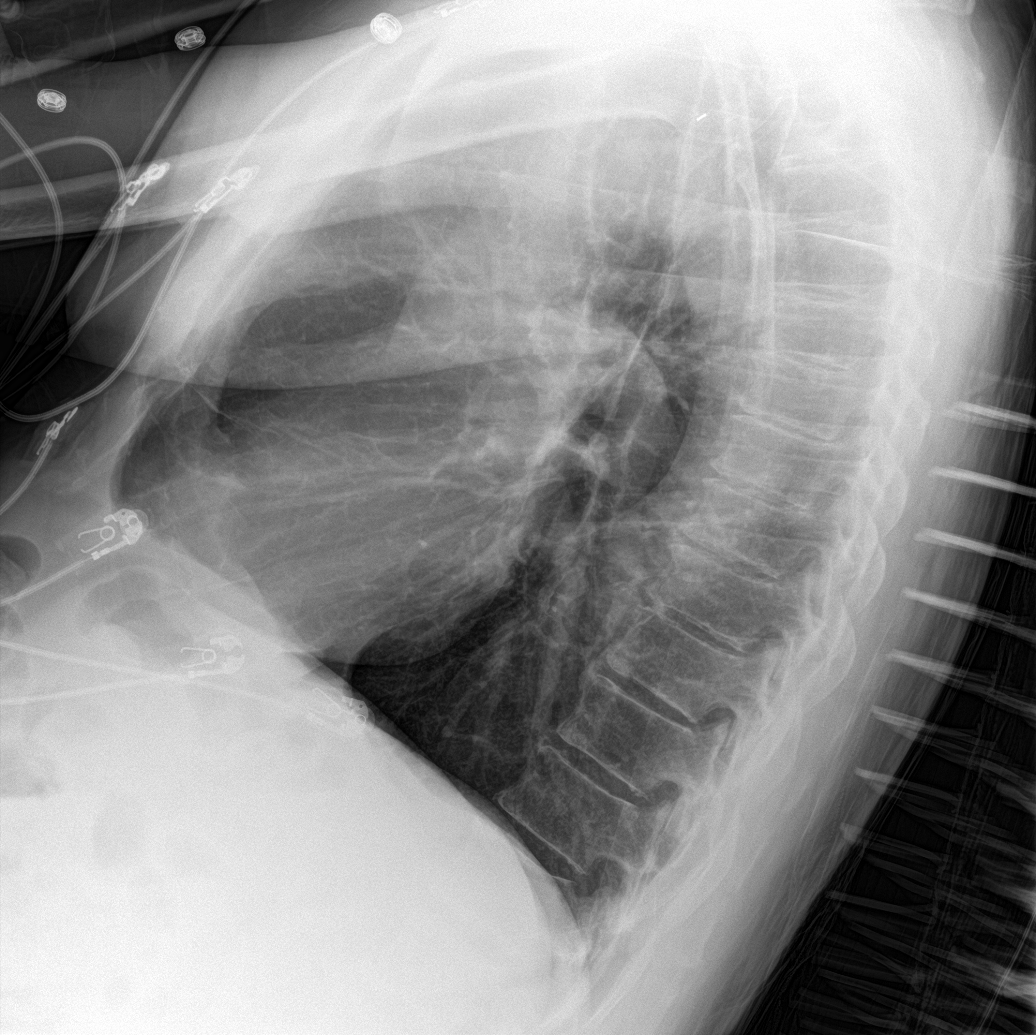

[chest ap (2 of 2)]
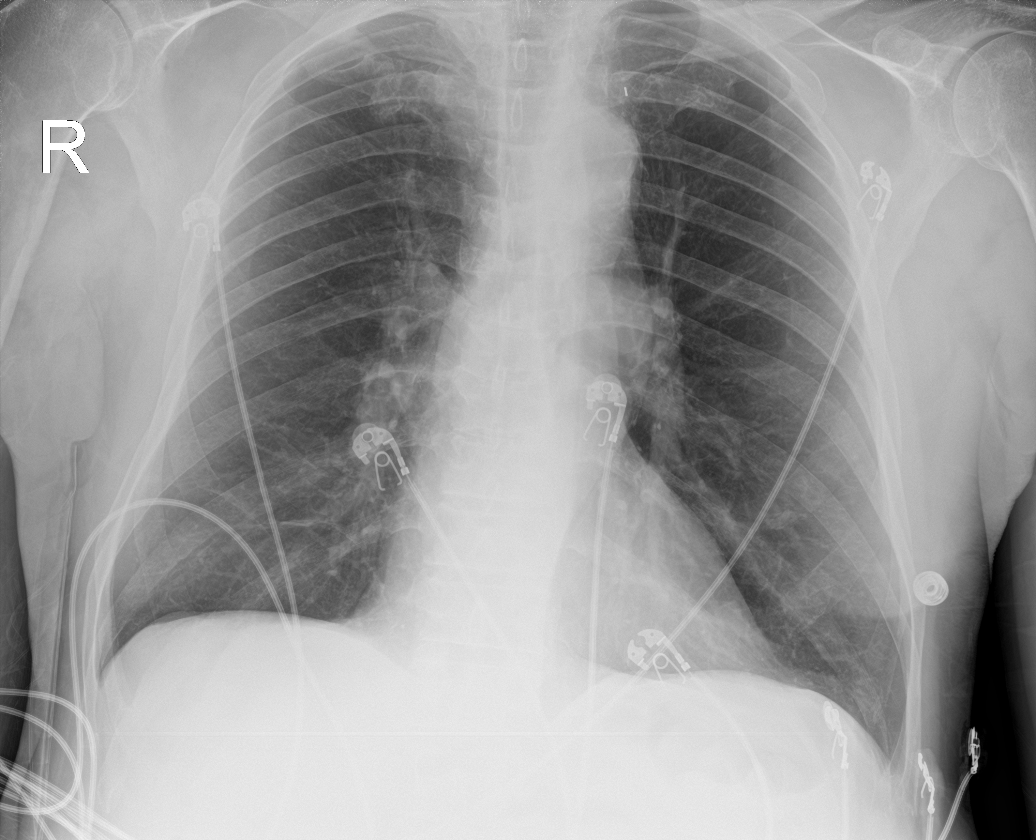

[4 of 4 positions shown; findings below may reference images not displayed]

FINDINGS: The heart size and mediastinal contours are within normal limits.
Single surgical clip overlies the left upper lobe, unchanged. Both
lungs are clear. The visualized skeletal structures are
unremarkable.
IMPRESSION: No active cardiopulmonary disease.

## 2022-03-09 IMAGING — DX DG FOOT COMPLETE 3+V*L*
3 series · 3 of 3 positions shown · non-contrast
Comparison: None Available.

CLINICAL DATA: Foot pain

EXAM:
LEFT FOOT - COMPLETE 3+ VIEW

[foot ap]
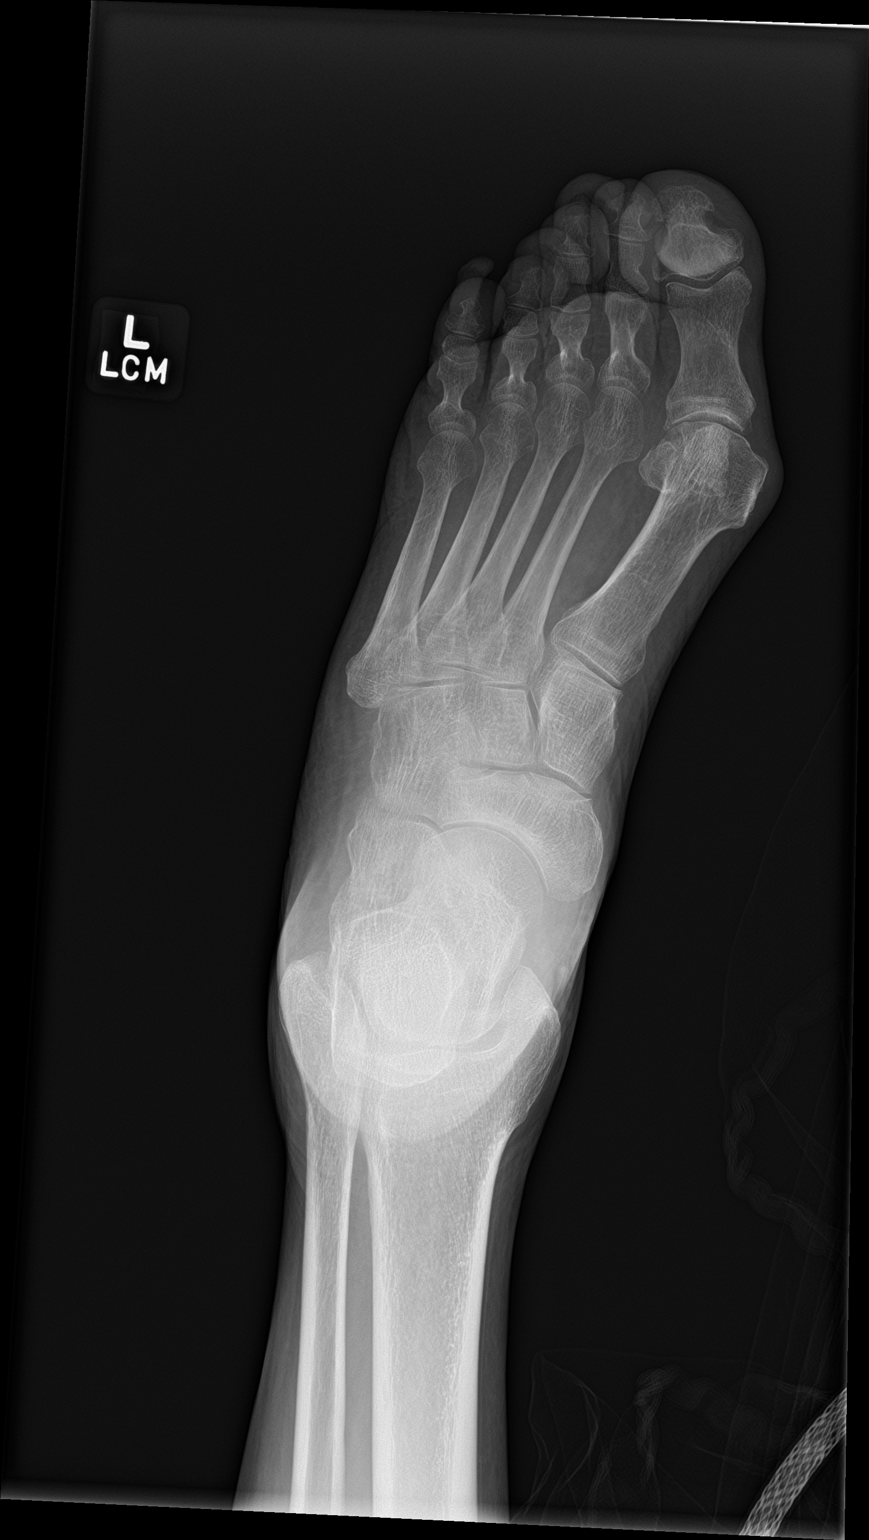

[foot obl]
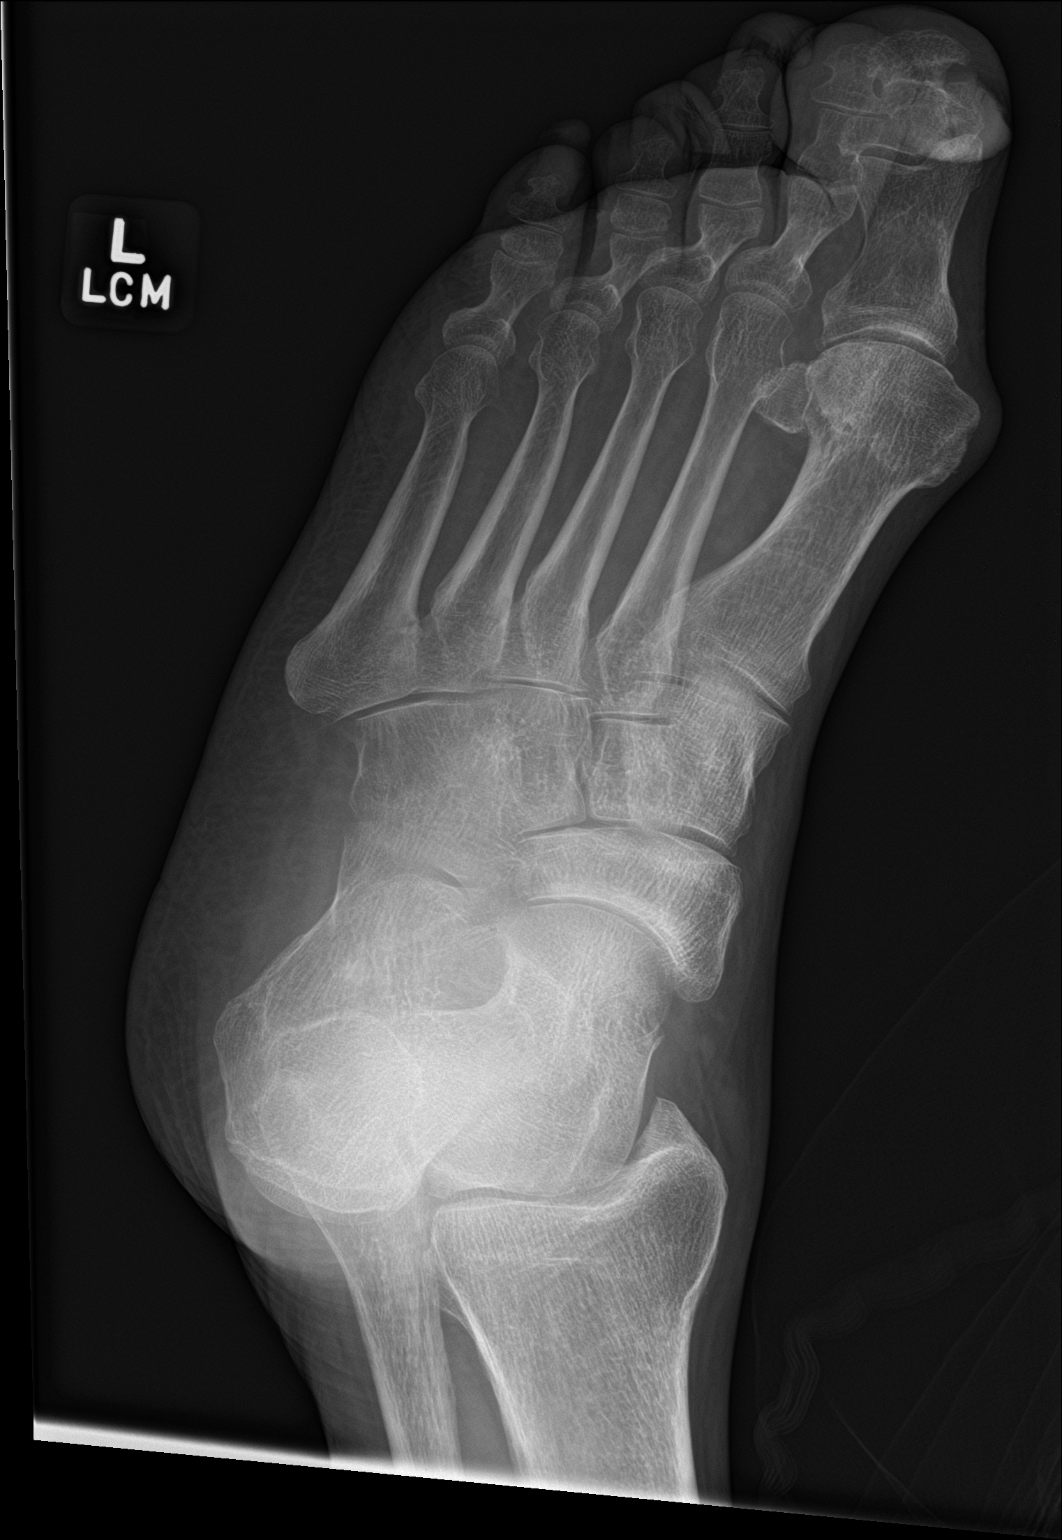

[foot lat]
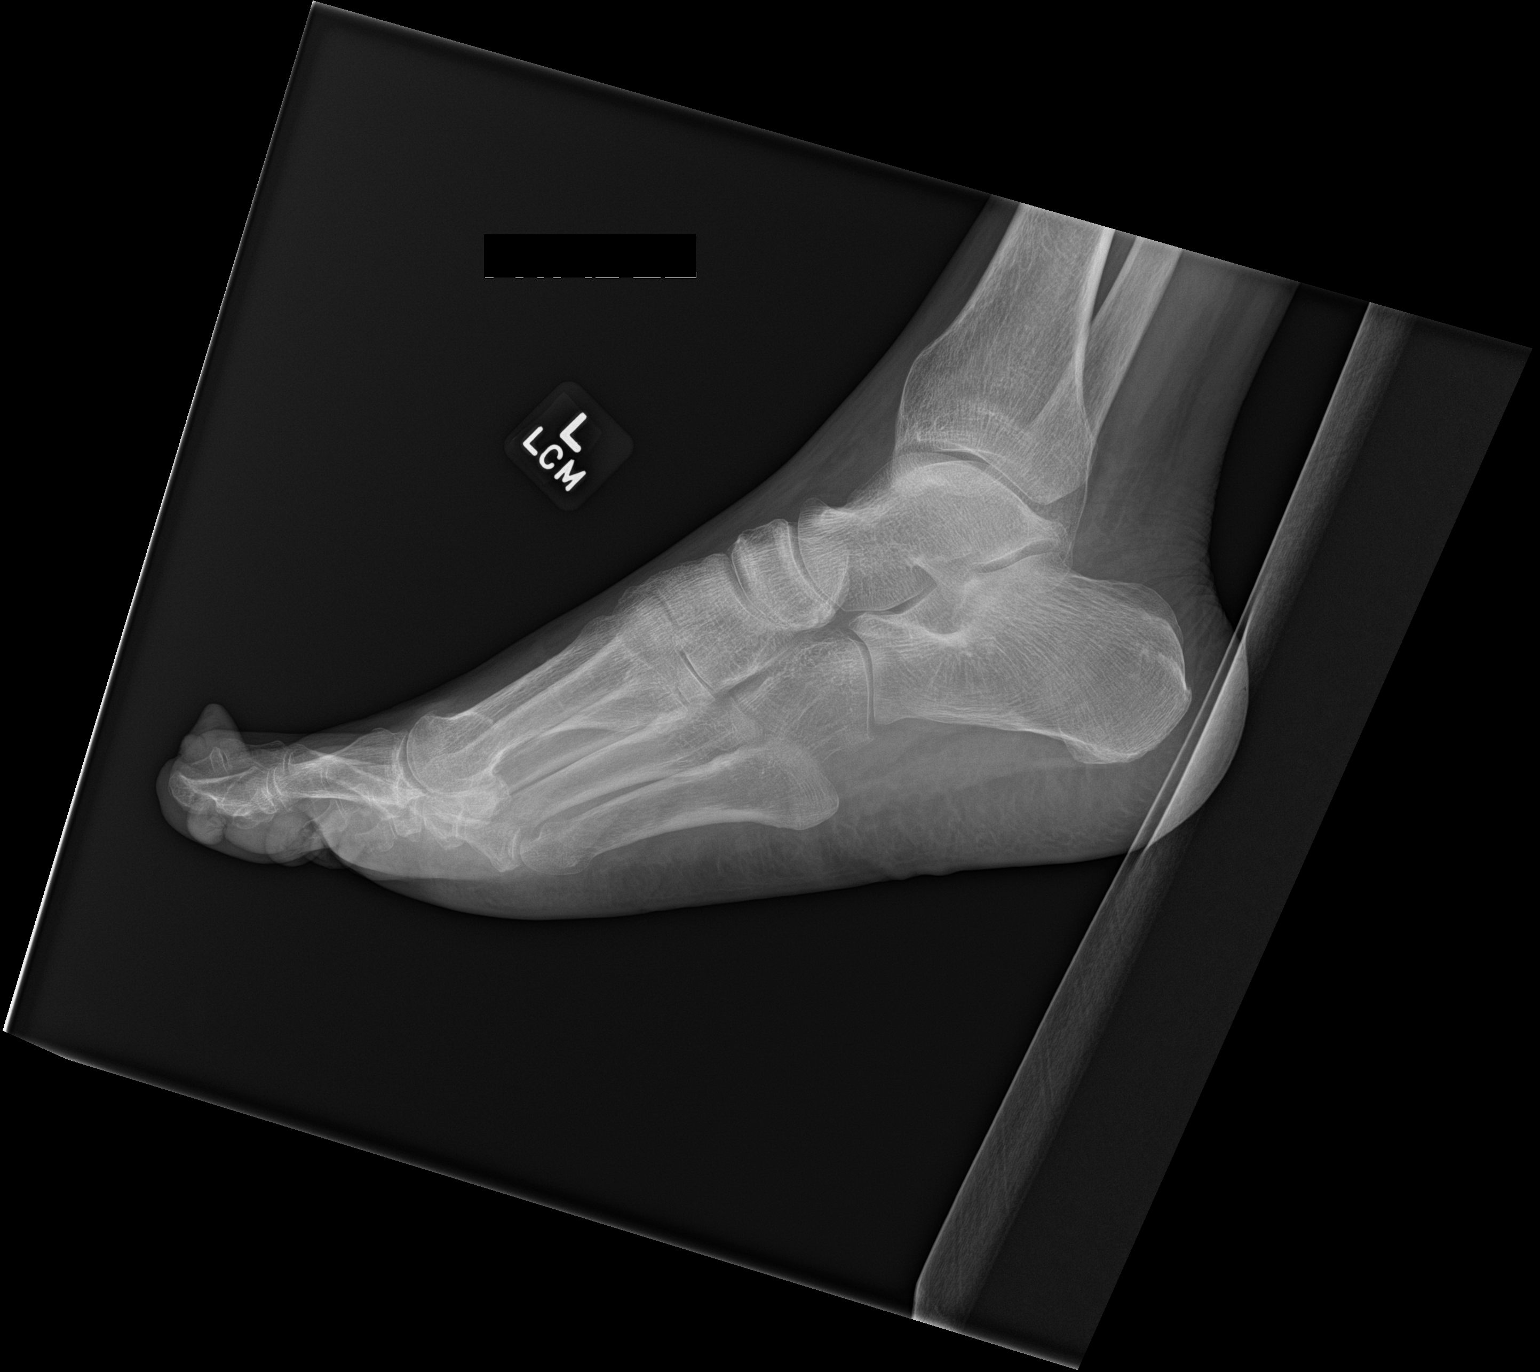

[3 of 3 positions shown; findings below may reference images not displayed]

FINDINGS: No fracture or malalignment. Moderate arthritis at the first MTP
joint. Small bunion at the head of first metatarsal. Soft tissues
are unremarkable.
IMPRESSION: No acute osseous abnormality

## 2022-03-09 IMAGING — CT CT HEAD W/O CM
3 series · 15 of 47 positions shown, 18 images · non-contrast
Comparison: MRI [DATE], CT [DATE]

CLINICAL DATA: Weakness



[Series 3: head 5.0 h30s · axial · 0.42mm/px · z∈[-109,+36]mm · 9 of 35 slices shown, 12 images]
[im 3/35  brain]
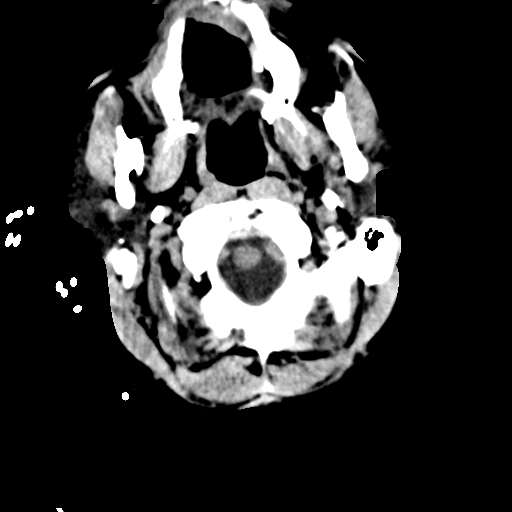
[im 3/35  bone]
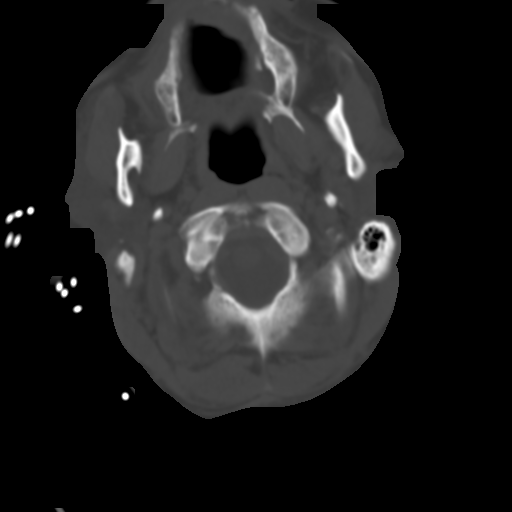
[im 6/35  brain]
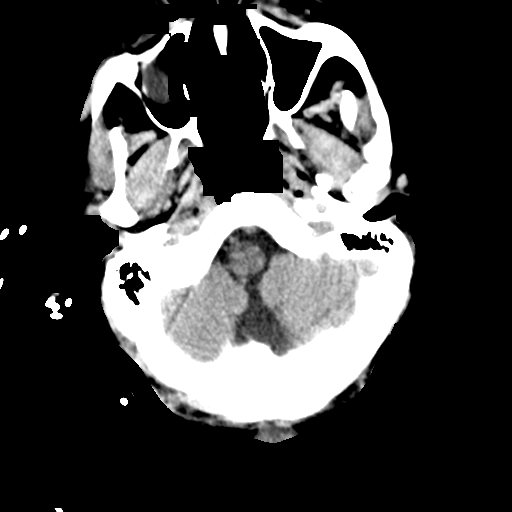
[im 10/35  brain]
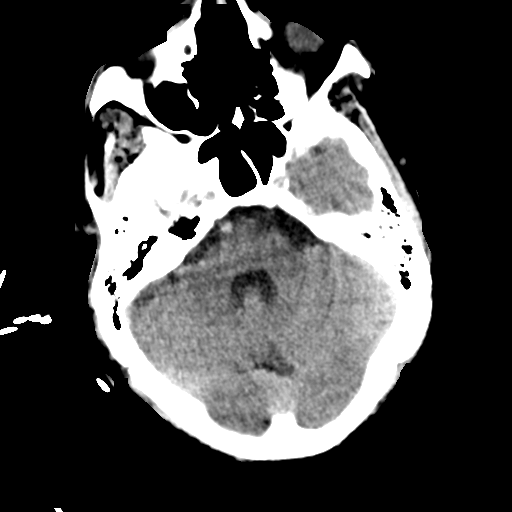
[im 13/35  brain]
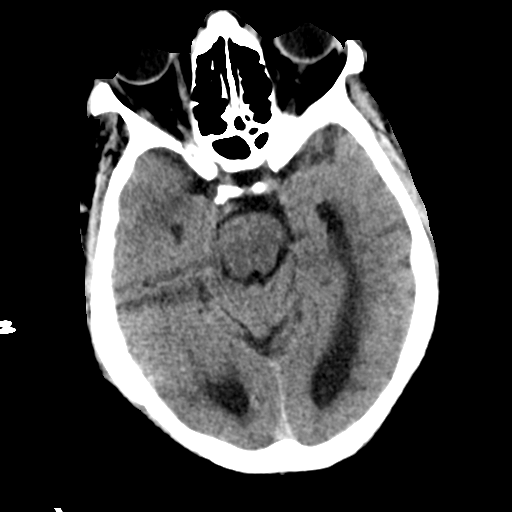
[im 18/35  brain]
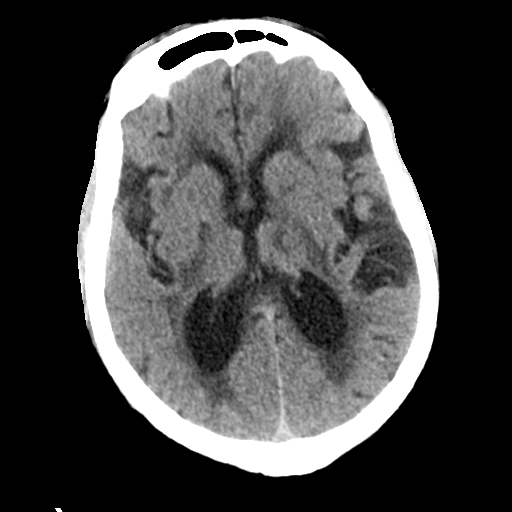
[im 18/35  bone]
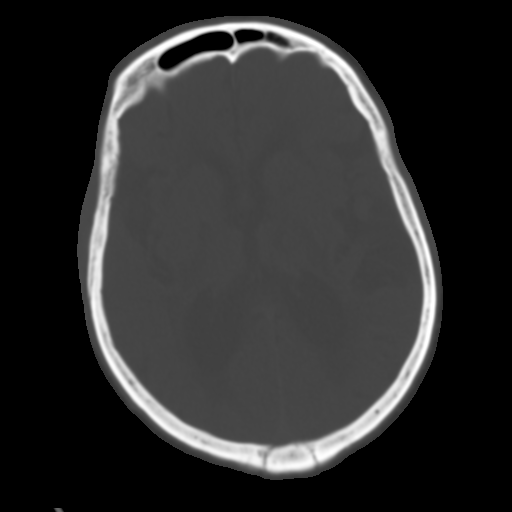
[im 22/35  brain]
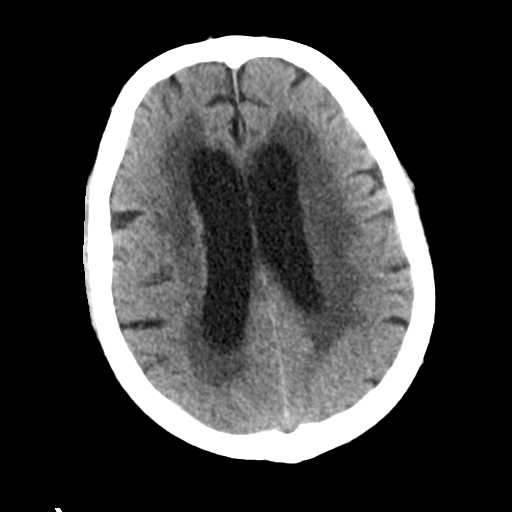
[im 25/35  brain]
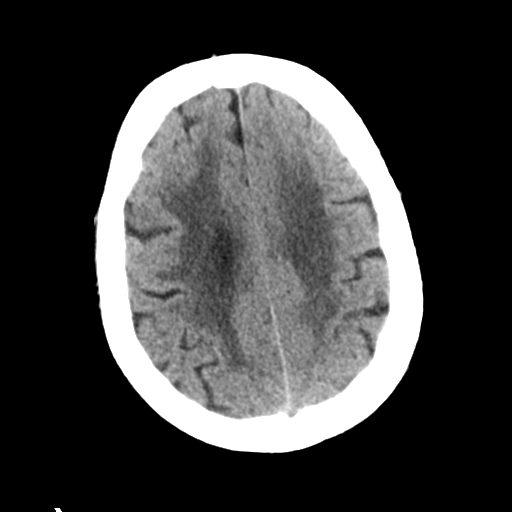
[im 29/35  brain]
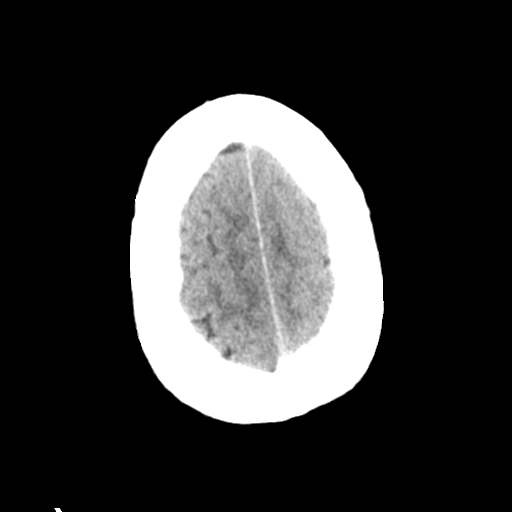
[im 32/35  brain]
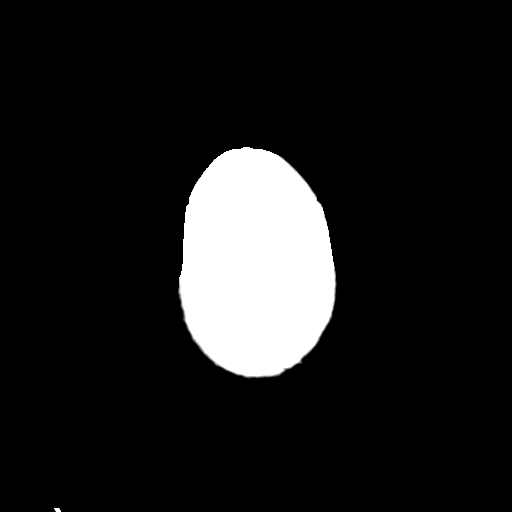
[im 32/35  bone]
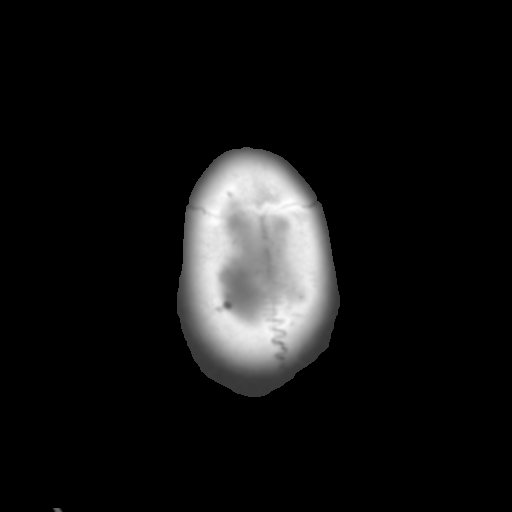

[Series 5: head 3.0 mpr cor · coronal · 0.33mm/px · 3 of 71 slices shown]
[im 24/71  brain]
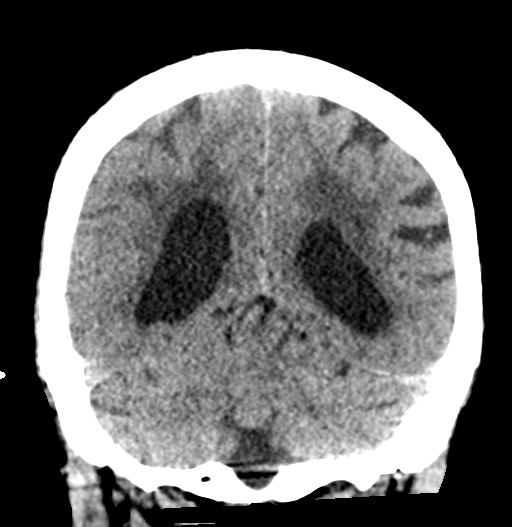
[im 32/71  brain]
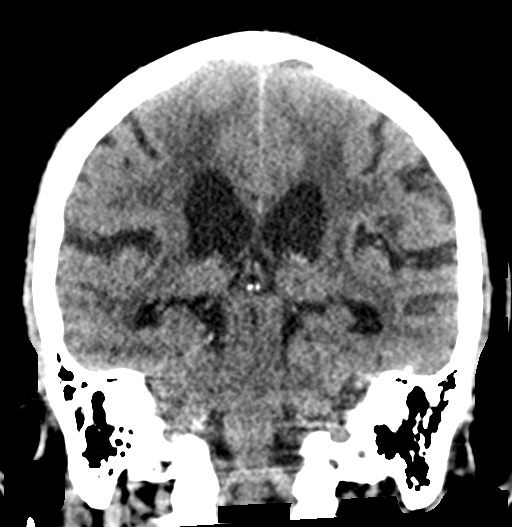
[im 39/71  brain]
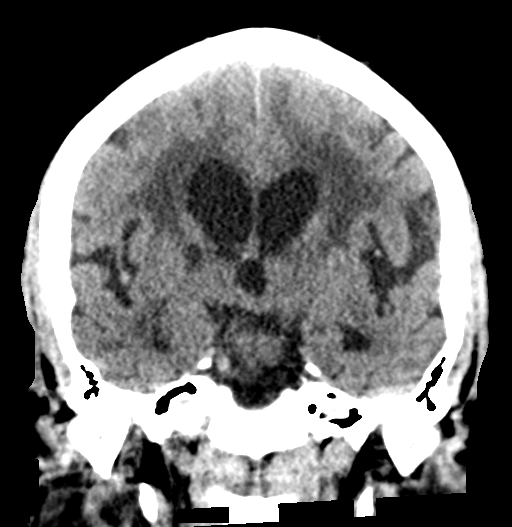

[Series 6: head 3.0 mpr sag · sagittal · 0.34mm/px · 3 of 59 slices shown]
[im 20/59  brain]
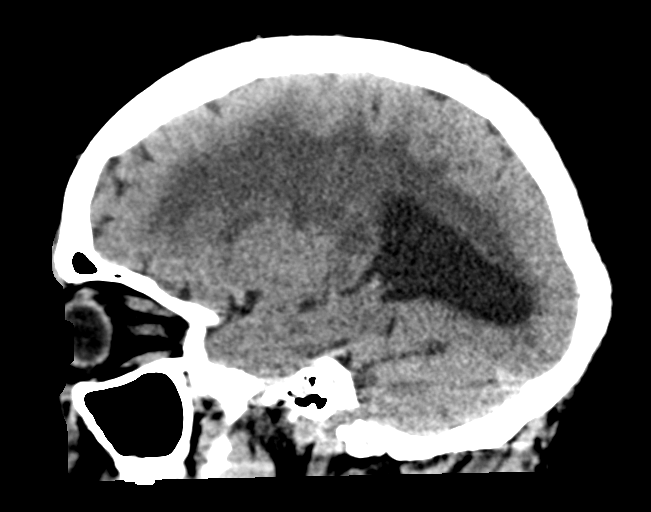
[im 30/59  brain]
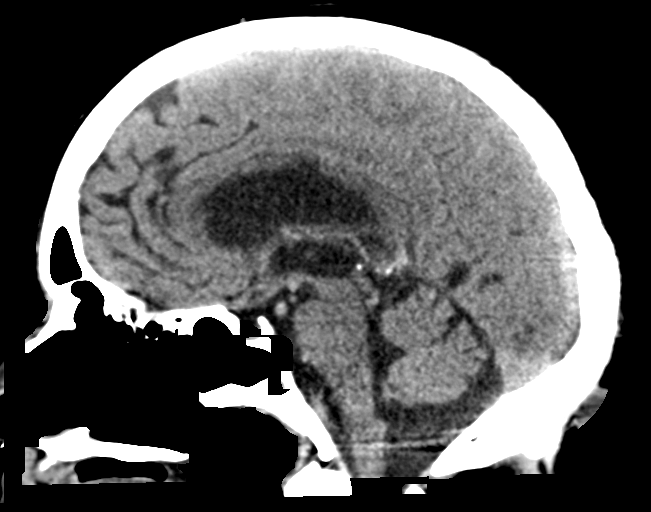
[im 39/59  brain]
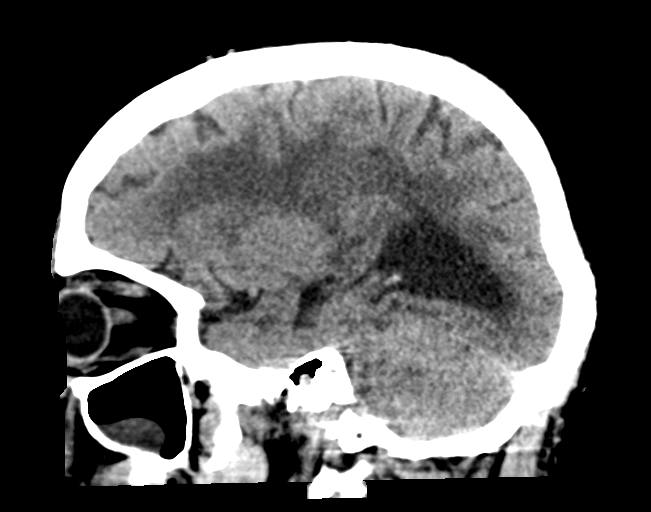

[15 of 47 positions shown; findings below may reference images not displayed]

FINDINGS: Brain: No acute territorial infarction, hemorrhage or intracranial
mass is seen. Known meningioma at the inferior left temporal lobe is
much better seen with contrast. Atrophy and advanced chronic small
vessel ischemic changes of the white matter. Stable ventricle size.
Chronic lacunar infarcts within the bilateral basal ganglia and
thalamus.

Vascular: No hyperdense vessels.  No unexpected calcification

Skull: Normal. Negative for fracture or focal lesion.

Sinuses/Orbits: Mucous retention cyst in the right maxillary sinus

Other: None
IMPRESSION: 1. No CT evidence for acute intracranial abnormality.
2. Atrophy and chronic small vessel ischemic changes the white
matter

## 2022-03-09 MED ORDER — SODIUM CHLORIDE 0.9 % IV SOLN: Freq: Once | INTRAVENOUS | Status: AC

## 2022-03-09 NOTE — ED Provider Notes (Signed)
?[facility] ?Provider Note ? ? ?CSN: [id number] ?Arrival date & time: 03/09/22  2031 ? ?  ? ?History ? ?Chief Complaint  ?Patient presents with  ? Chest Pain  ? Weakness  ? ? ?Bruce Sullivan is a 71 y.o. male. ? ?HPI ?At baseline patient is able to ambulate about his house.  He does have cognitive and speech delay secondary to prior stroke or meningioma.  He has history of recently treated lung cancer that appears to be responsive to treatment.  He has history of COPD.  Reportedly he got up at 3 AM to go to the bathroom.  His wife reports he ambulated without his cane and came back to bed.  She reports then at about 8 AM when it was time to get up, he could not get himself up or out of bed.  He reports that his legs were too weak.  He indicates that at first it seemed like the right leg was weak but subsequently it was the left.  He reports she had to get family members to come and help to move him.  She got him dressed and they ate meals today.  However, he never was able to walk independently which he normally does.  She reports with assistance it seemed that he was dragging first the right foot but then subsequently became the left foot.  She denies there was any change in his baseline speech which is slow and deliberate but able to communicate effectively.  She reports only since they were in the emergency department and examining him with me did she noticed that he has a very sore and red spot on the outer surface of the left foot.  Patient endorses it is very painful.  He did not know that was there.  Patient's wife reports that aside from today he has been normal baseline. ?  ? ?Home Medications ?Prior to Admission medications   ?Medication Sig Start Date End Date Taking? Authorizing Provider  ?albuterol (PROVENTIL) (2.5 MG/3ML) 0.083% nebulizer solution Take 3 mLs (2.5 mg total) by nebulization every 6 (six) hours as needed for wheezing or shortness of breath. 07/27/21    Icard, Octavio Graves, DO  ?albuterol (VENTOLIN HFA) 108 (90 Base) MCG/ACT inhaler Inhale 1-2 puffs into the lungs every 6 (six) hours as needed for wheezing or shortness of breath. 12/27/20   Luna Fuse, MD  ?allopurinol (ZYLOPRIM) 100 MG tablet Take 100 mg by mouth 2 (two) times daily. 08/02/18   [provider]  ?aspirin EC 81 MG tablet Take 81 mg by mouth daily. Swallow whole. ?Patient not taking: Reported on 09/12/2021    [provider]  ?atorvastatin (LIPITOR) 10 MG tablet Take 10 mg by mouth at bedtime.    [provider]  ?azithromycin (ZITHROMAX) 250 MG tablet Take 1 tablet (250 mg total) by mouth daily. 11/30/21   Garner Nash, DO  ?Budeson-Glycopyrrol-Formoterol (BREZTRI AEROSPHERE) 160-9-4.8 MCG/ACT AERO Inhale 2 puffs into the lungs in the morning and at bedtime. 12/23/21   Magdalen Spatz, NP  ?Budeson-Glycopyrrol-Formoterol (BREZTRI AEROSPHERE) 160-9-4.8 MCG/ACT AERO Inhale 2 puffs into the lungs in the morning and at bedtime. 01/20/22   Garner Nash, DO  ?Budeson-Glycopyrrol-Formoterol (BREZTRI AEROSPHERE) 160-9-4.8 MCG/ACT AERO Inhale 2 puffs into the lungs in the morning and at bedtime. 01/20/22   Garner Nash, DO  ?chlorthalidone (HYGROTON) 50 MG tablet Take 50 mg by mouth daily.    [provider]  ?FLUoxetine (  PROZAC) 20 MG capsule Take 20 mg by mouth daily. 08/02/18   [provider]  ?fluticasone-salmeterol (WIXELA INHUB) 250-50 MCG/ACT AEPB Inhale 1 puff into the lungs in the morning and at bedtime. 09/14/21   Martyn Ehrich, NP  ?Fluticasone-Umeclidin-Vilant (TRELEGY ELLIPTA) 100-62.5-25 MCG/ACT AEPB Inhale 1 puff into the lungs daily. 09/07/21   Garner Nash, DO  ?Fluticasone-Umeclidin-Vilant (TRELEGY ELLIPTA) 100-62.5-25 MCG/ACT AEPB Inhale 1 puff into the lungs daily. 09/12/21   Martyn Ehrich, NP  ?HYDROcodone-acetaminophen (NORCO) 5-325 MG tablet Take 1 tablet by mouth every 4 (four) hours as needed for moderate pain. 11/20/18    Ceasar Mons, MD  ?omeprazole (PRILOSEC) 20 MG capsule Take 20 mg by mouth daily.    [provider]  ?ondansetron (ZOFRAN-ODT) 4 MG disintegrating tablet Take 1 tablet (4 mg total) by mouth every 8 (eight) hours as needed for nausea. 12/02/21   Larene Pickett, PA-C  ?predniSONE (DELTASONE) 10 MG tablet Take 4 tabs by mouth once daily x4 days, then 3 tabs x4 days, 2 tabs x4 days, 1 tab x4 days and stop. 11/30/21   Garner Nash, DO  ?tamsulosin (FLOMAX) 0.4 MG CAPS capsule Take 0.4 mg by mouth daily.    [provider]  ?Tiotropium Bromide Monohydrate (SPIRIVA RESPIMAT) 2.5 MCG/ACT AERS Inhale 2 puffs into the lungs daily. 09/14/21 12/13/21  Martyn Ehrich, NP  ?   ? ?Allergies    ?Bee venom   ? ?Review of Systems   ?Review of Systems ?10 systems reviewed and negative except as per HPI ?Physical Exam ?Updated Vital Signs ?BP (!) 159/90   Pulse 89   Temp 99.5 ?F (37.5 ?C) (Oral)   Resp (!) 27   Ht 5\' 9"  (1.753 m)   Wt 90 kg   SpO2 100%   BMI 29.30 kg/m?  ?Physical Exam ?Constitutional:   ?   Comments: Patient is alert.  No respiratory distress.  ?HENT:  ?   Head: Normocephalic and atraumatic.  ?   Mouth/Throat:  ?   Mouth: Mucous membranes are moist.  ?   Pharynx: Oropharynx is clear.  ?Eyes:  ?   Extraocular Movements: Extraocular movements intact.  ?   Pupils: Pupils are equal, round, and reactive to light.  ?Cardiovascular:  ?   Rate and Rhythm: Normal rate and regular rhythm.  ?   Pulses: Normal pulses.  ?   Heart sounds: Normal heart sounds.  ?Pulmonary:  ?   Effort: Pulmonary effort is normal.  ?   Comments: Intermittent cough after deep inspiration.  No respiratory distress.  Fine scattered expiratory wheeze.  Breath sounds generally soft. ?Abdominal:  ?   General: There is no distension.  ?   Palpations: Abdomen is soft.  ?   Tenderness: There is no abdominal tenderness. There is no guarding.  ?Musculoskeletal:  ?   Cervical back: Neck supple.  ?   Comments: No  significant peripheral edema.  Patient does have severe onychomycosis and dry skin of the lower extremities.  He has a diffuse erythematous patch on the lateral surface of the left foot.  See attached images.  This area is very tender.  This is just noted after arrival to the emergency department.  ?Skin: ?   General: Skin is warm and dry.  ?Neurological:  ?   Comments: Patient is alert.  He is watching television.  He will answer questions in a slow and deliberate fashion.  Patient's wife reports that she does most  of the talking because he speaks slowly come to difficult.  Patient can follow commands to the best of his physical capacity.  He is cooperative for grip strength bilaterally.  Cranial nerves appear intact but possible droop of the left mouth.  Decreased strength with elevation of the right lower extremity off of the bed.  ?Psychiatric:  ?   Comments: Calm and cooperative  ? ? ?ED Results / Procedures / Treatments   ?Labs ?(all labs ordered are listed, but only abnormal results are displayed) ?Labs Reviewed  ?BASIC METABOLIC PANEL - Abnormal; Notable for the following components:  ?    Result Value  ? Glucose, Bld 124 (*)   ? Creatinine, Ser 1.26 (*)   ? All other components within normal limits  ?CBC - Abnormal; Notable for the following components:  ? WBC 11.3 (*)   ? RBC 4.18 (*)   ? Hemoglobin 12.9 (*)   ? HCT 38.5 (*)   ? All other components within normal limits  ?HEPATIC FUNCTION PANEL - Abnormal; Notable for the following components:  ? AST 12 (*)   ? All other components within normal limits  ?CBG MONITORING, ED - Abnormal; Notable for the following components:  ? Glucose-Capillary 132 (*)   ? All other components within normal limits  ?CULTURE, BLOOD (ROUTINE X 2)  ?CULTURE, BLOOD (ROUTINE X 2)  ?LACTIC ACID, PLASMA  ?AMMONIA  ?ETHANOL  ?URINALYSIS, ROUTINE W REFLEX MICROSCOPIC  ?LACTIC ACID, PLASMA  ?RAPID URINE DRUG SCREEN, HOSP PERFORMED  ?TROPONIN I (HIGH SENSITIVITY)  ?TROPONIN I (HIGH  SENSITIVITY)  ? ? ?EKG ?EKG Interpretation ? ?Date/Time:  Thursday Mar 09 2022 20:37:44 EDT ?Ventricular Rate:  96 ?PR Interval:  165 ?QRS Duration: 86 ?QT Interval:  335 ?QTC Calculation: 424 ?R Axis:   64 ?Text Interpret

## 2022-03-09 NOTE — ED Notes (Signed)
Patient began c/o shob and wheezing, states that he takes home neb treatments and is requesting a neb treatment.  MD Pfeiffer made aware.  ?

## 2022-03-09 NOTE — ED Notes (Signed)
Patient's family member reports patient has not been able to walk d/t pain in left foot.  Redness noted around top of foot/ankle. ?

## 2022-03-09 NOTE — ED Notes (Signed)
Blood cultures obtained from fresh IV, right forearm using kurin device.  ?

## 2022-03-09 NOTE — ED Notes (Addendum)
Patient cleaned of urinary incontinence, foul smelling, dark urine noted.  Peri care done and clean brief applied.  Family member at bedside. ?

## 2022-03-09 NOTE — ED Notes (Signed)
Patient to imaging.

## 2022-03-09 NOTE — ED Triage Notes (Signed)
Patient BIB [facility] from home c/o sudden onset centralized chest pain that started at 1915 with generalized weakness, unable to ambulate.  Family reports patient is at baseline mentation.  Pain relieved by nitro. ? ?18 L AC ?324 mg asa ?2 nitro tabs ? ?

## 2022-03-10 ENCOUNTER — Encounter (HOSPITAL_COMMUNITY): Payer: Self-pay | Admitting: Family Medicine

## 2022-03-10 ENCOUNTER — Other Ambulatory Visit (HOSPITAL_COMMUNITY): Payer: Self-pay

## 2022-03-10 ENCOUNTER — Observation Stay (HOSPITAL_COMMUNITY): Payer: Medicare HMO

## 2022-03-10 DIAGNOSIS — J341 Cyst and mucocele of nose and nasal sinus: Secondary | ICD-10-CM | POA: Diagnosis not present

## 2022-03-10 DIAGNOSIS — Z8673 Personal history of transient ischemic attack (TIA), and cerebral infarction without residual deficits: Secondary | ICD-10-CM | POA: Diagnosis not present

## 2022-03-10 DIAGNOSIS — Z743 Need for continuous supervision: Secondary | ICD-10-CM | POA: Diagnosis not present

## 2022-03-10 DIAGNOSIS — L03116 Cellulitis of left lower limb: Secondary | ICD-10-CM | POA: Diagnosis not present

## 2022-03-10 DIAGNOSIS — N182 Chronic kidney disease, stage 2 (mild): Secondary | ICD-10-CM | POA: Diagnosis not present

## 2022-03-10 DIAGNOSIS — I771 Stricture of artery: Secondary | ICD-10-CM | POA: Diagnosis not present

## 2022-03-10 DIAGNOSIS — R262 Difficulty in walking, not elsewhere classified: Secondary | ICD-10-CM

## 2022-03-10 DIAGNOSIS — J449 Chronic obstructive pulmonary disease, unspecified: Secondary | ICD-10-CM | POA: Diagnosis not present

## 2022-03-10 DIAGNOSIS — R279 Unspecified lack of coordination: Secondary | ICD-10-CM | POA: Diagnosis not present

## 2022-03-10 DIAGNOSIS — R29818 Other symptoms and signs involving the nervous system: Secondary | ICD-10-CM | POA: Diagnosis not present

## 2022-03-10 DIAGNOSIS — I611 Nontraumatic intracerebral hemorrhage in hemisphere, cortical: Secondary | ICD-10-CM | POA: Diagnosis not present

## 2022-03-10 DIAGNOSIS — R079 Chest pain, unspecified: Secondary | ICD-10-CM

## 2022-03-10 LAB — CBC
HCT: 36.2 % — ABNORMAL LOW (ref 39.0–52.0)
Hemoglobin: 12.4 g/dL — ABNORMAL LOW (ref 13.0–17.0)
MCH: 31.3 pg (ref 26.0–34.0)
MCHC: 34.3 g/dL (ref 30.0–36.0)
MCV: 91.4 fL (ref 80.0–100.0)
Platelets: 253 10*3/uL (ref 150–400)
RBC: 3.96 MIL/uL — ABNORMAL LOW (ref 4.22–5.81)
RDW: 12 % (ref 11.5–15.5)
WBC: 6.9 10*3/uL (ref 4.0–10.5)
nRBC: 0 % (ref 0.0–0.2)

## 2022-03-10 LAB — URINALYSIS, ROUTINE W REFLEX MICROSCOPIC
Bilirubin Urine: NEGATIVE
Glucose, UA: NEGATIVE mg/dL
Ketones, ur: NEGATIVE mg/dL
Nitrite: NEGATIVE
Protein, ur: 30 mg/dL — AB
Specific Gravity, Urine: 1.018 (ref 1.005–1.030)
WBC, UA: >50 WBC/hpf — ABNORMAL HIGH (ref 0–5)
pH: 7 (ref 5.0–8.0)

## 2022-03-10 LAB — HIV ANTIBODY (ROUTINE TESTING W REFLEX): HIV Screen 4th Generation wRfx: NONREACTIVE

## 2022-03-10 LAB — BASIC METABOLIC PANEL
Anion gap: 9 (ref 5–15)
BUN: 19 mg/dL (ref 8–23)
CO2: 24 mmol/L (ref 22–32)
Calcium: 9.1 mg/dL (ref 8.9–10.3)
Chloride: 104 mmol/L (ref 98–111)
Creatinine, Ser: 1.14 mg/dL (ref 0.61–1.24)
GFR, Estimated: >60 mL/min (ref >60)
Glucose, Bld: 124 mg/dL — ABNORMAL HIGH (ref 70–99)
Potassium: 3.6 mmol/L (ref 3.5–5.1)
Sodium: 137 mmol/L (ref 135–145)

## 2022-03-10 LAB — RAPID URINE DRUG SCREEN, HOSP PERFORMED
Amphetamines: NOT DETECTED
Barbiturates: NOT DETECTED
Benzodiazepines: NOT DETECTED
Cocaine: NOT DETECTED
Opiates: NOT DETECTED
Tetrahydrocannabinol: NOT DETECTED

## 2022-03-10 IMAGING — MR MR HEAD W/O CM
13 of 15 series · 40 of 48 positions shown · non-contrast
Comparison: Brain MRI without and with contrast [DATE].

CLINICAL DATA: 70-year-old male with neurologic deficit. Non-small
cell lung cancer. Left temporal lobe meningioma suspected.

EXAM:
MRI HEAD WITHOUT CONTRAST
TECHNIQUE: Multiplanar, multiecho pulse sequences of the brain and surrounding
structures were obtained without intravenous contrast.

[Series 5: DWI · axial · 3.0mm · 0.96mm/px · z∈[-107,+62]mm · 6 of 116 slices shown (1 of 4)]
[im 1/116]
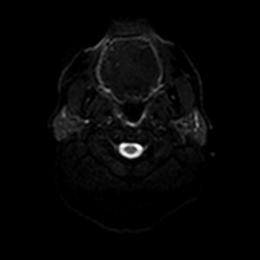
[im 24/116]
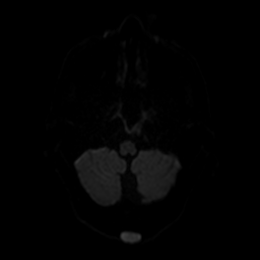
[im 47/116]
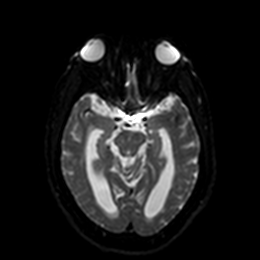
[im 70/116]
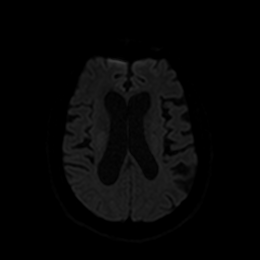
[im 93/116]
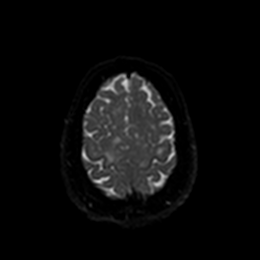
[im 116/116]
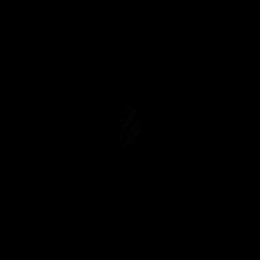

[Series 6: DWI · axial · 3.0mm · 0.96mm/px · z∈[-107,+62]mm · 2 of 58 slices shown (2 of 4)]
[im 1/58]
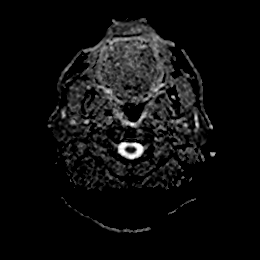
[im 58/58]
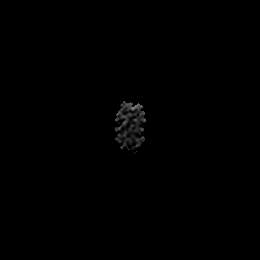

[Series 7: DWI · coronal · 4.0mm · 0.88mm/px · 4 of 86 slices shown (3 of 4)]
[im 1/86]
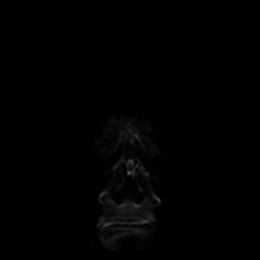
[im 29/86]
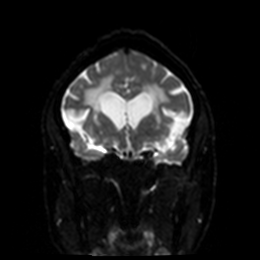
[im 57/86]
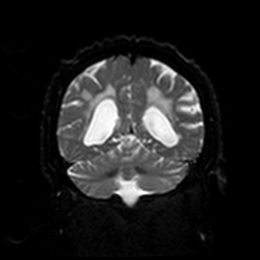
[im 86/86]
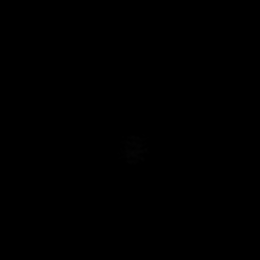

[Series 8: DWI · coronal · 4.0mm · 0.88mm/px · 3 of 43 slices shown (4 of 4)]
[im 1/43]
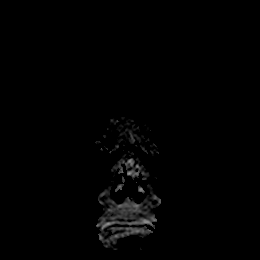
[im 22/43]
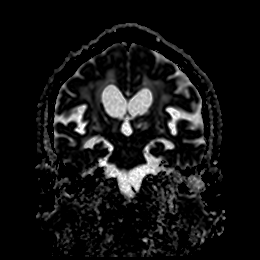
[im 43/43]
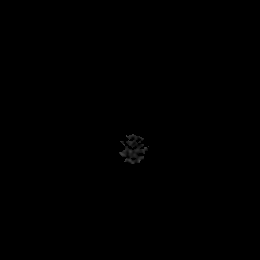

[Series 9: T1 · sagittal · 5.0mm · 0.78mm/px · 2 of 29 slices shown]
[im 1/29]
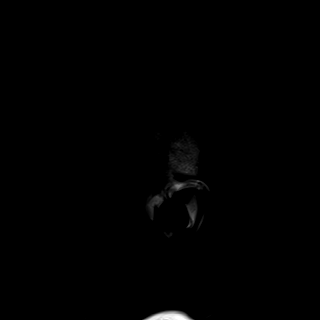
[im 29/29]
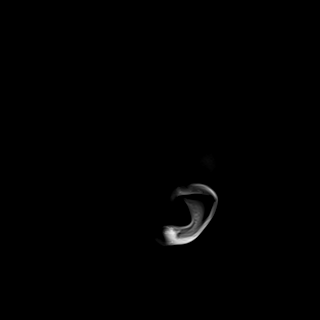

[Series 10: T2 · axial · 5.0mm · 0.78mm/px · z∈[-111,+66]mm · 2 of 31 slices shown (1 of 3)]
[im 1/31]
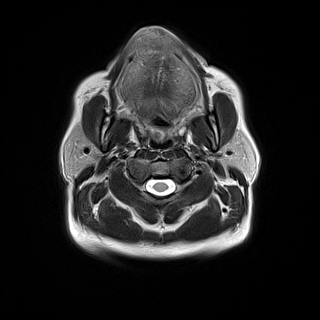
[im 31/31]
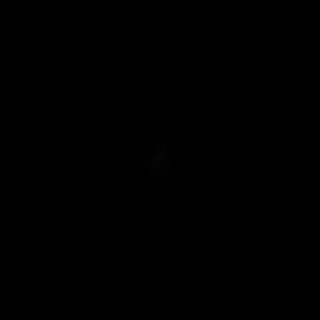

[Series 11: FLAIR · axial · 5.0mm · 0.49mm/px · z∈[-109,+69]mm · 2 of 31 slices shown]
[im 1/31]
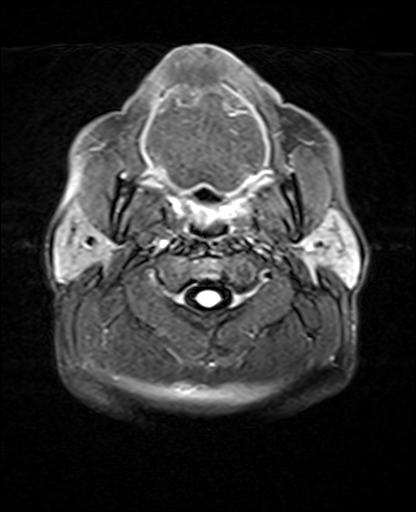
[im 31/31]
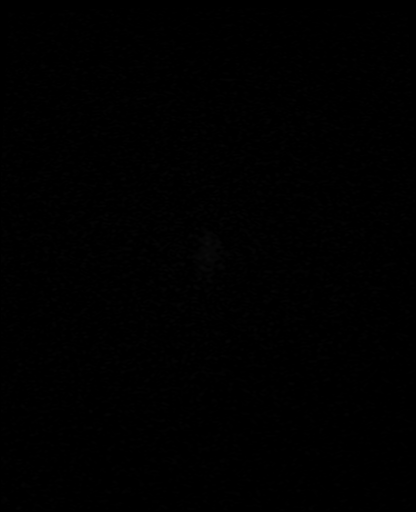

[Series 12: mag_images · axial · 3.0mm · 0.98mm/px · z∈[-108,+67]mm · 4 of 60 slices shown]
[im 1/60]
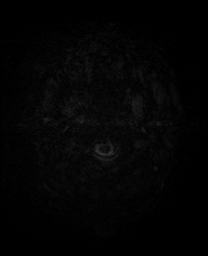
[im 20/60]
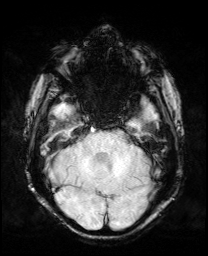
[im 40/60]
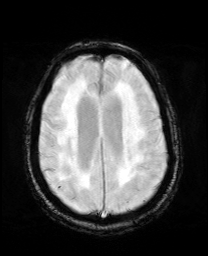
[im 60/60]
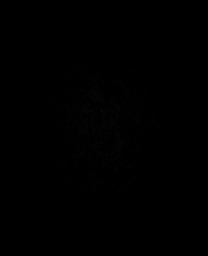

[Series 13: pha_images · axial · 3.0mm · 0.98mm/px · z∈[-108,+67]mm · 4 of 60 slices shown]
[im 1/60]
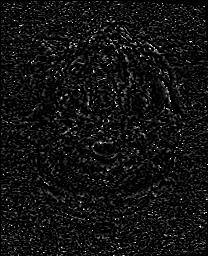
[im 20/60]
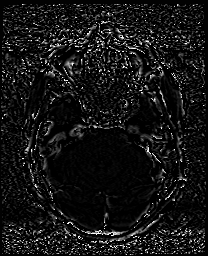
[im 40/60]
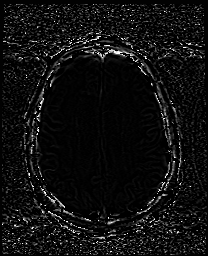
[im 60/60]
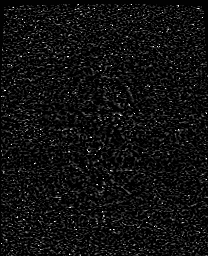

[Series 14: swi_images · axial · 3.0mm · 0.98mm/px · z∈[-108,+67]mm · 4 of 60 slices shown]
[im 1/60]
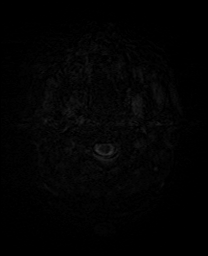
[im 20/60]
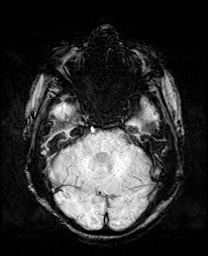
[im 40/60]
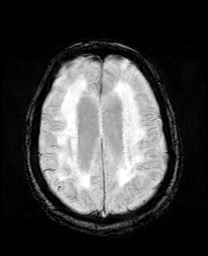
[im 60/60]
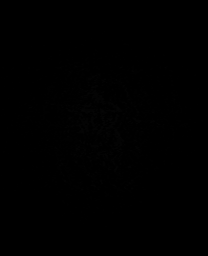

[Series 15: mip_images(sw) · axial · 24.0mm · 0.98mm/px · z∈[-97,+57]mm · 3 of 53 slices shown]
[im 1/53]
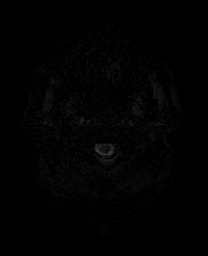
[im 27/53]
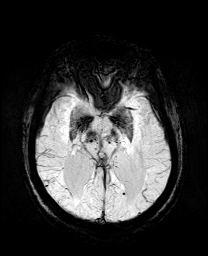
[im 53/53]
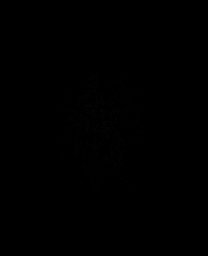

[Series 17: T2 · coronal · 5.0mm · 0.34mm/px · 2 of 36 slices shown (2 of 3)]
[im 1/36]
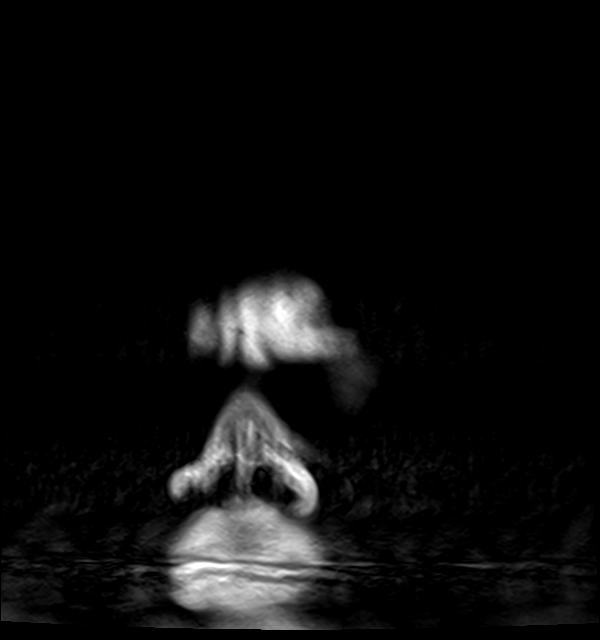
[im 36/36]
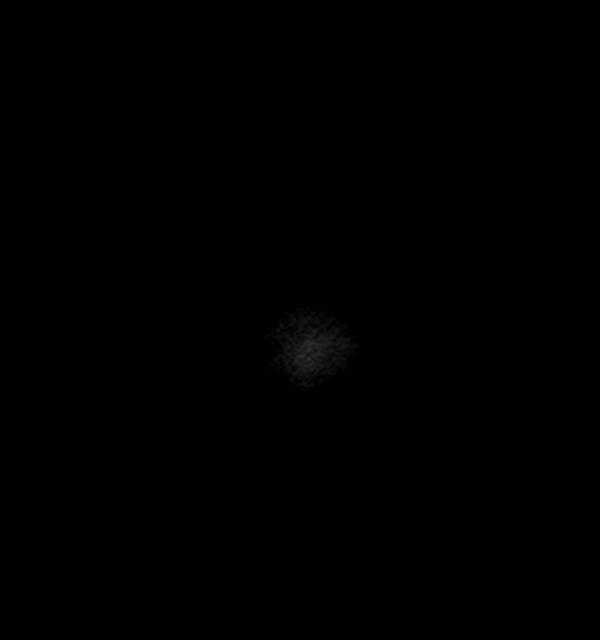

[Series 19: T2 · coronal · 5.0mm · 0.72mm/px · 2 of 36 slices shown (3 of 3)]
[im 1/36]
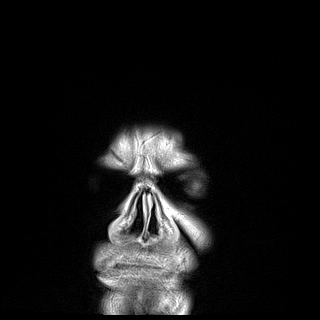
[im 36/36]
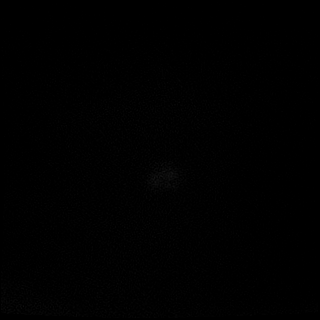

[40 of 48 positions shown; findings below may reference images not displayed]

FINDINGS: Brain: No convincing restricted diffusion. Chronic severe
small-vessel disease with confluent bilateral cerebral white matter
signal abnormality and extensive chronic lacunar infarcts throughout
the bilateral deep gray nuclei. Lesser chronic involvement of the
pons. Associated extensive deep gray nuclei chronic
microhemorrhages. Stable occasional chronic microhemorrhage
elsewhere (left occipital lobe series 14, image 22).

Stable gray and white matter signal throughout the brain.

Known left middle cranial fossa enhancing extra-axial mass is poorly
visible in the absence of contrast. No evidence of associated
cerebral edema.

No midline shift, mass effect, ventriculomegaly, extra-axial
collection or acute intracranial hemorrhage. Cervicomedullary
junction and pituitary are within normal limits.

Vascular: Major intracranial vascular flow voids are stable, with
some intracranial artery tortuosity.

Skull and upper cervical spine: Stable.

Sinuses/Orbits: Stable. Right maxillary sinus mucous retention cyst.

Other: Mastoids remain clear. Suboccipital probable scalp sebaceous
cyst appears unchanged.
IMPRESSION: 1. No acute intracranial abnormality on this noncontrast exam. No
strong evidence of metastatic disease in the absence of IV contrast,
and known left middle cranial fossa meningioma is poorly visible
without contrast.

2. Chronic severe cerebral small vessel disease.

## 2022-03-10 MED ORDER — SENNOSIDES-DOCUSATE SODIUM 8.6-50 MG PO TABS: 1.0000 | ORAL_TABLET | Freq: Every evening | ORAL | Status: DC | PRN

## 2022-03-10 MED ORDER — CEPHALEXIN 500 MG PO CAPS
500.0000 mg | ORAL_CAPSULE | Freq: Four times a day (QID) | ORAL | 0 refills | Status: AC
Start: 2022-03-10 — End: 2022-03-17
  Filled 2022-03-10: qty 28, 7d supply, fill #0

## 2022-03-10 MED ORDER — ENOXAPARIN SODIUM 40 MG/0.4ML IJ SOSY: 40.0000 mg | PREFILLED_SYRINGE | INTRAMUSCULAR | Status: DC

## 2022-03-10 MED ORDER — BUDESON-GLYCOPYRROL-FORMOTEROL 160-9-4.8 MCG/ACT IN AERO: 2.0000 | INHALATION_SPRAY | Freq: Two times a day (BID) | RESPIRATORY_TRACT | Status: DC

## 2022-03-10 MED ORDER — METHYLPREDNISOLONE SODIUM SUCC 125 MG IJ SOLR
80.0000 mg | Freq: Once | INTRAMUSCULAR | Status: AC
  Administered 2022-03-10: 80 mg via INTRAVENOUS
  Filled 2022-03-10: qty 2

## 2022-03-10 MED ORDER — FLUTICASONE FUROATE-VILANTEROL 200-25 MCG/ACT IN AEPB
1.0000 | INHALATION_SPRAY | Freq: Every day | RESPIRATORY_TRACT | Status: DC
  Filled 2022-03-10: qty 28

## 2022-03-10 MED ORDER — ONDANSETRON HCL 4 MG/2ML IJ SOLN: 4.0000 mg | Freq: Four times a day (QID) | INTRAMUSCULAR | Status: DC | PRN

## 2022-03-10 MED ORDER — COLCHICINE 0.6 MG PO TABS
0.6000 mg | ORAL_TABLET | Freq: Every day | ORAL | 0 refills | Status: AC
  Filled 2022-03-10: qty 5, 5d supply, fill #0

## 2022-03-10 MED ORDER — LORAZEPAM 2 MG/ML IJ SOLN: 0.5000 mg | Freq: Once | INTRAMUSCULAR | Status: DC | PRN

## 2022-03-10 MED ORDER — KETOROLAC TROMETHAMINE 15 MG/ML IJ SOLN
15.0000 mg | Freq: Once | INTRAMUSCULAR | Status: AC
  Administered 2022-03-10: 15 mg via INTRAVENOUS
  Filled 2022-03-10: qty 1

## 2022-03-10 MED ORDER — ATORVASTATIN CALCIUM 10 MG PO TABS
10.0000 mg | ORAL_TABLET | Freq: Every day | ORAL | Status: DC
Start: 2022-03-10 — End: 2022-03-10

## 2022-03-10 MED ORDER — COLCHICINE 0.6 MG PO TABS
0.6000 mg | ORAL_TABLET | Freq: Once | ORAL | Status: DC
  Filled 2022-03-10: qty 1

## 2022-03-10 MED ORDER — ACETAMINOPHEN 650 MG RE SUPP
650.0000 mg | Freq: Four times a day (QID) | RECTAL | Status: DC | PRN
Start: 2022-03-10 — End: 2022-03-10

## 2022-03-10 MED ORDER — SODIUM CHLORIDE 0.9% FLUSH: 3.0000 mL | Freq: Two times a day (BID) | INTRAVENOUS | Status: DC

## 2022-03-10 MED ORDER — CEFTRIAXONE SODIUM 1 G IJ SOLR
1.0000 g | Freq: Once | INTRAMUSCULAR | Status: AC
Start: 2022-03-10 — End: 2022-03-10
  Administered 2022-03-10: 1 g via INTRAVENOUS
  Filled 2022-03-10: qty 10

## 2022-03-10 MED ORDER — SODIUM CHLORIDE 0.9 % IV SOLN
1.0000 g | INTRAVENOUS | Status: DC
  Administered 2022-03-10: 1 g via INTRAVENOUS
  Filled 2022-03-10: qty 10

## 2022-03-10 MED ORDER — ASPIRIN EC 81 MG PO TBEC: 81.0000 mg | DELAYED_RELEASE_TABLET | Freq: Every day | ORAL | Status: DC

## 2022-03-10 MED ORDER — SODIUM CHLORIDE 0.9 % IV SOLN: 1.0000 g | INTRAVENOUS | Status: DC

## 2022-03-10 MED ORDER — UMECLIDINIUM BROMIDE 62.5 MCG/ACT IN AEPB
1.0000 | INHALATION_SPRAY | Freq: Every day | RESPIRATORY_TRACT | Status: DC
  Filled 2022-03-10: qty 7

## 2022-03-10 MED ORDER — ACETAMINOPHEN 325 MG PO TABS: 650.0000 mg | ORAL_TABLET | Freq: Four times a day (QID) | ORAL | Status: DC | PRN

## 2022-03-10 MED ORDER — SODIUM CHLORIDE 0.9 % IV SOLN: 250.0000 mL | INTRAVENOUS | Status: DC | PRN

## 2022-03-10 MED ORDER — ALBUTEROL SULFATE HFA 108 (90 BASE) MCG/ACT IN AERS
2.0000 | INHALATION_SPRAY | RESPIRATORY_TRACT | Status: DC | PRN
  Administered 2022-03-10 (×2): 2 via RESPIRATORY_TRACT
  Filled 2022-03-10: qty 6.7

## 2022-03-10 MED ORDER — ONDANSETRON HCL 4 MG PO TABS: 4.0000 mg | ORAL_TABLET | Freq: Four times a day (QID) | ORAL | Status: DC | PRN

## 2022-03-10 MED ORDER — SODIUM CHLORIDE 0.9% FLUSH: 3.0000 mL | INTRAVENOUS | Status: DC | PRN

## 2022-03-10 MED ORDER — ASPIRIN 81 MG PO CHEW
324.0000 mg | CHEWABLE_TABLET | Freq: Once | ORAL | Status: AC
Start: 2022-03-10 — End: 2022-03-10
  Administered 2022-03-10: 324 mg via ORAL
  Filled 2022-03-10: qty 4

## 2022-03-10 NOTE — ED Notes (Signed)
DC instructions given to  PTAR along with transport paperwork ?

## 2022-03-10 NOTE — H&P (Signed)
?History and Physical  ? ? ?[name]:[id number] DOB: September 04, 1951 DOA: 03/09/2022 ? ?PCP: Wenda Low, MD  ? ?Patient coming from: Home  ? ?Chief Complaint: Left foot pain and redness, unable to ambulate, chest pain  ? ?HPI: Bruce Sullivan is a pleasant 71 y.o. male with medical history significant for COPD, stage I adenocarcinoma of the left upper lobe status post SBRT, CKD stage II, depression, anxiety, meningioma, history of CVA, and cognitive deficit who presents to the emergency department with pain, redness, and swelling involving the left foot, inability to ambulate independently, and chest pain.  Patient reports being able to ambulate to the bathroom unassisted at roughly 3 AM on 03/09/2022, but was then unable to get out of bed without assistance at 8 AM.  He initially felt that his left leg "would not work," but later attributed his difficulty to left foot pain and swelling.  He sometimes uses a walker or cane at home but today required assistance from family to ambulate.  Ambulatory difficulty persisted throughout the day and then the patient had an episode of discomfort in the central chest with shortness of breath yesterday evening.  EMS was called, patient was treated with 2 doses of nitroglycerin and 324 mg of aspirin, and had resolution of the chest discomfort prior to arrival in the ED.  He denies any recent fevers or chills, denies change in his mild nonproductive cough, denies numbness or tingling, and denies any change in vision or headache. ? ?ED Course: Upon arrival to the ED, patient is found to be afebrile, saturating 100% on 2 L/min of supplemental oxygen, and mildly hypertensive.  EKG features sinus rhythm with PAC.  Chest x-ray negative for acute cardiopulmonary disease.  Plain radiographs of the left foot negative for acute osseous abnormality.  No acute intracranial abnormality noted on head CT.  Chemistry panel with creatinine 1.26.  CBC notable for mild leukocytosis.  Lactic acid is  normal and troponin was normal x2.  Blood cultures were collected, patient was given a dose of Rocephin, and MRI brain was ordered but not yet performed. ? ?Review of Systems:  ?All other systems reviewed and apart from HPI, are negative. ? ?Past Medical History:  ?Diagnosis Date  ? Anxiety   ? Arthritis   ? Asthma   ? Atherosclerosis 01/16/2015  ? Noted on CXR  ? Bilateral lower extremity edema 01/16/2015  ? Chronic kidney disease (CKD), stage II (mild)   ? COPD (chronic obstructive pulmonary disease) ([place])   ? Dementia ([place])   ? Depression   ? Dyspnea   ? GERD (gastroesophageal reflux disease)   ? Gout   ? Hepatitis 12/2019  ? Hx Hep C positive in blood. Treated.  ? History of colon polyps   ? Hoarseness   ? Hyperlipidemia   ? Hypertension   ? Lung cancer ([place]) 09/07/2021  ? Neck abscess 09/03/2016  ? Pre-diabetes   ? Pulmonary emphysema ([place])   ? Seizure ([place])   ? Stroke [facility]) 2014  ? Thyroid disease   ? TIA (transient ischemic attack)   ? Urinary frequency   ? Urine retention   ? UTI (urinary tract infection)   ? ? ?Past Surgical History:  ?Procedure Laterality Date  ? BRONCHIAL BIOPSY  09/07/2021  ? Procedure: BRONCHIAL BIOPSIES;  Surgeon: Garner Nash, DO;  Location: [facility]COPY;  Service: Pulmonary;;  ? BRONCHIAL BRUSHINGS  09/07/2021  ? Procedure: BRONCHIAL BRUSHINGS;  Surgeon: Garner Nash, DO;  Location: [facility]  ENDOSCOPY;  Service: Pulmonary;;  ? BRONCHIAL NEEDLE ASPIRATION BIOPSY  09/07/2021  ? Procedure: BRONCHIAL NEEDLE ASPIRATION BIOPSIES;  Surgeon: Garner Nash, DO;  Location: [facility];  Service: Pulmonary;;  ? COLONOSCOPY  08/29/2018  ? CYSTOSCOPY WITH BIOPSY N/A 11/20/2018  ? Procedure: CYSTOSCOPY WITH BLADDER BIOPSY;  Surgeon: Ceasar Mons, MD;  Location: [facility];  Service: Urology;  Laterality: N/A;  ? FIDUCIAL MARKER PLACEMENT  09/07/2021  ? Procedure: FIDUCIAL MARKER PLACEMENT;  Surgeon: Garner Nash, DO;  Location: [facility] ENDOSCOPY;  Service: Pulmonary;;   ? HERNIA REPAIR    ? t monitor    ? TRANSURETHRAL RESECTION OF PROSTATE N/A 11/20/2018  ? Procedure: TRANSURETHRAL RESECTION OF THE PROSTATE (TURP);  Surgeon: Ceasar Mons, MD;  Location: [facility];  Service: Urology;  Laterality: N/A;  ? VIDEO BRONCHOSCOPY WITH ENDOBRONCHIAL NAVIGATION Bilateral 09/07/2021  ? Procedure: VIDEO BRONCHOSCOPY WITH ENDOBRONCHIAL NAVIGATION;  Surgeon: Garner Nash, DO;  Location: [facility];  Service: Pulmonary;  Laterality: Bilateral;  ION, with fiducial placement  ? ? ?Social History:  ? reports that he quit smoking about 8 years ago. His smoking use included e-cigarettes. He has never used smokeless tobacco. He reports that he does not drink alcohol and does not use drugs. ? ?Allergies  ?Allergen Reactions  ? Bee Venom Anaphylaxis  ?  Face gets red and hard to breathe ?  ? ? ?Family History  ?Problem Relation Age of Onset  ? Liver cancer Neg Hx   ? Liver disease Neg Hx   ? ? ? ?Prior to Admission medications   ?Medication Sig Start Date End Date Taking? Authorizing Provider  ?albuterol (PROVENTIL) (2.5 MG/3ML) 0.083% nebulizer solution Take 3 mLs (2.5 mg total) by nebulization every 6 (six) hours as needed for wheezing or shortness of breath. 07/27/21   Icard, Octavio Graves, DO  ?albuterol (VENTOLIN HFA) 108 (90 Base) MCG/ACT inhaler Inhale 1-2 puffs into the lungs every 6 (six) hours as needed for wheezing or shortness of breath. 12/27/20   Luna Fuse, MD  ?allopurinol (ZYLOPRIM) 100 MG tablet Take 100 mg by mouth 2 (two) times daily. 08/02/18   [provider]  ?aspirin EC 81 MG tablet Take 81 mg by mouth daily. Swallow whole. ?Patient not taking: Reported on 09/12/2021    [provider]  ?atorvastatin (LIPITOR) 10 MG tablet Take 10 mg by mouth at bedtime.    [provider]  ?azithromycin (ZITHROMAX) 250 MG tablet Take 1 tablet (250 mg total) by mouth daily. 11/30/21   Garner Nash, DO  ?Budeson-Glycopyrrol-Formoterol  (BREZTRI AEROSPHERE) 160-9-4.8 MCG/ACT AERO Inhale 2 puffs into the lungs in the morning and at bedtime. 12/23/21   Magdalen Spatz, NP  ?Budeson-Glycopyrrol-Formoterol (BREZTRI AEROSPHERE) 160-9-4.8 MCG/ACT AERO Inhale 2 puffs into the lungs in the morning and at bedtime. 01/20/22   Garner Nash, DO  ?Budeson-Glycopyrrol-Formoterol (BREZTRI AEROSPHERE) 160-9-4.8 MCG/ACT AERO Inhale 2 puffs into the lungs in the morning and at bedtime. 01/20/22   Garner Nash, DO  ?chlorthalidone (HYGROTON) 50 MG tablet Take 50 mg by mouth daily.    [provider]  ?FLUoxetine (PROZAC) 20 MG capsule Take 20 mg by mouth daily. 08/02/18   [provider]  ?fluticasone-salmeterol (WIXELA INHUB) 250-50 MCG/ACT AEPB Inhale 1 puff into the lungs in the morning and at bedtime. 09/14/21   Martyn Ehrich, NP  ?Fluticasone-Umeclidin-Vilant (TRELEGY ELLIPTA) 100-62.5-25 MCG/ACT AEPB Inhale 1 puff into the lungs daily. 09/07/21  Garner Nash, DO  ?Fluticasone-Umeclidin-Vilant (TRELEGY ELLIPTA) 100-62.5-25 MCG/ACT AEPB Inhale 1 puff into the lungs daily. 09/12/21   Martyn Ehrich, NP  ?HYDROcodone-acetaminophen (NORCO) 5-325 MG tablet Take 1 tablet by mouth every 4 (four) hours as needed for moderate pain. 11/20/18   Ceasar Mons, MD  ?omeprazole (PRILOSEC) 20 MG capsule Take 20 mg by mouth daily.    [provider]  ?ondansetron (ZOFRAN-ODT) 4 MG disintegrating tablet Take 1 tablet (4 mg total) by mouth every 8 (eight) hours as needed for nausea. 12/02/21   Larene Pickett, PA-C  ?predniSONE (DELTASONE) 10 MG tablet Take 4 tabs by mouth once daily x4 days, then 3 tabs x4 days, 2 tabs x4 days, 1 tab x4 days and stop. 11/30/21   Garner Nash, DO  ?tamsulosin (FLOMAX) 0.4 MG CAPS capsule Take 0.4 mg by mouth daily.    [provider]  ?Tiotropium Bromide Monohydrate (SPIRIVA RESPIMAT) 2.5 MCG/ACT AERS Inhale 2 puffs into the lungs daily. 09/14/21 12/13/21  Martyn Ehrich, NP   ? ? ?Physical Exam: ?Vitals:  ? 03/09/22 2245 03/09/22 2300 03/09/22 2345 03/10/22 0015  ?BP: (!) 152/92 (!) 148/136 (!) 159/90 (!) 145/112  ?Pulse: 94 87 89 94  ?Resp: (!) 30 (!) 28 (!) 27 (!) 28  ?Temp:

## 2022-03-10 NOTE — Progress Notes (Signed)
Transition of Care [facility]) - Emergency Department Mini Assessment ? ? ?Patient Details  ?Name: Bruce Sullivan ?MRN: [id number] ?Date of Birth: [birth year] ? ?Transition of Care (TOC) CM/SW Contact:    ?Fuller Mandril, RN ?Phone Number: ?03/10/2022, 1:28 PM ? ? ?Clinical Narrative: ?Nakeem Murnane J. Clydene Laming, [place], [place], [place] [phone] ?RNCM spoke with pt at bedside regarding discharge planning for [place]. Offered pt medicare.gov list of home health agencies to choose from.  Pt chose [facility] to render services. Freddie Breech of [facility] notified. Patient made aware that [facility] will be in contact in 24-48 hours.   DME needs identified at this time include rolling walker.  [facility] will deliver to pt at bedside prior to discharge home. ? ? ? ?ED Mini Assessment: ?What brought you to the Emergency Department? : (P) Left foot pain and chest pain ? ?Barriers to Discharge: (P) ED DME delivery, Barriers Resolved ? ?  ? ?Means of departure: (P) Ambulance ? ?  ? ? ? ?Patient Contact and Communications ?  ?  ?  ? ,     ?  ?  ? ?Patient states their goals for this hospitalization and ongoing recovery are:: (P) Go home and get some rest ?  ?  ? ?Admission diagnosis:  Cellulitis of left foot [L03.116] ?Patient Active Problem List  ? Diagnosis Date Noted  ? Cellulitis of left foot 03/10/2022  ? Ambulatory dysfunction 03/10/2022  ? Chest pain 03/10/2022  ? Malignant neoplasm of bronchus of left upper lobe ([place]) 10/21/2021  ? Lung nodule 08/16/2021  ? History of alcohol dependence ([place]) 12/18/2019  ? BPH (benign prostatic hyperplasia) 12/18/2019  ? Hepatitis C antibody positive in blood 12/18/2019  ? Abdominal distension 12/18/2019  ? Current moderate episode of major depressive disorder without prior episode ([place]) 04/10/2017  ? Stage 2 chronic kidney disease 04/10/2017  ? Chronic obstructive pulmonary disease ([place]) 03/26/2017  ? Essential hypertension 03/26/2017  ? GAD (generalized anxiety disorder) 03/26/2017  ? GERD  (gastroesophageal reflux disease) 03/26/2017  ? History of CVA (cerebrovascular accident) 03/26/2017  ? Mixed hyperlipidemia 03/26/2017  ? Seizure disorder ([place]) 03/26/2017  ? ?PCP:  Wenda Low, MD ?Pharmacy:   ?[id number] [address] ?[address] ?[place] [address] ?Phone: [phone] Fax: [phone] ? ?[place], [address] ?[place] [address] ?Phone: [phone] Fax: [phone] ? ?[place], [place] [address] ?[phone] ?[place] [address] ?Phone: [phone] Fax: [phone] ? ?[facility] ?[address] ?[place] [address] ?Phone: [phone] Fax: [phone] ? ? ?

## 2022-03-10 NOTE — Evaluation (Signed)
Physical Therapy Evaluation ?Patient Details ?Name: Bruce Sullivan ?MRN: [id number] ?DOB: 11-04-1951 ?Today's Date: 03/10/2022 ? ?History of Present Illness ? Pt presented to ED with lt foot cellulitis and inability to ambulate independently. Pt also with episode of chest pain. PMH - copd, lung CA, ckd, anxiety, depression, CVA, cognitive deficit, gout, htn seizure.  ?Clinical Impression ? Pt presents to PT with decr in mobility due to lt foot pain due to cellulitis. Able to amb short distance with assist using rolling walker to decr pain in lt foot. Pt not familiar with using walker and needed assist and cues to use due to cognitive deficits. Currently pt will need a little assist with mobility if he returns home. Unsure if family can provide assist. If they can he could return home with HHPT and rolling walker. If they can't he may need a couple more days before he is able to amb on his own.   ?   ? ?Recommendations for follow up therapy are one component of a multi-disciplinary discharge planning process, led by the attending physician.  Recommendations may be updated based on patient status, additional functional criteria and insurance authorization. ? ?Follow Up Recommendations Home health PT (if family can provide min assist with mobility) ? ?  ?Assistance Recommended at Discharge Frequent or constant Supervision/Assistance  ?Patient can return home with the following ? A little help with walking and/or transfers;A little help with bathing/dressing/bathroom;Assistance with cooking/housework;Assist for transportation;Help with stairs or ramp for entrance ? ?  ?Equipment Recommendations Rolling walker (2 wheels)  ?Recommendations for Other Services ?    ?  ?Functional Status Assessment Patient has had a recent decline in their functional status and demonstrates the ability to make significant improvements in function in a reasonable and predictable amount of time.  ? ?  ?Precautions / Restrictions  Precautions ?Precautions: Fall  ? ?  ? ?Mobility ? Bed Mobility ?Overal bed mobility: Needs Assistance ?Bed Mobility: Supine to Sit, Sit to Supine ?  ?  ?Supine to sit: Min assist, HOB elevated ?Sit to supine: Min assist ?  ?General bed mobility comments: Assist to bring LLE off of bed to come to sitting and to bring leg back up into bed returning to supine. ?  ? ?Transfers ?Overall transfer level: Needs assistance ?Equipment used: Rolling walker (2 wheels) ?Transfers: Sit to/from Stand ?Sit to Stand: Min guard ?  ?  ?  ?  ?  ?General transfer comment: Assist for safety and verbal cues for hand placement ?  ? ?Ambulation/Gait ?Ambulation/Gait assistance: Min assist ?Gait Distance (Feet): 60 Feet ?Assistive device: Rolling walker (2 wheels) ?Gait Pattern/deviations: Step-to pattern, Decreased step length - right, Decreased stance time - left, Knee flexed in stance - right, Knee flexed in stance - left, Antalgic, Trunk flexed ?Gait velocity: decr ?Gait velocity interpretation: <1.31 ft/sec, indicative of household ambulator ?  ?General Gait Details: Assist to [facility] walker around obstacles. Verbal cues to stay closer to walker. ? ?Stairs ?  ?  ?  ?  ?  ? ?Wheelchair Mobility ?  ? ?Modified Rankin (Stroke Patients Only) ?  ? ?  ? ?Balance Overall balance assessment: Needs assistance ?Sitting-balance support: No upper extremity supported, Feet supported ?Sitting balance-Leahy Scale: Good ?  ?  ?Standing balance support: No upper extremity supported, During functional activity ?Standing balance-Leahy Scale: Fair ?  ?  ?  ?  ?  ?  ?  ?  ?  ?  ?  ?  ?   ? ? ? ?  Pertinent Vitals/Pain Pain Assessment ?Pain Assessment: Faces ?Faces Pain Scale: Hurts even more ?Pain Location: lt foot ?Pain Descriptors / Indicators: Grimacing, Guarding ?Pain Intervention(s): Limited activity within patient's tolerance, Repositioned  ? ? ?Home Living Family/patient expects to be discharged to:: Private residence ?Living Arrangements:  Spouse/significant other ?Available Help at Discharge: Family;Available 24 hours/day ?Type of Home: House ?Home Access: Stairs to enter ?Entrance Stairs-Rails: Right ?Entrance Stairs-Number of Steps: 3-4 ?Alternate Level Stairs-Number of Steps: flight ?Home Layout: Two level;Able to live on main level with bedroom/bathroom ?Home Equipment: Kasandra Knudsen - single point ?   ?  ?Prior Function Prior Level of Function : Needs assist;Patient poor historian/Family not available ?  ?  ?  ?  ?  ?  ?Mobility Comments: modified independent with cane. ?  ?  ? ? ?Hand Dominance  ?   ? ?  ?Extremity/Trunk Assessment  ? Upper Extremity Assessment ?Upper Extremity Assessment: Overall WFL for tasks assessed ?  ? ?Lower Extremity Assessment ?Lower Extremity Assessment: Generalized weakness ?  ? ?   ?Communication  ? Communication: No difficulties  ?Cognition Arousal/Alertness: Awake/alert ?Behavior During Therapy: Flat affect ?Overall Cognitive Status: No family/caregiver present to determine baseline cognitive functioning ?Area of Impairment: Orientation, Memory, Following commands, Problem solving, Safety/judgement ?  ?  ?  ?  ?  ?  ?  ?  ?Orientation Level: Disoriented to, Place, Time, Situation ?  ?Memory: Decreased short-term memory ?Following Commands: Follows one step commands consistently, Follows one step commands with increased time ?Safety/Judgement: Decreased awareness of safety ?  ?Problem Solving: Slow processing, Decreased initiation, Requires verbal cues, Difficulty sequencing ?  ?  ?  ? ?  ?General Comments   ? ?  ?Exercises    ? ?Assessment/Plan  ?  ?PT Assessment Patient needs continued PT services  ?PT Problem List Decreased strength;Decreased activity tolerance;Decreased balance;Decreased mobility;Decreased cognition;Pain ? ?   ?  ?PT Treatment Interventions DME instruction;Gait training;Stair training;Functional mobility training;Therapeutic activities;Therapeutic exercise;Balance training;Patient/family education    ? ?PT Goals (Current goals can be found in the Care Plan section)  ?Acute Rehab PT Goals ?Patient Stated Goal: not stated ?PT Goal Formulation: With patient ?Time For Goal Achievement: 03/17/22 ?Potential to Achieve Goals: Good ? ?  ?Frequency Min 3X/week ?  ? ? ?Co-evaluation   ?  ?  ?  ?  ? ? ?  ?AM-PAC PT "6 Clicks" Mobility  ?Outcome Measure Help needed turning from your back to your side while in a flat bed without using bedrails?: A Little ?Help needed moving from lying on your back to sitting on the side of a flat bed without using bedrails?: A Little ?Help needed moving to and from a bed to a chair (including a wheelchair)?: A Little ?Help needed standing up from a chair using your arms (e.g., wheelchair or bedside chair)?: A Little ?Help needed to walk in hospital room?: A Little ?Help needed climbing 3-5 steps with a railing? : A Lot ?6 Click Score: 17 ? ?  ?End of Session Equipment Utilized During Treatment: Gait belt ?Activity Tolerance: Patient limited by pain ?Patient left: in bed ?Nurse Communication: Mobility status ?PT Visit Diagnosis: Other abnormalities of gait and mobility (R26.89);Pain;Muscle weakness (generalized) (M62.81) ?Pain - Right/Left: Left ?Pain - part of body: Ankle and joints of foot ?  ? ?Time: 9983-3825 ?PT Time Calculation (min) (ACUTE ONLY): 27 min ? ? ?Charges:   PT Evaluation ?$PT Eval Moderate Complexity: 1 Mod ?PT Treatments ?$Gait Training: 8-22 mins ?  ?   ? ? ?  Jahdai Padovano PT ?Acute Rehabilitation Services ?Office [phone] ? ? ?Shary Decamp [facility] ?03/10/2022, 11:24 AM ?

## 2022-03-10 NOTE — ED Notes (Signed)
Report given to [place], Therapist, sports. ?

## 2022-03-10 NOTE — Care Management Obs Status (Signed)
MEDICARE OBSERVATION STATUS NOTIFICATION ? ? ?Patient Details  ?Name: Bruce Sullivan ?MRN: [id number] ?Date of Birth: Apr 29, 1951 ? ? ?Medicare Observation Status Notification Given:  Yes ? ? ? ?Fuller Mandril, RN ?03/10/2022, 1:26 PM ?

## 2022-03-10 NOTE — Discharge Summary (Signed)
Physician Discharge Summary  ?Bruce Sullivan SWF:[id number] DOB: 01/10/1951 DOA: 03/09/2022 ? ?PCP: Wenda Low, MD ? ?Admit date: 03/09/2022 ?Discharge date: 03/10/2022 ? ?Admitted From: Home ?Disposition:  Home  ? ?Recommendations for Outpatient Follow-up:  ?Follow up with PCP in 1-2 weeks ?Please obtain BMP/CBC in one week ?Please follow up on the following pending results: final results of urine culture ? ?Home Health:YES ?Equipment/Devices:walker ? ?Discharge Condition:Stable ?CODE STATUS:FULL ?Diet recommendation: Heart Healthy ? ? ? ?Brief/Interim Summary: ? ?HPI done earlier today by DR Opyd : Bruce Sullivan is a pleasant 71 y.o. male with medical history significant for COPD, stage I adenocarcinoma of the left upper lobe status post SBRT, CKD stage II, depression, anxiety, meningioma, history of CVA, and cognitive deficit who presents to the emergency department with pain, redness, and swelling involving the left foot, inability to ambulate independently, and chest pain.  Patient reports being able to ambulate to the bathroom unassisted at roughly 3 AM on 03/09/2022, but was then unable to get out of bed without assistance at 8 AM.  He initially felt that his left leg "would not work," but later attributed his difficulty to left foot pain and swelling.  He sometimes uses a walker or cane at home but today required assistance from family to ambulate.  Ambulatory difficulty persisted throughout the day and then the patient had an episode of discomfort in the central chest with shortness of breath yesterday evening.  EMS was called, patient was treated with 2 doses of nitroglycerin and 324 mg of aspirin, and had resolution of the chest discomfort prior to arrival in the ED.  He denies any recent fevers or chills, denies change in his mild nonproductive cough, denies numbness or tingling, and denies any change in vision or headache. ?  ?ED Course: Upon arrival to the ED, patient is found to be afebrile, saturating 100%  on 2 L/min of supplemental oxygen, and mildly hypertensive.  EKG features sinus rhythm with PAC.  Chest x-ray negative for acute cardiopulmonary disease.  Plain radiographs of the left foot negative for acute osseous abnormality.  No acute intracranial abnormality noted on head CT.  Chemistry panel with creatinine 1.26.  CBC notable for mild leukocytosis.  Lactic acid is normal and troponin was normal x2.  Blood cultures were collected, patient was given a dose of Rocephin, and MRI brain was ordered but not yet performed. ?  ?  ?Left foot pain : ?-Pain most likely related to mild cellulitis, no evidence of systemic infections, no fever, no leukocytosis, he has mild erythema, but as well tender to palpation in the medial surface . ?-He has been treated with IV Rocephin during hospital stay, he received 1 dose yesterday, and will receive another dose today before discharge , blood cultures were sent as well. ?-Patient had some erythema and tenderness to palpation with concern of gouty flare as well, he received IV Solu-Medrol before discharge, as well received colchicine, he will be discharged on 5 days of colchicine 2. ? ?  ?Chest pain  ?- Patient had an episode of central chest discomfort yesterday evening; he was given NTG x2 and ASA 324 mg prior to arrival and has not had any pain since  ?- No acute ischemic findings noted on EKG, HS troponin is normal x2, and no acute findings on CXR  ?-Patient has been chest pain-free, no further work-up indicated currently. ?  ?Ambulatory dysfunction  ?- Patient unable to ambulate independently since the am of 03/09/22, initially reporting  that "legs wouldn't work" but later attributing to left foot pain   ?- There is no acute finding on head CT; ?-Was complaining of unsteady gait, mainly due to left foot pain, MRI brain was obtained overnight with no acute intracranial abnormality, no strong evidence of metastatic disease, in the absence of IV contrast, and known left middle  cranial fossa meningioma is poorly visible without contrast.MRI brain was ordered from ED but not yet performed  ?-PT consulted, will arrange for home health including PT/aide/visiting nurse. ?  ?COPD   ?- Continue Breztri and as-needed albuterol   ?  ?Hx of CVA ?- Continue ASA and Lipitor   ?  ?CKD II  ?- SCr is 1.26 on admission; baseline appears to be closer to 1  ?- Renally-dose medications, monitor  ?  ? ?Discharge Diagnoses:  ?Principal Problem: ?  Cellulitis of left foot ?Active Problems: ?  Chronic obstructive pulmonary disease ([place]) ?  History of CVA (cerebrovascular accident) ?  Stage 2 chronic kidney disease ?  Ambulatory dysfunction ?  Chest pain ? ? ? ?Discharge Instructions ? ?Discharge Instructions   ? ? Diet - low sodium heart healthy   Complete by: As directed ?  ? Increase activity slowly   Complete by: As directed ?  ? Follow with Primary MD Wenda Low, MD in 7 days  ? ?Get CBC, CMP,  checked  by Primary MD next visit.  ? ? ?Activity: As tolerated with Full fall precautions use walker/cane & assistance as needed ? ? ?Disposition Home  ? ? ?Diet: Heart Healthy  , with feeding assistance and aspiration precautions. ? ? ?On your next visit with your primary care physician please Get Medicines reviewed and adjusted. ? ? ?Please request your Prim.MD to go over all Hospital Tests and Procedure/Radiological results at the follow up, please get all Hospital records sent to your Prim MD by signing hospital release before you go home. ? ? ?If you experience worsening of your admission symptoms, develop shortness of breath, life threatening emergency, suicidal or homicidal thoughts you must seek medical attention immediately by calling 911 or calling your MD immediately  if symptoms less severe. ? ?You Must read complete instructions/literature along with all the possible adverse reactions/side effects for all the Medicines you take and that have been prescribed to you. Take any new Medicines after you  have completely understood and accpet all the possible adverse reactions/side effects.  ? ?Do not drive, operating heavy machinery, perform activities at heights, swimming or participation in water activities or provide baby sitting services if your were admitted for syncope or siezures until you have seen by Primary MD or a Neurologist and advised to do so again. ? ?Do not drive when taking Pain medications.  ? ? ?Do not take more than prescribed Pain, Sleep and Anxiety Medications ? ?Special Instructions: If you have smoked or chewed Tobacco  in the last 2 yrs please stop smoking, stop any regular Alcohol  and or any Recreational drug use. ? ?Wear Seat belts while driving. ? ? ?Please note ? ?You were cared for by a hospitalist during your hospital stay. If you have any questions about your discharge medications or the care you received while you were in the hospital after you are discharged, you can call the unit and asked to speak with the hospitalist on call if the hospitalist that took care of you is not available. Once you are discharged, your primary care physician will handle any further medical  issues. Please note that NO REFILLS for any discharge medications will be authorized once you are discharged, as it is imperative that you return to your primary care physician (or establish a relationship with a primary care physician if you do not have one) for your aftercare needs so that they can reassess your need for medications and monitor your lab values.  ? ?  ? ?Allergies as of 03/10/2022   ? ?   Reactions  ? Bee Venom Anaphylaxis  ? Face gets red and hard to breathe  ? ?  ? ?  ?Medication List  ?  ? ?STOP taking these medications   ? ?Breztri Aerosphere 160-9-4.8 MCG/ACT Aero ?Generic drug: Budeson-Glycopyrrol-Formoterol ?  ?fluticasone-salmeterol 250-50 MCG/ACT Aepb ?Commonly known as: Wixela Inhub ?  ?Spiriva Respimat 2.5 MCG/ACT Aers ?Generic drug: Tiotropium Bromide Monohydrate ?  ? ?  ? ?TAKE these  medications   ? ?acetaminophen 500 MG tablet ?Commonly known as: TYLENOL ?Take 500 mg by mouth 2 (two) times daily as needed for pain. ?  ?albuterol (2.5 MG/3ML) 0.083% nebulizer solution ?Commonly known as:

## 2022-03-10 NOTE — ED Notes (Signed)
Patient given warm blanket.

## 2022-03-10 NOTE — Discharge Instructions (Signed)
Follow with Primary MD Wenda Low, MD in 7 days  ? ?Get CBC, CMP,  checked  by Primary MD next visit.  ? ? ?Activity: As tolerated with Full fall precautions use walker/cane & assistance as needed ? ? ?Disposition Home  ? ? ?Diet: Heart Healthy  , with feeding assistance and aspiration precautions. ? ? ?On your next visit with your primary care physician please Get Medicines reviewed and adjusted. ? ? ?Please request your Prim.MD to go over all Hospital Tests and Procedure/Radiological results at the follow up, please get all Hospital records sent to your Prim MD by signing hospital release before you go home. ? ? ?If you experience worsening of your admission symptoms, develop shortness of breath, life threatening emergency, suicidal or homicidal thoughts you must seek medical attention immediately by calling 911 or calling your MD immediately  if symptoms less severe. ? ?You Must read complete instructions/literature along with all the possible adverse reactions/side effects for all the Medicines you take and that have been prescribed to you. Take any new Medicines after you have completely understood and accpet all the possible adverse reactions/side effects.  ? ?Do not drive, operating heavy machinery, perform activities at heights, swimming or participation in water activities or provide baby sitting services if your were admitted for syncope or siezures until you have seen by Primary MD or a Neurologist and advised to do so again. ? ?Do not drive when taking Pain medications.  ? ? ?Do not take more than prescribed Pain, Sleep and Anxiety Medications ? ?Special Instructions: If you have smoked or chewed Tobacco  in the last 2 yrs please stop smoking, stop any regular Alcohol  and or any Recreational drug use. ? ?Wear Seat belts while driving. ? ? ?Please note ? ?You were cared for by a hospitalist during your hospital stay. If you have any questions about your discharge medications or the care you received  while you were in the hospital after you are discharged, you can call the unit and asked to speak with the hospitalist on call if the hospitalist that took care of you is not available. Once you are discharged, your primary care physician will handle any further medical issues. Please note that NO REFILLS for any discharge medications will be authorized once you are discharged, as it is imperative that you return to your primary care physician (or establish a relationship with a primary care physician if you do not have one) for your aftercare needs so that they can reassess your need for medications and monitor your lab values.  ?

## 2022-03-13 LAB — URINE CULTURE: Culture: >=100000 — AB

## 2022-03-14 ENCOUNTER — Telehealth: Payer: Self-pay | Admitting: Emergency Medicine

## 2022-03-14 LAB — CULTURE, BLOOD (ROUTINE X 2): Culture: NO GROWTH

## 2022-03-14 NOTE — Telephone Encounter (Signed)
Post ED Visit - Positive Culture Follow-up ? ?Culture report reviewed by antimicrobial stewardship pharmacist: ?[facility] ?[]  Elenor Quinones, Pharm.D. ?[]  Heide Guile, Pharm.D., BCPS AQ-ID ?[]  Parks Neptune, Pharm.D., BCPS ?[]  Alycia Rossetti, Pharm.D., BCPS ?[]  Williamson, Pharm.D., BCPS, AAHIVP ?[]  Legrand Como, Pharm.D., BCPS, AAHIVP ?[]  Salome Arnt, PharmD, BCPS ?[]  Johnnette Gourd, PharmD, BCPS ?[]  Hughes Better, PharmD, BCPS ?[]  Leeroy Cha, PharmD ?[]  Laqueta Linden, PharmD, BCPS ?[]  Albertina Parr, PharmD ? ?[facility] ?[]  Leodis Sias, PharmD ?[]  Lindell Spar, PharmD ?[]  Royetta Asal, PharmD ?[]  Graylin Shiver, Rph ?[]  Rema Fendt) Glennon Mac, PharmD ?[]  Arlyn Dunning, PharmD ?[]  Netta Cedars, PharmD ?[]  Dia Sitter, PharmD ?[]  Leone Haven, PharmD ?[]  Gretta Arab, PharmD ?[]  Theodis Shove, PharmD ?[]  Peggyann Juba, PharmD ?[]  Reuel Boom, PharmD ? ? ?Positive urine culture ?Treated with cephalexin, organism sensitive to the same and no further patient follow-up is required at this time. ? ?Hazle Nordmann ?03/14/2022, 10:20 AM ?  ?

## 2022-03-15 LAB — CULTURE, BLOOD (ROUTINE X 2): Culture: NO GROWTH

## 2022-03-16 DIAGNOSIS — G40909 Epilepsy, unspecified, not intractable, without status epilepticus: Secondary | ICD-10-CM | POA: Diagnosis not present

## 2022-03-16 DIAGNOSIS — J449 Chronic obstructive pulmonary disease, unspecified: Secondary | ICD-10-CM | POA: Diagnosis not present

## 2022-03-16 DIAGNOSIS — Z0000 Encounter for general adult medical examination without abnormal findings: Secondary | ICD-10-CM | POA: Diagnosis not present

## 2022-03-16 DIAGNOSIS — I10 Essential (primary) hypertension: Secondary | ICD-10-CM | POA: Diagnosis not present

## 2022-03-16 DIAGNOSIS — F331 Major depressive disorder, recurrent, moderate: Secondary | ICD-10-CM | POA: Diagnosis not present

## 2022-03-16 DIAGNOSIS — F1021 Alcohol dependence, in remission: Secondary | ICD-10-CM | POA: Diagnosis not present

## 2022-03-16 DIAGNOSIS — I700 Atherosclerosis of aorta: Secondary | ICD-10-CM | POA: Diagnosis not present

## 2022-03-16 DIAGNOSIS — G459 Transient cerebral ischemic attack, unspecified: Secondary | ICD-10-CM | POA: Diagnosis not present

## 2022-03-16 DIAGNOSIS — F0150 Vascular dementia without behavioral disturbance: Secondary | ICD-10-CM | POA: Diagnosis not present

## 2022-03-16 DIAGNOSIS — B182 Chronic viral hepatitis C: Secondary | ICD-10-CM | POA: Diagnosis not present

## 2022-03-23 ENCOUNTER — Other Ambulatory Visit (HOSPITAL_COMMUNITY): Payer: Self-pay | Admitting: Neurosurgery

## 2022-03-23 DIAGNOSIS — M4712 Other spondylosis with myelopathy, cervical region: Secondary | ICD-10-CM

## 2022-04-04 ENCOUNTER — Ambulatory Visit (HOSPITAL_COMMUNITY)
Admission: RE | Admit: 2022-04-04 | Discharge: 2022-04-04 | Disposition: A | Discharge status: Home / Self Care | Payer: No Typology Code available for payment source | Source: Ambulatory Visit | Attending: Neurosurgery | Admitting: Neurosurgery

## 2022-04-04 DIAGNOSIS — M4712 Other spondylosis with myelopathy, cervical region: Secondary | ICD-10-CM | POA: Diagnosis not present

## 2022-04-04 IMAGING — MR MR CERVICAL SPINE W/O CM
4 of 6 series · 31 of 48 positions shown · non-contrast
Comparison: None Available.

CLINICAL DATA: Evaluate for cervical spondylosis myelopathy

EXAM:
MRI CERVICAL SPINE WITHOUT CONTRAST
TECHNIQUE: Multiplanar, multisequence MR imaging of the cervical spine was
performed. No intravenous contrast was administered.

[Series 9: T1 · sagittal · 3.0mm · 0.69mm/px · 5 of 15 slices shown (1 of 2)]
[im 1/15]
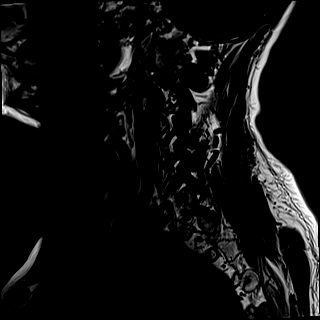
[im 4/15]
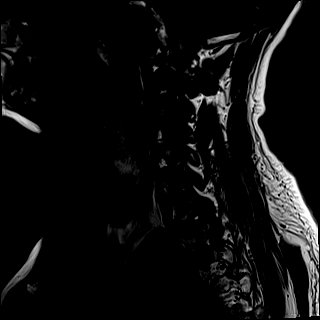
[im 8/15]
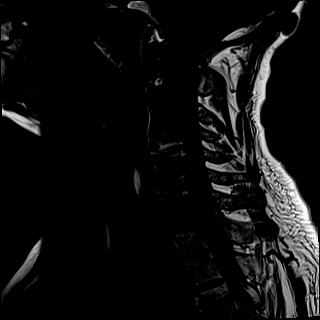
[im 11/15]
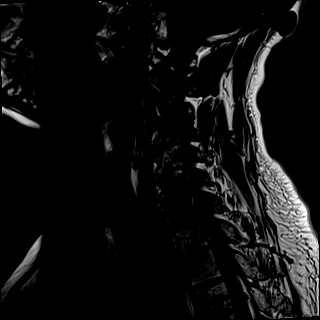
[im 15/15]
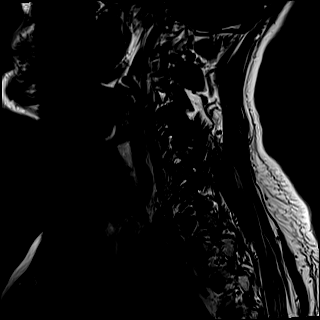

[Series 10: T2 · sagittal · 3.0mm · 0.69mm/px · 5 of 15 slices shown (1 of 2)]
[im 1/15]
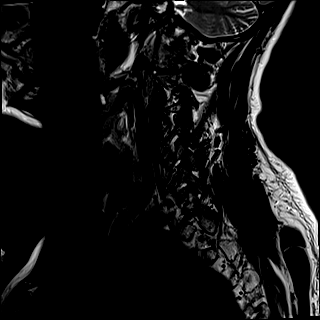
[im 4/15]
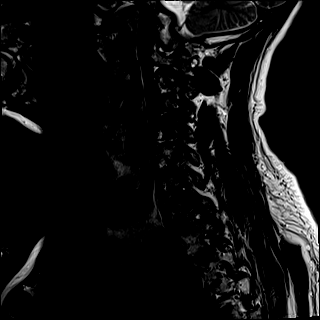
[im 8/15]
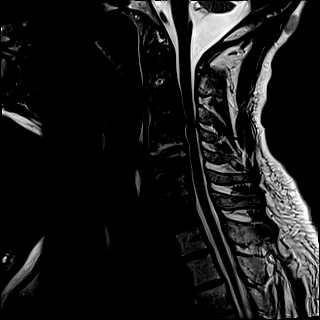
[im 11/15]
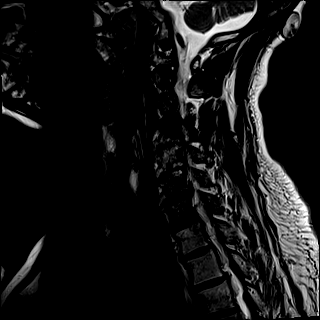
[im 15/15]
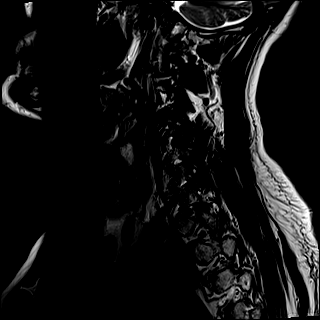

[Series 12: T2 · axial · 3.0mm · 0.70mm/px · z∈[-261,-164]mm · 11 of 30 slices shown (2 of 2)]
[im 1/30]
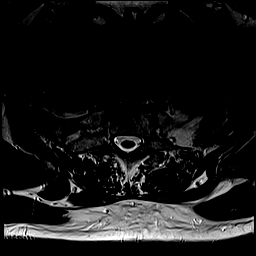
[im 3/30]
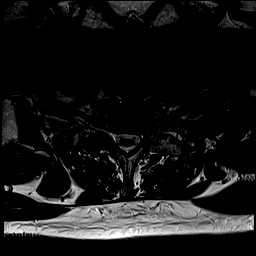
[im 6/30]
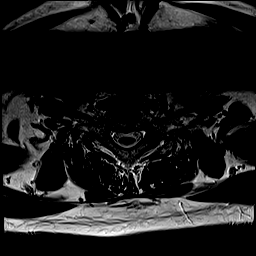
[im 9/30]
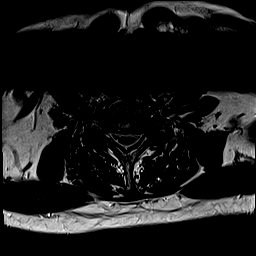
[im 12/30]
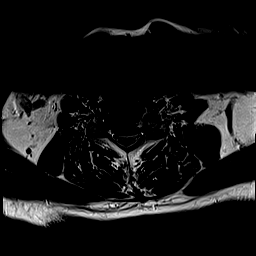
[im 15/30]
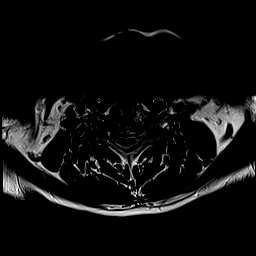
[im 18/30]
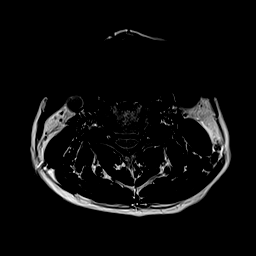
[im 21/30]
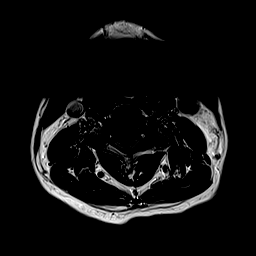
[im 24/30]
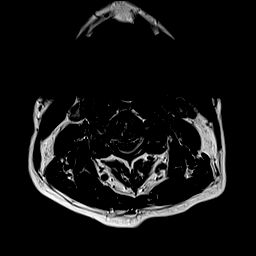
[im 27/30]
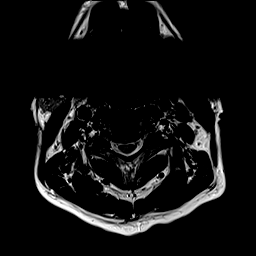
[im 30/30]
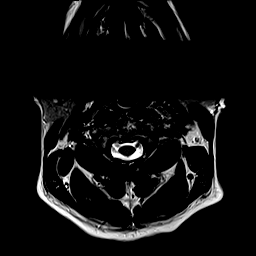

[Series 14: T1 · axial · 3.0mm · 0.35mm/px · z∈[-261,-174]mm · 10 of 30 slices shown (2 of 2)]
[im 1/30]
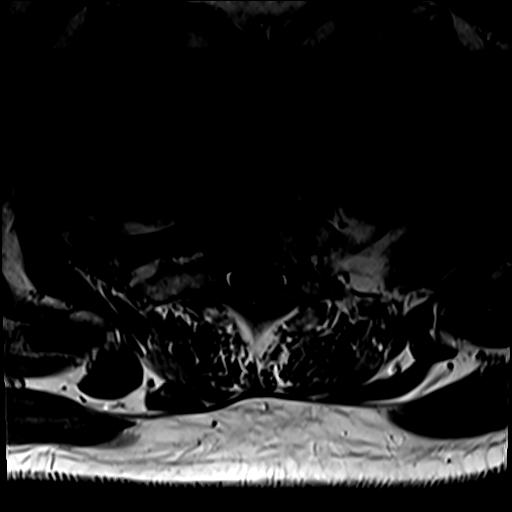
[im 3/30]
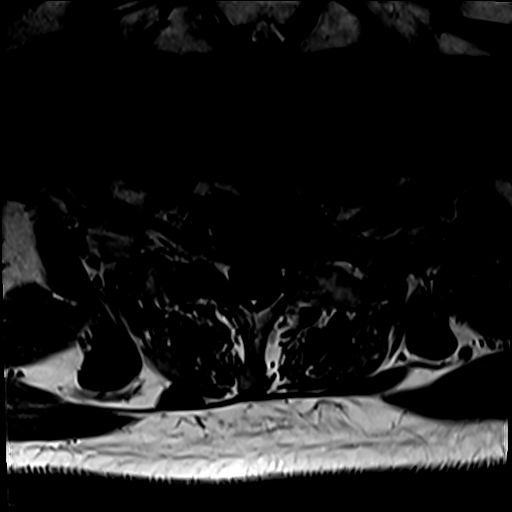
[im 6/30]
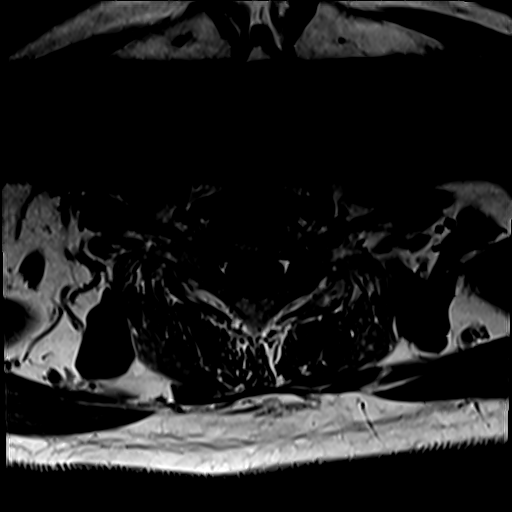
[im 9/30]
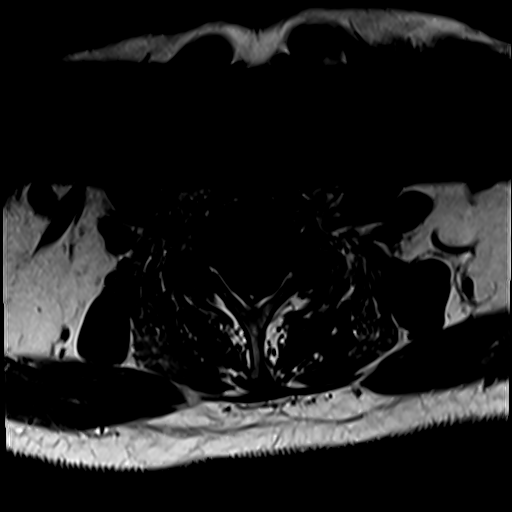
[im 12/30]
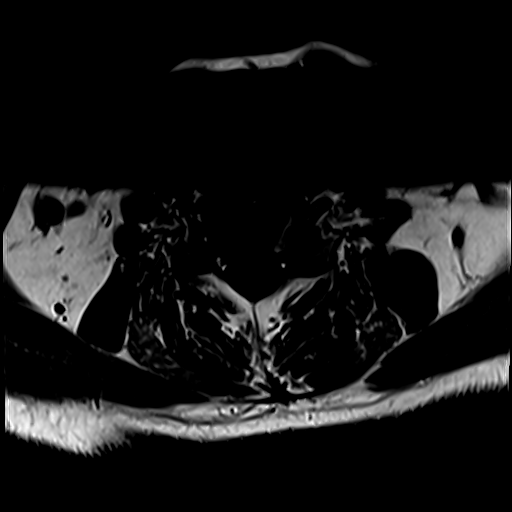
[im 15/30]
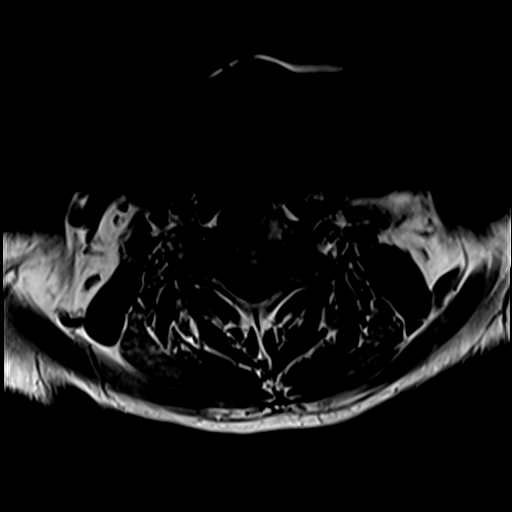
[im 18/30]
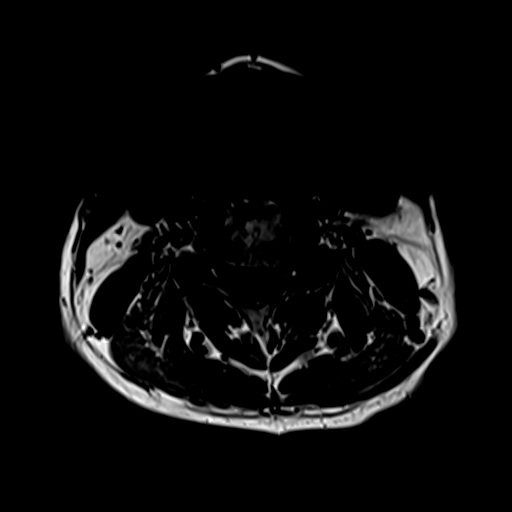
[im 21/30]
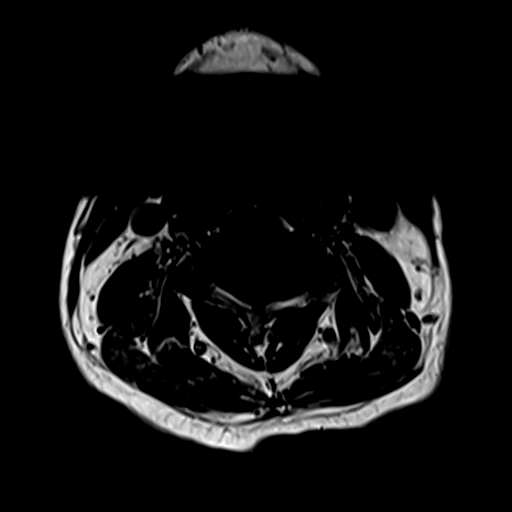
[im 24/30]
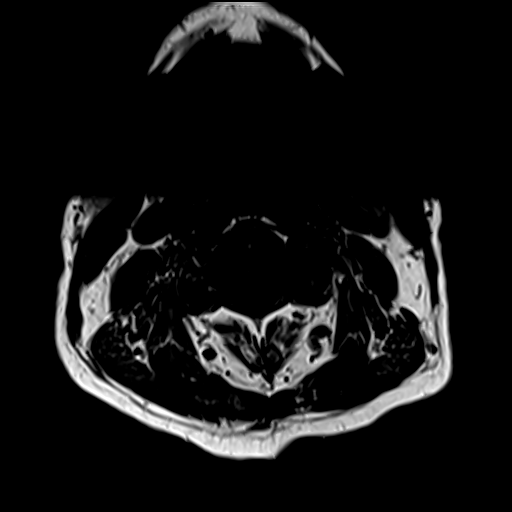
[im 27/30]
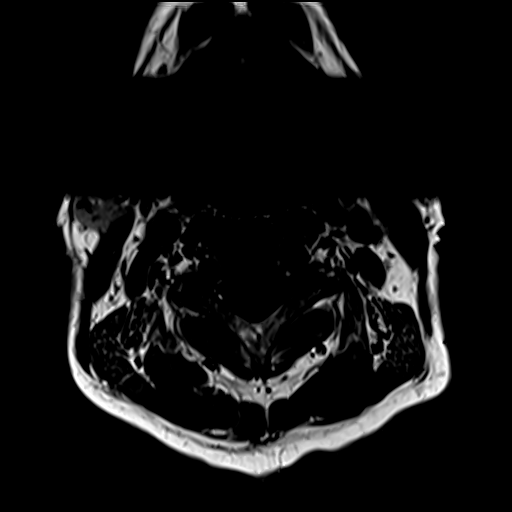

[31 of 48 positions shown; findings below may reference images not displayed]

FINDINGS: Alignment: Normal.

Vertebrae: Vertebral body heights are preserved. There is T1
hyperintensity in the vertebral bodies from T1 and below with
relative hypointensity from C2 through C7. Findings are favored to
reflect radiation changes at T1 and below. There is degenerative
endplate marrow signal abnormality at C4-C5. There is no suspicious
marrow signal abnormality or marrow edema.

Cord: Normal in signal and morphology.

Posterior Fossa, vertebral arteries, paraspinal tissues: The imaged
posterior fossa is unremarkable. The vertebral artery flow voids are
normal. The paraspinal soft tissues are unremarkable. The probable
sebaceous cyst in the suboccipital soft tissues is unchanged.

Disc levels:

There is multilevel disc desiccation and narrowing, most advanced at
C4-C5 through C6-C7.

C2-C3: There is mild uncovertebral and facet arthropathy without
significant spinal canal or neural foraminal stenosis.

C3-C4: There is a posterior disc osteophyte complex with a central
protrusion bilateral uncovertebral ridging, and bilateral facet
arthropathy resulting in severe spinal canal stenosis with
indentation of the ventral cord surface but no definite cord signal
abnormality, and severe right and moderate to severe left neural
foraminal stenosis

C4-C5: There is a posterior disc osteophyte complex bilateral
uncovertebral ridging, and bilateral facet arthropathy resulting in
moderate spinal canal stenosis and moderate to severe bilateral
neural foraminal stenosis

C5-C6: There is prominent bilateral uncovertebral ridging and
bilateral facet arthropathy resulting in severe left and
mild-to-moderate right neural foraminal stenosis without significant
spinal canal stenosis.

C6-C7: There is a central spur versus protrusion prominent
uncovertebral ridging, and bilateral facet arthropathy resulting in
severe left worse than right neural foraminal stenosis without
significant spinal canal stenosis.

C7-T1: No high-grade spinal canal or neural foraminal stenosis.
IMPRESSION: 1. T1 hyperintense marrow signal at T1 and below is favored to
reflect postradiation change. No definite discrete or suspicious
marrow signal abnormality.
2. At C3-C4, there is severe spinal canal stenosis with indentation
of the ventral cord surface but no definite cord signal abnormality
and severe right and moderate to severe left neural foraminal
stenosis.
3. At C4-C5, there is moderate spinal canal stenosis and moderate to
severe bilateral neural foraminal stenosis.
4. Severe left neural foraminal stenosis at C5-C6, and severe left
worse than right neural foraminal stenosis at C6-C7.

## 2022-05-08 ENCOUNTER — Other Ambulatory Visit (HOSPITAL_COMMUNITY): Payer: Self-pay

## 2022-05-17 ENCOUNTER — Ambulatory Visit (INDEPENDENT_AMBULATORY_CARE_PROVIDER_SITE_OTHER): Payer: Medicare Other

## 2022-05-17 ENCOUNTER — Ambulatory Visit (INDEPENDENT_AMBULATORY_CARE_PROVIDER_SITE_OTHER): Payer: Medicare Other | Admitting: Nurse Practitioner

## 2022-05-17 ENCOUNTER — Encounter: Payer: Self-pay | Admitting: Nurse Practitioner

## 2022-05-17 VITALS — BP 118/68 | HR 58 | Temp 99.0°F | Ht 69.0 in | Wt 171.0 lb

## 2022-05-17 DIAGNOSIS — J441 Chronic obstructive pulmonary disease with (acute) exacerbation: Secondary | ICD-10-CM

## 2022-05-17 DIAGNOSIS — J189 Pneumonia, unspecified organism: Secondary | ICD-10-CM

## 2022-05-17 DIAGNOSIS — C3412 Malignant neoplasm of upper lobe, left bronchus or lung: Secondary | ICD-10-CM

## 2022-05-17 MED ORDER — DOXYCYCLINE HYCLATE 100 MG PO TABS: 100.0000 mg | ORAL_TABLET | Freq: Two times a day (BID) | ORAL | 0 refills | Status: DC

## 2022-05-17 MED ORDER — AMOXICILLIN-POT CLAVULANATE 875-125 MG PO TABS: 1.0000 | ORAL_TABLET | Freq: Two times a day (BID) | ORAL | 0 refills | Status: AC

## 2022-05-17 MED ORDER — BREZTRI AEROSPHERE 160-9-4.8 MCG/ACT IN AERO: 2.0000 | INHALATION_SPRAY | Freq: Two times a day (BID) | RESPIRATORY_TRACT | 12 refills | Status: DC

## 2022-05-17 MED ORDER — TIOTROPIUM BROMIDE MONOHYDRATE 2.5 MCG/ACT IN AERS: INHALATION_SPRAY | RESPIRATORY_TRACT | 2 refills | Status: DC

## 2022-05-17 MED ORDER — PREDNISONE 20 MG PO TABS: 40.0000 mg | ORAL_TABLET | Freq: Every day | ORAL | 0 refills | Status: AC

## 2022-05-17 MED ORDER — AMOXICILLIN-POT CLAVULANATE 875-125 MG PO TABS: 1.0000 | ORAL_TABLET | Freq: Two times a day (BID) | ORAL | 0 refills | Status: DC

## 2022-05-17 MED ORDER — GUAIFENESIN ER 600 MG PO TB12: 600.0000 mg | ORAL_TABLET | Freq: Two times a day (BID) | ORAL | 1 refills | Status: DC

## 2022-05-17 MED ORDER — BREZTRI AEROSPHERE 160-9-4.8 MCG/ACT IN AERO: 2.0000 | INHALATION_SPRAY | Freq: Two times a day (BID) | RESPIRATORY_TRACT | 0 refills | Status: DC

## 2022-05-17 MED ORDER — PREDNISONE 20 MG PO TABS: 40.0000 mg | ORAL_TABLET | Freq: Every day | ORAL | 0 refills | Status: DC

## 2022-05-17 NOTE — Patient Instructions (Addendum)
Stop Wixela inhaler. Start Breztri 2 puffs Twice daily. Brush tongue and rinse mouth afterwards  Continue Albuterol inhaler 2 puffs or 3 mL neb every 6 hours as needed for shortness of breath or wheezing. Notify if symptoms persist despite rescue inhaler/neb use.  Continue omeprazole 40 mg daily   Mucinex 600 mg Twice daily for chest congestion Prednisone 40 mg for 5 days. Take in AM with food Doxycycline 1 tab Twice daily for 7 days. Take with food. Avoid excessive sun exposure and wear sunscreen while taking.  Flutter valve 2-3 times a day  Chest x ray.   Follow up in 2 weeks with Dr. Valeta Harms or Alanson Aly. If symptoms do not improve or worsen, please contact office for sooner follow up or seek emergency care.

## 2022-05-17 NOTE — Progress Notes (Signed)
Rx canceled for Doxycycline per [place] at [address].

## 2022-05-17 NOTE — Progress Notes (Signed)
ATC x1, left detailed message per [facility] regarding recommendations from [facility].  Advised they could call in the morning after 8 am with any questions.

## 2022-05-17 NOTE — Progress Notes (Unsigned)
@Patient  ID: Bruce Sullivan, male    DOB: 1951/06/23, 71 y.o.   MRN: [id number]  Chief Complaint  Patient presents with   Follow-up    He is having some shortness of breath at rest and with exertion, x 1 month, some chest tightness and wheezing.     Referring provider: Wenda Low, MD  HPI: 71 year old male, former smoker followed for COPD with emphysema and adenocarcinoma of the left lung status post SBRT.  He is a patient of Dr. Juline Patch and last seen in office 01/20/2022.  Past medical history significant for CKD, hypertension, HLD, TIA, stroke, GERD, seizure disorder, BPH, GAD, meningioma.   TEST/EVENTS:  02/10/2022 CT chest w contrast: atherosclerosis. Loop recorder in place. Right infrahilar lymph node 0.9, not hypermetabolic on previous PET and relatively unchanged in size. The medial LUL nodule measures 1x0.6 cm formerly 1.7x0.9 cm. Adjacent interstitial accentuation and hazy ground-glass opacity compatible with SBRT. Stable 5x3 mm nodule in the LUL.   01/20/2022: OV with Dr. Valeta Harms.  Follow-up for COPD.  He was previously diagnosed with adenocarcinoma of the left lung status post SBRT.  During work-up, he was found to have a brain mass concerning for meningioma.  He was seen by neurosurgery who need his loop recorder removed so they can obtain MRI of the brain.  He is awaiting to see cardiology at the [facility] for this.  From a respiratory standpoint he is able to do most of his activities of daily living.  He does walk slower and with a cane.  He does have some shortness of breath.  His wife is concerned but he feels like he has been stable.  He did well with the samples of Breztri and would like to stay on it if possible.  Advised to follow-up in 6 months.  05/17/2022: Today- Patient presents today with his wife for increased shortness of breath over the last month.  She helps to provide his history. He has been coughing more recently and producing mucus, but she is not entirely sure what color  it is. He also has some chest congestion/tightness and associated wheezing when he gets short of breath. He is feeling more tired and weaker than normal. Eating and drinking well though. They deny any recent fevers, chills, night sweats, hemoptysis, weight loss, chest pain, leg swelling. He has not been using a maintenance inhaler regularly and they are a little unclear on which inhalers he has at home. After investigation, discovered he only has Wixela at home. He previously was on [place] and doing better, but he ran out of refills so his [place] doctor switched him. He uses his rescue albuterol inhaler or neb multiple times a day.   Allergies  Allergen Reactions   Bee Venom Anaphylaxis    Face gets red and hard to breathe     Immunization History  Administered Date(s) Administered   Fluad Quad(high Dose 65+) 10/14/2020, 09/12/2021   Influenza Split 08/25/2014, 08/07/2019   Influenza, High Dose Seasonal PF 08/02/2018, 09/12/2021   Influenza, Seasonal, Injecte, Preservative Fre 08/25/2014   Influenza-Unspecified 08/25/2014, 08/28/2014, 10/04/2015, 08/07/2019   Moderna Sars-Covid-2 Vaccination 01/24/2020, 02/21/2020   Pneumococcal Conjugate-13 06/05/2017, 12/13/2017   Pneumococcal Polysaccharide-23 11/06/2012, 04/10/2015, 04/19/2015   Tdap 07/24/2014   Zoster Recombinat (Shingrix) 05/03/2021, 03/02/2022   Zoster, Live 03/02/2022    Past Medical History:  Diagnosis Date   Anxiety    Arthritis    Asthma    Atherosclerosis 01/16/2015   Noted on CXR   Bilateral  lower extremity edema 01/16/2015   Chronic kidney disease (CKD), stage II (mild)    COPD (chronic obstructive pulmonary disease) (HCC)    Dementia (HCC)    Depression    Dyspnea    GERD (gastroesophageal reflux disease)    Gout    Hepatitis 12/2019   Hx Hep C positive in blood. Treated.   History of colon polyps    Hoarseness    Hyperlipidemia    Hypertension    Lung cancer ([place]) 09/07/2021   Neck abscess 09/03/2016    Pre-diabetes    Pulmonary emphysema ([place])    Seizure ([place])    Stroke ([place]) 2014   Thyroid disease    TIA (transient ischemic attack)    Urinary frequency    Urine retention    UTI (urinary tract infection)     Tobacco History: Social History   Tobacco Use  Smoking Status Former   Types: E-cigarettes   Quit date: 01/2014   Years since quitting: 8.3  Smokeless Tobacco Never   Counseling given: Not Answered   Outpatient Medications Prior to Visit  Medication Sig Dispense Refill   Tiotropium Bromide Monohydrate 2.5 MCG/ACT AERS INHALE 2 PUFFS BY MOUTH DAILY **CONTACT COMMUNITY CARE PROVIDER FOR MORE REFILLS**     acetaminophen (TYLENOL) 500 MG tablet Take 500 mg by mouth 2 (two) times daily as needed for pain.     albuterol (PROVENTIL) (2.5 MG/3ML) 0.083% nebulizer solution Take 3 mLs (2.5 mg total) by nebulization every 6 (six) hours as needed for wheezing or shortness of breath. 75 mL 12   allopurinol (ZYLOPRIM) 100 MG tablet Take 100 mg by mouth 2 (two) times daily.     amLODipine (NORVASC) 10 MG tablet Take 10 mg by mouth daily.     atorvastatin (LIPITOR) 10 MG tablet Take 10 mg by mouth at bedtime.     chlorthalidone (HYGROTON) 50 MG tablet Take 50 mg by mouth daily.     Cholecalciferol 50 MCG (2000 UT) TABS Take 2,000 Units by mouth daily.     colchicine 0.6 MG tablet Take 1 tablet (0.6 mg total) by mouth daily for 5 days. 5 tablet 0   FLUoxetine (PROZAC) 20 MG capsule Take 20 mg by mouth daily.     levETIRAcetam (KEPPRA) 500 MG tablet Take 500 mg by mouth 2 (two) times daily.     omeprazole (PRILOSEC) 40 MG capsule Take 40 mg by mouth daily.     ondansetron (ZOFRAN-ODT) 4 MG disintegrating tablet Take 1 tablet (4 mg total) by mouth every 8 (eight) hours as needed for nausea. 10 tablet 0   aspirin EC 81 MG tablet Take 81 mg by mouth daily. Swallow whole. (Patient not taking: Reported on 09/12/2021)     Fluticasone-Umeclidin-Vilant (TRELEGY ELLIPTA) 100-62.5-25 MCG/ACT AEPB  Inhale 1 puff into the lungs daily. 14 each 0   No facility-administered medications prior to visit.     Review of Systems:   Constitutional: No weight loss or gain, night sweats, fevers, chills +fatigue, lassitude. HEENT: No headaches, difficulty swallowing, tooth/dental problems, or sore throat. No sneezing, itching, ear ache, nasal congestion, or post nasal drip CV:  No chest pain, orthopnea, PND, swelling in lower extremities, anasarca, dizziness, palpitations, syncope Resp: +shortness of breath with exertion, rarely at rest; productive cough; chest congestion; wheezing. No hemoptysis.  No chest wall deformity GI:  No heartburn, indigestion, abdominal pain, nausea, vomiting, diarrhea, change in bowel habits, loss of appetite, bloody stools.  Skin: No rash, lesions, ulcerations MSK:  No joint pain or swelling.  No decreased range of motion.  No back pain. Neuro: No dizziness or lightheadedness.  Psych: No depression or anxiety. Mood stable.     Physical Exam:  BP 118/68 (BP Location: Left Arm, Cuff Size: Normal)   Pulse (!) 58   Temp 99 F (37.2 C) (Oral)   Ht 5\' 9"  (1.753 m)   Wt 171 lb (77.6 kg)   SpO2 98%   BMI 25.25 kg/m   GEN: Pleasant, interactive, chronically ill appearing; in no acute distress. HEENT:  Normocephalic and atraumatic. PERRLA. Sclera white. Nasal turbinates pink, moist and patent bilaterally. No rhinorrhea present. Oropharynx pink and moist, without exudate or edema. No lesions, ulcerations, or postnasal drip.  NECK:  Supple w/ fair ROM. No JVD present. Normal carotid impulses w/o bruits. Thyroid symmetrical with no goiter or nodules palpated. No lymphadenopathy.   CV: RRR, no m/r/g, no peripheral edema. Pulses intact, +2 bilaterally. No cyanosis, pallor or clubbing. PULMONARY:  Unlabored, regular breathing. Scattered rhonchi bilaterally A&P. No accessory muscle use. No dullness to percussion. GI: BS present and normoactive. Soft, non-tender to palpation.  No organomegaly or masses detected. No CVA tenderness. MSK: No erythema, warmth or tenderness. Cap refil <2 sec all extrem. No deformities or joint swelling noted.  Neuro: A/Ox3. No focal deficits noted.   Skin: Warm, no lesions or rashe Psych: Normal affect and behavior. Judgement and thought content appropriate.     Lab Results:  CBC    Component Value Date/Time   WBC 6.9 03/10/2022 0344   RBC 3.96 (L) 03/10/2022 0344   HGB 12.4 (L) 03/10/2022 0344   HCT 36.2 (L) 03/10/2022 0344   PLT 253 03/10/2022 0344   MCV 91.4 03/10/2022 0344   MCH 31.3 03/10/2022 0344   MCHC 34.3 03/10/2022 0344   RDW 12.0 03/10/2022 0344   LYMPHSABS 2.4 12/23/2021 1028   MONOABS 0.5 12/23/2021 1028   EOSABS 0.1 12/23/2021 1028   BASOSABS 0.0 12/23/2021 1028    BMET    Component Value Date/Time   NA 137 03/10/2022 0344   K 3.6 03/10/2022 0344   CL 104 03/10/2022 0344   CO2 24 03/10/2022 0344   GLUCOSE 124 (H) 03/10/2022 0344   BUN 19 03/10/2022 0344   CREATININE 1.14 03/10/2022 0344   CALCIUM 9.1 03/10/2022 0344   GFRNONAA >60 03/10/2022 0344   GFRAA >60 09/16/2018 1255    BNP    Component Value Date/Time   BNP 24.4 12/27/2020 0825     Imaging:  DG Chest 2 View  Result Date: 05/17/2022 CLINICAL DATA:  Productive cough EXAM: CHEST - 2 VIEW COMPARISON:  03/09/2022 FINDINGS: Patchy airspace disease in the lower lungs suspicious for pneumonia. No pleural effusion. Normal cardiac size. IMPRESSION: Patchy airspace disease in the lower lungs suggestive of pneumonia. Radiographic follow-up in 4-6 weeks is recommended following antibiotic trial. Electronically Signed   By: Donavan Foil M.D.   On: 05/17/2022 17:17         Latest Ref Rng & Units 07/13/2021    2:40 PM  PFT Results  FVC-Pre L 2.09   FVC-Predicted Pre % 56   FVC-Post L 2.62   FVC-Predicted Post % 70   Pre FEV1/FVC % % 43   Post FEV1/FCV % % 50   FEV1-Pre L 0.89   FEV1-Predicted Pre % 31   FEV1-Post L 1.31   DLCO  uncorrected ml/min/mmHg 13.99   DLCO UNC% % 55   DLCO corrected ml/min/mmHg 14.75  DLCO COR %Predicted % 58   DLVA Predicted % 79   TLC L 7.44   TLC % Predicted % 109   RV % Predicted % 224     No results found for: "NITRICOXIDE"      Assessment & Plan:   COPD with acute exacerbation (HCC) Progressive disease vs acute exacerbation being off maintenance therapies. Non-toxic appearing and VS stable. This is his first exacerbation in quite some time. We will treat him with prednisone burst and empiric doxycycline course. I am going to restart him on triple therapy with Breztri, as he had good response to it in the past. Provided with samples today and new rx sent. We discussed the importance of compliance with maintenance inhaler and the purpose of his rescue inhaler. They verbalized understanding. Continue PRN albuterol. CXR to evaluate for superimposed infection. Close follow up.  Patient Instructions  Stop Wixela inhaler. Start Breztri 2 puffs Twice daily. Brush tongue and rinse mouth afterwards  Continue Albuterol inhaler 2 puffs or 3 mL neb every 6 hours as needed for shortness of breath or wheezing. Notify if symptoms persist despite rescue inhaler/neb use.  Continue omeprazole 40 mg daily   Mucinex 600 mg Twice daily for chest congestion Prednisone 40 mg for 5 days. Take in AM with food Doxycycline 1 tab Twice daily for 7 days. Take with food. Avoid excessive sun exposure and wear sunscreen while taking.  Flutter valve 2-3 times a day  Chest x ray.   Follow up in 2 weeks with Dr. Valeta Harms or Alanson Aly. If symptoms do not improve or worsen, please contact office for sooner follow up or seek emergency care.     Malignant neoplasm of bronchus of left upper lobe (HCC) Status post SBRT. CT chest from April 2023 stable with substantial reduction in LUL nodule. Follow up with radiation oncology as scheduled.    I spent 35 minutes of dedicated to the care of this patient on the  date of this encounter to include pre-visit review of records, face-to-face time with the patient discussing conditions above, post visit ordering of testing, clinical documentation with the electronic health record, making appropriate referrals as documented, and communicating necessary findings to members of the patients care team.  Clayton Bibles, NP 05/18/2022  Pt aware and understands NP's role.

## 2022-05-17 NOTE — Progress Notes (Signed)
Please notify patient that his chest x ray shows findings concerning for pneumonia. I am going to switch him from doxycycline to augmentin. Please cancel doxy at pharmacy. Advised patient to start augmentin 1 tab Twice daily for 7 days. Take with food. Take a daily probiotic (otc) or eat yogurt while taking. I have sent this to his pharmacy. Continue other recommendations at [facility]. If he develops worsening respiratory symptoms, needs to go to the ED. If no improvement over the next few days, please advise them to call back for sooner OV. Thanks!

## 2022-05-18 ENCOUNTER — Encounter: Payer: Self-pay | Admitting: Nurse Practitioner

## 2022-05-18 NOTE — Assessment & Plan Note (Signed)
Status post SBRT. CT chest from April 2023 stable with substantial reduction in LUL nodule. Follow up with radiation oncology as scheduled.

## 2022-05-18 NOTE — Assessment & Plan Note (Addendum)
Progressive disease vs acute exacerbation being off maintenance therapies. Non-toxic appearing and VS stable. This is his first exacerbation in quite some time. We will treat him with prednisone burst and empiric doxycycline course. I am going to restart him on triple therapy with Breztri, as he had good response to it in the past. Provided with samples today and new rx sent. We discussed the importance of compliance with maintenance inhaler and the purpose of his rescue inhaler. They verbalized understanding. Continue PRN albuterol. CXR to evaluate for superimposed infection. Close follow up.  Patient Instructions  Stop Wixela inhaler. Start Breztri 2 puffs Twice daily. Brush tongue and rinse mouth afterwards  Continue Albuterol inhaler 2 puffs or 3 mL neb every 6 hours as needed for shortness of breath or wheezing. Notify if symptoms persist despite rescue inhaler/neb use.  Continue omeprazole 40 mg daily   Mucinex 600 mg Twice daily for chest congestion Prednisone 40 mg for 5 days. Take in AM with food Doxycycline 1 tab Twice daily for 7 days. Take with food. Avoid excessive sun exposure and wear sunscreen while taking.  Flutter valve 2-3 times a day  Chest x ray.   Follow up in 2 weeks with Dr. Valeta Harms or Alanson Aly. If symptoms do not improve or worsen, please contact office for sooner follow up or seek emergency care.

## 2022-05-18 NOTE — Progress Notes (Signed)
Addendum 05/17/2022: CXR showed patchy airspace disease in bilateral lower lungs, concerning for pneumonia. Changed doxycycline to augmentin course. Pt made aware and pharmacy contacted to cancel doxy rx. Close follow up was scheduled in 2 weeks. Provided with ED precautions. Advised to monitor O2 at home for goal >88-90%.

## 2022-05-19 ENCOUNTER — Telehealth: Payer: Self-pay | Admitting: Nurse Practitioner

## 2022-05-19 NOTE — Telephone Encounter (Signed)
Called [name] back to go over chest xray results. Wife voiced understanding. Nothing further needed

## 2022-05-23 ENCOUNTER — Other Ambulatory Visit (HOSPITAL_COMMUNITY): Payer: Self-pay

## 2022-06-01 ENCOUNTER — Encounter: Payer: Self-pay | Admitting: Nurse Practitioner

## 2022-06-01 ENCOUNTER — Ambulatory Visit (INDEPENDENT_AMBULATORY_CARE_PROVIDER_SITE_OTHER): Payer: Medicare Other | Admitting: Nurse Practitioner

## 2022-06-01 VITALS — BP 126/76 | HR 76 | Temp 98.4°F | Ht 69.0 in | Wt 172.6 lb

## 2022-06-01 DIAGNOSIS — C3412 Malignant neoplasm of upper lobe, left bronchus or lung: Secondary | ICD-10-CM

## 2022-06-01 DIAGNOSIS — J441 Chronic obstructive pulmonary disease with (acute) exacerbation: Secondary | ICD-10-CM | POA: Diagnosis not present

## 2022-06-01 DIAGNOSIS — J189 Pneumonia, unspecified organism: Secondary | ICD-10-CM

## 2022-06-01 MED ORDER — PREDNISONE 10 MG PO TABS: ORAL_TABLET | ORAL | 0 refills | Status: DC

## 2022-06-01 NOTE — Assessment & Plan Note (Signed)
Status post SBRT. CT chest from April 2023 stable with substantial reduction in LUL nodule. Follow up with radiation oncology as scheduled.

## 2022-06-01 NOTE — Progress Notes (Signed)
@Patient  ID: Bruce Sullivan, male    DOB: 10-Jul-1951, 71 y.o.   MRN: [id number]  Chief Complaint  Patient presents with   Follow-up    Patient still has a little cough.     Referring provider: Wenda Low, MD  HPI: 71 year old male, former smoker followed for COPD with emphysema and adenocarcinoma of the left lung status post SBRT.  He is a patient of Dr. Juline Patch and last seen in office 01/20/2022.  Past medical history significant for CKD, hypertension, HLD, TIA, stroke, GERD, seizure disorder, BPH, GAD, meningioma.   TEST/EVENTS:  02/10/2022 CT chest w contrast: atherosclerosis. Loop recorder in place. Right infrahilar lymph node 0.9, not hypermetabolic on previous PET and relatively unchanged in size. The medial LUL nodule measures 1x0.6 cm formerly 1.7x0.9 cm. Adjacent interstitial accentuation and hazy ground-glass opacity compatible with SBRT. Stable 5x3 mm nodule in the LUL.   01/20/2022: OV with Dr. Valeta Harms.  Follow-up for COPD.  He was previously diagnosed with adenocarcinoma of the left lung status post SBRT.  During work-up, he was found to have a brain mass concerning for meningioma.  He was seen by neurosurgery who need his loop recorder removed so they can obtain MRI of the brain.  He is awaiting to see cardiology at the [facility] for this.  From a respiratory standpoint he is able to do most of his activities of daily living.  He does walk slower and with a cane.  He does have some shortness of breath.  His wife is concerned but he feels like he has been stable.  He did well with the samples of Breztri and would like to stay on it if possible.  Advised to follow-up in 6 months.  05/17/2022: OV with Teriyah Purington NP for increased shortness of breath over the last month.  She helps to provide his history. He has been coughing more recently and producing mucus, but she is not entirely sure what color it is. He also has some chest congestion/tightness and associated wheezing when he gets short of breath.  He is feeling more tired and weaker than normal. Eating and drinking well though. They deny any recent fevers, chills, night sweats, hemoptysis, weight loss, chest pain, leg swelling. He has not been using a maintenance inhaler regularly and they are a little unclear on which inhalers he has at home. After investigation, discovered he only has Wixela at home. He previously was on [place] and doing better, but he ran out of refills so his [place] doctor switched him. He uses his rescue albuterol inhaler or neb multiple times a day. CXR revealed patchy infiltrates, consistent with pneumonia - treated with augmentin course and prednisone burst for AECOPD. Stopped Wixela and stepped up to [employer]. Close follow up.  06/01/2022: Today - follow up Patient presents today with his wife for follow up after being treated for pneumonia and AECOPD. She again helps to provide history. His cough has improved and is no longer productive. He is also feeling better and energy is better. He is eating and drinking as normal again. Unfortunately, his breathing is minimally improved. He felt better on the prednisone but since completing, feels like his breathing is worse. He gets winded even just walking to the bathroom. Uses his albuterol nebulizer every hour while awake, per his wife. He also uses his albuterol rescue inhaler throughout the day. Denies any hemoptysis, weight loss, anorexia, leg swelling, fevers, night sweats. He is using Breztri Twice daily. He is still using mucinex Twice  daily. He was supposed to use flutter valve 2-3 times a day but his wife reports he hasn't been consistent with this.   Allergies  Allergen Reactions   Bee Venom Anaphylaxis    Face gets red and hard to breathe     Immunization History  Administered Date(s) Administered   Fluad Quad(high Dose 65+) 10/14/2020, 09/12/2021   Influenza Split 08/25/2014, 08/07/2019   Influenza, High Dose Seasonal PF 08/02/2018, 09/12/2021   Influenza, Seasonal,  Injecte, Preservative Fre 08/25/2014   Influenza-Unspecified 08/25/2014, 08/28/2014, 10/04/2015, 08/07/2019   Moderna Sars-Covid-2 Vaccination 01/24/2020, 02/21/2020   Pneumococcal Conjugate-13 06/05/2017, 12/13/2017   Pneumococcal Polysaccharide-23 11/06/2012, 04/10/2015, 04/19/2015   Tdap 07/24/2014   Zoster Recombinat (Shingrix) 05/03/2021, 03/02/2022   Zoster, Live 03/02/2022    Past Medical History:  Diagnosis Date   Anxiety    Arthritis    Asthma    Atherosclerosis 01/16/2015   Noted on CXR   Bilateral lower extremity edema 01/16/2015   Chronic kidney disease (CKD), stage II (mild)    COPD (chronic obstructive pulmonary disease) (HCC)    Dementia (HCC)    Depression    Dyspnea    GERD (gastroesophageal reflux disease)    Gout    Hepatitis 12/2019   Hx Hep C positive in blood. Treated.   History of colon polyps    Hoarseness    Hyperlipidemia    Hypertension    Lung cancer ([place]) 09/07/2021   Neck abscess 09/03/2016   Pre-diabetes    Pulmonary emphysema ([place])    Seizure ([place])    Stroke ([place]) 2014   Thyroid disease    TIA (transient ischemic attack)    Urinary frequency    Urine retention    UTI (urinary tract infection)     Tobacco History: Social History   Tobacco Use  Smoking Status Former   Types: E-cigarettes   Quit date: 01/2014   Years since quitting: 8.4  Smokeless Tobacco Never   Counseling given: Not Answered   Outpatient Medications Prior to Visit  Medication Sig Dispense Refill   acetaminophen (TYLENOL) 500 MG tablet Take 500 mg by mouth 2 (two) times daily as needed for pain.     albuterol (PROVENTIL) (2.5 MG/3ML) 0.083% nebulizer solution Take 3 mLs (2.5 mg total) by nebulization every 6 (six) hours as needed for wheezing or shortness of breath. 75 mL 12   allopurinol (ZYLOPRIM) 100 MG tablet Take 100 mg by mouth 2 (two) times daily.     amLODipine (NORVASC) 10 MG tablet Take 10 mg by mouth daily.     atorvastatin (LIPITOR) 10 MG tablet  Take 10 mg by mouth at bedtime.     Budeson-Glycopyrrol-Formoterol (BREZTRI AEROSPHERE) 160-9-4.8 MCG/ACT AERO Inhale 2 puffs into the lungs in the morning and at bedtime. 10.7 g 12   Budeson-Glycopyrrol-Formoterol (BREZTRI AEROSPHERE) 160-9-4.8 MCG/ACT AERO Inhale 2 puffs into the lungs in the morning and at bedtime. 10.7 g 0   chlorthalidone (HYGROTON) 50 MG tablet Take 50 mg by mouth daily.     Cholecalciferol 50 MCG (2000 UT) TABS Take 2,000 Units by mouth daily.     FLUoxetine (PROZAC) 20 MG capsule Take 20 mg by mouth daily.     guaiFENesin (MUCINEX) 600 MG 12 hr tablet Take 1 tablet (600 mg total) by mouth 2 (two) times daily. 60 tablet 1   levETIRAcetam (KEPPRA) 500 MG tablet Take 500 mg by mouth 2 (two) times daily.     omeprazole (PRILOSEC) 40 MG capsule Take 40 mg  by mouth daily.     ondansetron (ZOFRAN-ODT) 4 MG disintegrating tablet Take 1 tablet (4 mg total) by mouth every 8 (eight) hours as needed for nausea. 10 tablet 0   colchicine 0.6 MG tablet Take 1 tablet (0.6 mg total) by mouth daily for 5 days. 5 tablet 0   No facility-administered medications prior to visit.     Review of Systems:   Constitutional: No weight loss or gain, night sweats, fevers, chills, fatigue, lassitude  HEENT: No headaches, difficulty swallowing, tooth/dental problems, or sore throat. No sneezing, itching, ear ache, nasal congestion, or post nasal drip CV:  No chest pain, orthopnea, PND, swelling in lower extremities, anasarca, dizziness, palpitations, syncope Resp: +shortness of breath with exertion (unchanged); minimal cough, non productive; rare wheeze. No hemoptysis.  No chest wall deformity GI:  No heartburn, indigestion, abdominal pain, nausea, vomiting, diarrhea, change in bowel habits, loss of appetite, bloody stools.  Skin: No rash, lesions, ulcerations MSK:  No joint pain or swelling.  No decreased range of motion.  No back pain. Neuro: No dizziness or lightheadedness.  Psych: No  depression or anxiety. Mood stable.     Physical Exam:  BP 126/76 (BP Location: Right Arm, Patient Position: Sitting, Cuff Size: Large)   Pulse 76   Temp 98.4 F (36.9 C) (Oral)   Ht 5\' 9"  (1.753 m)   Wt 172 lb 9.6 oz (78.3 kg)   SpO2 96%   BMI 25.49 kg/m   GEN: Pleasant, interactive, chronically ill appearing; in no acute distress. HEENT:  Normocephalic and atraumatic. PERRLA. Sclera white. Nasal turbinates pink, moist and patent bilaterally. No rhinorrhea present. Oropharynx pink and moist, without exudate or edema. No lesions, ulcerations, or postnasal drip.  NECK:  Supple w/ fair ROM. No JVD present. Normal carotid impulses w/o bruits. Thyroid symmetrical with no goiter or nodules palpated. No lymphadenopathy.   CV: RRR, no m/r/g, no peripheral edema. Pulses intact, +2 bilaterally. No cyanosis, pallor or clubbing. PULMONARY:  Unlabored, regular breathing. Minimal end expiratory wheeze bilaterally posteriorly. No accessory muscle use. No dullness to percussion. GI: BS present and normoactive. Soft, non-tender to palpation. No organomegaly or masses detected. No CVA tenderness. MSK: No erythema, warmth or tenderness. Cap refil <2 sec all extrem. No deformities or joint swelling noted.  Neuro: A/Ox3. No focal deficits noted.   Skin: Warm, no lesions or rashe Psych: Normal affect and behavior. Judgement and thought content appropriate.     Lab Results:  CBC    Component Value Date/Time   WBC 6.9 03/10/2022 0344   RBC 3.96 (L) 03/10/2022 0344   HGB 12.4 (L) 03/10/2022 0344   HCT 36.2 (L) 03/10/2022 0344   PLT 253 03/10/2022 0344   MCV 91.4 03/10/2022 0344   MCH 31.3 03/10/2022 0344   MCHC 34.3 03/10/2022 0344   RDW 12.0 03/10/2022 0344   LYMPHSABS 2.4 12/23/2021 1028   MONOABS 0.5 12/23/2021 1028   EOSABS 0.1 12/23/2021 1028   BASOSABS 0.0 12/23/2021 1028    BMET    Component Value Date/Time   NA 137 03/10/2022 0344   K 3.6 03/10/2022 0344   CL 104 03/10/2022 0344    CO2 24 03/10/2022 0344   GLUCOSE 124 (H) 03/10/2022 0344   BUN 19 03/10/2022 0344   CREATININE 1.14 03/10/2022 0344   CALCIUM 9.1 03/10/2022 0344   GFRNONAA >60 03/10/2022 0344   GFRAA >60 09/16/2018 1255    BNP    Component Value Date/Time   BNP 24.4 12/27/2020 0825  Imaging:  DG Chest 2 View  Result Date: 05/17/2022 CLINICAL DATA:  Productive cough EXAM: CHEST - 2 VIEW COMPARISON:  03/09/2022 FINDINGS: Patchy airspace disease in the lower lungs suspicious for pneumonia. No pleural effusion. Normal cardiac size. IMPRESSION: Patchy airspace disease in the lower lungs suggestive of pneumonia. Radiographic follow-up in 4-6 weeks is recommended following antibiotic trial. Electronically Signed   By: Donavan Foil M.D.   On: 05/17/2022 17:17         Latest Ref Rng & Units 07/13/2021    2:40 PM  PFT Results  FVC-Pre L 2.09   FVC-Predicted Pre % 56   FVC-Post L 2.62   FVC-Predicted Post % 70   Pre FEV1/FVC % % 43   Post FEV1/FCV % % 50   FEV1-Pre L 0.89   FEV1-Predicted Pre % 31   FEV1-Post L 1.31   DLCO uncorrected ml/min/mmHg 13.99   DLCO UNC% % 55   DLCO corrected ml/min/mmHg 14.75   DLCO COR %Predicted % 58   DLVA Predicted % 79   TLC L 7.44   TLC % Predicted % 109   RV % Predicted % 224     No results found for: "NITRICOXIDE"      Assessment & Plan:   COPD with acute exacerbation ([place]) Unresolved AECOPD in setting of CAP. He has completed augmentin course with improvement in his cough and fatigue. Breathing is unchanged; better on prednisone. We will treat him for persistent AECOPD today with prednisone taper. Given his improvement in infectious symptoms, recommended holding off on further abx therapy. Reports compliance with maintenance therapies. He is using his albuterol far too much. Educated him on overuse and the negative impacts this can have. Verbalized understanding. We may need to consider transition to triple therapy nebs in the future if he  remains poorly controlled; continue Breztri in interim. Close follow up.   Patient Instructions  Continue Breztri 2 puffs Twice daily. Brush tongue and rinse mouth afterwards  Continue Albuterol inhaler 2 puffs or 3 mL neb every 6 hours as needed for shortness of breath or wheezing. Notify if symptoms persist despite rescue inhaler/neb use. Do not use your albuterol more than prescribed! Continue omeprazole 40 mg daily  Continue Mucinex 600 mg Twice daily for chest congestion Continue Flutter valve 2-3 times a day  Prednisone taper. 4 tabs for 2 days, then 3 tabs for 2 days, 2 tabs for 2 days, then 1 tab for 2 days, then stop. Take in AM with food   We will repeat chest x ray in 4 weeks    Follow up in 2 weeks with Dr. Valeta Harms or Joellen Jersey Sherin Murdoch,NP. If symptoms do not improve or worsen, please contact office for sooner follow up or seek emergency care.     Multifocal pneumonia Previous CXR with patchy bilateral infiltrates. Treated with augmentin 7 day course. Infectious symptoms have improved; non-toxic and VS stable. Dyspnea appears to be related to unresolved AECOPD. Provided with strict ED/return precautions. Plan for repeat CXR in 4 weeks unless symptoms fail to improve.   Malignant neoplasm of bronchus of left upper lobe (HCC) Status post SBRT. CT chest from April 2023 stable with substantial reduction in LUL nodule. Follow up with radiation oncology as scheduled.     I spent 35 minutes of dedicated to the care of this patient on the date of this encounter to include pre-visit review of records, face-to-face time with the patient discussing conditions above, post visit ordering of testing, clinical documentation  with the electronic health record, making appropriate referrals as documented, and communicating necessary findings to members of the patients care team.  Clayton Bibles, NP 06/01/2022  Pt aware and understands NP's role.

## 2022-06-01 NOTE — Assessment & Plan Note (Signed)
Previous CXR with patchy bilateral infiltrates. Treated with augmentin 7 day course. Infectious symptoms have improved; non-toxic and VS stable. Dyspnea appears to be related to unresolved AECOPD. Provided with strict ED/return precautions. Plan for repeat CXR in 4 weeks unless symptoms fail to improve.

## 2022-06-01 NOTE — Assessment & Plan Note (Signed)
Unresolved AECOPD in setting of CAP. He has completed augmentin course with improvement in his cough and fatigue. Breathing is unchanged; better on prednisone. We will treat him for persistent AECOPD today with prednisone taper. Given his improvement in infectious symptoms, recommended holding off on further abx therapy. Reports compliance with maintenance therapies. He is using his albuterol far too much. Educated him on overuse and the negative impacts this can have. Verbalized understanding. We may need to consider transition to triple therapy nebs in the future if he remains poorly controlled; continue Breztri in interim. Close follow up.   Patient Instructions  Continue Breztri 2 puffs Twice daily. Brush tongue and rinse mouth afterwards  Continue Albuterol inhaler 2 puffs or 3 mL neb every 6 hours as needed for shortness of breath or wheezing. Notify if symptoms persist despite rescue inhaler/neb use. Do not use your albuterol more than prescribed! Continue omeprazole 40 mg daily  Continue Mucinex 600 mg Twice daily for chest congestion Continue Flutter valve 2-3 times a day  Prednisone taper. 4 tabs for 2 days, then 3 tabs for 2 days, 2 tabs for 2 days, then 1 tab for 2 days, then stop. Take in AM with food   We will repeat chest x ray in 4 weeks    Follow up in 2 weeks with Dr. Valeta Harms or Joellen Jersey Hiilani Jetter,NP. If symptoms do not improve or worsen, please contact office for sooner follow up or seek emergency care.

## 2022-06-01 NOTE — Patient Instructions (Addendum)
Continue Breztri 2 puffs Twice daily. Brush tongue and rinse mouth afterwards  Continue Albuterol inhaler 2 puffs or 3 mL neb every 6 hours as needed for shortness of breath or wheezing. Notify if symptoms persist despite rescue inhaler/neb use. Do not use your albuterol more than prescribed! Continue omeprazole 40 mg daily  Continue Mucinex 600 mg Twice daily for chest congestion Continue Flutter valve 2-3 times a day  Prednisone taper. 4 tabs for 2 days, then 3 tabs for 2 days, 2 tabs for 2 days, then 1 tab for 2 days, then stop. Take in AM with food   We will repeat chest x ray in 4 weeks    Follow up in 2 weeks with Dr. Valeta Harms or Joellen Jersey Deshawn Witty,NP. If symptoms do not improve or worsen, please contact office for sooner follow up or seek emergency care.

## 2022-06-06 ENCOUNTER — Other Ambulatory Visit (HOSPITAL_COMMUNITY): Payer: Self-pay

## 2022-06-06 ENCOUNTER — Telehealth: Payer: Self-pay

## 2022-06-06 NOTE — Telephone Encounter (Addendum)
Patient Advocate Encounter   Received notification from [place] that prior authorization is required for [facility]. Faxed completed forms on 06/06/2022. Encounter will be updated if additional information is requested by [facility] office.  Clista Bernhardt, CPhT Rx Patient Advocate Specialist Phone: [phone]

## 2022-06-08 ENCOUNTER — Other Ambulatory Visit (HOSPITAL_COMMUNITY): Payer: Self-pay

## 2022-06-15 ENCOUNTER — Encounter: Payer: Self-pay | Admitting: Nurse Practitioner

## 2022-06-15 ENCOUNTER — Ambulatory Visit (INDEPENDENT_AMBULATORY_CARE_PROVIDER_SITE_OTHER): Payer: Medicare Other | Admitting: Nurse Practitioner

## 2022-06-15 DIAGNOSIS — C3412 Malignant neoplasm of upper lobe, left bronchus or lung: Secondary | ICD-10-CM

## 2022-06-15 DIAGNOSIS — J449 Chronic obstructive pulmonary disease, unspecified: Secondary | ICD-10-CM | POA: Diagnosis not present

## 2022-06-15 DIAGNOSIS — J189 Pneumonia, unspecified organism: Secondary | ICD-10-CM | POA: Diagnosis not present

## 2022-06-15 NOTE — Progress Notes (Signed)
@Patient  ID: Bruce Sullivan, male    DOB: 1951/07/18, 71 y.o.   MRN: [id number]  Chief Complaint  Patient presents with   Follow-up    2 wk f/u for COPD exacerbation. States he is feeling much better.     Referring provider: Wenda Low, MD  HPI: 71 year old male, former smoker followed for COPD with emphysema and adenocarcinoma of the left lung status post SBRT.  He is a patient of Dr. Juline Patch and last seen in office 06/01/2022 by [facility] NP.  Past medical history significant for CKD, hypertension, HLD, TIA, stroke, GERD, seizure disorder, BPH, GAD, meningioma.   TEST/EVENTS:  07/13/2021 PFTs: FVC 56, FEV1 31, ratio 50, TLC 109, DLCO corrected 58.  Very severe obstructive lung disease with significant bronchodilator response (47% change) 02/10/2022 CT chest w contrast: atherosclerosis. Loop recorder in place. Right infrahilar lymph node 0.9, not hypermetabolic on previous PET and relatively unchanged in size. The medial LUL nodule measures 1x0.6 cm formerly 1.7x0.9 cm. Adjacent interstitial accentuation and hazy ground-glass opacity compatible with SBRT. Stable 5x3 mm nodule in the LUL.  05/17/2022 CXR 2 view: Patchy airspace disease in the lower lungs, suggestive of pneumonia.  01/20/2022: OV with Dr. Valeta Harms.  Follow-up for COPD.  He was previously diagnosed with adenocarcinoma of the left lung status post SBRT.  During work-up, he was found to have a brain mass concerning for meningioma.  He was seen by neurosurgery who need his loop recorder removed so they can obtain MRI of the brain.  He is awaiting to see cardiology at the [facility] for this.  From a respiratory standpoint he is able to do most of his activities of daily living.  He does walk slower and with a cane.  He does have some shortness of breath.  His wife is concerned but he feels like he has been stable.  He did well with the samples of Breztri and would like to stay on it if possible.  Advised to follow-up in 6 months.  05/17/2022: OV with  Royston Bekele NP for increased shortness of breath over the last month.  She helps to provide his history. He has been coughing more recently and producing mucus, but she is not entirely sure what color it is. He also has some chest congestion/tightness and associated wheezing when he gets short of breath. He is feeling more tired and weaker than normal. Eating and drinking well though. They deny any recent fevers, chills, night sweats, hemoptysis, weight loss, chest pain, leg swelling. He has not been using a maintenance inhaler regularly and they are a little unclear on which inhalers he has at home. After investigation, discovered he only has Wixela at home. He previously was on [place] and doing better, but he ran out of refills so his [place] doctor switched him. He uses his rescue albuterol inhaler or neb multiple times a day. CXR revealed patchy infiltrates, consistent with pneumonia - treated with augmentin course and prednisone burst for AECOPD. Stopped Wixela and stepped up to [employer]. Close follow up.  06/01/2022: OV with Melbert Botelho NP for follow up after being treated for pneumonia and AECOPD. She again helps to provide history. His cough has improved and is no longer productive. He is also feeling better and energy is better. He is eating and drinking as normal again. Unfortunately, his breathing is minimally improved. He felt better on the prednisone but since completing, feels like his breathing is worse. He gets winded even just walking to the bathroom. Uses his  albuterol nebulizer every hour while awake, per his wife. He also uses his albuterol rescue inhaler throughout the day. He is using Breztri Twice daily. He is still using mucinex Twice daily. He was supposed to use flutter valve 2-3 times a day but his wife reports he hasn't been consistent with this. Treated for unresolved AECOPD with prednisone taper. Plan to repeat CXR in 4 weeks  06/16/2022: Today - follow up Patient presents today with wife for follow up  after being treated for persistent AECOPD after having pneumonia. He reports that he is feeling much better.  Feels like his breathing has improved after the last prednisone course.  He can walk to the bathroom and back again without feeling completely out of breath.  Cough has mostly resolved; occasionally will have some clear sputum.  No significant wheezing, chest congestion.  No recent fevers, chills.  Denies any hemoptysis, leg swelling, orthopnea, anorexia, weight loss.  He continues on [place] twice a day.  He does feel like this has helped.  He is using his albuterol a little less often.  However, his wife still reports that he uses his nebulizer frequently throughout the day.  She feels like it is a habit for him. Feels like it gives him more stamina.    Allergies  Allergen Reactions   Bee Venom Anaphylaxis    Face gets red and hard to breathe     Immunization History  Administered Date(s) Administered   Fluad Quad(high Dose 65+) 10/14/2020, 09/12/2021   Influenza Split 08/25/2014, 08/07/2019   Influenza, High Dose Seasonal PF 08/02/2018, 09/12/2021   Influenza, Seasonal, Injecte, Preservative Fre 08/25/2014   Influenza-Unspecified 08/25/2014, 08/28/2014, 10/04/2015, 08/07/2019   Moderna Sars-Covid-2 Vaccination 01/24/2020, 02/21/2020   Pneumococcal Conjugate-13 06/05/2017, 12/13/2017   Pneumococcal Polysaccharide-23 11/06/2012, 04/10/2015, 04/19/2015   Tdap 07/24/2014   Zoster Recombinat (Shingrix) 05/03/2021, 03/02/2022   Zoster, Live 03/02/2022    Past Medical History:  Diagnosis Date   Anxiety    Arthritis    Asthma    Atherosclerosis 01/16/2015   Noted on CXR   Bilateral lower extremity edema 01/16/2015   Chronic kidney disease (CKD), stage II (mild)    COPD (chronic obstructive pulmonary disease) (HCC)    Dementia (HCC)    Depression    Dyspnea    GERD (gastroesophageal reflux disease)    Gout    Hepatitis 12/2019   Hx Hep C positive in blood. Treated.    History of colon polyps    Hoarseness    Hyperlipidemia    Hypertension    Lung cancer ([place]) 09/07/2021   Neck abscess 09/03/2016   Pre-diabetes    Pulmonary emphysema ([place])    Seizure ([place])    Stroke ([place]) 2014   Thyroid disease    TIA (transient ischemic attack)    Urinary frequency    Urine retention    UTI (urinary tract infection)     Tobacco History: Social History   Tobacco Use  Smoking Status Former   Types: E-cigarettes   Quit date: 01/2014   Years since quitting: 8.4  Smokeless Tobacco Never   Counseling given: Not Answered   Outpatient Medications Prior to Visit  Medication Sig Dispense Refill   acetaminophen (TYLENOL) 500 MG tablet Take 500 mg by mouth 2 (two) times daily as needed for pain.     albuterol (PROVENTIL) (2.5 MG/3ML) 0.083% nebulizer solution Take 3 mLs (2.5 mg total) by nebulization every 6 (six) hours as needed for wheezing or shortness of breath. 75  mL 12   allopurinol (ZYLOPRIM) 100 MG tablet Take 100 mg by mouth 2 (two) times daily.     amLODipine (NORVASC) 10 MG tablet Take 10 mg by mouth daily.     atorvastatin (LIPITOR) 10 MG tablet Take 10 mg by mouth at bedtime.     Budeson-Glycopyrrol-Formoterol (BREZTRI AEROSPHERE) 160-9-4.8 MCG/ACT AERO Inhale 2 puffs into the lungs in the morning and at bedtime. 10.7 g 12   chlorthalidone (HYGROTON) 50 MG tablet Take 50 mg by mouth daily.     Cholecalciferol 50 MCG (2000 UT) TABS Take 2,000 Units by mouth daily.     FLUoxetine (PROZAC) 20 MG capsule Take 20 mg by mouth daily.     guaiFENesin (MUCINEX) 600 MG 12 hr tablet Take 1 tablet (600 mg total) by mouth 2 (two) times daily. 60 tablet 1   levETIRAcetam (KEPPRA) 500 MG tablet Take 500 mg by mouth 2 (two) times daily.     omeprazole (PRILOSEC) 40 MG capsule Take 40 mg by mouth daily.     ondansetron (ZOFRAN-ODT) 4 MG disintegrating tablet Take 1 tablet (4 mg total) by mouth every 8 (eight) hours as needed for nausea. 10 tablet 0   predniSONE  (DELTASONE) 10 MG tablet 4 tabs for 2 days, then 3 tabs for 2 days, 2 tabs for 2 days, then 1 tab for 2 days, then stop 20 tablet 0   colchicine 0.6 MG tablet Take 1 tablet (0.6 mg total) by mouth daily for 5 days. 5 tablet 0   Budeson-Glycopyrrol-Formoterol (BREZTRI AEROSPHERE) 160-9-4.8 MCG/ACT AERO Inhale 2 puffs into the lungs in the morning and at bedtime. 10.7 g 0   No facility-administered medications prior to visit.     Review of Systems:   Constitutional: No weight loss or gain, night sweats, fevers, chills, fatigue, lassitude  HEENT: No headaches, difficulty swallowing, tooth/dental problems, or sore throat. No sneezing, itching, ear ache, nasal congestion, or post nasal drip CV:  No chest pain, orthopnea, PND, swelling in lower extremities, anasarca, dizziness, palpitations, syncope Resp: +shortness of breath with exertion (improved; baseline); minimal cough. No wheeze. No hemoptysis.  No chest wall deformity GI:  No heartburn, indigestion, abdominal pain, nausea, vomiting, diarrhea, change in bowel habits, loss of appetite, bloody stools.  Skin: No rash, lesions, ulcerations MSK:  No joint pain or swelling.  No decreased range of motion.  No back pain. Neuro: No dizziness or lightheadedness.  Psych: No depression or anxiety. Mood stable.     Physical Exam:  BP 128/70   Pulse 77   Ht 5\' 9"  (1.753 m)   Wt 179 lb 9.6 oz (81.5 kg)   SpO2 99% Comment: on RA  BMI 26.52 kg/m   GEN: Pleasant, interactive, chronically ill appearing; in no acute distress. HEENT:  Normocephalic and atraumatic. PERRLA. Sclera white. Nasal turbinates pink, moist and patent bilaterally. No rhinorrhea present. Oropharynx pink and moist, without exudate or edema. No lesions, ulcerations, or postnasal drip.  NECK:  Supple w/ fair ROM. No JVD present. Normal carotid impulses w/o bruits. Thyroid symmetrical with no goiter or nodules palpated. No lymphadenopathy.   CV: RRR, no m/r/g, no peripheral edema.  Pulses intact, +2 bilaterally. No cyanosis, pallor or clubbing. PULMONARY:  Unlabored, regular breathing. Clear bilaterally A&P w/o wheezes/rales/rhonchi. No accessory muscle use. No dullness to percussion. GI: BS present and normoactive. Soft, non-tender to palpation. No organomegaly or masses detected. No CVA tenderness. MSK: No erythema, warmth or tenderness. Cap refil <2 sec all extrem. No deformities  or joint swelling noted.  Neuro: A/Ox3. No focal deficits noted.   Skin: Warm, no lesions or rashe Psych: Normal affect and behavior. Judgement and thought content appropriate.     Lab Results:  CBC    Component Value Date/Time   WBC 6.9 03/10/2022 0344   RBC 3.96 (L) 03/10/2022 0344   HGB 12.4 (L) 03/10/2022 0344   HCT 36.2 (L) 03/10/2022 0344   PLT 253 03/10/2022 0344   MCV 91.4 03/10/2022 0344   MCH 31.3 03/10/2022 0344   MCHC 34.3 03/10/2022 0344   RDW 12.0 03/10/2022 0344   LYMPHSABS 2.4 12/23/2021 1028   MONOABS 0.5 12/23/2021 1028   EOSABS 0.1 12/23/2021 1028   BASOSABS 0.0 12/23/2021 1028    BMET    Component Value Date/Time   NA 137 03/10/2022 0344   K 3.6 03/10/2022 0344   CL 104 03/10/2022 0344   CO2 24 03/10/2022 0344   GLUCOSE 124 (H) 03/10/2022 0344   BUN 19 03/10/2022 0344   CREATININE 1.14 03/10/2022 0344   CALCIUM 9.1 03/10/2022 0344   GFRNONAA >60 03/10/2022 0344   GFRAA >60 09/16/2018 1255    BNP    Component Value Date/Time   BNP 24.4 12/27/2020 0825     Imaging:  No results found.       Latest Ref Rng & Units 07/13/2021    2:40 PM  PFT Results  FVC-Pre L 2.09   FVC-Predicted Pre % 56   FVC-Post L 2.62   FVC-Predicted Post % 70   Pre FEV1/FVC % % 43   Post FEV1/FCV % % 50   FEV1-Pre L 0.89   FEV1-Predicted Pre % 31   FEV1-Post L 1.31   DLCO uncorrected ml/min/mmHg 13.99   DLCO UNC% % 55   DLCO corrected ml/min/mmHg 14.75   DLCO COR %Predicted % 58   DLVA Predicted % 79   TLC L 7.44   TLC % Predicted % 109   RV %  Predicted % 224     No results found for: "NITRICOXIDE"      Assessment & Plan:   COPD with asthma (HCC) Very severe COPD with asthmatic component.  Recently with AECOPD which appears to have resolved.  He seems to have recovered well.  Lung exam is significantly improved with clear breath sounds.  He has a high symptom burden at baseline.  Currently on triple therapy with Breztri.  He uses albuterol nebs frequently.  We again reviewed overuse and the negative impact that this can have.  Strongly advised him to only use it for rescue purposes for shortness of breath or wheezing.  Patient Instructions  Continue Breztri 2 puffs Twice daily. Brush tongue and rinse mouth afterwards  Continue Albuterol inhaler 2 puffs or 3 mL neb every 6 hours as needed for shortness of breath or wheezing. Notify if symptoms persist despite rescue inhaler/neb use. Do not use your albuterol more than prescribed! Continue omeprazole 40 mg daily  Continue Mucinex 600 mg Twice daily for chest congestion Continue Flutter valve 2-3 times a day   We will repeat chest x ray in 4 weeks    Follow up in 4 weeks with Dr. Valeta Harms or Alanson Aly. If symptoms do not improve or worsen, please contact office for sooner follow up or seek emergency care.     Multifocal pneumonia Previous imaging with evidence of multifocal pneumonia.  He completed Augmentin course in July.  He is clinically improved.  Plan for repeat CXR in 4 weeks  unless symptoms fail to improve.   Malignant neoplasm of bronchus of left upper lobe (HCC) Status post SBRT. CT chest from April 2023 stable with substantial reduction in LUL nodule. Follow up with radiation oncology as scheduled.      I spent 28 minutes of dedicated to the care of this patient on the date of this encounter to include pre-visit review of records, face-to-face time with the patient discussing conditions above, post visit ordering of testing, clinical documentation with the  electronic health record, making appropriate referrals as documented, and communicating necessary findings to members of the patients care team.  Clayton Bibles, NP 06/16/2022  Pt aware and understands NP's role.

## 2022-06-15 NOTE — Patient Instructions (Signed)
Continue Breztri 2 puffs Twice daily. Brush tongue and rinse mouth afterwards  Continue Albuterol inhaler 2 puffs or 3 mL neb every 6 hours as needed for shortness of breath or wheezing. Notify if symptoms persist despite rescue inhaler/neb use. Do not use your albuterol more than prescribed! Continue omeprazole 40 mg daily  Continue Mucinex 600 mg Twice daily for chest congestion Continue Flutter valve 2-3 times a day   We will repeat chest x ray in 4 weeks    Follow up in 4 weeks with Dr. Valeta Harms or Alanson Aly. If symptoms do not improve or worsen, please contact office for sooner follow up or seek emergency care.

## 2022-06-16 ENCOUNTER — Encounter: Payer: Self-pay | Admitting: Nurse Practitioner

## 2022-06-16 NOTE — Assessment & Plan Note (Signed)
Status post SBRT. CT chest from April 2023 stable with substantial reduction in LUL nodule. Follow up with radiation oncology as scheduled.

## 2022-06-16 NOTE — Assessment & Plan Note (Signed)
Previous imaging with evidence of multifocal pneumonia.  He completed Augmentin course in July.  He is clinically improved.  Plan for repeat CXR in 4 weeks unless symptoms fail to improve.

## 2022-06-16 NOTE — Assessment & Plan Note (Addendum)
Very severe COPD with asthmatic component.  Recently with AECOPD which appears to have resolved.  He seems to have recovered well.  Lung exam is significantly improved with clear breath sounds.  He has a high symptom burden at baseline.  Currently on triple therapy with Breztri.  He uses albuterol nebs frequently.  We again reviewed overuse and the negative impact that this can have.  Strongly advised him to only use it for rescue purposes for shortness of breath or wheezing.  Patient Instructions  Continue Breztri 2 puffs Twice daily. Brush tongue and rinse mouth afterwards  Continue Albuterol inhaler 2 puffs or 3 mL neb every 6 hours as needed for shortness of breath or wheezing. Notify if symptoms persist despite rescue inhaler/neb use. Do not use your albuterol more than prescribed! Continue omeprazole 40 mg daily  Continue Mucinex 600 mg Twice daily for chest congestion Continue Flutter valve 2-3 times a day   We will repeat chest x ray in 4 weeks    Follow up in 4 weeks with Dr. Valeta Harms or Alanson Aly. If symptoms do not improve or worsen, please contact office for sooner follow up or seek emergency care.

## 2022-06-29 DIAGNOSIS — G40909 Epilepsy, unspecified, not intractable, without status epilepticus: Secondary | ICD-10-CM | POA: Diagnosis not present

## 2022-06-29 DIAGNOSIS — N400 Enlarged prostate without lower urinary tract symptoms: Secondary | ICD-10-CM | POA: Diagnosis not present

## 2022-06-29 DIAGNOSIS — B182 Chronic viral hepatitis C: Secondary | ICD-10-CM | POA: Diagnosis not present

## 2022-06-29 DIAGNOSIS — R7309 Other abnormal glucose: Secondary | ICD-10-CM | POA: Diagnosis not present

## 2022-06-29 DIAGNOSIS — F331 Major depressive disorder, recurrent, moderate: Secondary | ICD-10-CM | POA: Diagnosis not present

## 2022-06-29 DIAGNOSIS — J449 Chronic obstructive pulmonary disease, unspecified: Secondary | ICD-10-CM | POA: Diagnosis not present

## 2022-06-29 DIAGNOSIS — I700 Atherosclerosis of aorta: Secondary | ICD-10-CM | POA: Diagnosis not present

## 2022-06-29 DIAGNOSIS — I10 Essential (primary) hypertension: Secondary | ICD-10-CM | POA: Diagnosis not present

## 2022-06-29 DIAGNOSIS — G459 Transient cerebral ischemic attack, unspecified: Secondary | ICD-10-CM | POA: Diagnosis not present

## 2022-07-14 NOTE — Progress Notes (Signed)
Formatting of this note might be different from the original.  Plastic Surgery Note    S: Ruben [name] is 2 weeks s/p R SF Dupuytren's release. The patient is overall doing well. Reports no issues following surgery. Pain is well controlled. Endorses improved ROM compared to pre-op. No complaints at this time.     The complaints are minimal. Otherwise, no issues.    O: There were no vitals filed for this visit.      PE: Lorrene Reid style incision on palmar aspect of R SF in the hypothenar eminence. Sutures in place, removed today. Some wound breakdown near the 4th webspace, otherwise healing well. No residual contracture at the [facility] or PIP joints. Radial deviation of the SF distal phalanx secondary to previous contracture.       AP: Judea Riches is a 71 y.o. male with history of Dupuytren's contracture s/p R SF fasciectomy on 06/29/22. Progressing well.   - Continue bracing.   - Follow with OT for ROM exercises.   - Sutures removed today in clinic.   - RTC as needed. Patient advised to contact us sooner with any questions or concerns.     Kandace Blitz, Medical Scribe   [facility]   Scribed July 14, 2022 1:47 PM for M. Hyacinth Meeker, MD    Electronically signed by Margaretha Sheffield, MD at 07/17/2022  6:38 PM EDT

## 2022-07-17 ENCOUNTER — Encounter: Payer: Self-pay | Admitting: Pulmonary Disease

## 2022-07-17 ENCOUNTER — Ambulatory Visit (INDEPENDENT_AMBULATORY_CARE_PROVIDER_SITE_OTHER): Payer: Medicare Other

## 2022-07-17 ENCOUNTER — Ambulatory Visit (INDEPENDENT_AMBULATORY_CARE_PROVIDER_SITE_OTHER): Payer: Medicare Other | Admitting: Pulmonary Disease

## 2022-07-17 VITALS — BP 110/50 | HR 84 | Ht 69.0 in | Wt 175.6 lb

## 2022-07-17 DIAGNOSIS — Z87891 Personal history of nicotine dependence: Secondary | ICD-10-CM

## 2022-07-17 DIAGNOSIS — J189 Pneumonia, unspecified organism: Secondary | ICD-10-CM | POA: Diagnosis not present

## 2022-07-17 DIAGNOSIS — J441 Chronic obstructive pulmonary disease with (acute) exacerbation: Secondary | ICD-10-CM | POA: Diagnosis not present

## 2022-07-17 DIAGNOSIS — C3492 Malignant neoplasm of unspecified part of left bronchus or lung: Secondary | ICD-10-CM

## 2022-07-17 MED ORDER — BREZTRI AEROSPHERE 160-9-4.8 MCG/ACT IN AERO: 2.0000 | INHALATION_SPRAY | Freq: Two times a day (BID) | RESPIRATORY_TRACT | 12 refills | Status: DC

## 2022-07-17 NOTE — Progress Notes (Signed)
Synopsis: Referred in September 2022 for lung nodule by Bruce Low, MD  Subjective:   PATIENT ID: Bruce Sullivan GENDER: male DOB: [birth year], MRN: [id number]  Chief Complaint  Patient presents with   Follow-up    This is a 71 year old gentleman, history of COPD, chronic kidney disease, hypertension, hyperlipidemia, TIA, stroke.  Presents for evaluation of lung nodule. Patient had a nuclear medicine PET scan in March 2022 which revealed a 11 x 7 x 8 mm left upper lobe pleural-based nodule that had an SUV of 2.  Recommended at the time close CT surveillance follow-up.  OV 08/15/2021: Here today for follow-up after surgical consultation.  Dr. Kipp Sullivan felt that patient would be better served by having robotic assisted bronchoscopy fiducial placement and radiation therapy versus undergoing surgical lobectomy.  After discussing the risk-benefit alternative with the patient and patient's wife.  They are here today to discuss bronchoscopy and neck steps.  We discussed this today in detail risk benefits and alternatives and they were agreeable to proceed.  Unfortunately over the past few weeks patient has had increased use of his albuterol as well as shortness of breath and wheezing.  On exam today is also wheezing.  OV 11/30/2021:Patient completed treatments with SBRT to lesion within the lung.  After bronchoscopy with tissue diagnosis and fiducial placement.  Patient had consultation initially with Dr. Lisbeth Sullivan and radiation oncology on 10/20/2021 followed by subsequent SBRT treatments.  Patient with increasing shortness of breath over the past week.  Nocturnal wheezing.  Difficulty breathing at nighttime.  Using his albuterol every couple of hours.  Also using his albuterol nebulizer.  Still using his Wixela plus Spiriva from the [place].  01/20/2022: here today for follow up regarding copd. Unable to get MRI. CT head with brain mass, concerning for meningioma. Saw Bruce Sullivan today. Plans for removal of a  cardiac device so he can get the brain mri at somepoint. [facility]  From respiratory standpoint he is able to do most of his activities of daily living.  He does walk slower walks with a cane.  He does have some shortness of breath.  His wife is concerned but does feel like he has been stable.  He has done well with the transition to [place].  And would like to stay on it if possible.  OV 07/17/2022: feeling better after getting treated for pneumonia.  Patient was seen this past summer chest x-ray in July with multifocal airspace disease treated for pneumonia of COPD exacerbation.  He has been doing well since.  He feels better.  Using his [place] daily.  Using his albuterol daily.  His wife feels like sometimes he just takes his albuterol to be taking it.  It may be that he does not really need but he feels better when he does.     Past Medical History:  Diagnosis Date   Anxiety    Arthritis    Asthma    Atherosclerosis 01/16/2015   Noted on CXR   Bilateral lower extremity edema 01/16/2015   Chronic kidney disease (CKD), stage II (mild)    COPD (chronic obstructive pulmonary disease) (HCC)    Dementia (HCC)    Depression    Dyspnea    GERD (gastroesophageal reflux disease)    Gout    Hepatitis 12/2019   Hx Hep C positive in blood. Treated.   History of colon polyps    Hoarseness    Hyperlipidemia    Hypertension  Lung cancer ([place]) 09/07/2021   Neck abscess 09/03/2016   Pre-diabetes    Pulmonary emphysema ([place])    Seizure ([place])    Stroke [facility]) 2014   Thyroid disease    TIA (transient ischemic attack)    Urinary frequency    Urine retention    UTI (urinary tract infection)      Family History  Problem Relation Age of Onset   Liver cancer Neg Hx    Liver disease Neg Hx      Past Surgical History:  Procedure Laterality Date   BRONCHIAL BIOPSY  09/07/2021   Procedure: BRONCHIAL BIOPSIES;  Surgeon: Bruce Nash, DO;   Location: [place] [facility];  Service: Pulmonary;;   BRONCHIAL BRUSHINGS  09/07/2021   Procedure: BRONCHIAL BRUSHINGS;  Surgeon: Bruce Nash, DO;  Location: [place] [facility];  Service: Pulmonary;;   BRONCHIAL NEEDLE ASPIRATION BIOPSY  09/07/2021   Procedure: BRONCHIAL NEEDLE ASPIRATION BIOPSIES;  Surgeon: Bruce Nash, DO;  Location: [place] [facility];  Service: Pulmonary;;   COLONOSCOPY  08/29/2018   CYSTOSCOPY WITH BIOPSY N/A 11/20/2018   Procedure: CYSTOSCOPY WITH BLADDER BIOPSY;  Surgeon: Bruce Mons, MD;  Location: [facility];  Service: Urology;  Laterality: N/A;   FIDUCIAL MARKER PLACEMENT  09/07/2021   Procedure: FIDUCIAL MARKER PLACEMENT;  Surgeon: Bruce Nash, DO;  Location: [place] [facility];  Service: Pulmonary;;   HERNIA REPAIR     t monitor     TRANSURETHRAL RESECTION OF PROSTATE N/A 11/20/2018   Procedure: TRANSURETHRAL RESECTION OF THE PROSTATE (TURP);  Surgeon: Bruce Mons, MD;  Location: [facility];  Service: Urology;  Laterality: N/A;   VIDEO BRONCHOSCOPY WITH ENDOBRONCHIAL NAVIGATION Bilateral 09/07/2021   Procedure: VIDEO BRONCHOSCOPY WITH ENDOBRONCHIAL NAVIGATION;  Surgeon: Bruce Nash, DO;  Location: [place];  Service: Pulmonary;  Laterality: Bilateral;  ION, with fiducial placement    Social History   Socioeconomic History   Marital status: Married    Spouse name: Not on file   Number of children: Not on file   Years of education: Not on file   Highest education level: Not on file  Occupational History   Not on file  Tobacco Use   Smoking status: Former    Types: E-cigarettes    Quit date: 01/2014    Years since quitting: 8.5   Smokeless tobacco: Never  Vaping Use   Vaping Use: Never used  Substance and Sexual Activity   Alcohol use: No   Drug use: No   Sexual activity: Not on file  Other Topics Concern   Not on file  Social History Narrative   Not on file   Social Determinants of Health    Financial Resource Strain: Not on file  Food Insecurity: Not on file  Transportation Needs: Not on file  Physical Activity: Not on file  Stress: Not on file  Social Connections: Not on file  Intimate Partner Violence: Not on file     Allergies  Allergen Reactions   Bee Venom Anaphylaxis    Face gets red and hard to breathe      Outpatient Medications Prior to Visit  Medication Sig Dispense Refill   albuterol (PROVENTIL) (2.5 MG/3ML) 0.083% nebulizer solution Take 3 mLs (2.5 mg total) by nebulization every 6 (six) hours as needed for wheezing or shortness of breath. 75 mL 12   Budeson-Glycopyrrol-Formoterol (BREZTRI AEROSPHERE) 160-9-4.8 MCG/ACT AERO Inhale 2 puffs into the lungs in the morning and at bedtime. 10.7 g 12  acetaminophen (TYLENOL) 500 MG tablet Take 500 mg by mouth 2 (two) times daily as needed for pain.     allopurinol (ZYLOPRIM) 100 MG tablet Take 100 mg by mouth 2 (two) times daily.     amLODipine (NORVASC) 10 MG tablet Take 10 mg by mouth daily.     atorvastatin (LIPITOR) 10 MG tablet Take 10 mg by mouth at bedtime.     chlorthalidone (HYGROTON) 50 MG tablet Take 50 mg by mouth daily.     Cholecalciferol 50 MCG (2000 UT) TABS Take 2,000 Units by mouth daily.     colchicine 0.6 MG tablet Take 1 tablet (0.6 mg total) by mouth daily for 5 days. 5 tablet 0   FLUoxetine (PROZAC) 20 MG capsule Take 20 mg by mouth daily.     guaiFENesin (MUCINEX) 600 MG 12 hr tablet Take 1 tablet (600 mg total) by mouth 2 (two) times daily. (Patient not taking: Reported on 07/17/2022) 60 tablet 1   levETIRAcetam (KEPPRA) 500 MG tablet Take 500 mg by mouth 2 (two) times daily.     omeprazole (PRILOSEC) 40 MG capsule Take 40 mg by mouth daily.     ondansetron (ZOFRAN-ODT) 4 MG disintegrating tablet Take 1 tablet (4 mg total) by mouth every 8 (eight) hours as needed for nausea. 10 tablet 0   No facility-administered medications prior to visit.    Review of Systems  Constitutional:   Negative for chills, fever, malaise/fatigue and weight loss.  HENT:  Negative for hearing loss, sore throat and tinnitus.   Eyes:  Negative for blurred vision and double vision.  Respiratory:  Positive for shortness of breath. Negative for cough, hemoptysis, sputum production, wheezing and stridor.   Cardiovascular:  Negative for chest pain, palpitations, orthopnea, leg swelling and PND.  Gastrointestinal:  Negative for abdominal pain, constipation, diarrhea, heartburn, nausea and vomiting.  Genitourinary:  Negative for dysuria, hematuria and urgency.  Musculoskeletal:  Negative for joint pain and myalgias.  Skin:  Negative for itching and rash.  Neurological:  Negative for dizziness, tingling, weakness and headaches.  Endo/Heme/Allergies:  Negative for environmental allergies. Does not bruise/bleed easily.  Psychiatric/Behavioral:  Negative for depression. The patient is not nervous/anxious and does not have insomnia.   All other systems reviewed and are negative.    Objective:  Physical Exam Vitals reviewed.  Constitutional:      General: He is not in acute distress.    Appearance: He is well-developed.  HENT:     Head: Normocephalic and atraumatic.     Mouth/Throat:     Pharynx: No oropharyngeal exudate.  Eyes:     Conjunctiva/sclera: Conjunctivae normal.     Pupils: Pupils are equal, round, and reactive to light.  Neck:     Vascular: No JVD.     Trachea: No tracheal deviation.     Comments: Loss of supraclavicular fat Cardiovascular:     Rate and Rhythm: Normal rate and regular rhythm.     Heart sounds: S1 normal and S2 normal.     Comments: Distant heart tones Pulmonary:     Effort: No tachypnea or accessory muscle usage.     Breath sounds: No stridor. Decreased breath sounds (throughout all lung fields) present. No wheezing, rhonchi or rales.     Comments: Diminshed breath sounds bilaterally  Abdominal:     General: Bowel sounds are normal. There is no distension.      Palpations: Abdomen is soft.     Tenderness: There is no abdominal tenderness.  Musculoskeletal:        General: Deformity (muscle wasting ) present.  Skin:    General: Skin is warm and dry.     Capillary Refill: Capillary refill takes less than 2 seconds.     Findings: No rash.  Neurological:     Mental Status: He is alert and oriented to person, place, and time.  Psychiatric:        Behavior: Behavior normal.      Vitals:   07/17/22 1600  BP: (!) 110/50  Pulse: 84  SpO2: 96%  Weight: 175 lb 9.6 oz (79.7 kg)  Height: 5\' 9"  (1.753 m)   96% on RA BMI Readings from Last 3 Encounters:  07/17/22 25.93 kg/m  06/15/22 26.52 kg/m  06/01/22 25.49 kg/m   Wt Readings from Last 3 Encounters:  07/17/22 175 lb 9.6 oz (79.7 kg)  06/15/22 179 lb 9.6 oz (81.5 kg)  06/01/22 172 lb 9.6 oz (78.3 kg)     CBC    Component Value Date/Time   WBC 6.9 03/10/2022 0344   RBC 3.96 (L) 03/10/2022 0344   HGB 12.4 (L) 03/10/2022 0344   HCT 36.2 (L) 03/10/2022 0344   PLT 253 03/10/2022 0344   MCV 91.4 03/10/2022 0344   MCH 31.3 03/10/2022 0344   MCHC 34.3 03/10/2022 0344   RDW 12.0 03/10/2022 0344   LYMPHSABS 2.4 12/23/2021 1028   MONOABS 0.5 12/23/2021 1028   EOSABS 0.1 12/23/2021 1028   BASOSABS 0.0 12/23/2021 1028    Chest Imaging: June 2022 CT imaging: CT imaging was completed at [facility].  Patient brought disc today to the office. We reviewed imaging today in the office which shows a lobulated left upper lobe medial pleural-based lesion concerning for primary bronchogenic carcinoma. The patient's images have been independently reviewed by me.    CXR July 2023: Bilateraly airspace disease concerning for pneumonia  The patient's images have been independently reviewed by me.     Pulmonary Functions Testing Results:    Latest Ref Rng & Units 07/13/2021    2:40 PM  PFT Results  FVC-Pre L 2.09   FVC-Predicted Pre % 56   FVC-Post L 2.62   FVC-Predicted Post % 70    Pre FEV1/FVC % % 43   Post FEV1/FCV % % 50   FEV1-Pre L 0.89   FEV1-Predicted Pre % 31   FEV1-Post L 1.31   DLCO uncorrected ml/min/mmHg 13.99   DLCO UNC% % 55   DLCO corrected ml/min/mmHg 14.75   DLCO COR %Predicted % 58   DLVA Predicted % 79   TLC L 7.44   TLC % Predicted % 109   RV % Predicted % 224     FeNO:   Pathology:   FINAL MICROSCOPIC DIAGNOSIS:   A. LUNG, LUL, FINE NEEDLE ASPIRATION:  - Malignant cells consistent with adenocarcinoma, see comment   Echocardiogram:   Heart Catheterization:     Assessment & Plan:     ICD-10-CM   1. Multifocal pneumonia  J18.9 DG Chest 2 View    2. Chronic obstructive pulmonary disease with acute exacerbation (HCC)  J44.1 Budeson-Glycopyrrol-Formoterol (BREZTRI AEROSPHERE) 160-9-4.8 MCG/ACT AERO    3. Stage I adenocarcinoma of lung, left (HCC)  C34.92     4. Former smoker  Z87.891       Discussion:  This is a 71 year old gentleman, former smoker quit in 2015, baseline COPD.  Diagnosed with a stage I adenocarcinoma of the left upper lobe status post SBRT.  Was  treated for pneumonia this past summer.  Plan: Repeat two-view chest x-ray today to ensure resolution of infiltrates on chest x-ray which is approximately 8 weeks since his last. Continue Breztri 2 puffs twice daily Continue albuterol as needed Continue sending refills to the [place] pharmacy.  Patient to follow-up with [place] for COPD management in 6 months.    Current Outpatient Medications:    albuterol (PROVENTIL) (2.5 MG/3ML) 0.083% nebulizer solution, Take 3 mLs (2.5 mg total) by nebulization every 6 (six) hours as needed for wheezing or shortness of breath., Disp: 75 mL, Rfl: 12   acetaminophen (TYLENOL) 500 MG tablet, Take 500 mg by mouth 2 (two) times daily as needed for pain., Disp: , Rfl:    allopurinol (ZYLOPRIM) 100 MG tablet, Take 100 mg by mouth 2 (two) times daily., Disp: , Rfl:    amLODipine (NORVASC) 10 MG tablet, Take 10 mg by mouth daily., Disp: ,  Rfl:    atorvastatin (LIPITOR) 10 MG tablet, Take 10 mg by mouth at bedtime., Disp: , Rfl:    Budeson-Glycopyrrol-Formoterol (BREZTRI AEROSPHERE) 160-9-4.8 MCG/ACT AERO, Inhale 2 puffs into the lungs in the morning and at bedtime., Disp: 10.7 g, Rfl: 12   chlorthalidone (HYGROTON) 50 MG tablet, Take 50 mg by mouth daily., Disp: , Rfl:    Cholecalciferol 50 MCG (2000 UT) TABS, Take 2,000 Units by mouth daily., Disp: , Rfl:    colchicine 0.6 MG tablet, Take 1 tablet (0.6 mg total) by mouth daily for 5 days., Disp: 5 tablet, Rfl: 0   FLUoxetine (PROZAC) 20 MG capsule, Take 20 mg by mouth daily., Disp: , Rfl:    guaiFENesin (MUCINEX) 600 MG 12 hr tablet, Take 1 tablet (600 mg total) by mouth 2 (two) times daily. (Patient not taking: Reported on 07/17/2022), Disp: 60 tablet, Rfl: 1   levETIRAcetam (KEPPRA) 500 MG tablet, Take 500 mg by mouth 2 (two) times daily., Disp: , Rfl:    omeprazole (PRILOSEC) 40 MG capsule, Take 40 mg by mouth daily., Disp: , Rfl:    ondansetron (ZOFRAN-ODT) 4 MG disintegrating tablet, Take 1 tablet (4 mg total) by mouth every 8 (eight) hours as needed for nausea., Disp: 10 tablet, Rfl: 0    Bruce Nash, DO [place] 07/17/2022 4:20 PM

## 2022-07-17 NOTE — Patient Instructions (Addendum)
Thank you for visiting Dr. Valeta Harms at [facility]. Today we recommend the following:  Orders Placed This Encounter  Procedures   DG Chest 2 View   CXR today  Continue Breztri inhaler  Continue albuterol as needed   Return in about 6 months (around 01/15/2023) for with APP or Dr. Valeta Harms.    Please do your part to reduce the spread of COVID-19.

## 2022-07-19 NOTE — Progress Notes (Signed)
Bruce Sullivan, please let patient know that I have reviewed his chest x-ray.  He has a repeat CT of the chest planned with radiation oncology next month.  If there is any abnormalities on this they will likely let us know.  Thanks,  BLI  Garner Nash, DO [place] [facility] 07/19/2022 11:45 AM

## 2022-07-20 NOTE — Progress Notes (Signed)
Formatting of this note is different from the original.  Images from the original note were not included.    [facility] Cardiac Electrophysiology Outpatient Visit     Encounter Date: 07/20/2022    Patient: Ruben [name]  MRN: [id number]  Date of Birth: June 09, 1951  Electrophysiology Physician: Ruben [name]     Chief Complaint:  Follow-up    History of Present Illness:   Ruben [name] is a 71 y.o. male with a medical history of  rheumatic aortic stenosis s/p TAVR 08/2019, chronic diastolic heart failure, s/p ICD, VT s/p ablation 06/2021.  He experienced multiple episodes of unheralded syncope in the fall 2021.  He underwent dual-chamber pacemaker implantation at [facility] in 08/2020.  He then presented with recurrent ventricular tachycardia in 10/2020 and subsequently underwent removal and replacement of his dual-chamber pacemaker with a dual-chamber ICD (Abbott). In February and March of 2022 he presented with multiple episodes of rapid VT and was placed on amiodarone in 01/2021. He followed up with Ruben [name] 03/2021 with plan for VT ablation 06/2021.  He presented to the ER two weeks prior to scheduled ablation  With CP and multiple episodes of VT and underwent VT ablation 06/15/2021 c/b pericardial effusion requiring drain placement. His amiodarone was stopped at discharge. Clinic follow up 01/2022 he was feeling well with AMS episodes on ICD that appeared to be false positive due to competitive atrial pacing - longest lasting 8 seconds.      Today, Ruben [name] returns for arrhythmia and device follow up.  Reports he is feeling overall well - does have mild fatigue but denies any physical activity limitations.  He denies palpitations, near-syncope or syncope, dizziness, or lightheadedness. Denies chest pain or dyspnea at rest or with exertion. He denies significant weight gain, orthopnea, PND or significant lower extremity edema. Denies any easy bleeding/bruising or bloody stools.      CHA2DS2-VASc: 3  Current  anticoagulant/antiplatelet medications: Aspirin 81mg  daily  Current antiarrhythmic medications: none  Current rate control medications: Metoprolol tartrate 25mg  bid    Device: Abbott DC ICD    Medical History:  He  has a past medical history of Aortic stenosis, Aortic valve stenosis, B12 deficiency, BPH (benign prostatic hyperplasia), Cancer, Depression, Heart murmur, Hyperlipidemia, Hypertension, Presence of combination internal cardiac defibrillator (ICD) and pacemaker, and Thyroid disease.    Surgical History:  He  has a past surgical history that includes Neck surgery (03/20/2017); Colonoscopy (2009, 2019); upper endoscopy (egd) (2019); Glossectomy (02/13/2017); Dupuytren contracture release (Right); Wisdom tooth extraction; replace aortic valve perq femoral artry approach (Bilateral, 08/27/2019); pr colonoscopy flx dx w/collj spec when pfrmd (N/A, 12/22/2021); and pr part palmar fasciec,open 1 digit (Right, 06/29/2022).    Social History: He  reports that he has never smoked. He has never been exposed to tobacco smoke. He has never used smokeless tobacco. He reports that he does not currently use alcohol. He reports that he does not use drugs.    Family History: He family history includes COPD in his mother; Heart failure in his father; Valvular heart disease in his mother.    Allergies:He has No Known Allergies.     Current Outpatient Medications:    Current Outpatient Medications   Medication Sig    aspirin 81 mg chewable tablet Chew 1 tablet daily.    fludrocortisone (Florinef) 0.1 mg tablet TAKE 1 TABLET BY MOUTH ONE TIME DAILY    levothyroxine (Synthroid) 175 mcg tablet Take 1 tablet by mouth every morning.    metoprolol tartrate (  Lopressor) 25 mg tablet TAKE 1 TABLET BY MOUTH TWICE A DAY    midodrine (Proamatine) 5 mg tablet TAKE 1 TABLET BY MOUTH 3 TIMES DAILY. DO NOT TAKE WITHIN 4 HOURS OF BEDTIME. (Patient taking differently: Take 1 tablet by mouth 2 times daily. Do not take within 4 hours of bedtime.)     silodosin (RAPAFLO) 8 mg capsule Take 1 capsule by mouth at bedtime.    gabapentin (Neurontin) 300 mg capsule Take 1 capsule by mouth 3 times daily.    oxyCODONE IR (Roxicodone) 5 mg immediate release tablet Take 1 tablet by mouth every 4 hours as needed for severe pain.    venlafaxine (Effexor XR) 75 mg extended release 24 hr capsule Take 1 capsule by mouth at bedtime.     Review of Systems:  An extensive ROS was reviewed and all others negative, except as noted above.     Physical Exam:  BP 129/75   Pulse 85   Temp 36.6 C (97.9 F)   Ht 182.9 cm (6')   Wt (!) 101.2 kg (223 lb)   SpO2 96%   BMI 30.24 kg/m   GEN:  NAD, Cooperative  HEENT: WNL, Normocephalic   NECK:   Supple, No JVD at 90 degrees  CV: Regular rate and rhythm, No audible murmurs, rubs or gallops   PULM: Clear to auscultation bilaterally with fair respiratory effort  EXT: Warm and well perfused, No cyanosis or lower extremity edema    Neuro: AAO x 3  Psych: Normal affect and mood     Labs/Imaging:  Lab Results   Component Value Date    WBC 4.20 05/11/2022    HGB 12.3 05/11/2022    HCT 37.1 05/11/2022    MCV 90.5 05/11/2022    PLT 218 05/11/2022     Lab Results   Component Value Date    CREATININE 1.1 05/31/2022    BUN 21 05/31/2022    NA 140.0 05/31/2022    K 4.2 05/31/2022    CL 106 05/31/2022    CO2CT 25 05/31/2022     Lab Results   Component Value Date    MG 2.0 06/18/2021     Lab Results   Component Value Date    INR 1.14 08/29/2019    INR 1.03 08/27/2019    INR 0.98 07/17/2019     Device Interrogation 07/20/2022:  Underlying rhythm: SR with AVB at 70bpm   Ventricular pacing >99% - which is up from visit 01/12/2022 - 13%    Permanent Programming Changes made today:  LRL decreased from 85 bpm to 60 bpm.    Assessment & Plan:  Ruben [name] is a 71 y.o. male with a medical history of rheumatic aortic stenosis s/p TAVR 08/2019, chronic diastolic heart failure, s/p ICD, VT s/p ablation 06/2021.  He presents today for arrhythmia and device follow  up.  Feeling overall well.  However, device interrogation revealed RV pacing >99%.  Last clinic visit 01/2022 RV pacing only 13%.  Underlying rhythm today SR with AVB at 70 bpm.  LRL decreased from 85 bpm to 60 bpm to decrease ovall RV pacing.  Will continue remote ICD monitoring quarterly and see him back in 4-6 weeks to follow up on setting adjustments.  Continue all medications as prescribed.     Reviewed medications with patient today.  Medication list up to date.  Time given to ask questions.  Answered all questions.  Instructed today to call or come back if there are any  changes in health status, worsening or new symptoms.  If emergent go directly to the emergency room.     Follow up:   Return in about 6 weeks (around 08/31/2022), or if symptoms worsen or fail to improve, for In Person Visit.    Arneta Cliche, DNP, FNP-BC  Cardiac Electrophysiology Nurse Practitioner  [facility]    Total time independently spent on today's visit was 42 minutes. This time included: face-to-face time evaluating the patient as well as additional non-face-to-face time spent on: Preparing to see the patient by obtaining and reviewing previous test results, records, and medical history, Performing a medically appropriate history and exam and documenting relevant clinical information for this visit, Counseling and educating patient, and Communicating with other health care professionals.  Electronically signed by Jilda Panda, DNP at 07/24/2022 11:03 AM EDT

## 2022-07-31 NOTE — Progress Notes (Signed)
Formatting of this note is different from the original.  Subjective:     Patient Active Problem List    Diagnosis Date Noted    Iron deficiency 05/18/2022    Vitamin B12 deficiency 06/27/2021    Acquired hypothyroidism 06/27/2021    Anxiety 06/27/2021    Benign prostatic hyperplasia with urinary hesitancy 06/27/2021    Head and neck cancer 06/27/2021    Pernicious anemia 06/27/2021    S/P TAVR (transcatheter aortic valve replacement)     Ventricular tachyarrhythmia     Aortic valve regurgitation     Orthostatic hypotension     ICD (implantable cardioverter-defibrillator) discharge     Dupuytren's contracture 06/13/2021    Implantable cardioverter-defibrillator (ICD) in situ 06/13/2021    Rising PSA level 06/13/2021    Paravalvular leak (prosthetic valve) 05/09/2021    History of transcatheter aortic valve replacement (TAVR)     Non-rheumatic aortic stenosis 07/04/2019    Chronic combined systolic and diastolic heart failure 07/04/2019    Essential hypertension 07/04/2019     Ruben [name] is a 71 y.o. male who presents for   Subsequent Annual Medicare Wellness Exam (AWV)    The following portions of the patient's history were reviewed and updated: allergies, current medications and supplements, past family history, past medical history, hospitalizations, past surgical history and problem list. Social history, alcohol, tobacco, and drug use reviewed and updated.    BMI Counseling: If BMI(Body mass index is 30.3 kg/m.) is <18 or >25, counseled regarding weight management.     Depression & Fall Screening: reviewed/discussed.    Patient has not completed the Medicare Health Risk Assessment via Epic Questionnaire for this visit.      See Media Tab for Scanned AWV Health Risk Assessment.    Screened for potential Substance Use Disorder, counseled and referred if indicated.     Patient Care Team:    Patient Care Team:  Anise Salvo, MD as PCP - General (Internal Medicine)  Curt Jews as Registered Nurse (General  Surgery)  Alphonzo Lemmings, MD as Procedural Attending (Cardiology)  Objective:     BP 137/70 (BP Location: Right arm)   Pulse 87   Temp 36.6 C (97.9 F)   Ht 182.9 cm (6')   Wt (!) 101.3 kg (223 lb 6.4 oz)   SpO2 97%   BMI 30.30 kg/m   Exam: Skin exam revealed 2 moles on his anterior chest that were quite dark but both less than 6 mm size lungs clear although he has diminished breath sounds on the left in comparison to the right cardiac revealed a loud systolic murmur crescendo decrescendo type I could not appreciate a diastolic murmur his S2 is not sharp murmurs heard in the aortic area also in the clavicle into the carotid artery he has a soft abdomen and no edema today    Assessment:     Medicare Annual Wellness Visit   His healthcare maintenance issues are up-to-date except for PSA which has been over a year since this was last done.  I advised him that I would draw it but if it was continuing to elevate we would send him back to the urologist who he recently saw for some urinary retention.  It has never been outside of the normal range but the rate of its rise is suggestive that there may be something going on in the prostate gland.  His colonoscopy was in January 2023 and negative otherwise all of his other healthcare screening is  up-to-date.  He has been screened for diabetes and I will check his cholesterol today    Plan:   During the course of the visit the patient was educated and counseled about appropriate screening and preventive services as outlined in the written plan given to the patient.  Routine age & sex specific prevention was reviewed and discussed to include immunizations, screening labs, and applicable screening studies.  Healthy lifestyle including appropriate diet, physical activity, healthy weight was discussed.     Discussed advanced directives.  He was given information regarding advanced directives if one did not exist.  Encouraged to bring advanced directives to clinic  if an advanced directive already exists and a copy is not on file.      The personalized prevention plan was given to the patient.   Electronically signed by Alvis Lemmings, MD at 07/31/2022  1:28 PM EDT

## 2022-07-31 NOTE — Progress Notes (Signed)
Formatting of this note is different from the original.  Ruben [name], 71 y.o. male   PRIMARY CARE PROVIDER: Rosette Reveal, MD      HISTORY OF PRESENT ILLNESS:  Ruben [name] is a 71 y.o. male here for an annual wellness exam and he had several things to follow-up on so I did a consecutive exam  1.  Hypothyroidism he has been hypothyroid on replacement for many years his last TSH was over a year ago and in the normal range he thinks he is probably euthyroid but its not been checked he did not need a refill today but will soon  2.  Rising PSA his PSA 2 years ago was 1.7 last year was 2.7 he has been followed by urologist but has not been seen for PSA for a number of years now he just recently saw him because he had some urinary retention after going off of one of his medications at the request of the cardiologist but he had to go back on it because he could not pee without it  3.  Hyperlipidemia it has been borderline high in the past not been checked in over a year    Patient Active Problem List   Diagnosis    Non-rheumatic aortic stenosis    Chronic combined systolic and diastolic heart failure    Essential hypertension    History of transcatheter aortic valve replacement (TAVR)    Paravalvular leak (prosthetic valve)    Ventricular tachyarrhythmia    Aortic valve regurgitation    Orthostatic hypotension    ICD (implantable cardioverter-defibrillator) discharge    S/P TAVR (transcatheter aortic valve replacement)    Vitamin B12 deficiency    Acquired hypothyroidism    Anxiety    Benign prostatic hyperplasia with urinary hesitancy    Head and neck cancer    Pernicious anemia    Iron deficiency    Dupuytren's contracture    Implantable cardioverter-defibrillator (ICD) in situ    Rising PSA level     Wt Readings from Last 3 Encounters:   07/31/22 (!) 101.3 kg (223 lb 6.4 oz)   07/20/22 (!) 101.2 kg (223 lb)   06/29/22 99.1 kg (218 lb 7.6 oz)     PHYSICAL EXAM:  Vitals: BP 137/70 (BP Location:  Right arm)   Pulse 87   Temp 36.6 C (97.9 F)   Ht 182.9 cm (6')   Wt (!) 101.3 kg (223 lb 6.4 oz)   SpO2 97%   BMI 30.30 kg/m      ASSESSMENT & PLAN:    1. Encounter for annual wellness visit (AWV) in Medicare patient  See separate encounter    2. Acquired hypothyroidism  We will check a TSH and adjust his levothyroxine dose as necessary  - TSH; Future    3. Rising PSA level  Check a PSA and likely will refer back to urology  - PSA, TOTAL  (YEARLY MEDICARE SCREEN); Future    4. Mixed hyperlipidemia  Check a lipid profile and I will calculate his ASCVD score get back to him  - LIPID PANEL; Future    RTC:   Return in about 1 year (around 08/01/2023).    Patient had no questions regarding treatment plan, goals, risks or benefits and agrees to contact me or my clinic in the interim should any questions or problems arise.    I have reviewed all notes, labs, tests, and imaging done since last visit and summarized  significant findings in the note above.     French Ana Voss,MD  Electronically signed by Anise Salvo, MD at 07/31/2022  1:30 PM EDT

## 2022-08-07 ENCOUNTER — Ambulatory Visit: Payer: No Typology Code available for payment source | Admitting: Pulmonary Disease

## 2022-08-15 ENCOUNTER — Encounter (HOSPITAL_COMMUNITY): Payer: Self-pay

## 2022-08-15 ENCOUNTER — Ambulatory Visit (HOSPITAL_COMMUNITY)
Admission: RE | Admit: 2022-08-15 | Discharge: 2022-08-15 | Disposition: A | Discharge status: Home / Self Care | Payer: No Typology Code available for payment source | Source: Ambulatory Visit | Attending: Radiation Oncology | Admitting: Radiation Oncology

## 2022-08-15 DIAGNOSIS — C3412 Malignant neoplasm of upper lobe, left bronchus or lung: Secondary | ICD-10-CM | POA: Insufficient documentation

## 2022-08-15 LAB — POCT I-STAT CREATININE: Creatinine, Ser: 1.3 mg/dL — ABNORMAL HIGH (ref 0.61–1.24)

## 2022-08-15 MED ORDER — IOHEXOL 300 MG/ML  SOLN
75.0000 mL | Freq: Once | INTRAMUSCULAR | Status: AC | PRN
  Administered 2022-08-15: 75 mL via INTRAVENOUS

## 2022-08-15 MED ORDER — SODIUM CHLORIDE (PF) 0.9 % IJ SOLN
INTRAMUSCULAR | Status: AC
  Filled 2022-08-15: qty 50

## 2022-08-18 ENCOUNTER — Encounter: Payer: Self-pay | Admitting: Radiation Oncology

## 2022-08-18 NOTE — Progress Notes (Signed)
Telephone nursing appointment for Stage IA2, cT1bN0M0, NSCLC, adenocarcinoma of the LUL . I spoke w/ patient's spouse Mrs. Tip Atienza, verified her identity and began nursing interview. She reports patient experiencing his usual, extreme SOB, that's well managed w/ his albuterol medications. Spouse also reports that patient had a moderate nosebleed on 08/17/22, that was stopped by EMS once they arrived. Patient was not sent to the emergency room for this. I reviewed signs/ symptoms that may indicate the need for patient to come to the emergency room.    Meaningful use complete.   Patient aware of their 2:00pm-08/21/22 telephone appointment w/ Shona Simpson PA-C. I left my extension [phone] in case patient needs anything. Spouse verbalized understanding. This concludes the nursing interview.   Patient contact [phone]     Leandra Kern, LPN

## 2022-08-21 ENCOUNTER — Ambulatory Visit
Admission: RE | Admit: 2022-08-21 | Discharge: 2022-08-21 | Disposition: A | Discharge status: Home / Self Care | Payer: No Typology Code available for payment source | Source: Ambulatory Visit | Attending: Radiation Oncology | Admitting: Radiation Oncology

## 2022-08-21 DIAGNOSIS — C3412 Malignant neoplasm of upper lobe, left bronchus or lung: Secondary | ICD-10-CM

## 2022-08-21 NOTE — Progress Notes (Addendum)
Radiation Oncology         [phone] ________________________________  Name: Bruce Sullivan MRN: [id number]  Date: 08/21/2022  DOB: 01-21-1951  SW:NIOEVO, Denton Ar, MD  Wenda Low, MD     REFERRING PHYSICIAN: Wenda Low, MD   DIAGNOSIS: The encounter diagnosis was Malignant neoplasm of bronchus of left upper lobe ([place]).   HISTORY OF PRESENT ILLNESS:Bruce Sullivan is a 71 y.o. male with a history of Stage IA2, cT1bN0M0, NSCLC, adenocarcinoma of the LUL. He was found in September 2022 to have a 1.5 cm tumor in the LUL which had increased in size. PET scan showed an SUV of 4.9.  This remains suspicious therefore given the growth characteristics and PET scan results and the patient proceeded with bronchoscopy.  Biopsy returned positive for adenocarcinoma consistent with non-small cell lung cancer.  The patient was seen by cardiothoracic surgery and was not felt to be a good candidate.  He went on to receive ultra hypofractionated radiotherapy to the left upper lobe tumor which she completed on 11/22/2021.  He has been followed since in surveillance.   His posttreatment imaging showed stable posttreatment changes.  His most recent on 08/15/2022 also showed stable postradiation effect in the left upper lobe with an improvement in the size of the treated nodule.  Persistent findings of atherosclerotic disease and emphysematous changes were again noted.  He also has a stable lesion in the right kidney in comparison to scans from 2021.  He is contacted today to review these results.  Of note he also follows with Dr. Zada Finders for a diagnosis of meningioma. He was also treated this summer for pneumonia and chest x-rays were being followed by Dr. Valeta Harms.    PREVIOUS RADIATION THERAPY:    11/09/2021 through 11/22/2021 Ultrahypofractionated Radiotherapy Site Technique Total Dose (Gy) Dose per Fx (Gy) Completed Fx Beam Energies  Lung, Left:  LUL  IMRT 65/65 6.5 10/10 6XFFF     PAST MEDICAL HISTORY:   Past Medical History:  Diagnosis Date   Anxiety    Arthritis    Asthma    Atherosclerosis 01/16/2015   Noted on CXR   Bilateral lower extremity edema 01/16/2015   Chronic kidney disease (CKD), stage II (mild)    COPD (chronic obstructive pulmonary disease) (HCC)    Dementia (HCC)    Depression    Dyspnea    GERD (gastroesophageal reflux disease)    Gout    Hepatitis 12/2019   Hx Hep C positive in blood. Treated.   History of colon polyps    Hoarseness    Hyperlipidemia    Hypertension    Lung cancer ([place]) 09/07/2021   Neck abscess 09/03/2016   Pre-diabetes    Pulmonary emphysema ([place])    Seizure ([place])    Stroke [facility] [place]) 2014   Thyroid disease    TIA (transient ischemic attack)    Urinary frequency    Urine retention    UTI (urinary tract infection)       PAST SURGICAL HISTORY: Past Surgical History:  Procedure Laterality Date   BRONCHIAL BIOPSY  09/07/2021   Procedure: BRONCHIAL BIOPSIES;  Surgeon: Garner Nash, DO;  Location: [facility];  Service: Pulmonary;;   BRONCHIAL BRUSHINGS  09/07/2021   Procedure: BRONCHIAL BRUSHINGS;  Surgeon: Garner Nash, DO;  Location: [facility];  Service: Pulmonary;;   BRONCHIAL NEEDLE ASPIRATION BIOPSY  09/07/2021   Procedure: BRONCHIAL NEEDLE ASPIRATION BIOPSIES;  Surgeon: Garner Nash, DO;  Location: [facility];  Service: Pulmonary;;  COLONOSCOPY  08/29/2018   CYSTOSCOPY WITH BIOPSY N/A 11/20/2018   Procedure: CYSTOSCOPY WITH BLADDER BIOPSY;  Surgeon: Ceasar Mons, MD;  Location[facility];  Service: Urology;  Laterality: N/A;   FIDUCIAL MARKER PLACEMENT  09/07/2021   Procedure: FIDUCIAL MARKER PLACEMENT;  Surgeon: Garner Nash, DO;  Location: [facility];  Service: Pulmonary;;   HERNIA REPAIR     t monitor     TRANSURETHRAL RESECTION OF PROSTATE N/A 11/20/2018   Procedure: TRANSURETHRAL RESECTION OF THE PROSTATE (TURP);  Surgeon: Ceasar Mons, MD;  Location: [facility];  Service: Urology;  Laterality: N/A;   VIDEO BRONCHOSCOPY WITH ENDOBRONCHIAL NAVIGATION Bilateral 09/07/2021   Procedure: VIDEO BRONCHOSCOPY WITH ENDOBRONCHIAL NAVIGATION;  Surgeon: Garner Nash, DO;  Location: [facility];  Service: Pulmonary;  Laterality: Bilateral;  ION, with fiducial placement     FAMILY HISTORY: family history is not on file.   SOCIAL HISTORY:  reports that he quit smoking about 8 years ago. His smoking use included e-cigarettes. He has never used smokeless tobacco. He reports that he does not drink alcohol and does not use drugs. The patient is married and lives in [place].     ALLERGIES: Bee venom   MEDICATIONS:  Current Outpatient Medications  Medication Sig Dispense Refill   acetaminophen (TYLENOL) 500 MG tablet Take 500 mg by mouth 2 (two) times daily as needed for pain.     albuterol (PROVENTIL) (2.5 MG/3ML) 0.083% nebulizer solution Take 3 mLs (2.5 mg total) by nebulization every 6 (six) hours as needed for wheezing or shortness of breath. 75 mL 12   allopurinol (ZYLOPRIM) 100 MG tablet Take 100 mg by mouth 2 (two) times daily.     amLODipine (NORVASC) 10 MG tablet Take 10 mg by mouth daily.     atorvastatin (LIPITOR) 10 MG tablet Take 10 mg by mouth at bedtime.     Budeson-Glycopyrrol-Formoterol (BREZTRI AEROSPHERE) 160-9-4.8 MCG/ACT AERO Inhale 2 puffs into the lungs in the morning and at bedtime. 10.7 g 12   chlorthalidone (HYGROTON) 50 MG tablet Take 50 mg by mouth daily.     Cholecalciferol 50 MCG (2000 UT) TABS Take 2,000 Units by mouth daily.     colchicine 0.6 MG tablet Take 1 tablet (0.6 mg total) by mouth daily for 5 days. 5 tablet 0   FLUoxetine (PROZAC) 20 MG capsule Take 20 mg by mouth daily.     guaiFENesin (MUCINEX) 600 MG 12 hr tablet Take 1 tablet (600 mg total) by mouth 2 (two) times daily. (Patient not taking: Reported on 07/17/2022) 60 tablet 1   levETIRAcetam (KEPPRA) 500 MG tablet Take 500 mg by mouth 2 (two) times  daily.     omeprazole (PRILOSEC) 40 MG capsule Take 40 mg by mouth daily.     ondansetron (ZOFRAN-ODT) 4 MG disintegrating tablet Take 1 tablet (4 mg total) by mouth every 8 (eight) hours as needed for nausea. 10 tablet 0   No current facility-administered medications for this encounter.     REVIEW OF SYSTEMS:  On review of systems, the patient;'s wife joined our call and states he is doing okay. His breathing is better since he was treated for pneumonia, but he still is short of breath at baseline. He is not having fevers at this time.  No other complaints are verbalized. She does state though that he's being worked up for an elevated PSA with Dr. Lovena Neighbours in Urology.   PHYSICAL EXAM:  Unable to assess  due to encounter type.  ECOG = 1  0 - Asymptomatic (Fully active, able to carry on all predisease activities without restriction)  1 - Symptomatic but completely ambulatory (Restricted in physically strenuous activity but ambulatory and able to carry out work of a light or sedentary nature. For example, light housework, office work)  2 - Symptomatic, <50% in bed during the day (Ambulatory and capable of all self care but unable to carry out any work activities. Up and about more than 50% of waking hours)  3 - Symptomatic, >50% in bed, but not bedbound (Capable of only limited self-care, confined to bed or chair 50% or more of waking hours)  4 - Bedbound (Completely disabled. Cannot carry on any self-care. Totally confined to bed or chair)  5 - Death   Eustace Pen MM, Creech RH, Tormey DC, et al. [phone]). "Toxicity and response criteria of the [facility]". [place] Oncol. 5 (6): 649-55  Alert, no distress   LABORATORY DATA:  Lab Results  Component Value Date   WBC 6.9 03/10/2022   HGB 12.4 (L) 03/10/2022   HCT 36.2 (L) 03/10/2022   MCV 91.4 03/10/2022   PLT 253 03/10/2022   Lab Results  Component Value Date   NA 137 03/10/2022   K 3.6 03/10/2022   CL 104  03/10/2022   CO2 24 03/10/2022   Lab Results  Component Value Date   ALT 10 03/09/2022   AST 12 (L) 03/09/2022   GGT 25 12/18/2019   ALKPHOS 62 03/09/2022   BILITOT 0.9 03/09/2022      RADIOGRAPHY: CT CHEST W CONTRAST  Result Date: 08/16/2022 CLINICAL DATA:  Non-small-cell lung cancer. Restaging. * Tracking Code: BO * . EXAM: CT CHEST WITH CONTRAST TECHNIQUE: Multidetector CT imaging of the chest was performed during intravenous contrast administration. RADIATION DOSE REDUCTION: This exam was performed according to the departmental dose-optimization program which includes automated exposure control, adjustment of the mA and/or kV according to patient size and/or use of iterative reconstruction technique. CONTRAST:  91mL OMNIPAQUE IOHEXOL 300 MG/ML  SOLN COMPARISON:  02/10/2022 FINDINGS: Cardiovascular: The heart size is normal. No substantial pericardial effusion. Mild atherosclerotic calcification is noted in the wall of the thoracic aorta. Mediastinum/Nodes: No mediastinal lymphadenopathy. There is no hilar lymphadenopathy. The esophagus has normal imaging features. There is no axillary lymphadenopathy. Lungs/Pleura: Centrilobular emphsyema noted. Interval increase in subsegmental atelectasis in the right middle lobe. Interstitial coarsening in the medial left upper lobe is stable compatible with the sequelae of radiation therapy. The subpleural nodule measured previously at 10 x 6 mm is 7 x 4 mm today on image 28/5. Medial left upper lobe nodule measured at 5 mm long axis previously is now 4 mm on image 67/5. As noted previously, this nodule demonstrates long-term interval stability consistent with benign etiology. No new suspicious pulmonary nodule or mass. No focal airspace consolidation. No pleural effusion. Upper Abdomen: 2.2 cm exophytic lesion upper interpolar right kidney has attenuation too high to be a simple cyst. This was better evaluated by CT abdomen of 01/03/2022 and characterized as  stable back to 2021 at that time, suggesting hemorrhagic or proteinaceous cyst. Upper pole nodules in the right kidney are also stable. Musculoskeletal: No worrisome lytic or sclerotic osseous abnormality. IMPRESSION: 1. Stable exam. No new or progressive interval findings. 2. Postradiation changes in the medial left upper lobe with mild decrease in size of the left upper lobe pulmonary nodule. 3. Aortic Atherosclerosis (ICD10-I70.0) and Emphysema (ICD10-J43.9). Electronically  Signed   By: Misty Stanley M.D.   On: 08/16/2022 15:44       IMPRESSION/ PLAN:  Stage IA2, cT1bN0M0, NSCLC, adenocarcinoma of the LUL. The patient is doing well radiographically and stable clinically. We will plan repeat CT in 6 months time or sooner if he develops progressive symptoms.  COPD. He will follow up with Dr. Valeta Harms for management of this. Elevated PSA. The patient will follow up with Dr. Lovena Neighbours for an MRI and possible biopsy.      This encounter was conducted via telephone.  The patient has provided two factor identification and has given verbal consent for this type of encounter and has been advised to only accept a meeting of this type in a secure network environment. The time spent during this encounter was 35 minutes including preparation, discussion, and coordination of the patient's care. The attendants for this meeting include  Hayden Pedro  and Maude Leriche. Mr. Mondry was sleeping at home. During the encounter,  Hayden Pedro was located at [facility] Radiation Oncology Department.  VIRGINIO ISIDORE was located at home with his wife Ly Bacchi     Carola Rhine, [facility]

## 2022-08-24 NOTE — Patient Instructions (Signed)
Formatting of this note might be different from the original.  Images from the original note were not included.    Insurance:  Our hospital admissions office will review your insurance information prior to the procedure.  If a prior authorization or precertification is needed, the admissions office will make sure it is completed several days before your procedure.  Our admissions office will only contact you if they need more information from you or if any issues arise.    Procedure Information:  Cardiac device implantation involves making an incision in the left (sometimes right) upper part of the chest just below the collarbone and placement of generator (battery) just underneath the skin (sometimes muscle).  Using [employer], a thin, flexible wire called a lead (sometimes more than one) is inserted through the skin and into a vein also located in the upper portion of the chest and then threaded into the heart.  One end of the lead is connected to the generator and the other end touches the heart muscle.     The procedure in performed in the Electrophysiology or EP Lab.  Procedure length varies depending on the type of device being implanted (anywhere from 30 minutes to 1.5 hours).  Patients are sedated during the procedure using general anesthesia for defibrillator implants and conscious sedation for pacemaker implant.  Conscious sedation means you are sleepy and comfortable, but still able to respond and answer questions.  General anesthesia means you are completely asleep.     Preparing for your procedure:  You will need to stay in the hospital overnight after the procedure for monitoring and you will be discharged the day after your procedure around noon.  Please make arrangements for someone to drive you home.  This applies to new device implants, placement of additional leads, and lead revisions.    Day of the procedure:  After you check in, you will go to the prep and recovery area where team members  from the EP Lab and Anesthesia will meet with you to review your health history and perform a physical exam.     They will review the procedure with you and answer any questions that you may have.      The nurses will place an intravenous (IV) line and draw blood for standard testing.      The procedure will start about 2 hours after your arrival and once complete you will return to the prep and recovery area for monitoring.    You will meet one of the assistant EP physicians (fellows) who will review the details as well as risks and benefits of the procedure and obtain your signature for consent forms.    You will return to the prep and recovery area for monitoring and then you will be taken to your room where you will stay overnight, if applicable.    For patients staying overnight, you will have your own room to stay in overnight and there is space for a friend/family member to stay in the room overnight with you.     On rare occasion, patients need to stay overnight in a room that does not accommodate a friend/family member overnight.  Should this happen, Guest Services will assist your friend/family in making arrangements to stay at a nearby hotel.    If you were not given a follow-up appointment prior to leaving the hospital, please call [phone] to schedule your follow-up visit.    At home:  You may remove your dressing 48 hours  after surgery and leave the incision uncovered. Keep incision site completely dry for one week. During this week you may take a sponge bath, but no shower.     After one week, you may shower, cleaning the wound with soap and water.     Avoid manipulating the device with your fingers.  Do not massage or excessively rub the implant site.    Do not use any lotions, creams or ointments on the incision. Notify your doctors and dentist that you have an implanted cardiac device.     For patients with new device implants, placement of additional leads, and lead revisions, do not do the  following for four weeks after your procedure:     Do not raise your elbow above the level of your shoulder on the side of the device implant.    Do not lift anything heavier than one gallon of milk.    Avoid excessive pushing, pulling or twisting.    For patient with a new subcutaneous defibrillator (lead placed under the skin and no lead inside the heart), do not do the following for one week after your procedure:    Do not raise your elbow above the level of your shoulder on the side of the device implant     Do not lift anything heavier than one gallon of milk.    Avoid excessive pushing, pulling or twisting.  Electronically signed by Jilda Panda, DNP at 08/24/2022 11:09 AM EDT

## 2022-08-24 NOTE — Progress Notes (Signed)
Formatting of this note is different from the original.  Images from the original note were not included.    [facility] Visit     Encounter Date: 08/24/2022    Patient: Ruben [name]  MRN: [id number]  Date of Birth: 08-22-1951  Electrophysiology Physician: Dr. Conan Bowens     Chief Complaint:  Follow-up (Ventricular tachycardia)    History of Present Illness:   Ruben [name] is a 71 y.o. male with a medical history of rheumatic aortic stenosis s/p TAVR 82/4235, chronic diastolic heart failure, s/p ICD, VT s/p ablation 06/2021.  He experienced multiple episodes of unheralded syncope in the fall 2021.  He underwent dual-chamber pacemaker implantation at [facility] in 08/2020.  He then presented with recurrent ventricular tachycardia in 10/2020 and subsequently underwent removal and replacement of his dual-chamber pacemaker with a dual-chamber ICD (Abbott). In February and March of 2022 he presented with multiple episodes of rapid VT and was placed on amiodarone in 01/2021. He followed up with Dr Donnamarie Poag 03/2021 with plan for VT ablation 06/2021.  He presented to the ER two weeks prior to scheduled ablation  With CP and multiple episodes of VT and underwent VT ablation 06/15/2021 c/b pericardial effusion requiring drain placement. His amiodarone was stopped at discharge. Clinic follow up 01/2022 he was feeling well with AMS episodes on ICD that appeared to be false positive due to competitive atrial pacing - longest lasting 8 seconds.   Last visit 07/20/2022 ICD interrogation revealed underlying intact conduction at 70 bpm, so LRL was decreased to 60 bpm.    Today, Ruben [name] returns for arrhythmia and device follow up.  He reports occasional fatigue, feels his exercise stamina could be better - but denies any physical limitations.  He continues to RV pace 100% of the time.  He expresses concern for long term  chronic RV pacing.  He denies palpitations, near-syncope or syncope, dizziness, or  lightheadedness. Denies chest pain or dyspnea at rest or with exertion. He denies significant weight gain, orthopnea, PND or significant lower extremity edema. Denies any easy bleeding/bruising or bloody stools.      CHA2DS2-VASc: 3  Current anticoagulant/antiplatelet medications: Aspirin 81mg  daily  Current antiarrhythmic medications: none  Current rate control medications: Metoprolol tartrate 25mg  bid    Device: Abbott DC ICD    Medical History:  He  has a past medical history of Aortic stenosis, Aortic valve stenosis, B12 deficiency, BPH (benign prostatic hyperplasia), Cancer, Depression, Heart murmur, Hyperlipidemia, Hypertension, Presence of combination internal cardiac defibrillator (ICD) and pacemaker, and Thyroid disease.    Surgical History:  He  has a past surgical history that includes Neck surgery (03/20/2017); Colonoscopy (2009, 2019); upper endoscopy (egd) (2019); Glossectomy (02/13/2017); Dupuytren contracture release (Right); Wisdom tooth extraction; replace aortic valve perq femoral artry approach (Bilateral, 08/27/2019); pr colonoscopy flx dx w/collj spec when pfrmd (N/A, 12/22/2021); and pr part palmar fasciec,open 1 digit (Right, 06/29/2022).    Social History: He  reports that he has never smoked. He has never been exposed to tobacco smoke. He has never used smokeless tobacco. He reports that he does not currently use alcohol. He reports that he does not use drugs.    Family History: He family history includes COPD in his mother; Heart failure in his father; Valvular heart disease in his mother.    Allergies:He has No Known Allergies.     Current Outpatient Medications:    Current Outpatient Medications   Medication Sig    aspirin 81 mg chewable  tablet Chew 1 tablet daily.    fludrocortisone (Florinef) 0.1 mg tablet TAKE 1 TABLET BY MOUTH ONE TIME DAILY    levothyroxine (Synthroid) 175 mcg tablet Take 1 tablet by mouth every morning.    metoprolol tartrate (Lopressor) 25 mg tablet TAKE 1 TABLET BY  MOUTH TWICE A DAY    midodrine (Proamatine) 5 mg tablet TAKE 1 TABLET BY MOUTH 3 TIMES DAILY. DO NOT TAKE WITHIN 4 HOURS OF BEDTIME. (Patient taking differently: Take 1 tablet by mouth 2 times daily. Do not take within 4 hours of bedtime.)    silodosin (RAPAFLO) 8 mg capsule Take 1 capsule by mouth at bedtime.    venlafaxine (Effexor XR) 75 mg extended release 24 hr capsule TAKE 1 CAPSULE BY MOUTH EVERYDAY AT BEDTIME     Review of Systems:  An extensive ROS was reviewed and all others negative, except as noted above.     Physical Exam:  BP 136/71 (BP Location: Left arm, Patient Position: Sitting, BP CUFF SIZE: Adult Long)   Pulse 61   Temp 36.5 C (97.7 F) (Temporal)   Resp 16   Ht 182.9 cm (6')   Wt (!) 101.7 kg (224 lb 3.2 oz)   SpO2 99%   BMI 30.41 kg/m   GEN:  NAD, Cooperative  HEENT: WNL, Normocephalic   NECK:   Supple, No JVD at 90 degrees  CV: Regular rate and rhythm, No audible murmurs, rubs or gallops   PULM: Clear to auscultation bilaterally with fair respiratory effort  EXT: Warm and well perfused, No cyanosis or lower extremity edema    Neuro: AAO x 3  Psych: Normal affect and mood     Labs/Imaging:  Lab Results   Component Value Date    WBC 4.20 05/11/2022    HGB 12.3 05/11/2022    HCT 37.1 05/11/2022    MCV 90.5 05/11/2022    PLT 218 05/11/2022     Lab Results   Component Value Date    CREATININE 1.1 05/31/2022    BUN 21 05/31/2022    NA 140.0 05/31/2022    K 4.2 05/31/2022    CL 106 05/31/2022    CO2CT 25 05/31/2022     Lab Results   Component Value Date    MG 2.0 06/18/2021     Lab Results   Component Value Date    INR 1.14 08/29/2019    INR 1.03 08/27/2019    INR 0.98 07/17/2019     Echo 04/2022:  Left Ventricle:   Left ventricular ejection fraction was measured by Simpson's biplane method at 45 %.   There is paradoxical or dyssynergic septal motion consistent with RV pacemaker.   LV regional wall motion is abnormal - the apex is hypokinetic and the distal inferior wall is hypokinetic.    Stage I diastolic dysfunction. LV filling pressures are estimated to be normal.  Right Ventricle:   The right ventricle is normal in size.   Right ventricular systolic function is normal.  Right Atrium:   There is a mobile echogenicity noted in the RA. This could represent fibrinous material attached to the Pacing leads vs  thrombus vs Prominent Chiari network. Appears to be present on prior study, although better visualized on this study.  Aortic Valve:   There is a bioprosthetic valve in the aortic position consistent with prior TAVI.   With Vmax 2.14m/s, mean PG , DI0.41.   There is mild - moderate peri-valvular regurgitation - PHT .  Mitral Valve:  Mild , eccentric and posteriorly directed mitral regurgitation is present.  Tricuspid Valve:   Mild tricuspid regurgitation present.   The right ventricular systolic pressure is estimated to be 24 mmHg.  Overall Conclusions:   Compared to prior study, EF is mildly reduced with new wall motion abnormalities as noted above. Peri-prosthetic  regurgitation appears similar to prior study.    Device Interrogation 08/24/2022:    Assessment & Plan:  Ruben [name] is a 71 y.o. male with a medical history of rheumatic aortic stenosis s/p TAVR 08/2019, chronic diastolic heart failure, s/p ICD, VT s/p ablation 06/2021. Last visit 07/20/2022 ICD interrogation revealed increased RV pacing and underlying intact conduction at 70bpm, so LRL decreased to 60 bpm - but he continues to RV pace all of the time.  Last echo revealed reduced LVEF of 45% (04/2022) from 63% (06/2021).    We discussed in detail the potential benefits of CRT-D therapy and detrimental effects of chronic RV pacing including the DAVID trial that chronic RV pacing may exacerbate heart failure. Additionally, we discussed the potential risks of CRT-D upgrade, including the risk of bleeding, infection, venous occlusion, DVT/PE, pneumothorax, cardiac tamponade, perforation, need for urgent open heart surgery,  device/lead failure, lead dislodgement, inappropriate shock(s), heart attack, stroke, arrhythmia, radiation skin injury, kidney damage/failure oversedation, respiratory arrest, and even death.  The patient understands these risks in the context of the potential benefits of the device upgrade, and agrees to proceed.      Reviewed medications with patient today.  Medication list up to date.  Time given to ask questions.  Answered all questions.  Instructed today to call or come back if there are any changes in health status, worsening or new symptoms.  If emergent go directly to the emergency room.     Follow up:   Pending CRT-D upgrade    G. Annabelle Harman, DNP, FNP-BC  Cardiac Electrophysiology Nurse Practitioner  [facility]    Total time independently spent on today's visit was 36 minutes. This time included: face-to-face time evaluating the patient as well as additional non-face-to-face time spent on: Preparing to see the patient by obtaining and reviewing previous test results, records, and medical history, Performing a medically appropriate history and exam and documenting relevant clinical information for this visit, Counseling and educating patient, and Communicating with other health care professionals.  Electronically signed by Jilda Panda, DNP at 09/04/2022  9:58 AM EDT

## 2022-09-20 ENCOUNTER — Other Ambulatory Visit (HOSPITAL_COMMUNITY): Payer: Self-pay | Admitting: Neurosurgery

## 2022-09-20 DIAGNOSIS — D329 Benign neoplasm of meninges, unspecified: Secondary | ICD-10-CM

## 2022-09-21 ENCOUNTER — Telehealth: Payer: Self-pay | Admitting: Pulmonary Disease

## 2022-09-21 NOTE — Telephone Encounter (Signed)
Attempted to call pt's spouse [name] but unable to reach. Left message for her to return call.

## 2022-09-22 MED ORDER — ALBUTEROL SULFATE (2.5 MG/3ML) 0.083% IN NEBU: 2.5000 mg | INHALATION_SOLUTION | Freq: Four times a day (QID) | RESPIRATORY_TRACT | 12 refills | Status: DC | PRN

## 2022-09-22 NOTE — Telephone Encounter (Signed)
Spoke with [name] (pt's wife per [facility]) who is requesting albuterol neb from [place]. Order placed. Nothing further needed at this time.

## 2022-09-22 NOTE — Addendum Note (Signed)
Addended by: Gavin Potters R on: 09/22/2022 04:25 PM   Modules accepted: Orders

## 2022-10-04 ENCOUNTER — Ambulatory Visit (INDEPENDENT_AMBULATORY_CARE_PROVIDER_SITE_OTHER): Payer: No Typology Code available for payment source | Admitting: Nurse Practitioner

## 2022-10-04 ENCOUNTER — Encounter: Payer: Self-pay | Admitting: Nurse Practitioner

## 2022-10-04 ENCOUNTER — Ambulatory Visit (INDEPENDENT_AMBULATORY_CARE_PROVIDER_SITE_OTHER): Payer: Medicare Other

## 2022-10-04 VITALS — BP 114/72 | HR 71 | Ht 69.0 in | Wt 184.4 lb

## 2022-10-04 DIAGNOSIS — J4489 Other specified chronic obstructive pulmonary disease: Secondary | ICD-10-CM

## 2022-10-04 DIAGNOSIS — J441 Chronic obstructive pulmonary disease with (acute) exacerbation: Secondary | ICD-10-CM | POA: Diagnosis not present

## 2022-10-04 DIAGNOSIS — C3412 Malignant neoplasm of upper lobe, left bronchus or lung: Secondary | ICD-10-CM

## 2022-10-04 MED ORDER — PREDNISONE 10 MG PO TABS: ORAL_TABLET | ORAL | 0 refills | Status: DC

## 2022-10-04 MED ORDER — METHYLPREDNISOLONE ACETATE 80 MG/ML IJ SUSP
80.0000 mg | Freq: Once | INTRAMUSCULAR | Status: AC
  Administered 2022-10-04: 80 mg via INTRAMUSCULAR

## 2022-10-04 MED ORDER — DOXYCYCLINE HYCLATE 100 MG PO TABS: 100.0000 mg | ORAL_TABLET | Freq: Two times a day (BID) | ORAL | 0 refills | Status: AC

## 2022-10-04 NOTE — Progress Notes (Signed)
No evidence of pna on CXR today. Continue with plan as discussed earlier. Thanks!

## 2022-10-04 NOTE — Assessment & Plan Note (Addendum)
Very severe COPD with asthmatic component, now with exacerbation. Non-toxic and VS stable. We will treat him with depo injection, steroid taper and empiric doxycycline course. CXR to evaluate for superimposed infection. Maintained on triple therapy regimen. Advised he add on mucinex and flutter for mucociliary clearance.  Patient Instructions  Continue Breztri 2 puffs Twice daily. Brush tongue and rinse mouth afterwards  Continue Albuterol inhaler 2 puffs or 3 mL neb every 6 hours as needed for shortness of breath or wheezing. Notify if symptoms persist despite rescue inhaler/neb use. Do not use your albuterol more than prescribed! Continue omeprazole 40 mg daily   Restart Mucinex 600 mg Twice daily for chest congestion Restart Flutter valve 2-3 times a day  Prednisone taper. 4 tabs for 2 days, then 3 tabs for 2 days, 2 tabs for 2 days, then 1 tab for 2 days, then stop. Take in AM with food. Start tomorrow  Doxycycline 1 tab Twice daily for 7 days. Take with food. Wear sunscreen and avoid excessive sun exposure while taking this medication as it increases your risk for sunburns.  Chest x ray today    Follow up in 2 weeks with Dr. Valeta Harms or Joellen Jersey Adaliz Dobis,NP. If symptoms do not improve or worsen, please contact office for sooner follow up or seek emergency care.

## 2022-10-04 NOTE — Assessment & Plan Note (Signed)
S/p SBRT. Recent CT chest from 10/10 showed interval decrease in size of LUL nodule and radiation changes. Follow up with radiation oncology as scheduled.

## 2022-10-04 NOTE — Patient Instructions (Addendum)
Continue Breztri 2 puffs Twice daily. Brush tongue and rinse mouth afterwards  Continue Albuterol inhaler 2 puffs or 3 mL neb every 6 hours as needed for shortness of breath or wheezing. Notify if symptoms persist despite rescue inhaler/neb use. Do not use your albuterol more than prescribed! Continue omeprazole 40 mg daily   Restart Mucinex 600 mg Twice daily for chest congestion Restart Flutter valve 2-3 times a day  Prednisone taper. 4 tabs for 2 days, then 3 tabs for 2 days, 2 tabs for 2 days, then 1 tab for 2 days, then stop. Take in AM with food. Start tomorrow  Doxycycline 1 tab Twice daily for 7 days. Take with food. Wear sunscreen and avoid excessive sun exposure while taking this medication as it increases your risk for sunburns.  Chest x ray today    Follow up in 2 weeks with Dr. Valeta Harms or Joellen Jersey Lulabelle Desta,NP. If symptoms do not improve or worsen, please contact office for sooner follow up or seek emergency care.

## 2022-10-04 NOTE — Progress Notes (Addendum)
@Patient  ID: Bruce Sullivan, male    DOB: 08/03/1951, 71 y.o.   MRN: [id number]  Chief Complaint  Patient presents with   Acute Visit    Pt has been coughing, and having wheezing, SOB x1 month    Referring provider: Wenda Low, MD  HPI: 71 year old male, former smoker followed for COPD with emphysema and adenocarcinoma of the left lung status post SBRT.  He is a patient of Dr. Juline Patch and last seen in office 07/17/2022.  Past medical history significant for CKD, hypertension, HLD, TIA, stroke, GERD, seizure disorder, BPH, GAD, meningioma.   TEST/EVENTS:  07/13/2021 PFTs: FVC 56, FEV1 31, ratio 50, TLC 109, DLCO corrected 58.  Very severe obstructive lung disease with significant bronchodilator response (47% change) 02/10/2022 CT chest w contrast: atherosclerosis. Loop recorder in place. Right infrahilar lymph node 0.9, not hypermetabolic on previous PET and relatively unchanged in size. The medial LUL nodule measures 1x0.6 cm formerly 1.7x0.9 cm. Adjacent interstitial accentuation and hazy ground-glass opacity compatible with SBRT. Stable 5x3 mm nodule in the LUL.  05/17/2022 CXR 2 view: Patchy airspace disease in the lower lungs, suggestive of pneumonia. 08/15/2022 CT chest w con: atherosclerosis. Centrilobular emphysema noted. Interval increase in subsegmental atelectasis in the RML. Interstitial coarsening in the LUL, stable and sequelae of radiation therapy. Subpleural nodule measured 7x4 mm, previously 10x6   01/20/2022: OV with Dr. Valeta Harms.  Follow-up for COPD.  He was previously diagnosed with adenocarcinoma of the left lung status post SBRT.  During work-up, he was found to have a brain mass concerning for meningioma.  He was seen by neurosurgery who need his loop recorder removed so they can obtain MRI of the brain.  He is awaiting to see cardiology at the [facility] for this.  From a respiratory standpoint he is able to do most of his activities of daily living.  He does walk slower and with a  cane.  He does have some shortness of breath.  His wife is concerned but he feels like he has been stable.  He did well with the samples of Breztri and would like to stay on it if possible.  Advised to follow-up in 6 months.  05/17/2022: OV with Caylynn Minchew NP for increased shortness of breath over the last month.  She helps to provide his history. He has been coughing more recently and producing mucus, but she is not entirely sure what color it is. He also has some chest congestion/tightness and associated wheezing when he gets short of breath. He is feeling more tired and weaker than normal. Eating and drinking well though. They deny any recent fevers, chills, night sweats, hemoptysis, weight loss, chest pain, leg swelling. He has not been using a maintenance inhaler regularly and they are a little unclear on which inhalers he has at home. After investigation, discovered he only has [place] at home. He previously was on [place] and doing better, but he ran out of refills so his [place] doctor switched him. He uses his rescue albuterol inhaler or neb multiple times a day. CXR revealed patchy infiltrates, consistent with pneumonia - treated with augmentin course and prednisone burst for AECOPD. Stopped Wixela and stepped up to [employer]. Close follow up.  06/01/2022: OV with Ilya Ess NP for follow up after being treated for pneumonia and AECOPD. She again helps to provide history. His cough has improved and is no longer productive. He is also feeling better and energy is better. He is eating and drinking as normal again. Unfortunately,  his breathing is minimally improved. He felt better on the prednisone but since completing, feels like his breathing is worse. He gets winded even just walking to the bathroom. Uses his albuterol nebulizer every hour while awake, per his wife. He also uses his albuterol rescue inhaler throughout the day. He is using Breztri Twice daily. He is still using mucinex Twice daily. He was supposed to use  flutter valve 2-3 times a day but his wife reports he hasn't been consistent with this. Treated for unresolved AECOPD with prednisone taper. Plan to repeat CXR in 4 weeks  06/16/2022: OV with Anisha Starliper NP for follow up after being treated for persistent AECOPD after having pneumonia. He reports that he is feeling much better.  Feels like his breathing has improved after the last prednisone course.  He can walk to the bathroom and back again without feeling completely out of breath.  Cough has mostly resolved; occasionally will have some clear sputum.  No significant wheezing, chest congestion.  No recent fevers, chills.  Denies any hemoptysis, leg swelling, orthopnea, anorexia, weight loss.  He continues on [place] twice a day.  He does feel like this has helped.  He is using his albuterol a little less often.  However, his wife still reports that he uses his nebulizer frequently throughout the day.  She feels like it is a habit for him. Feels like it gives him more stamina.    10/04/2022: Today - acute Patient presents today for acute visit with his wife.  She tells me that over the last 3 to 4 weeks, he has had an increased productive cough with gray sputum production.  Feels like his sputum is also a lot thicker than it normally is.  He has been wheezing a lot more, especially over the last few days.  Feels like he is more short winded.  Using his albuterol very frequently again.  Denies any fevers, chills, hemoptysis, lower extremity swelling, orthopnea.  Using his Judithann Sauger twice daily still  Allergies  Allergen Reactions   Bee Venom Anaphylaxis    Face gets red and hard to breathe     Immunization History  Administered Date(s) Administered   Fluad Quad(high Dose 65+) 10/14/2020, 09/12/2021   Influenza Split 08/25/2014, 08/07/2019   Influenza, High Dose Seasonal PF 08/02/2018, 09/12/2021   Influenza, Seasonal, Injecte, Preservative Fre 08/25/2014   Influenza-Unspecified 08/25/2014, 08/28/2014,  10/04/2015, 08/07/2019, 08/31/2022   Moderna Sars-Covid-2 Vaccination 01/24/2020, 02/21/2020   Pneumococcal Conjugate-13 06/05/2017, 12/13/2017   Pneumococcal Polysaccharide-23 11/06/2012, 04/10/2015, 04/19/2015   Tdap 07/24/2014   Zoster Recombinat (Shingrix) 05/03/2021, 03/02/2022   Zoster, Live 03/02/2022    Past Medical History:  Diagnosis Date   Anxiety    Arthritis    Asthma    Atherosclerosis 01/16/2015   Noted on CXR   Bilateral lower extremity edema 01/16/2015   Chronic kidney disease (CKD), stage II (mild)    COPD (chronic obstructive pulmonary disease) (HCC)    Dementia (HCC)    Depression    Dyspnea    GERD (gastroesophageal reflux disease)    Gout    Hepatitis 12/2019   Hx Hep C positive in blood. Treated.   History of colon polyps    Hoarseness    Hyperlipidemia    Hypertension    Lung cancer ([place]) 09/07/2021   Neck abscess 09/03/2016   Pre-diabetes    Pulmonary emphysema ([place])    Seizure ([place])    Stroke [facility]) 2014   Thyroid disease    TIA (transient  ischemic attack)    Urinary frequency    Urine retention    UTI (urinary tract infection)     Tobacco History: Social History   Tobacco Use  Smoking Status Former   Types: E-cigarettes   Quit date: 01/2014   Years since quitting: 8.7  Smokeless Tobacco Never   Counseling given: Not Answered   Outpatient Medications Prior to Visit  Medication Sig Dispense Refill   acetaminophen (TYLENOL) 500 MG tablet Take 500 mg by mouth 2 (two) times daily as needed for pain.     albuterol (PROVENTIL) (2.5 MG/3ML) 0.083% nebulizer solution Take 3 mLs (2.5 mg total) by nebulization every 6 (six) hours as needed for wheezing or shortness of breath. 75 mL 12   allopurinol (ZYLOPRIM) 100 MG tablet Take 100 mg by mouth 2 (two) times daily.     amLODipine (NORVASC) 10 MG tablet Take 10 mg by mouth daily.     atorvastatin (LIPITOR) 10 MG tablet Take 10 mg by mouth at bedtime.     Budeson-Glycopyrrol-Formoterol  (BREZTRI AEROSPHERE) 160-9-4.8 MCG/ACT AERO Inhale 2 puffs into the lungs in the morning and at bedtime. 10.7 g 12   chlorthalidone (HYGROTON) 50 MG tablet Take 50 mg by mouth daily.     Cholecalciferol 50 MCG (2000 UT) TABS Take 2,000 Units by mouth daily.     FLUoxetine (PROZAC) 20 MG capsule Take 20 mg by mouth daily.     guaiFENesin (MUCINEX) 600 MG 12 hr tablet Take 1 tablet (600 mg total) by mouth 2 (two) times daily. 60 tablet 1   levETIRAcetam (KEPPRA) 500 MG tablet Take 500 mg by mouth 2 (two) times daily.     omeprazole (PRILOSEC) 40 MG capsule Take 40 mg by mouth daily.     ondansetron (ZOFRAN-ODT) 4 MG disintegrating tablet Take 1 tablet (4 mg total) by mouth every 8 (eight) hours as needed for nausea. 10 tablet 0   colchicine 0.6 MG tablet Take 1 tablet (0.6 mg total) by mouth daily for 5 days. 5 tablet 0   No facility-administered medications prior to visit.     Review of Systems:   Constitutional: No weight loss or gain, night sweats, fevers, chills. +fatigue, lassitude  HEENT: No headaches, difficulty swallowing, tooth/dental problems, or sore throat. No sneezing, itching, ear ache, nasal congestion, or post nasal drip CV:  No chest pain, orthopnea, PND, swelling in lower extremities, anasarca, dizziness, palpitations, syncope Resp: +shortness of breath with exertion; increased productive cough, wheezing. No hemoptysis.  No chest wall deformity GI:  No heartburn, indigestion, abdominal pain, nausea, vomiting, diarrhea, change in bowel habits, loss of appetite, bloody stools.  Skin: No rash, lesions, ulcerations MSK:  No joint pain or swelling.  No decreased range of motion.  No back pain. Neuro: No dizziness or lightheadedness.  Psych: No depression or anxiety. Mood stable.     Physical Exam:  BP 114/72 (BP Location: Right Arm, Patient Position: Sitting, Cuff Size: Normal)   Pulse 71   Ht 5\' 9"  (1.753 m)   Wt 184 lb 6.4 oz (83.6 kg)   SpO2 95%   BMI 27.23 kg/m    GEN: Pleasant, interactive, chronically ill appearing; in no acute distress. HEENT:  Normocephalic and atraumatic. PERRLA. Sclera white. Nasal turbinates pink, moist and patent bilaterally. No rhinorrhea present. Oropharynx pink and moist, without exudate or edema. No lesions, ulcerations, or postnasal drip.  NECK:  Supple w/ fair ROM. No JVD present. Normal carotid impulses w/o bruits. Thyroid symmetrical with no  goiter or nodules palpated. No lymphadenopathy.   CV: RRR, no m/r/g, no peripheral edema. Pulses intact, +2 bilaterally. No cyanosis, pallor or clubbing. PULMONARY:  Unlabored, regular breathing. Scattered wheezes bilaterally A&P. No accessory muscle use. No dullness to percussion. GI: BS present and normoactive. Soft, non-tender to palpation. No organomegaly or masses detected.  MSK: No erythema, warmth or tenderness. Cap refil <2 sec all extrem. No deformities or joint swelling noted.  Neuro: A/Ox3. No focal deficits noted.   Skin: Warm, no lesions or rashe Psych: Normal affect and behavior. Judgement and thought content appropriate.     Lab Results:  CBC    Component Value Date/Time   WBC 6.9 03/10/2022 0344   RBC 3.96 (L) 03/10/2022 0344   HGB 12.4 (L) 03/10/2022 0344   HCT 36.2 (L) 03/10/2022 0344   PLT 253 03/10/2022 0344   MCV 91.4 03/10/2022 0344   MCH 31.3 03/10/2022 0344   MCHC 34.3 03/10/2022 0344   RDW 12.0 03/10/2022 0344   LYMPHSABS 2.4 12/23/2021 1028   MONOABS 0.5 12/23/2021 1028   EOSABS 0.1 12/23/2021 1028   BASOSABS 0.0 12/23/2021 1028    BMET    Component Value Date/Time   NA 137 03/10/2022 0344   K 3.6 03/10/2022 0344   CL 104 03/10/2022 0344   CO2 24 03/10/2022 0344   GLUCOSE 124 (H) 03/10/2022 0344   BUN 19 03/10/2022 0344   CREATININE 1.30 (H) 08/15/2022 1353   CALCIUM 9.1 03/10/2022 0344   GFRNONAA >60 03/10/2022 0344   GFRAA >60 09/16/2018 1255    BNP    Component Value Date/Time   BNP 24.4 12/27/2020 0825      Imaging:  No results found.       Latest Ref Rng & Units 07/13/2021    2:40 PM  PFT Results  FVC-Pre L 2.09   FVC-Predicted Pre % 56   FVC-Post L 2.62   FVC-Predicted Post % 70   Pre FEV1/FVC % % 43   Post FEV1/FCV % % 50   FEV1-Pre L 0.89   FEV1-Predicted Pre % 31   FEV1-Post L 1.31   DLCO uncorrected ml/min/mmHg 13.99   DLCO UNC% % 55   DLCO corrected ml/min/mmHg 14.75   DLCO COR %Predicted % 58   DLVA Predicted % 79   TLC L 7.44   TLC % Predicted % 109   RV % Predicted % 224     No results found for: "NITRICOXIDE"      Assessment & Plan:   COPD with asthma (HCC) Very severe COPD with asthmatic component, now with exacerbation. Non-toxic and VS stable. We will treat him with depo injection, steroid taper and empiric doxycycline course. CXR to evaluate for superimposed infection. Maintained on triple therapy regimen. Advised he add on mucinex and flutter for mucociliary clearance.  Patient Instructions  Continue Breztri 2 puffs Twice daily. Brush tongue and rinse mouth afterwards  Continue Albuterol inhaler 2 puffs or 3 mL neb every 6 hours as needed for shortness of breath or wheezing. Notify if symptoms persist despite rescue inhaler/neb use. Do not use your albuterol more than prescribed! Continue omeprazole 40 mg daily   Restart Mucinex 600 mg Twice daily for chest congestion Restart Flutter valve 2-3 times a day  Prednisone taper. 4 tabs for 2 days, then 3 tabs for 2 days, 2 tabs for 2 days, then 1 tab for 2 days, then stop. Take in AM with food. Start tomorrow  Doxycycline 1 tab Twice daily for  7 days. Take with food. Wear sunscreen and avoid excessive sun exposure while taking this medication as it increases your risk for sunburns.  Chest x ray today    Follow up in 2 weeks with Dr. Valeta Harms or Joellen Jersey An Schnabel,NP. If symptoms do not improve or worsen, please contact office for sooner follow up or seek emergency care.    Malignant neoplasm of bronchus of  left upper lobe (HCC) S/p SBRT. Recent CT chest from 10/10 showed interval decrease in size of LUL nodule and radiation changes. Follow up with radiation oncology as scheduled.     I spent 35 minutes of dedicated to the care of this patient on the date of this encounter to include pre-visit review of records, face-to-face time with the patient discussing conditions above, post visit ordering of testing, clinical documentation with the electronic health record, making appropriate referrals as documented, and communicating necessary findings to members of the patients care team.  Clayton Bibles, NP 10/04/2022  Pt aware and understands NP's role.

## 2022-10-11 ENCOUNTER — Ambulatory Visit (HOSPITAL_COMMUNITY)
Admission: RE | Admit: 2022-10-11 | Discharge: 2022-10-11 | Disposition: A | Discharge status: Home / Self Care | Payer: No Typology Code available for payment source | Source: Ambulatory Visit | Attending: Neurosurgery | Admitting: Neurosurgery

## 2022-10-11 DIAGNOSIS — D329 Benign neoplasm of meninges, unspecified: Secondary | ICD-10-CM | POA: Insufficient documentation

## 2022-10-11 MED ORDER — GADOBUTROL 1 MMOL/ML IV SOLN
8.0000 mL | Freq: Once | INTRAVENOUS | Status: AC | PRN
  Administered 2022-10-11: 8 mL via INTRAVENOUS

## 2022-10-20 ENCOUNTER — Encounter: Payer: Self-pay | Admitting: Nurse Practitioner

## 2022-10-20 ENCOUNTER — Ambulatory Visit (INDEPENDENT_AMBULATORY_CARE_PROVIDER_SITE_OTHER): Payer: Medicare Other | Admitting: Nurse Practitioner

## 2022-10-20 VITALS — BP 130/66 | HR 85 | Temp 98.8°F | Ht 69.0 in | Wt 186.0 lb

## 2022-10-20 DIAGNOSIS — J4489 Other specified chronic obstructive pulmonary disease: Secondary | ICD-10-CM

## 2022-10-20 DIAGNOSIS — C3412 Malignant neoplasm of upper lobe, left bronchus or lung: Secondary | ICD-10-CM | POA: Diagnosis not present

## 2022-10-20 NOTE — Patient Instructions (Addendum)
Continue Breztri 2 puffs Twice daily. Brush tongue and rinse mouth afterwards  Continue Albuterol inhaler 2 puffs or 3 mL neb every 6 hours as needed for shortness of breath or wheezing. Notify if symptoms persist despite rescue inhaler/neb use. Do not use your albuterol more than prescribed! Continue omeprazole 40 mg daily  Continue Mucinex 600 mg one to two times daily for chest congestion Continue Flutter valve 2-3 times a day   Follow up in 3 months with Dr. Valeta Harms or Alanson Aly. If symptoms do not improve or worsen, please contact office for sooner follow up or seek emergency care.

## 2022-10-20 NOTE — Progress Notes (Signed)
@Patient  ID: Bruce Sullivan, male    DOB: Apr 06, 1951, 71 y.o.   MRN: [id number]  Chief Complaint  Patient presents with   Follow-up    Referring provider: Wenda Low, MD  HPI: 71 year old male, former smoker followed for COPD with emphysema and adenocarcinoma of the left lung status post SBRT.  He is a patient of Dr. Juline Patch and last seen in office 10/04/2022 by Belenda Cruise NP.  Past medical history significant for CKD, hypertension, HLD, TIA, stroke, GERD, seizure disorder, BPH, GAD, meningioma.   TEST/EVENTS:  07/13/2021 PFTs: FVC 56, FEV1 31, ratio 50, TLC 109, DLCO corrected 58.  Very severe obstructive lung disease with significant bronchodilator response (47% change) 02/10/2022 CT chest w contrast: atherosclerosis. Loop recorder in place. Right infrahilar lymph node 0.9, not hypermetabolic on previous PET and relatively unchanged in size. The medial LUL nodule measures 1x0.6 cm formerly 1.7x0.9 cm. Adjacent interstitial accentuation and hazy ground-glass opacity compatible with SBRT. Stable 5x3 mm nodule in the LUL.  05/17/2022 CXR 2 view: Patchy airspace disease in the lower lungs, suggestive of pneumonia. 08/15/2022 CT chest w con: atherosclerosis. Centrilobular emphysema noted. Interval increase in subsegmental atelectasis in the RML. Interstitial coarsening in the LUL, stable and sequelae of radiation therapy. Subpleural nodule measured 7x4 mm, previously 10x6  10/04/2022 CXR: no acute process  01/20/2022: OV with Dr. Valeta Harms.  Follow-up for COPD.  He was previously diagnosed with adenocarcinoma of the left lung status post SBRT.  During work-up, he was found to have a brain mass concerning for meningioma.  He was seen by neurosurgery who need his loop recorder removed so they can obtain MRI of the brain.  He is awaiting to see cardiology at the [facility] for this.  From a respiratory standpoint he is able to do most of his activities of daily living.  He does walk slower and with a cane.  He does have  some shortness of breath.  His wife is concerned but he feels like he has been stable.  He did well with the samples of Breztri and would like to stay on it if possible.  Advised to follow-up in 6 months.  05/17/2022: OV with Edvardo Honse NP for increased shortness of breath over the last month.  She helps to provide his history. He has been coughing more recently and producing mucus, but she is not entirely sure what color it is. He also has some chest congestion/tightness and associated wheezing when he gets short of breath. He is feeling more tired and weaker than normal. Eating and drinking well though. They deny any recent fevers, chills, night sweats, hemoptysis, weight loss, chest pain, leg swelling. He has not been using a maintenance inhaler regularly and they are a little unclear on which inhalers he has at home. After investigation, discovered he only has Wixela at home. He previously was on [place]ow and doing better, but he ran out of refills so his [place] doctor switched him. He uses his rescue albuterol inhaler or neb multiple times a day. CXR revealed patchy infiltrates, consistent with pneumonia - treated with augmentin course and prednisone burst for AECOPD. Stopped Wixela and stepped up to [employer]. Close follow up.  06/01/2022: OV with Zedric Deroy NP for follow up after being treated for pneumonia and AECOPD. She again helps to provide history. His cough has improved and is no longer productive. He is also feeling better and energy is better. He is eating and drinking as normal again. Unfortunately, his breathing is minimally improved. He  felt better on the prednisone but since completing, feels like his breathing is worse. He gets winded even just walking to the bathroom. Uses his albuterol nebulizer every hour while awake, per his wife. He also uses his albuterol rescue inhaler throughout the day. He is using Breztri Twice daily. He is still using mucinex Twice daily. He was supposed to use flutter valve 2-3 times  a day but his wife reports he hasn't been consistent with this. Treated for unresolved AECOPD with prednisone taper. Plan to repeat CXR in 4 weeks  06/16/2022: OV with Ileigh Mettler NP for follow up after being treated for persistent AECOPD after having pneumonia. He reports that he is feeling much better.  Feels like his breathing has improved after the last prednisone course.  He can walk to the bathroom and back again without feeling completely out of breath.  Cough has mostly resolved; occasionally will have some clear sputum.  No significant wheezing, chest congestion.  No recent fevers, chills.  Denies any hemoptysis, leg swelling, orthopnea, anorexia, weight loss.  He continues on Goulds twice a day.  He does feel like this has helped.  He is using his albuterol a little less often.  However, his wife still reports that he uses his nebulizer frequently throughout the day.  She feels like it is a habit for him. Feels like it gives him more stamina.    10/04/2022: OV with Xochilth Standish NP for acute visit with his wife.  She tells me that over the last 3 to 4 weeks, he has had an increased productive cough with gray sputum production.  Feels like his sputum is also a lot thicker than it normally is.  He has been wheezing a lot more, especially over the last few days.  Feels like he is more short winded.  Using his albuterol very frequently again.  Denies any fevers, chills, hemoptysis, lower extremity swelling, orthopnea.  Using his Judithann Sauger twice daily still. CXR without acute process. Treated for AECOPD with empiric doxycycline and prednisone.   10/20/2022: Today - follow up Patient presents today for two week follow up after being treated for AECOPD. His wife is with him. He tells me he is feeling better. Still has an occasional cough but seems back to his baseline. Sputum has cleared up. He does occasionally feel like it gets stuck. Breathing is better too. He's using his nebulizer less often. Haven't noticed as much  wheezing; still occasionally has some. Denies fevers, chills, hemoptysis. He is using his breztri twice daily. No concerns or complaints today.   Allergies  Allergen Reactions   Bee Venom Anaphylaxis    Face gets red and hard to breathe     Immunization History  Administered Date(s) Administered   Fluad Quad(high Dose 65+) 10/14/2020, 09/12/2021   Influenza Split 08/25/2014, 08/07/2019   Influenza, High Dose Seasonal PF 08/02/2018, 09/12/2021   Influenza, Seasonal, Injecte, Preservative Fre 08/25/2014   Influenza-Unspecified 08/25/2014, 08/28/2014, 10/04/2015, 08/07/2019, 08/31/2022   Moderna Sars-Covid-2 Vaccination 01/24/2020, 02/21/2020   Pneumococcal Conjugate-13 06/05/2017, 12/13/2017   Pneumococcal Polysaccharide-23 11/06/2012, 04/10/2015, 04/19/2015   Tdap 07/24/2014   Zoster Recombinat (Shingrix) 05/03/2021, 03/02/2022   Zoster, Live 03/02/2022    Past Medical History:  Diagnosis Date   Anxiety    Arthritis    Asthma    Atherosclerosis 01/16/2015   Noted on CXR   Bilateral lower extremity edema 01/16/2015   Chronic kidney disease (CKD), stage II (mild)    COPD (chronic obstructive pulmonary disease) ([place])  Dementia ([place])    Depression    Dyspnea    GERD (gastroesophageal reflux disease)    Gout    Hepatitis 12/2019   Hx Hep C positive in blood. Treated.   History of colon polyps    Hoarseness    Hyperlipidemia    Hypertension    Lung cancer ([place]) 09/07/2021   Neck abscess 09/03/2016   Pre-diabetes    Pulmonary emphysema ([place])    Seizure ([place])    Stroke ([place]) 2014   Thyroid disease    TIA (transient ischemic attack)    Urinary frequency    Urine retention    UTI (urinary tract infection)     Tobacco History: Social History   Tobacco Use  Smoking Status Former   Types: E-cigarettes   Quit date: 01/2014   Years since quitting: 8.7  Smokeless Tobacco Never   Counseling given: Not Answered   Outpatient Medications Prior to Visit  Medication  Sig Dispense Refill   acetaminophen (TYLENOL) 500 MG tablet Take 500 mg by mouth 2 (two) times daily as needed for pain.     albuterol (PROVENTIL) (2.5 MG/3ML) 0.083% nebulizer solution Take 3 mLs (2.5 mg total) by nebulization every 6 (six) hours as needed for wheezing or shortness of breath. 75 mL 12   allopurinol (ZYLOPRIM) 100 MG tablet Take 100 mg by mouth 2 (two) times daily.     amLODipine (NORVASC) 10 MG tablet Take 10 mg by mouth daily.     atorvastatin (LIPITOR) 10 MG tablet Take 10 mg by mouth at bedtime.     Budeson-Glycopyrrol-Formoterol (BREZTRI AEROSPHERE) 160-9-4.8 MCG/ACT AERO Inhale 2 puffs into the lungs in the morning and at bedtime. 10.7 g 12   chlorthalidone (HYGROTON) 50 MG tablet Take 50 mg by mouth daily.     Cholecalciferol 50 MCG (2000 UT) TABS Take 2,000 Units by mouth daily.     FLUoxetine (PROZAC) 20 MG capsule Take 20 mg by mouth daily.     guaiFENesin (MUCINEX) 600 MG 12 hr tablet Take 1 tablet (600 mg total) by mouth 2 (two) times daily. 60 tablet 1   levETIRAcetam (KEPPRA) 500 MG tablet Take 500 mg by mouth 2 (two) times daily.     omeprazole (PRILOSEC) 40 MG capsule Take 40 mg by mouth daily.     ondansetron (ZOFRAN-ODT) 4 MG disintegrating tablet Take 1 tablet (4 mg total) by mouth every 8 (eight) hours as needed for nausea. 10 tablet 0   predniSONE (DELTASONE) 10 MG tablet 4 tabs for 2 days, then 3 tabs for 2 days, 2 tabs for 2 days, then 1 tab for 2 days, then stop 20 tablet 0   colchicine 0.6 MG tablet Take 1 tablet (0.6 mg total) by mouth daily for 5 days. 5 tablet 0   No facility-administered medications prior to visit.     Review of Systems:   Constitutional: No weight loss or gain, night sweats, fevers, chills. +fatigue, lassitude (improved) HEENT: No headaches, difficulty swallowing, tooth/dental problems, or sore throat. No sneezing, itching, ear ache, nasal congestion, or post nasal drip CV:  No chest pain, orthopnea, PND, swelling in lower  extremities, anasarca, dizziness, palpitations, syncope Resp: +shortness of breath with exertion (baseline); improved cough; rare wheezing. No hemoptysis.  No chest wall deformity GI:  No heartburn, indigestion, abdominal pain, nausea, vomiting, diarrhea, change in bowel habits, loss of appetite, bloody stools.  Skin: No rash, lesions, ulcerations MSK:  No joint pain or swelling.  No decreased range of  motion.  No back pain. Neuro: No dizziness or lightheadedness.  Psych: No depression or anxiety. Mood stable.     Physical Exam:  BP 130/66 (BP Location: Right Arm, Patient Position: Sitting, Cuff Size: Normal)   Pulse 85   Temp 98.8 F (37.1 C) (Oral)   Ht 5\' 9"  (1.753 m)   Wt 186 lb (84.4 kg)   SpO2 96%   BMI 27.47 kg/m   GEN: Pleasant, interactive, chronically ill appearing; in no acute distress. HEENT:  Normocephalic and atraumatic. PERRLA. Sclera white. Nasal turbinates pink, moist and patent bilaterally. No rhinorrhea present. Oropharynx pink and moist, without exudate or edema. No lesions, ulcerations, or postnasal drip.  NECK:  Supple w/ fair ROM. No JVD present. Normal carotid impulses w/o bruits. Thyroid symmetrical with no goiter or nodules palpated. No lymphadenopathy.   CV: RRR, no m/r/g, no peripheral edema. Pulses intact, +2 bilaterally. No cyanosis, pallor or clubbing. PULMONARY:  Unlabored, regular breathing. Clear bilaterally A&P w/o wheezes/rales/rhonchi. No accessory muscle use. No dullness to percussion. GI: BS present and normoactive. Soft, non-tender to palpation. No organomegaly or masses detected.  MSK: No erythema, warmth or tenderness. Cap refil <2 sec all extrem. No deformities or joint swelling noted.  Neuro: A/Ox3. No focal deficits noted.   Skin: Warm, no lesions or rashe Psych: Normal affect and behavior. Judgement and thought content appropriate.     Lab Results:  CBC    Component Value Date/Time   WBC 6.9 03/10/2022 0344   RBC 3.96 (L)  03/10/2022 0344   HGB 12.4 (L) 03/10/2022 0344   HCT 36.2 (L) 03/10/2022 0344   PLT 253 03/10/2022 0344   MCV 91.4 03/10/2022 0344   MCH 31.3 03/10/2022 0344   MCHC 34.3 03/10/2022 0344   RDW 12.0 03/10/2022 0344   LYMPHSABS 2.4 12/23/2021 1028   MONOABS 0.5 12/23/2021 1028   EOSABS 0.1 12/23/2021 1028   BASOSABS 0.0 12/23/2021 1028    BMET    Component Value Date/Time   NA 137 03/10/2022 0344   K 3.6 03/10/2022 0344   CL 104 03/10/2022 0344   CO2 24 03/10/2022 0344   GLUCOSE 124 (H) 03/10/2022 0344   BUN 19 03/10/2022 0344   CREATININE 1.30 (H) 08/15/2022 1353   CALCIUM 9.1 03/10/2022 0344   GFRNONAA >60 03/10/2022 0344   GFRAA >60 09/16/2018 1255    BNP    Component Value Date/Time   BNP 24.4 12/27/2020 0825     Imaging:  MR BRAIN W WO CONTRAST  Result Date: 10/12/2022 CLINICAL DATA:  Meningioma ([place]) D32.9 (ICD-10-CM). Non-small cell lung cancer (NSCLC), monitor; Falls in patient with known lung cancer. EXAM: MRI HEAD WITHOUT AND WITH CONTRAST TECHNIQUE: Multiplanar, multiecho pulse sequences of the brain and surrounding structures were obtained without and with intravenous contrast. CONTRAST:  48mL GADAVIST GADOBUTROL 1 MMOL/ML IV SOLN COMPARISON:  MRI of the brain Mar 10, 2022 and February 21, 2022. FINDINGS: Brain: Small focus of restricted diffusion in the left corona radiata (series 5, image 32). No acute hemorrhage, hydrocephalus or extra-axial collection. Multiple small remote infarcts in the bilateral basal ganglia, thalami and on the right side of the pons with associated hemosiderin deposits. Scattered and confluent foci of T2 hyperintensity are seen within the white matter of the cerebral hemispheres, nonspecific, most likely related to chronic small vessel ischemia. Dural-based extra-axial lesion in the posterior aspect of the left middle cranial fossa measuring approximately 17 x 10 x 9 mm, with mild mass effect on the adjacent  brain parenchyma, stable when compared  to prior MRI. No other focus of abnormal contrast enhancement is identified. Vascular: Normal flow voids. Skull and upper cervical spine: Normal marrow signal. Sinuses/Orbits: Right maxillary sinus mucous retention cyst. The orbits are maintained. Other: Stable appearance of round lesion in the occipital subcutaneous, likely a sebaceous cyst. IMPRESSION: 1. Small acute/subacute infarct in the left corona radiata. 2. Stable left middle cranial fossa meningioma. 3. No evidence of intracranial metastatic disease. 4. Moderate chronic microvascular ischemic changes of the white matter. 5. Multiple small remote infarcts in the bilateral basal ganglia, thalami and on the right side of the pons. These results will be called to the ordering clinician or representative by the Radiologist Assistant, and communication documented in the PACS or [employer]. Electronically Signed   By: Pedro Earls M.D.   On: 10/12/2022 15:12   DG Chest 2 View  Result Date: 10/04/2022 CLINICAL DATA:  71 year old male with COPD EXAM: CHEST - 2 VIEW COMPARISON:  07/17/2022 FINDINGS: Cardiomediastinal silhouette unchanged in size and contour. No evidence of central vascular congestion. No interlobular septal thickening. Stigmata of emphysema, with increased retrosternal airspace, flattened hemidiaphragms, increased AP diameter, and hyperinflation on the AP view. No pneumothorax or pleural effusion. Coarsened interstitial markings, with no confluent airspace disease. No acute displaced fracture. Degenerative changes of the spine. IMPRESSION: Emphysema, without evidence of acute cardiopulmonary disease Electronically Signed   By: Corrie Mckusick D.O.   On: 10/04/2022 16:09    methylPREDNISolone acetate (DEPO-MEDROL) injection 80 mg     Date Action Dose Route User   10/04/2022 0900 Given 80 mg Intramuscular (Left Ventrogluteal) Powers, Paige A, CMA          Latest Ref Rng & Units 07/13/2021    2:40 PM  PFT Results   FVC-Pre L 2.09   FVC-Predicted Pre % 56   FVC-Post L 2.62   FVC-Predicted Post % 70   Pre FEV1/FVC % % 43   Post FEV1/FCV % % 50   FEV1-Pre L 0.89   FEV1-Predicted Pre % 31   FEV1-Post L 1.31   DLCO uncorrected ml/min/mmHg 13.99   DLCO UNC% % 55   DLCO corrected ml/min/mmHg 14.75   DLCO COR %Predicted % 58   DLVA Predicted % 79   TLC L 7.44   TLC % Predicted % 109   RV % Predicted % 224     No results found for: "NITRICOXIDE"      Assessment & Plan:   COPD with asthma (HCC) Severe COPD with recent exacerbation. Clinically improved; returned to baseline. Encouraged him to continue mucociliary clearance therapies to help with chronic bronchitis symptoms. He will remain on triple therapy regimen. Asthma/COPD action plan in place.  Patient Instructions  Continue Breztri 2 puffs Twice daily. Brush tongue and rinse mouth afterwards  Continue Albuterol inhaler 2 puffs or 3 mL neb every 6 hours as needed for shortness of breath or wheezing. Notify if symptoms persist despite rescue inhaler/neb use. Do not use your albuterol more than prescribed! Continue omeprazole 40 mg daily  Continue Mucinex 600 mg one to two times daily for chest congestion Continue Flutter valve 2-3 times a day   Follow up in 3 months with Dr. Valeta Harms or Alanson Aly. If symptoms do not improve or worsen, please contact office for sooner follow up or seek emergency care.    Malignant neoplasm of bronchus of left upper lobe (HCC) S/p SBRT. Recent CT chest from 10/10 showed interval decrease in  size of LUL nodule and radiation changes. Follow up with radiation oncology as scheduled.      I spent 28 minutes of dedicated to the care of this patient on the date of this encounter to include pre-visit review of records, face-to-face time with the patient discussing conditions above, post visit ordering of testing, clinical documentation with the electronic health record, making appropriate referrals as documented,  and communicating necessary findings to members of the patients care team.  Clayton Bibles, NP 10/20/2022  Pt aware and understands NP's role.

## 2022-10-20 NOTE — Assessment & Plan Note (Signed)
Severe COPD with recent exacerbation. Clinically improved; returned to baseline. Encouraged him to continue mucociliary clearance therapies to help with chronic bronchitis symptoms. He will remain on triple therapy regimen. Asthma/COPD action plan in place.  Patient Instructions  Continue Breztri 2 puffs Twice daily. Brush tongue and rinse mouth afterwards  Continue Albuterol inhaler 2 puffs or 3 mL neb every 6 hours as needed for shortness of breath or wheezing. Notify if symptoms persist despite rescue inhaler/neb use. Do not use your albuterol more than prescribed! Continue omeprazole 40 mg daily  Continue Mucinex 600 mg one to two times daily for chest congestion Continue Flutter valve 2-3 times a day   Follow up in 3 months with Dr. Valeta Harms or Alanson Aly. If symptoms do not improve or worsen, please contact office for sooner follow up or seek emergency care.

## 2022-10-20 NOTE — Assessment & Plan Note (Signed)
S/p SBRT. Recent CT chest from 10/10 showed interval decrease in size of LUL nodule and radiation changes. Follow up with radiation oncology as scheduled.

## 2022-11-01 NOTE — Progress Notes (Signed)
Formatting of this note is different from the original.  CARDIOLOGY CLINIC NOTE FOLLOW-UP    Ruben [name]  07/19/1951  [id number]  11/01/2022    PCP:  Rosette Reveal, MD    CHIEF COMPLAINT:   Chief Complaint   Patient presents with    Follow-up     HPI:   This patient is a 71 y.o. male returning to clinic for follow-up.  He states overall he has been feeling reasonably well since her last visit.  He did have a repeat echocardiogram showing a mild reduction in his EF to 45%.  He followed up with the EP and given his chronic RV pacing even at lower pacing thresholds he is scheduled under go an upgrade to a biventricular device next month.  He is not checking his blood pressure at home very often but states it is routinely 120-130 range.  He otherwise denies fever, chills, chest pain, shortness of breath, PND, orthopnea, syncope, palpitations or edema.    Review of Systems:  A complete review of systems was obtained, and in the absence of the findings indicated above, all other systems are otherwise negative.    Medical History:  This patient has a past medical history of Aortic stenosis, Aortic valve stenosis, B12 deficiency, BPH (benign prostatic hyperplasia), Cancer, Depression, Heart murmur, Hyperlipidemia, Hypertension, Presence of combination internal cardiac defibrillator (ICD) and pacemaker, and Thyroid disease.    Surgical History:  This patient has a past surgical history that includes Neck surgery (03/20/2017); Colonoscopy (2009, 2019); upper endoscopy (egd) (2019); Glossectomy (02/13/2017); Dupuytren contracture release (Right); Wisdom tooth extraction; replace aortic valve perq femoral artry approach (Bilateral, 08/27/2019); pr colonoscopy flx dx w/collj spec when pfrmd (N/A, 12/22/2021); and pr part palmar fasciec,open 1 digit (Right, 06/29/2022).    Family History:  This patient's family history includes COPD in his mother; Heart failure in his father; Valvular heart disease in his mother.    Social History:   This patient reports that he has never smoked. He has never been exposed to tobacco smoke. He has never used smokeless tobacco. He reports that he does not currently use alcohol. He reports that he does not use drugs.    Allergies:  Allergies as of 11/01/2022    (No Known Allergies)     Medications:    Current Outpatient Medications:     aspirin 81 mg chewable tablet, Chew 1 tablet daily., Disp: , Rfl:     fludrocortisone (Florinef) 0.1 mg tablet, TAKE 1 TABLET BY MOUTH ONE TIME DAILY, Disp: 90 tablet, Rfl: 3    levothyroxine (Synthroid) 175 mcg tablet, TAKE 1 TABLET BY MOUTH EVERY DAY IN THE MORNING, Disp: 90 tablet, Rfl: 3    metoprolol tartrate (Lopressor) 25 mg tablet, TAKE 1 TABLET BY MOUTH TWICE A DAY, Disp: 180 tablet, Rfl: 1    midodrine (Proamatine) 5 mg tablet, TAKE 1 TABLET BY MOUTH 3 TIMES DAILY. DO NOT TAKE WITHIN 4 HOURS OF BEDTIME. (Patient taking differently: Take 1 tablet by mouth 2 times daily. Do not take within 4 hours of bedtime.), Disp: 90 tablet, Rfl: 3    silodosin (RAPAFLO) 8 mg capsule, Take 1 capsule by mouth at bedtime., Disp: , Rfl:     venlafaxine (Effexor XR) 75 mg extended release 24 hr capsule, TAKE 1 CAPSULE BY MOUTH EVERYDAY AT BEDTIME, Disp: 90 capsule, Rfl: 3    Physical Exam:  BP 152/69   Pulse 63   Resp 16   Ht 182.9 cm (6')   Wt Marland Kitchen)  101.6 kg (224 lb)   SpO2 100%   BMI 30.38 kg/m   General:  Alert and oriented to person, place, and time.  No apparent distress.  HENT:  Normocephalic, atraumatic.  Eyes:  Pupils equal, round, and reactive to light.  Extraocular movements intact bilaterally.  Sclera anicteric.  Neck:  Supple. Trachea midline. No carotid bruit auscultated  Cardiovascular:  Regular rate and rhythm.  Normal S1, S2.  2/6 systolic murmur heard best at the upper sternal border.  No  rubs, or gallops. JVP normal.  ICD site appears clean dry and intact  Respiratory:  Lungs clear to auscultation bilaterally with normal excursion bilaterally.  Back:  No costovertebral  tenderness.  Abdominal:  Normoactive bowel sounds.  Soft.  Nontender and nondistended.  No hepatosplenomegaly.  Genitourinary:  Deferred.  Neurologic:  Cranial nerves II-XII symmetric, otherwise nonfocal.  Extremities:  No cyanosis, clubbing, or edema.  Psychiatric:  Normal mood and affect.    Labs:      Lab Results   Component Value Date    NA 140.0 05/31/2022    K 4.2 05/31/2022    CL 106 05/31/2022    CO2CT 25 05/31/2022    ANIONGAP 9 05/31/2022    GLUCOSE 89.0 05/31/2022    BUN 21 05/31/2022    CREATININE 1.1 05/31/2022    CALCIUM 9.6 05/31/2022    EGFR >60 05/31/2022    HEMOLYSISIND 0 08/29/2019    HEMOLYSISIND 0 08/29/2019     Lab Results   Component Value Date    MG 2.0 06/18/2021     No results found for: "PHOS"  Lab Results   Component Value Date    INR 1.14 08/29/2019     Lab Results   Component Value Date    WBC 4.20 05/11/2022    RBC 4.10 05/11/2022    HCT 37.1 05/11/2022    MCV 90.5 05/11/2022    MCH 30.1 05/11/2022    MCHC 33.3 05/11/2022    RDW 14.3 05/11/2022     Lab Results   Component Value Date    TRIG 184 (H) 07/31/2022    CHOL 192 07/31/2022    HDL 35 (L) 07/31/2022     No results found for: "HGBA1C"    ECHO:  Results for orders placed or performed during the hospital encounter of 04/18/22   Echocardiogram Transthoracic (TTE) Complete   Result Value Ref Range    EJECTION FRACTION 45 %   Left Ventricle:   Left ventricular ejection fraction was measured by Simpson's biplane method at 45 %.   There is paradoxical or dyssynergic septal motion consistent with RV pacemaker.   LV regional wall motion is abnormal - the apex is hypokinetic and the distal inferior wall is hypokinetic.   Stage I diastolic dysfunction. LV filling pressures are estimated to be normal.  Right Ventricle:   The right ventricle is normal in size.   Right ventricular systolic function is normal.  Right Atrium:   There is a mobile echogenicity noted in the RA. This could represent fibrinous material attached to the Pacing leads  vs  thrombus vs Prominent Chiari network. Appears to be present on prior study, although better visualized on this study.  Aortic Valve:   There is a bioprosthetic valve in the aortic position consistent with prior TAVI.   With Vmax 2.89ms, mean PG 187mg, DI0.41.   There is mild - moderate peri-valvular regurgitation - PHT 31074m Mitral Valve:   Mild , eccentric and posteriorly directed  mitral regurgitation is present.  Tricuspid Valve:   Mild tricuspid regurgitation present.   The right ventricular systolic pressure is estimated to be 24 mmHg.  Overall Conclusions:   Compared to prior study, EF is mildly reduced with new wall motion abnormalities as noted above. Peri-prosthetic  regurgitation appears similar to prior study.    EKG: Ventricular paced rhythm    In clinic check for NP Conly   Abbott DC ICD   DDDR 60     Presenting: AP/VP   Underlying SB w/ CHB     AP: 45%   VP: 100%     No arrhythmias recorded     Normal device and lead function with stable trends. Connected to remote monitoring.   Assessment/Plan: 71 y.o. male with a cardiac history significant for HTN, non-rheumatic aortic stenosis s/p TAVR (08/2019), chronic diastolic heart failure with borderline reduced systolic function on MRI and VT s/p ICD placement  with recent ablation presenting for follow-up.    1.  Aortic stenosis status post TAVR: He underwent successful TAVR in October 2020 but subsequent echocardiograms have shown mild to likely moderate paravalvular insufficiency by TEE with a mean gradient of around 32 mmHg.  Fortunately he is feeling reasonably well and denies any frank heart failure symptoms with stable hemoglobins and no evidence of anemia.  He will continue to follow with the valve clinic but for now plan conservative management.     2.  Ventricular tachycardia status post ICD: He initially had a dual-chamber pacemaker placed due to syncopal episodes.  He subsequently was found to have multiple episodes of rapid VT on device  interrogation with defibrillations and underwent ablation back in August.  No significant arrhythmias identified on recent device checks.  More recently he has been found to have chronic RV pacing with mild reduction in his EF to 45%.  He is scheduled to upgrade to a biventricular device next month.  Hopefully this will improve his EF.  With that said we have been limited in medical therapy due to his history of orthostatic hypotension or at least what was presumed to be orthostasis.  He has had no further syncopal episodes since his device was placed.  I have asked him to monitor his blood pressure at home over the next few weeks as may need to optimize his medications    3.  Orthostatic hypotension: He has had a longstanding history of orthostasis of really unclear etiology.  His syncopal episodes in the past have also not been clear as to etiology but certainly only with the findings recently suggest a possible arrhythmia genic etiology.  He has been on fludrocortisone and midodrine for the past several months and I do think it is reasonable to try and wean him off of these medications.  He will wean down on his midodrine and continue fludrocortisone for now.  Of asked him to report back on his blood pressures in the next few weeks as ideally we add either an ACE or an ARB on board given his mildly reduced EF in conjunction with his metoprolol.    Return to clinic in 6 months    Patient had no questions regarding treatment plan, goals, risks or benefits and agrees to contact me or my clinic in the interim should any questions or problems arise.     Erma Pinto, MD  Assistant Professor of [employer] [facility] of Cardiology  Pager [phone]      Electronically signed by Erma Pinto,  MD at 11/01/2022 11:06 AM EST

## 2022-11-13 DIAGNOSIS — N399 Disorder of urinary system, unspecified: Secondary | ICD-10-CM | POA: Diagnosis not present

## 2022-11-13 DIAGNOSIS — F0150 Vascular dementia without behavioral disturbance: Secondary | ICD-10-CM | POA: Diagnosis not present

## 2022-11-13 DIAGNOSIS — N1831 Chronic kidney disease, stage 3a: Secondary | ICD-10-CM | POA: Diagnosis not present

## 2022-11-13 DIAGNOSIS — J449 Chronic obstructive pulmonary disease, unspecified: Secondary | ICD-10-CM | POA: Diagnosis not present

## 2022-11-16 DIAGNOSIS — J449 Chronic obstructive pulmonary disease, unspecified: Secondary | ICD-10-CM | POA: Diagnosis not present

## 2022-11-16 DIAGNOSIS — F324 Major depressive disorder, single episode, in partial remission: Secondary | ICD-10-CM | POA: Diagnosis not present

## 2022-11-16 DIAGNOSIS — K219 Gastro-esophageal reflux disease without esophagitis: Secondary | ICD-10-CM | POA: Diagnosis not present

## 2022-11-16 DIAGNOSIS — N1831 Chronic kidney disease, stage 3a: Secondary | ICD-10-CM | POA: Diagnosis not present

## 2022-11-16 DIAGNOSIS — I10 Essential (primary) hypertension: Secondary | ICD-10-CM | POA: Diagnosis not present

## 2022-11-16 DIAGNOSIS — N400 Enlarged prostate without lower urinary tract symptoms: Secondary | ICD-10-CM | POA: Diagnosis not present

## 2022-11-24 NOTE — Telephone Encounter (Signed)
Formatting of this note might be different from the original.  TCM call regarding recent hospitalization. Cosmo Tetreault was admitted to [facility] hospital.  Reason for admission: Ventricular Tachycardia  Advanced heart block  High burden RV pacing with reduced LVEF   Admission Date: 11/22/22  Discharge Date: 11/23/22     Called and LVM for patient to call back, x1 attempt  Electronically signed by Ihor Austin, LPN at 05/39/7673  4:19 AM EST

## 2022-11-27 NOTE — Telephone Encounter (Signed)
Formatting of this note might be different from the original.  .[email or web address] call regarding recent hospitalization. Ruben [name] was admitted to [facility] hospital.  Reason for admission: Ventricular Tachycardia  Advanced heart block  High burden RV pacing with reduced LVEF   Admission Date: 11/22/22  Discharge Date: 11/23/22     Called and LVM for patient to call back, x2 attempt    Electronically signed by Ihor Austin, LPN at 23/76/2831 51:76 AM EST

## 2022-11-28 NOTE — Telephone Encounter (Signed)
Formatting of this note might be different from the original.  Left VM stating I was calling to check on Mr. Njie regarding his ICD procedure he recently had done at [facility]. Over VM I reviewed his physical restrictions which he should be adhering to. I stated importance of him attending his post op appointment that is scheduled for 11/30/22 and stated that we will interrogate his device as well as assess his incision at this appointment.      I reiterated that he can reach the cardiac device team at [phone] if they have any further questions or concerns about their cardiac device. I stated no need to call back unless he had any questions or concerns.     Abby Potash, RN, BSN  Cardiac Device Specialist   [phone]    Electronically signed by Abby Potash, RN at 11/28/2022  9:58 AM EST

## 2023-01-19 ENCOUNTER — Other Ambulatory Visit: Payer: Medicare Other

## 2023-01-19 ENCOUNTER — Encounter: Payer: Self-pay | Admitting: Nurse Practitioner

## 2023-01-19 ENCOUNTER — Ambulatory Visit (INDEPENDENT_AMBULATORY_CARE_PROVIDER_SITE_OTHER): Payer: Medicare Other | Admitting: Nurse Practitioner

## 2023-01-19 ENCOUNTER — Ambulatory Visit (INDEPENDENT_AMBULATORY_CARE_PROVIDER_SITE_OTHER): Payer: Medicare Other

## 2023-01-19 VITALS — BP 130/80 | HR 99 | Temp 98.7°F | Ht 69.0 in | Wt 175.2 lb

## 2023-01-19 DIAGNOSIS — R0602 Shortness of breath: Secondary | ICD-10-CM

## 2023-01-19 DIAGNOSIS — J4489 Other specified chronic obstructive pulmonary disease: Secondary | ICD-10-CM

## 2023-01-19 DIAGNOSIS — J441 Chronic obstructive pulmonary disease with (acute) exacerbation: Secondary | ICD-10-CM

## 2023-01-19 DIAGNOSIS — R5381 Other malaise: Secondary | ICD-10-CM

## 2023-01-19 DIAGNOSIS — R296 Repeated falls: Secondary | ICD-10-CM | POA: Diagnosis not present

## 2023-01-19 DIAGNOSIS — Z87898 Personal history of other specified conditions: Secondary | ICD-10-CM

## 2023-01-19 DIAGNOSIS — J45901 Unspecified asthma with (acute) exacerbation: Secondary | ICD-10-CM

## 2023-01-19 DIAGNOSIS — C3412 Malignant neoplasm of upper lobe, left bronchus or lung: Secondary | ICD-10-CM

## 2023-01-19 DIAGNOSIS — J439 Emphysema, unspecified: Secondary | ICD-10-CM

## 2023-01-19 DIAGNOSIS — J449 Chronic obstructive pulmonary disease, unspecified: Secondary | ICD-10-CM

## 2023-01-19 LAB — CBC WITH DIFFERENTIAL/PLATELET
Basophils Absolute: 0 10*3/uL (ref 0.0–0.1)
Basophils Relative: 0.3 % (ref 0.0–3.0)
Eosinophils Absolute: 0.2 10*3/uL (ref 0.0–0.7)
Eosinophils Relative: 3.5 % (ref 0.0–5.0)
HCT: 37.9 % — ABNORMAL LOW (ref 39.0–52.0)
Hemoglobin: 12.6 g/dL — ABNORMAL LOW (ref 13.0–17.0)
Lymphocytes Relative: 53.4 % — ABNORMAL HIGH (ref 12.0–46.0)
Lymphs Abs: 3 10*3/uL (ref 0.7–4.0)
MCHC: 33.3 g/dL (ref 30.0–36.0)
MCV: 95.6 fl (ref 78.0–100.0)
Monocytes Absolute: 0.4 10*3/uL (ref 0.1–1.0)
Monocytes Relative: 7.7 % (ref 3.0–12.0)
Neutro Abs: 2 10*3/uL (ref 1.4–7.7)
Neutrophils Relative %: 35.1 % — ABNORMAL LOW (ref 43.0–77.0)
Platelets: 318 10*3/uL (ref 150.0–400.0)
RBC: 3.96 Mil/uL — ABNORMAL LOW (ref 4.22–5.81)
RDW: 13.8 % (ref 11.5–15.5)
WBC: 5.7 10*3/uL (ref 4.0–10.5)

## 2023-01-19 LAB — COMPREHENSIVE METABOLIC PANEL
ALT: 7 U/L (ref 0–53)
AST: 11 U/L (ref 0–37)
Albumin: 4.2 g/dL (ref 3.5–5.2)
Alkaline Phosphatase: 66 U/L (ref 39–117)
BUN: 13 mg/dL (ref 6–23)
CO2: 30 mEq/L (ref 19–32)
Calcium: 9.4 mg/dL (ref 8.4–10.5)
Chloride: 100 mEq/L (ref 96–112)
Creatinine, Ser: 1.01 mg/dL (ref 0.40–1.50)
GFR: 74.98 mL/min (ref >60.00)
Glucose, Bld: 93 mg/dL (ref 70–99)
Potassium: 3.9 mEq/L (ref 3.5–5.1)
Sodium: 138 mEq/L (ref 135–145)
Total Bilirubin: 0.5 mg/dL (ref 0.2–1.2)
Total Protein: 7 g/dL (ref 6.0–8.3)

## 2023-01-19 LAB — MAGNESIUM: Magnesium: 2 mg/dL (ref 1.5–2.5)

## 2023-01-19 LAB — BRAIN NATRIURETIC PEPTIDE: Pro B Natriuretic peptide (BNP): 9 pg/mL (ref 0.0–100.0)

## 2023-01-19 MED ORDER — METHYLPREDNISOLONE ACETATE 80 MG/ML IJ SUSP
80.0000 mg | Freq: Once | INTRAMUSCULAR | Status: AC
  Administered 2023-01-19: 80 mg via INTRAMUSCULAR

## 2023-01-19 MED ORDER — DOXYCYCLINE HYCLATE 100 MG PO TABS: 100.0000 mg | ORAL_TABLET | Freq: Two times a day (BID) | ORAL | 0 refills | Status: AC

## 2023-01-19 MED ORDER — PREDNISONE 10 MG PO TABS: ORAL_TABLET | ORAL | 0 refills | Status: DC

## 2023-01-19 NOTE — Progress Notes (Signed)
No evidence of pna on imaging today. Continue plan as discussed. Thanks.

## 2023-01-19 NOTE — Progress Notes (Signed)
@Patient  ID: Bruce Sullivan, male    DOB: 04-19-1951, 72 y.o.   MRN: [id number]  Chief Complaint  Patient presents with   Follow-up    Cough since last visit, mucous comes up, but not sure what color.  Wheezing during the day and at night, wakes him up.  Increase sob since last visit.  Using rescue inhaler more.  Falling a lot.    Referring provider: Wenda Low, MD  HPI: 72 year old male, former smoker followed for COPD with emphysema and adenocarcinoma of the left lung status post SBRT.  He is a patient of Dr. Juline Patch and last seen in office 10/20/2022 by [facility] NP.  Past medical history significant for CKD, hypertension, HLD, TIA, stroke, GERD, seizure disorder, BPH, GAD, meningioma.   TEST/EVENTS:  07/13/2021 PFTs: FVC 56, FEV1 31, ratio 50, TLC 109, DLCO corrected 58.  Very severe obstructive lung disease with significant bronchodilator response (47% change) 02/10/2022 CT chest w contrast: atherosclerosis. Loop recorder in place. Right infrahilar lymph node 0.9, not hypermetabolic on previous PET and relatively unchanged in size. The medial LUL nodule measures 1x0.6 cm formerly 1.7x0.9 cm. Adjacent interstitial accentuation and hazy ground-glass opacity compatible with SBRT. Stable 5x3 mm nodule in the LUL.  05/17/2022 CXR 2 view: Patchy airspace disease in the lower lungs, suggestive of pneumonia. 08/15/2022 CT chest w con: atherosclerosis. Centrilobular emphysema noted. Interval increase in subsegmental atelectasis in the RML. Interstitial coarsening in the LUL, stable and sequelae of radiation therapy. Subpleural nodule measured 7x4 mm, previously 10x6  10/04/2022 CXR: no acute process  01/20/2022: OV with Dr. Valeta Harms.  Follow-up for COPD.  He was previously diagnosed with adenocarcinoma of the left lung status post SBRT.  During work-up, he was found to have a brain mass concerning for meningioma.  He was seen by neurosurgery who need his loop recorder removed so they can obtain MRI of the  brain.  He is awaiting to see cardiology at the [facility] for this.  From a respiratory standpoint he is able to do most of his activities of daily living.  He does walk slower and with a cane.  He does have some shortness of breath.  His wife is concerned but he feels like he has been stable.  He did well with the samples of Breztri and would like to stay on it if possible.  Advised to follow-up in 6 months.  05/17/2022: OV with Tarron Krolak NP for increased shortness of breath over the last month.  She helps to provide his history. He has been coughing more recently and producing mucus, but she is not entirely sure what color it is. He also has some chest congestion/tightness and associated wheezing when he gets short of breath. He is feeling more tired and weaker than normal. Eating and drinking well though. They deny any recent fevers, chills, night sweats, hemoptysis, weight loss, chest pain, leg swelling. He has not been using a maintenance inhaler regularly and they are a little unclear on which inhalers he has at home. After investigation, discovered he only has [place] at home. He previously was on [place] and doing better, but he ran out of refills so his [place] doctor switched him. He uses his rescue albuterol inhaler or neb multiple times a day. CXR revealed patchy infiltrates, consistent with pneumonia - treated with augmentin course and prednisone burst for AECOPD. Stopped Wixela and stepped up to [employer]. Close follow up.  06/01/2022: OV with Terrence Wishon NP for follow up after being treated for pneumonia  and AECOPD. She again helps to provide history. His cough has improved and is no longer productive. He is also feeling better and energy is better. He is eating and drinking as normal again. Unfortunately, his breathing is minimally improved. He felt better on the prednisone but since completing, feels like his breathing is worse. He gets winded even just walking to the bathroom. Uses his albuterol nebulizer every hour while  awake, per his wife. He also uses his albuterol rescue inhaler throughout the day. He is using Breztri Twice daily. He is still using mucinex Twice daily. He was supposed to use flutter valve 2-3 times a day but his wife reports he hasn't been consistent with this. Treated for unresolved AECOPD with prednisone taper. Plan to repeat CXR in 4 weeks  06/16/2022: OV with Blessen Kimbrough NP for follow up after being treated for persistent AECOPD after having pneumonia. He reports that he is feeling much better.  Feels like his breathing has improved after the last prednisone course.  He can walk to the bathroom and back again without feeling completely out of breath.  Cough has mostly resolved; occasionally will have some clear sputum.  No significant wheezing, chest congestion.  No recent fevers, chills.  Denies any hemoptysis, leg swelling, orthopnea, anorexia, weight loss.  He continues on [place] twice a day.  He does feel like this has helped.  He is using his albuterol a little less often.  However, his wife still reports that he uses his nebulizer frequently throughout the day.  She feels like it is a habit for him. Feels like it gives him more stamina.    10/04/2022: OV with Shariq Puig NP for acute visit with his wife.  She tells me that over the last 3 to 4 weeks, he has had an increased productive cough with gray sputum production.  Feels like his sputum is also a lot thicker than it normally is.  He has been wheezing a lot more, especially over the last few days.  Feels like he is more short winded.  Using his albuterol very frequently again.  Denies any fevers, chills, hemoptysis, lower extremity swelling, orthopnea.  Using his Judithann Sauger twice daily still. CXR without acute process. Treated for AECOPD with empiric doxycycline and prednisone.   10/20/2022: OV with Treniyah Lynn NP for two week follow up after being treated for AECOPD. His wife is with him. He tells me he is feeling better. Still has an occasional cough but seems  back to his baseline. Sputum has cleared up. He does occasionally feel like it gets stuck. Breathing is better too. He's using his nebulizer less often. Haven't noticed as much wheezing; still occasionally has some. Denies fevers, chills, hemoptysis. He is using his breztri twice daily. No concerns or complaints today.   01/19/2023: Today - follow up Patient presents today for follow up with his wife. He is also having some acute symptoms. Over the past two weeks, she has noticed he's been coughing and wheezing more. He is producing sputum but they are not sure what color it is. He's also more weak and has fallen a few times, which seems to be happening more often. They did see his neurosurgeon and she tells me that he's had some mini strokes and has a "cyst or something" in his head. Note unavailable in Epic. They thought these things may be contributing to his falls. His breathing is stable but he's still using his albuterol nebs frequently, which is something we have discussed in the  past. He's lost about 10 pounds since he was here 3 months ago. He has been eating less over the past few months. She tries to keep him well hydrated. He denies any fevers, chills, hemoptysis, leg swelling, dizziness, palpitations, syncope. They do not monitor his oxygen levels at home. He uses Breztri twice a day; needs samples while they wait on his refills from the [place]. Walks with a cane at baseline  Allergies  Allergen Reactions   Bee Venom Anaphylaxis    Face gets red and hard to breathe     Immunization History  Administered Date(s) Administered   Fluad Quad(high Dose 65+) 10/14/2020, 09/12/2021   Influenza Split 08/25/2014, 08/07/2019   Influenza, High Dose Seasonal PF 08/02/2018, 09/12/2021   Influenza, Seasonal, Injecte, Preservative Fre 08/25/2014   Influenza-Unspecified 08/25/2014, 08/28/2014, 10/04/2015, 08/07/2019, 08/31/2022   Moderna Sars-Covid-2 Vaccination 01/24/2020, 02/21/2020   Pneumococcal  Conjugate-13 06/05/2017, 12/13/2017   Pneumococcal Polysaccharide-23 11/06/2012, 04/10/2015, 04/19/2015   Tdap 07/24/2014   Zoster Recombinat (Shingrix) 05/03/2021, 03/02/2022   Zoster, Live 03/02/2022    Past Medical History:  Diagnosis Date   Anxiety    Arthritis    Asthma    Atherosclerosis 01/16/2015   Noted on CXR   Bilateral lower extremity edema 01/16/2015   Chronic kidney disease (CKD), stage II (mild)    COPD (chronic obstructive pulmonary disease) (HCC)    Dementia (HCC)    Depression    Dyspnea    GERD (gastroesophageal reflux disease)    Gout    Hepatitis 12/2019   Hx Hep C positive in blood. Treated.   History of colon polyps    Hoarseness    Hyperlipidemia    Hypertension    Lung cancer ([place]) 09/07/2021   Neck abscess 09/03/2016   Pre-diabetes    Pulmonary emphysema ([place])    Seizure ([place])    Stroke ([place]) 2014   Thyroid disease    TIA (transient ischemic attack)    Urinary frequency    Urine retention    UTI (urinary tract infection)     Tobacco History: Social History   Tobacco Use  Smoking Status Former   Types: E-cigarettes   Quit date: 01/2014   Years since quitting: 9.0  Smokeless Tobacco Never   Counseling given: Not Answered   Outpatient Medications Prior to Visit  Medication Sig Dispense Refill   acetaminophen (TYLENOL) 500 MG tablet Take 500 mg by mouth 2 (two) times daily as needed for pain.     albuterol (PROVENTIL) (2.5 MG/3ML) 0.083% nebulizer solution Take 3 mLs (2.5 mg total) by nebulization every 6 (six) hours as needed for wheezing or shortness of breath. 75 mL 12   allopurinol (ZYLOPRIM) 100 MG tablet Take 100 mg by mouth 2 (two) times daily.     amLODipine (NORVASC) 10 MG tablet Take 10 mg by mouth daily.     atorvastatin (LIPITOR) 10 MG tablet Take 10 mg by mouth at bedtime.     Budeson-Glycopyrrol-Formoterol (BREZTRI AEROSPHERE) 160-9-4.8 MCG/ACT AERO Inhale 2 puffs into the lungs in the morning and at bedtime. 10.7 g 12    chlorthalidone (HYGROTON) 50 MG tablet Take 50 mg by mouth daily.     Cholecalciferol 50 MCG (2000 UT) TABS Take 2,000 Units by mouth daily.     FLUoxetine (PROZAC) 20 MG capsule Take 20 mg by mouth daily.     guaiFENesin (MUCINEX) 600 MG 12 hr tablet Take 1 tablet (600 mg total) by mouth 2 (two) times daily. 60 tablet 1  levETIRAcetam (KEPPRA) 500 MG tablet Take 500 mg by mouth 2 (two) times daily.     omeprazole (PRILOSEC) 40 MG capsule Take 40 mg by mouth daily.     ondansetron (ZOFRAN-ODT) 4 MG disintegrating tablet Take 1 tablet (4 mg total) by mouth every 8 (eight) hours as needed for nausea. 10 tablet 0   colchicine 0.6 MG tablet Take 1 tablet (0.6 mg total) by mouth daily for 5 days. 5 tablet 0   predniSONE (DELTASONE) 10 MG tablet 4 tabs for 2 days, then 3 tabs for 2 days, 2 tabs for 2 days, then 1 tab for 2 days, then stop (Patient not taking: Reported on 01/19/2023) 20 tablet 0   No facility-administered medications prior to visit.     Review of Systems:   Constitutional: No night sweats, fevers, chills. +fatigue, lassitude, weight loss HEENT: No headaches, difficulty swallowing, tooth/dental problems, or sore throat. No sneezing, itching, ear ache, nasal congestion, or post nasal drip CV:  No chest pain, orthopnea, PND, swelling in lower extremities, anasarca, dizziness, palpitations, syncope Resp: +shortness of breath with exertion; productive cough; wheezing. No hemoptysis.  No chest wall deformity GI:  +decreased appetite. No heartburn, indigestion, abdominal pain, nausea, vomiting, diarrhea, change in bowel habits, bloody stools.  Skin: No rash, lesions, ulcerations MSK:  No joint pain or swelling.   Neuro: +weakness. No memory impairment, numbness, tingling Psych: No depression or anxiety. Mood stable.     Physical Exam:  BP 130/80 (BP Location: Right Arm, Patient Position: Sitting, Cuff Size: Normal)   Pulse 99   Temp 98.7 F (37.1 C) (Oral)   Ht 5\' 9"  (1.753 m)    Wt 175 lb 3.2 oz (79.5 kg)   SpO2 95%   BMI 25.87 kg/m   GEN: Pleasant, interactive, chronically ill appearing; in no acute distress. HEENT:  Normocephalic and atraumatic. PERRLA. Sclera white. Nasal turbinates pink, moist and patent bilaterally. No rhinorrhea present. Oropharynx pink and moist, without exudate or edema. No lesions, ulcerations, or postnasal drip.  NECK:  Supple w/ fair ROM. No JVD present. Normal carotid impulses w/o bruits. Thyroid symmetrical with no goiter or nodules palpated. No lymphadenopathy.   CV: RRR, no m/r/g, no peripheral edema. Pulses intact, +2 bilaterally. No cyanosis, pallor or clubbing. PULMONARY:  Unlabored, regular breathing. Scattered wheezes bilaterally A&P. Congested cough. No accessory muscle use. No dullness to percussion. GI: BS present and normoactive. Soft, non-tender to palpation. No organomegaly or masses detected.  MSK: No erythema, warmth or tenderness. Cap refil <2 sec all extrem. No deformities or joint swelling noted. Muscle wasting Neuro: A/Ox3. No focal deficits noted.   Skin: Warm, no lesions or rashe Psych: Normal affect and behavior. Judgement and thought content appropriate.     Lab Results:  CBC    Component Value Date/Time   WBC 6.9 03/10/2022 0344   RBC 3.96 (L) 03/10/2022 0344   HGB 12.4 (L) 03/10/2022 0344   HCT 36.2 (L) 03/10/2022 0344   PLT 253 03/10/2022 0344   MCV 91.4 03/10/2022 0344   MCH 31.3 03/10/2022 0344   MCHC 34.3 03/10/2022 0344   RDW 12.0 03/10/2022 0344   LYMPHSABS 2.4 12/23/2021 1028   MONOABS 0.5 12/23/2021 1028   EOSABS 0.1 12/23/2021 1028   BASOSABS 0.0 12/23/2021 1028    BMET    Component Value Date/Time   NA 137 03/10/2022 0344   K 3.6 03/10/2022 0344   CL 104 03/10/2022 0344   CO2 24 03/10/2022 0344   GLUCOSE 124 (  H) 03/10/2022 0344   BUN 19 03/10/2022 0344   CREATININE 1.30 (H) 08/15/2022 1353   CALCIUM 9.1 03/10/2022 0344   GFRNONAA >60 03/10/2022 0344   GFRAA >60 09/16/2018  1255    BNP    Component Value Date/Time   BNP 24.4 12/27/2020 0825     Imaging:  DG Chest 2 View  Result Date: 01/19/2023 CLINICAL DATA:  AECOPD; lassitude/falls/dyspnea/productive cough EXAM: CHEST - 2 VIEW COMPARISON:  Radiographs 10/04/2022.  CT 08/15/2022. FINDINGS: The heart size and mediastinal contours are stable. Stable mild pulmonary hyperinflation with central airway thickening. A small fiducial marker medially in the left upper lobe appears unchanged. No evidence of airspace disease or suspicious pulmonary nodule. Mild degenerative changes in the thoracic spine. IMPRESSION: Stable findings of COPD. No evidence of acute cardiopulmonary process. Electronically Signed   By: Richardean Sale M.D.   On: 01/19/2023 11:42    methylPREDNISolone acetate (DEPO-MEDROL) injection 80 mg     Date Action Dose Route User   01/19/2023 1218 Given 80 mg Intramuscular (Right Ventrogluteal) Nolon Stalls, Kimber Relic, RN           Latest Ref Rng & Units 07/13/2021    2:40 PM  PFT Results  FVC-Pre L 2.09   FVC-Predicted Pre % 56   FVC-Post L 2.62   FVC-Predicted Post % 70   Pre FEV1/FVC % % 43   Post FEV1/FCV % % 50   FEV1-Pre L 0.89   FEV1-Predicted Pre % 31   FEV1-Post L 1.31   DLCO uncorrected ml/min/mmHg 13.99   DLCO UNC% % 55   DLCO corrected ml/min/mmHg 14.75   DLCO COR %Predicted % 58   DLVA Predicted % 79   TLC L 7.44   TLC % Predicted % 109   RV % Predicted % 224     No results found for: "NITRICOXIDE"      Assessment & Plan:   COPD with chronic bronchitis and emphysema (HCC) Severe obstructive lung disease with acute exacerbation. Given his infectious symptoms and generalized weakness, CXR obtained without evidence of superimposed infection. We will treat him with depo inj 80 mg x 1, prednisone taper and empiric doxycycline. Avoid macrolide therapy with colchicine use. Maximize mucociliary clearance therapies. I am going to start him on Daliresp to hopefully reduce  future exacerbations - check liver function today. Medication side effect profile reviewed. Action plan in place. VS stable on room air and walk test without desaturations.  He has had multiple flares over the past year, most recently 09/2022, progressive decline in functional capacity and weight loss. CT from October 2023 was stable without evidence of recurrence of malignancy. He has repeat planned for April 2024. With his declining status, I think it would be appropriate to refer him to palliative care to discuss goals of care. They were agreeable to this. Referral placed today.   Patient Instructions  Continue Breztri 2 puffs Twice daily. Brush tongue and rinse mouth afterwards  Continue Albuterol inhaler 2 puffs or 3 mL neb every 6 hours as needed for shortness of breath or wheezing. Notify if symptoms persist despite rescue inhaler/neb use. Do not use your albuterol more than prescribed! Continue omeprazole 40 mg daily  Continue Mucinex 600 mg one to two times daily for chest congestion Continue Flutter valve 2-3 times a day  Doxycycline 1 tab Twice daily for 7 days. Take with food and full glass of water. Wear sunscreen when outside  Prednisone taper. 4 tabs for 2 days, then  3 tabs for 2 days, 2 tabs for 2 days, then 1 tab for 2 days, then stop. Take in AM with food. Start tomorrow  We will start you on a medication called daliresp to prevent future flare ups as long as your liver function is okay. I will let you know about your labs once results are received. Daliresp 250 mcg (1/2 tab) daily for 4 weeks then increase to 500 mcg daily  Referral to palliative care  Follow up with neurology as scheduled   Monitor oxygen levels at home for goal >88-90%  Follow up in 2 weeks with Dr. Valeta Harms or Joellen Jersey Georgetta Crafton,NP. If symptoms do not improve or worsen, please contact office for sooner follow up or seek emergency care.    Malignant neoplasm of bronchus of left upper lobe (HCC) S/p SBRT. CT chest  from 10/10 showed interval decrease in size of LUL nodule and radiation changes. Follow up with radiation oncology and repeat CT chest in April 2024.   Declining functional status Encouraged him to follow up with neurology as scheduled. We also discussed involving his PCP to see if they can get some home PT. See above plan.    I spent 45 minutes of dedicated to the care of this patient on the date of this encounter to include pre-visit review of records, face-to-face time with the patient discussing conditions above, post visit ordering of testing, clinical documentation with the electronic health record, making appropriate referrals as documented, and communicating necessary findings to members of the patients care team.  Clayton Bibles, NP 01/19/2023  Pt aware and understands NP's role.

## 2023-01-19 NOTE — Assessment & Plan Note (Signed)
S/p SBRT. CT chest from 10/10 showed interval decrease in size of LUL nodule and radiation changes. Follow up with radiation oncology and repeat CT chest in April 2024.

## 2023-01-19 NOTE — Assessment & Plan Note (Addendum)
Severe obstructive lung disease with acute exacerbation. Given his infectious symptoms and generalized weakness, CXR obtained without evidence of superimposed infection. We will treat him with depo inj 80 mg x 1, prednisone taper and empiric doxycycline. Avoid macrolide therapy with colchicine use. Maximize mucociliary clearance therapies. I am going to start him on Daliresp to hopefully reduce future exacerbations - check liver function today. Medication side effect profile reviewed. Action plan in place. VS stable on room air and walk test without desaturations.  He has had multiple flares over the past year, most recently 09/2022, progressive decline in functional capacity and weight loss. CT from October 2023 was stable without evidence of recurrence of malignancy. He has repeat planned for April 2024. With his declining status, I think it would be appropriate to refer him to palliative care to discuss goals of care. They were agreeable to this. Referral placed today.   Patient Instructions  Continue Breztri 2 puffs Twice daily. Brush tongue and rinse mouth afterwards  Continue Albuterol inhaler 2 puffs or 3 mL neb every 6 hours as needed for shortness of breath or wheezing. Notify if symptoms persist despite rescue inhaler/neb use. Do not use your albuterol more than prescribed! Continue omeprazole 40 mg daily  Continue Mucinex 600 mg one to two times daily for chest congestion Continue Flutter valve 2-3 times a day  Doxycycline 1 tab Twice daily for 7 days. Take with food and full glass of water. Wear sunscreen when outside  Prednisone taper. 4 tabs for 2 days, then 3 tabs for 2 days, 2 tabs for 2 days, then 1 tab for 2 days, then stop. Take in AM with food. Start tomorrow  We will start you on a medication called daliresp to prevent future flare ups as long as your liver function is okay. I will let you know about your labs once results are received. Daliresp 250 mcg (1/2 tab) daily for 4 weeks  then increase to 500 mcg daily  Referral to palliative care  Follow up with neurology as scheduled   Monitor oxygen levels at home for goal >88-90%  Follow up in 2 weeks with Dr. Valeta Harms or Joellen Jersey Roylene Heaton,NP. If symptoms do not improve or worsen, please contact office for sooner follow up or seek emergency care.

## 2023-01-19 NOTE — Patient Instructions (Addendum)
Continue Breztri 2 puffs Twice daily. Brush tongue and rinse mouth afterwards  Continue Albuterol inhaler 2 puffs or 3 mL neb every 6 hours as needed for shortness of breath or wheezing. Notify if symptoms persist despite rescue inhaler/neb use. Do not use your albuterol more than prescribed! Continue omeprazole 40 mg daily  Continue Mucinex 600 mg one to two times daily for chest congestion Continue Flutter valve 2-3 times a day  Doxycycline 1 tab Twice daily for 7 days. Take with food and full glass of water. Wear sunscreen when outside  Prednisone taper. 4 tabs for 2 days, then 3 tabs for 2 days, 2 tabs for 2 days, then 1 tab for 2 days, then stop. Take in AM with food. Start tomorrow  We will start you on a medication called daliresp to prevent future flare ups as long as your liver function is okay. I will let you know about your labs once results are received. Daliresp 250 mcg (1/2 tab) daily for 4 weeks then increase to 500 mcg daily  Referral to palliative care  Follow up with neurology as scheduled   Monitor oxygen levels at home for goal >88-90%  Follow up in 2 weeks with Dr. Valeta Harms or Bruce Jersey Isadore Palecek,NP. If symptoms do not improve or worsen, please contact office for sooner follow up or seek emergency care.

## 2023-01-19 NOTE — Assessment & Plan Note (Signed)
Encouraged him to follow up with neurology as scheduled. We also discussed involving his PCP to see if they can get some home PT. See above plan.

## 2023-01-22 MED ORDER — BREZTRI AEROSPHERE 160-9-4.8 MCG/ACT IN AERO: 2.0000 | INHALATION_SPRAY | Freq: Two times a day (BID) | RESPIRATORY_TRACT | 0 refills | Status: DC

## 2023-01-22 NOTE — Addendum Note (Signed)
Addended by: Vanessa Barbara on: 01/22/2023 05:40 PM   Modules accepted: Orders

## 2023-01-29 NOTE — Progress Notes (Signed)
Labs unremarkable for the most part. His hgb remains slightly decreased but stable over the past year. Thanks.

## 2023-01-30 ENCOUNTER — Other Ambulatory Visit: Payer: Medicare Other

## 2023-01-30 DIAGNOSIS — Z515 Encounter for palliative care: Secondary | ICD-10-CM

## 2023-01-30 NOTE — Progress Notes (Unsigned)
PATIENT NAME: Bruce Sullivan DOB: Mar 01, 1951 MRN: [id number]  PRIMARY CARE PROVIDER: Wenda Low, MD  RESPONSIBLE PARTY:  Acct ID - Guarantor Home Phone Work Phone Relationship Acct Type  [id number] - [name], Bruce [phone]  Self P/F     [address], [place], [place] [address]   Palliative Care Initial Encounter Note   LPN completed home visit. Daughter [name] on the phone and wife [name] was present in the home    Respiratory: sometimes he gets SOB; has COPD; uses an inhaler and has a nebulizer at the bedside  Cognitive: wife reports pt has Dementia; booklet given;    Appetite: can feed himself on a good day; eats 2 meals and some snacks; won't drink water - he will only drink soda; suggested that wife try to mix some of a sugar-free juice packet mixed with water into his soda  GI/GU: incontinent of bowel and bladder; wears adult brief   Mobility: walks with a cane and sometimes a walker; no cane used from the bed to the recliner which is close to his bed; falls out of the bed (started happening recently)  ADLs: wife assists with bath, dressing and grooming; pt can sometimes follow directions with given cues but more often he is unable to follow directions   Sleeping Pattern: sleeps during the day; sleeps at night but has to get up to use the bathroom; pt usually goes back to sleep  Pain: pt denies pain at this time  Wt: 175lbs at this time  Dementia: diagnosed through the [place] as per wife  Diabetes: FSBS weekly; sometimes daily if his sugar level is increased  Palliative Care/ Hospice: LPN explained role and purpose of palliative care including visit frequency. Also discussed benefits of hospice care as well as the differences between the two with patient.    Goals of Care: Wife's goal is to try to get him off constant inhalers and nebulizer treatments. She wants him to stay in the home for as long as possible   CODE STATUS: Full Code ADVANCED DIRECTIVES: N MOST  FORM: Y PPS: 50%   PHYSICAL EXAM:   VITALS: Today's Vitals   01/30/23 1433  PainSc: 0-No pain     EXTREMITIES: normal SKIN: Skin color, texture, turgor normal. No rashes or lesions  NEURO: negative except for gait problems and memory problems   Pt in bed upon arrival to the home. He refused to have his vital signs taken   Next scheduled visit 02/28/23 @ 2:30pm     Geraldean Walen Georgann Housekeeper, LPN

## 2023-02-02 ENCOUNTER — Encounter: Payer: Self-pay | Admitting: Nurse Practitioner

## 2023-02-02 ENCOUNTER — Ambulatory Visit (INDEPENDENT_AMBULATORY_CARE_PROVIDER_SITE_OTHER): Payer: Medicare Other | Admitting: Nurse Practitioner

## 2023-02-02 VITALS — BP 130/74 | HR 76 | Temp 98.9°F | Ht 69.0 in | Wt 177.0 lb

## 2023-02-02 DIAGNOSIS — J449 Chronic obstructive pulmonary disease, unspecified: Secondary | ICD-10-CM | POA: Diagnosis not present

## 2023-02-02 DIAGNOSIS — C3412 Malignant neoplasm of upper lobe, left bronchus or lung: Secondary | ICD-10-CM | POA: Diagnosis not present

## 2023-02-02 DIAGNOSIS — R5381 Other malaise: Secondary | ICD-10-CM

## 2023-02-02 DIAGNOSIS — J439 Emphysema, unspecified: Secondary | ICD-10-CM

## 2023-02-02 DIAGNOSIS — J4489 Other specified chronic obstructive pulmonary disease: Secondary | ICD-10-CM | POA: Diagnosis not present

## 2023-02-02 MED ORDER — REVEFENACIN 175 MCG/3ML IN SOLN: 175.0000 ug | Freq: Every day | RESPIRATORY_TRACT | 3 refills | Status: DC

## 2023-02-02 MED ORDER — ROFLUMILAST 500 MCG PO TABS: ORAL_TABLET | ORAL | 11 refills | Status: DC

## 2023-02-02 MED ORDER — ARFORMOTEROL TARTRATE 15 MCG/2ML IN NEBU: 15.0000 ug | INHALATION_SOLUTION | Freq: Two times a day (BID) | RESPIRATORY_TRACT | 3 refills | Status: DC

## 2023-02-02 MED ORDER — BUDESONIDE 0.5 MG/2ML IN SUSP: 0.5000 mg | Freq: Two times a day (BID) | RESPIRATORY_TRACT | 3 refills | Status: DC

## 2023-02-02 NOTE — Progress Notes (Signed)
@Patient  ID: Bruce Sullivan, male    DOB: [birth year], 72 y.o.   MRN: [id number]  Chief Complaint  Patient presents with   Follow-up    Sob     Referring provider: Wenda Low, MD  HPI: 72 year old male, former smoker followed for COPD with emphysema and adenocarcinoma of the left lung status post SBRT.  He is a patient of Dr. Juline Patch and last seen in office 01/19/2023 by [facility] NP.  Past medical history significant for CKD, hypertension, HLD, TIA, stroke, GERD, seizure disorder, BPH, GAD, meningioma.   TEST/EVENTS:  07/13/2021 PFTs: FVC 56, FEV1 31, ratio 50, TLC 109, DLCO corrected 58.  Very severe obstructive lung disease with significant bronchodilator response (47% change) 02/10/2022 CT chest w contrast: atherosclerosis. Loop recorder in place. Right infrahilar lymph node 0.9, not hypermetabolic on previous PET and relatively unchanged in size. The medial LUL nodule measures 1x0.6 cm formerly 1.7x0.9 cm. Adjacent interstitial accentuation and hazy ground-glass opacity compatible with SBRT. Stable 5x3 mm nodule in the LUL.  05/17/2022 CXR 2 view: Patchy airspace disease in the lower lungs, suggestive of pneumonia. 08/15/2022 CT chest w con: atherosclerosis. Centrilobular emphysema noted. Interval increase in subsegmental atelectasis in the RML. Interstitial coarsening in the LUL, stable and sequelae of radiation therapy. Subpleural nodule measured 7x4 mm, previously 10x6  10/04/2022 CXR: no acute process  01/20/2022: OV with Dr. Valeta Harms.  Follow-up for COPD.  He was previously diagnosed with adenocarcinoma of the left lung status post SBRT.  During work-up, he was found to have a brain mass concerning for meningioma.  He was seen by neurosurgery who need his loop recorder removed so they can obtain MRI of the brain.  He is awaiting to see cardiology at the [facility] for this.  From a respiratory standpoint he is able to do most of his activities of daily living.  He does walk slower and with a cane.  He  does have some shortness of breath.  His wife is concerned but he feels like he has been stable.  He did well with the samples of Breztri and would like to stay on it if possible.  Advised to follow-up in 6 months.  05/17/2022: OV with Aashvi Rezabek NP for increased shortness of breath over the last month.  She helps to provide his history. He has been coughing more recently and producing mucus, but she is not entirely sure what color it is. He also has some chest congestion/tightness and associated wheezing when he gets short of breath. He is feeling more tired and weaker than normal. Eating and drinking well though. They deny any recent fevers, chills, night sweats, hemoptysis, weight loss, chest pain, leg swelling. He has not been using a maintenance inhaler regularly and they are a little unclear on which inhalers he has at home. After investigation, discovered he only has [place] at home. He previously was on [place] and doing better, but he ran out of refills so his [place] doctor switched him. He uses his rescue albuterol inhaler or neb multiple times a day. CXR revealed patchy infiltrates, consistent with pneumonia - treated with augmentin course and prednisone burst for AECOPD. Stopped Wixela and stepped up to [employer]. Close follow up.  06/01/2022: OV with Trenton Passow NP for follow up after being treated for pneumonia and AECOPD. She again helps to provide history. His cough has improved and is no longer productive. He is also feeling better and energy is better. He is eating and drinking as normal again. Unfortunately, his  breathing is minimally improved. He felt better on the prednisone but since completing, feels like his breathing is worse. He gets winded even just walking to the bathroom. Uses his albuterol nebulizer every hour while awake, per his wife. He also uses his albuterol rescue inhaler throughout the day. He is using Breztri Twice daily. He is still using mucinex Twice daily. He was supposed to use flutter valve  2-3 times a day but his wife reports he hasn't been consistent with this. Treated for unresolved AECOPD with prednisone taper. Plan to repeat CXR in 4 weeks  06/16/2022: OV with Lyberti Thrush NP for follow up after being treated for persistent AECOPD after having pneumonia. He reports that he is feeling much better.  Feels like his breathing has improved after the last prednisone course.  He can walk to the bathroom and back again without feeling completely out of breath.  Cough has mostly resolved; occasionally will have some clear sputum.  No significant wheezing, chest congestion.  No recent fevers, chills.  Denies any hemoptysis, leg swelling, orthopnea, anorexia, weight loss.  He continues on [place] twice a day.  He does feel like this has helped.  He is using his albuterol a little less often.  However, his wife still reports that he uses his nebulizer frequently throughout the day.  She feels like it is a habit for him. Feels like it gives him more stamina.    10/04/2022: OV with Toni Hoffmeister NP for acute visit with his wife.  She tells me that over the last 3 to 4 weeks, he has had an increased productive cough with gray sputum production.  Feels like his sputum is also a lot thicker than it normally is.  He has been wheezing a lot more, especially over the last few days.  Feels like he is more short winded.  Using his albuterol very frequently again.  Denies any fevers, chills, hemoptysis, lower extremity swelling, orthopnea.  Using his Judithann Sauger twice daily still. CXR without acute process. Treated for AECOPD with empiric doxycycline and prednisone.   10/20/2022: OV with Micheline Markes NP for two week follow up after being treated for AECOPD. His wife is with him. He tells me he is feeling better. Still has an occasional cough but seems back to his baseline. Sputum has cleared up. He does occasionally feel like it gets stuck. Breathing is better too. He's using his nebulizer less often. Haven't noticed as much wheezing; still  occasionally has some. Denies fevers, chills, hemoptysis. He is using his breztri twice daily. No concerns or complaints today.   01/19/2023: OV with Dillinger Aston NP for follow up with his wife. He is also having some acute symptoms. Over the past two weeks, she has noticed he's been coughing and wheezing more. He is producing sputum but they are not sure what color it is. He's also more weak and has fallen a few times, which seems to be happening more often. They did see his neurosurgeon and she tells me that he's had some mini strokes and has a "cyst or something" in his head. Note unavailable in Epic. They thought these things may be contributing to his falls. His breathing is stable but he's still using his albuterol nebs frequently, which is something we have discussed in the past. He's lost about 10 pounds since he was here 3 months ago. He has been eating less over the past few months. She tries to keep him well hydrated. He denies any fevers, chills, hemoptysis, leg swelling, dizziness,  palpitations, syncope. They do not monitor his oxygen levels at home. He uses Breztri twice a day; needs samples while they wait on his refills from the [place]. Walks with a cane at baseline  02/02/2023: Today - follow up Patient presents today for follow up with his wife. He is feeling better since his last visit. He still has shortness of breath with minimal exertion but feels closer to his baseline. Cough has mostly resolved. He's still overusing his Breztri. His wife isn't sure that he's actually getting the medicine in. He usually does 3 puffs at a time and uses it 2-3 times a day. He responds better to breathing treatments. He denies any fevers, chills, hemoptysis, wheezing. He has not fallen since he was here last. He did have palliative care come out, which his wife really appreciated it. She feels like it will be a great benefit for her and him.  Allergies  Allergen Reactions   Bee Venom Anaphylaxis    Face gets red and  hard to breathe     Immunization History  Administered Date(s) Administered   Fluad Quad(high Dose 65+) 10/14/2020, 09/12/2021   Influenza Split 08/25/2014, 08/07/2019   Influenza, High Dose Seasonal PF 08/02/2018, 09/12/2021   Influenza, Seasonal, Injecte, Preservative Fre 08/25/2014   Influenza-Unspecified 08/25/2014, 08/28/2014, 10/04/2015, 08/07/2019, 08/31/2022   Moderna Sars-Covid-2 Vaccination 01/24/2020, 02/21/2020   Pneumococcal Conjugate-13 06/05/2017, 12/13/2017   Pneumococcal Polysaccharide-23 11/06/2012, 04/10/2015, 04/19/2015   Tdap 07/24/2014   Zoster Recombinat (Shingrix) 05/03/2021, 03/02/2022   Zoster, Live 03/02/2022    Past Medical History:  Diagnosis Date   Anxiety    Arthritis    Asthma    Atherosclerosis 01/16/2015   Noted on CXR   Bilateral lower extremity edema 01/16/2015   Chronic kidney disease (CKD), stage II (mild)    COPD (chronic obstructive pulmonary disease) (HCC)    Dementia (HCC)    Depression    Dyspnea    GERD (gastroesophageal reflux disease)    Gout    Hepatitis 12/2019   Hx Hep C positive in blood. Treated.   History of colon polyps    Hoarseness    Hyperlipidemia    Hypertension    Lung cancer ([place]) 09/07/2021   Neck abscess 09/03/2016   Pre-diabetes    Pulmonary emphysema ([place])    Seizure ([place])    Stroke ([place]) 2014   Thyroid disease    TIA (transient ischemic attack)    Urinary frequency    Urine retention    UTI (urinary tract infection)     Tobacco History: Social History   Tobacco Use  Smoking Status Former   Types: E-cigarettes   Quit date: 01/2014   Years since quitting: 9.0  Smokeless Tobacco Never   Counseling given: Not Answered   Outpatient Medications Prior to Visit  Medication Sig Dispense Refill   acetaminophen (TYLENOL) 500 MG tablet Take 500 mg by mouth 2 (two) times daily as needed for pain.     albuterol (PROVENTIL) (2.5 MG/3ML) 0.083% nebulizer solution Take 3 mLs (2.5 mg total) by  nebulization every 6 (six) hours as needed for wheezing or shortness of breath. 75 mL 12   allopurinol (ZYLOPRIM) 100 MG tablet Take 100 mg by mouth 2 (two) times daily.     amLODipine (NORVASC) 10 MG tablet Take 10 mg by mouth daily.     atorvastatin (LIPITOR) 10 MG tablet Take 10 mg by mouth at bedtime.     chlorthalidone (HYGROTON) 50 MG tablet Take 50 mg by  mouth daily.     Cholecalciferol 50 MCG (2000 UT) TABS Take 2,000 Units by mouth daily.     FLUoxetine (PROZAC) 20 MG capsule Take 20 mg by mouth daily.     guaiFENesin (MUCINEX) 600 MG 12 hr tablet Take 1 tablet (600 mg total) by mouth 2 (two) times daily. 60 tablet 1   levETIRAcetam (KEPPRA) 500 MG tablet Take 500 mg by mouth 2 (two) times daily.     omeprazole (PRILOSEC) 40 MG capsule Take 40 mg by mouth daily.     ondansetron (ZOFRAN-ODT) 4 MG disintegrating tablet Take 1 tablet (4 mg total) by mouth every 8 (eight) hours as needed for nausea. 10 tablet 0   Budeson-Glycopyrrol-Formoterol (BREZTRI AEROSPHERE) 160-9-4.8 MCG/ACT AERO Inhale 2 puffs into the lungs in the morning and at bedtime. 10.7 g 12   Budeson-Glycopyrrol-Formoterol (BREZTRI AEROSPHERE) 160-9-4.8 MCG/ACT AERO Inhale 2 puffs into the lungs in the morning and at bedtime. 5.9 g 0   colchicine 0.6 MG tablet Take 1 tablet (0.6 mg total) by mouth daily for 5 days. 5 tablet 0   predniSONE (DELTASONE) 10 MG tablet 4 tabs for 2 days, then 3 tabs for 2 days, 2 tabs for 2 days, then 1 tab for 2 days, then stop (Patient not taking: Reported on 02/02/2023) 20 tablet 0   No facility-administered medications prior to visit.     Review of Systems:   Constitutional: No night sweats, fevers, chills. +fatigue, lassitude, weight loss (stable) HEENT: No headaches, difficulty swallowing, tooth/dental problems, or sore throat. No sneezing, itching, ear ache, nasal congestion, or post nasal drip CV:  No chest pain, orthopnea, PND, swelling in lower extremities, anasarca, dizziness,  palpitations, syncope Resp: +shortness of breath with exertion (baseline). No cough. No wheezing. No hemoptysis.  No chest wall deformity GI:  +decreased appetite. No heartburn, indigestion, abdominal pain, nausea, vomiting, diarrhea, change in bowel habits, bloody stools.  Skin: No rash, lesions, ulcerations MSK:  No joint pain or swelling.   Neuro: +weakness (improved). No memory impairment, numbness, tingling Psych: No depression or anxiety. Mood stable.     Physical Exam:  BP 130/74   Pulse 76   Temp 98.9 F (37.2 C)   Ht 5\' 9"  (1.753 m)   Wt 177 lb (80.3 kg)   SpO2 99%   BMI 26.14 kg/m   GEN: Pleasant, interactive, chronically ill appearing; in no acute distress. HEENT:  Normocephalic and atraumatic. PERRLA. Sclera white. Nasal turbinates pink, moist and patent bilaterally. No rhinorrhea present. Oropharynx pink and moist, without exudate or edema. No lesions, ulcerations, or postnasal drip.  NECK:  Supple w/ fair ROM. No JVD present. Normal carotid impulses w/o bruits. Thyroid symmetrical with no goiter or nodules palpated. No lymphadenopathy.   CV: RRR, no m/r/g, no peripheral edema. Pulses intact, +2 bilaterally. No cyanosis, pallor or clubbing. PULMONARY:  Unlabored, regular breathing. Clear bilaterally A&P w/o wheezes/rales/rhonchi. No accessory muscle use. No dullness to percussion. GI: BS present and normoactive. Soft, non-tender to palpation. No organomegaly or masses detected.  MSK: No erythema, warmth or tenderness. Cap refil <2 sec all extrem. No deformities or joint swelling noted. Muscle wasting Neuro: A/Ox3. No focal deficits noted.   Skin: Warm, no lesions or rashe Psych: Normal affect and behavior. Judgement and thought content appropriate.     Lab Results:  CBC    Component Value Date/Time   WBC 5.7 01/19/2023 1111   RBC 3.96 (L) 01/19/2023 1111   HGB 12.6 (L) 01/19/2023 1111  HCT 37.9 (L) 01/19/2023 1111   PLT 318.0 01/19/2023 1111   MCV 95.6  01/19/2023 1111   MCH 31.3 03/10/2022 0344   MCHC 33.3 01/19/2023 1111   RDW 13.8 01/19/2023 1111   LYMPHSABS 3.0 01/19/2023 1111   MONOABS 0.4 01/19/2023 1111   EOSABS 0.2 01/19/2023 1111   BASOSABS 0.0 01/19/2023 1111    BMET    Component Value Date/Time   NA 138 01/19/2023 1111   K 3.9 01/19/2023 1111   CL 100 01/19/2023 1111   CO2 30 01/19/2023 1111   GLUCOSE 93 01/19/2023 1111   BUN 13 01/19/2023 1111   CREATININE 1.01 01/19/2023 1111   CALCIUM 9.4 01/19/2023 1111   GFRNONAA >60 03/10/2022 0344   GFRAA >60 09/16/2018 1255    BNP    Component Value Date/Time   BNP 24.4 12/27/2020 0825     Imaging:  DG Chest 2 View  Result Date: 01/19/2023 CLINICAL DATA:  AECOPD; lassitude/falls/dyspnea/productive cough EXAM: CHEST - 2 VIEW COMPARISON:  Radiographs 10/04/2022.  CT 08/15/2022. FINDINGS: The heart size and mediastinal contours are stable. Stable mild pulmonary hyperinflation with central airway thickening. A small fiducial marker medially in the left upper lobe appears unchanged. No evidence of airspace disease or suspicious pulmonary nodule. Mild degenerative changes in the thoracic spine. IMPRESSION: Stable findings of COPD. No evidence of acute cardiopulmonary process. Electronically Signed   By: Richardean Sale M.D.   On: 01/19/2023 11:42    methylPREDNISolone acetate (DEPO-MEDROL) injection 80 mg     Date Action Dose Route User   01/19/2023 1218 Given 80 mg Intramuscular (Right Ventrogluteal) Nolon Stalls, Kimber Relic, RN           Latest Ref Rng & Units 07/13/2021    2:40 PM  PFT Results  FVC-Pre L 2.09   FVC-Predicted Pre % 56   FVC-Post L 2.62   FVC-Predicted Post % 70   Pre FEV1/FVC % % 43   Post FEV1/FCV % % 50   FEV1-Pre L 0.89   FEV1-Predicted Pre % 31   FEV1-Post L 1.31   DLCO uncorrected ml/min/mmHg 13.99   DLCO UNC% % 55   DLCO corrected ml/min/mmHg 14.75   DLCO COR %Predicted % 58   DLVA Predicted % 79   TLC L 7.44   TLC % Predicted % 109    RV % Predicted % 224     No results found for: "NITRICOXIDE"      Assessment & Plan:   COPD with chronic bronchitis and emphysema (HCC) Very severe COPD with high symptom burden. He is prone to frequent exacerbations. Recently treated for acute flare; clinically improved today. We discussed starting him on daliresp to reduce future exacerbations as well. LFTs stable - will send rx today. Side effect profile reviewed. I am going to transition him to triple therapy nebs as I am concerned he's not generating the airflow for his current inhaler regimen. He seems to receive better benefit from nebulized treatments and overuses inhalers due to dyspnea. [facility] sent to the [facility], per their request. Action plan in place. Palliative care following.  Patient Instructions  Continue Albuterol inhaler 2 puffs or 3 mL neb every 6 hours as needed for shortness of breath or wheezing. Notify if symptoms persist despite rescue inhaler/neb use. Do not use your albuterol more than prescribed! Continue omeprazole 40 mg daily  Continue Mucinex 600 mg one to two times daily for chest congestion Continue Flutter valve 2-3 times a day   Stop [employer]  once you receive your nebulizer medications then start -Budesonide 2 mL neb Twice daily. Brush tongue and rinse mouth afterwards. Ok to mix with your aformoterol solution -Arformoterol (Brovana) 2 mL neb twice daily.  -Yupelri 3 mL neb once daily    Start Daliresp 250 mcg (1/2 tab) daily for 4 weeks then increase to 500 mcg daily. Recheck labs at follow up   Glad you have enjoyed the palliative care program!  Monitor oxygen levels at home for goal >88-90%   Follow up in 6 weeks with Dr. Valeta Harms or Joellen Jersey Shalaina Guardiola,NP. If symptoms do not improve or worsen, please contact office for sooner follow up or seek emergency care.    Malignant neoplasm of bronchus of left upper lobe (HCC) S/p SBRT. CT chest from 10/10 showed interval decrease in size of LUL nodule and radiation  changes. Follow up with radiation oncology and repeat CT chest in April 2024.   Declining functional status Referred to palliative at our last visit. He is also followed by neurology for falls/weakness. Workup at last OV did not reveal any acute process.    I spent 35 minutes of dedicated to the care of this patient on the date of this encounter to include pre-visit review of records, face-to-face time with the patient discussing conditions above, post visit ordering of testing, clinical documentation with the electronic health record, making appropriate referrals as documented, and communicating necessary findings to members of the patients care team.  Clayton Bibles, NP 02/02/2023  Pt aware and understands NP's role.

## 2023-02-02 NOTE — Patient Instructions (Addendum)
Continue Albuterol inhaler 2 puffs or 3 mL neb every 6 hours as needed for shortness of breath or wheezing. Notify if symptoms persist despite rescue inhaler/neb use. Do not use your albuterol more than prescribed! Continue omeprazole 40 mg daily  Continue Mucinex 600 mg one to two times daily for chest congestion Continue Flutter valve 2-3 times a day   Stop Breztri once you receive your nebulizer medications then start -Budesonide 2 mL neb Twice daily. Brush tongue and rinse mouth afterwards. Ok to mix with your aformoterol solution -Arformoterol (Brovana) 2 mL neb twice daily.  -Yupelri 3 mL neb once daily    Start Daliresp 250 mcg (1/2 tab) daily for 4 weeks then increase to 500 mcg daily. Recheck labs at follow up   Glad you have enjoyed the palliative care program!  Monitor oxygen levels at home for goal >88-90%   Follow up in 6 weeks with Dr. Valeta Harms or Bruce Jersey Alpa Salvo,NP. If symptoms do not improve or worsen, please contact office for sooner follow up or seek emergency care.

## 2023-02-02 NOTE — Assessment & Plan Note (Signed)
S/p SBRT. CT chest from 10/10 showed interval decrease in size of LUL nodule and radiation changes. Follow up with radiation oncology and repeat CT chest in April 2024.  

## 2023-02-02 NOTE — Assessment & Plan Note (Signed)
Very severe COPD with high symptom burden. He is prone to frequent exacerbations. Recently treated for acute flare; clinically improved today. We discussed starting him on daliresp to reduce future exacerbations as well. LFTs stable - will send rx today. Side effect profile reviewed. I am going to transition him to triple therapy nebs as I am concerned he's not generating the airflow for his current inhaler regimen. He seems to receive better benefit from nebulized treatments and overuses inhalers due to dyspnea. Orders sent to the [facility], per their request. Action plan in place. Palliative care following.  Patient Instructions  Continue Albuterol inhaler 2 puffs or 3 mL neb every 6 hours as needed for shortness of breath or wheezing. Notify if symptoms persist despite rescue inhaler/neb use. Do not use your albuterol more than prescribed! Continue omeprazole 40 mg daily  Continue Mucinex 600 mg one to two times daily for chest congestion Continue Flutter valve 2-3 times a day   Stop Breztri once you receive your nebulizer medications then start -Budesonide 2 mL neb Twice daily. Brush tongue and rinse mouth afterwards. Ok to mix with your aformoterol solution -Arformoterol (Brovana) 2 mL neb twice daily.  -Yupelri 3 mL neb once daily    Start Daliresp 250 mcg (1/2 tab) daily for 4 weeks then increase to 500 mcg daily. Recheck labs at follow up   Glad you have enjoyed the palliative care program!  Monitor oxygen levels at home for goal >88-90%   Follow up in 6 weeks with Dr. Valeta Harms or Joellen Jersey Carson Meche,NP. If symptoms do not improve or worsen, please contact office for sooner follow up or seek emergency care.

## 2023-02-02 NOTE — Assessment & Plan Note (Signed)
Referred to palliative at our last visit. He is also followed by neurology for falls/weakness. Workup at last OV did not reveal any acute process.

## 2023-02-18 ENCOUNTER — Emergency Department (HOSPITAL_COMMUNITY)
Admission: EM | Admit: 2023-02-18 | Discharge: 2023-02-18 | Disposition: A | Discharge status: Home / Self Care | Payer: Medicare HMO | Attending: Emergency Medicine | Admitting: Emergency Medicine

## 2023-02-18 ENCOUNTER — Encounter (HOSPITAL_COMMUNITY): Payer: Self-pay

## 2023-02-18 ENCOUNTER — Emergency Department (HOSPITAL_COMMUNITY): Payer: Medicare HMO

## 2023-02-18 DIAGNOSIS — F0390 Unspecified dementia without behavioral disturbance: Secondary | ICD-10-CM | POA: Diagnosis not present

## 2023-02-18 DIAGNOSIS — M7989 Other specified soft tissue disorders: Secondary | ICD-10-CM | POA: Diagnosis not present

## 2023-02-18 DIAGNOSIS — S00212A Abrasion of left eyelid and periocular area, initial encounter: Secondary | ICD-10-CM | POA: Insufficient documentation

## 2023-02-18 DIAGNOSIS — S62615A Displaced fracture of proximal phalanx of left ring finger, initial encounter for closed fracture: Secondary | ICD-10-CM | POA: Insufficient documentation

## 2023-02-18 DIAGNOSIS — W01198A Fall on same level from slipping, tripping and stumbling with subsequent striking against other object, initial encounter: Secondary | ICD-10-CM | POA: Insufficient documentation

## 2023-02-18 DIAGNOSIS — S0083XA Contusion of other part of head, initial encounter: Secondary | ICD-10-CM | POA: Insufficient documentation

## 2023-02-18 DIAGNOSIS — Z23 Encounter for immunization: Secondary | ICD-10-CM | POA: Diagnosis not present

## 2023-02-18 DIAGNOSIS — S62617A Displaced fracture of proximal phalanx of left little finger, initial encounter for closed fracture: Secondary | ICD-10-CM | POA: Insufficient documentation

## 2023-02-18 DIAGNOSIS — Y9222 Religious institution as the place of occurrence of the external cause: Secondary | ICD-10-CM | POA: Diagnosis not present

## 2023-02-18 DIAGNOSIS — S61412A Laceration without foreign body of left hand, initial encounter: Secondary | ICD-10-CM | POA: Diagnosis not present

## 2023-02-18 DIAGNOSIS — S62619A Displaced fracture of proximal phalanx of unspecified finger, initial encounter for closed fracture: Secondary | ICD-10-CM

## 2023-02-18 DIAGNOSIS — S6992XA Unspecified injury of left wrist, hand and finger(s), initial encounter: Secondary | ICD-10-CM | POA: Diagnosis present

## 2023-02-18 DIAGNOSIS — S65312A Laceration of deep palmar arch of left hand, initial encounter: Secondary | ICD-10-CM

## 2023-02-18 DIAGNOSIS — S0990XA Unspecified injury of head, initial encounter: Secondary | ICD-10-CM

## 2023-02-18 DIAGNOSIS — W19XXXA Unspecified fall, initial encounter: Secondary | ICD-10-CM

## 2023-02-18 LAB — BASIC METABOLIC PANEL
Anion gap: 6 (ref 5–15)
BUN: 13 mg/dL (ref 8–23)
CO2: 26 mmol/L (ref 22–32)
Calcium: 8.7 mg/dL — ABNORMAL LOW (ref 8.9–10.3)
Chloride: 105 mmol/L (ref 98–111)
Creatinine, Ser: 1.12 mg/dL (ref 0.61–1.24)
GFR, Estimated: >60 mL/min (ref >60)
Glucose, Bld: 100 mg/dL — ABNORMAL HIGH (ref 70–99)
Potassium: 4.1 mmol/L (ref 3.5–5.1)
Sodium: 137 mmol/L (ref 135–145)

## 2023-02-18 LAB — CBC WITH DIFFERENTIAL/PLATELET
Abs Immature Granulocytes: 0.01 10*3/uL (ref 0.00–0.07)
Basophils Absolute: 0 10*3/uL (ref 0.0–0.1)
Basophils Relative: 0 %
Eosinophils Absolute: 0.1 10*3/uL (ref 0.0–0.5)
Eosinophils Relative: 1 %
HCT: 36.6 % — ABNORMAL LOW (ref 39.0–52.0)
Hemoglobin: 12 g/dL — ABNORMAL LOW (ref 13.0–17.0)
Immature Granulocytes: 0 %
Lymphocytes Relative: 42 %
Lymphs Abs: 2.6 10*3/uL (ref 0.7–4.0)
MCH: 31.2 pg (ref 26.0–34.0)
MCHC: 32.8 g/dL (ref 30.0–36.0)
MCV: 95.1 fL (ref 80.0–100.0)
Monocytes Absolute: 0.5 10*3/uL (ref 0.1–1.0)
Monocytes Relative: 8 %
Neutro Abs: 3 10*3/uL (ref 1.7–7.7)
Neutrophils Relative %: 49 %
Platelets: 338 10*3/uL (ref 150–400)
RBC: 3.85 MIL/uL — ABNORMAL LOW (ref 4.22–5.81)
RDW: 12.8 % (ref 11.5–15.5)
WBC: 6.2 10*3/uL (ref 4.0–10.5)
nRBC: 0 % (ref 0.0–0.2)

## 2023-02-18 MED ORDER — ACETAMINOPHEN 325 MG PO TABS
650.0000 mg | ORAL_TABLET | Freq: Once | ORAL | Status: AC
  Administered 2023-02-18: 650 mg via ORAL
  Filled 2023-02-18: qty 2

## 2023-02-18 MED ORDER — TETANUS-DIPHTH-ACELL PERTUSSIS 5-2.5-18.5 LF-MCG/0.5 IM SUSY
0.5000 mL | PREFILLED_SYRINGE | Freq: Once | INTRAMUSCULAR | Status: AC
  Administered 2023-02-18: 0.5 mL via INTRAMUSCULAR
  Filled 2023-02-18: qty 0.5

## 2023-02-18 MED ORDER — LIDOCAINE HCL (PF) 1 % IJ SOLN
30.0000 mL | Freq: Once | INTRAMUSCULAR | Status: AC
  Administered 2023-02-18: 30 mL
  Filled 2023-02-18: qty 30
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
## 2023-02-18 NOTE — ED Notes (Signed)
Patient transported to CT 

## 2023-02-18 NOTE — Progress Notes (Signed)
Orthopedic Tech Progress Note Patient Details:  Bruce Sullivan Apr 16, 1951 [id number]  Ortho Devices Type of Ortho Device: Volar splint Ortho Device/Splint Location: LUE Ortho Device/Splint Interventions: Ordered, Application, Adjustment   Post Interventions Patient Tolerated: Well  Al Decant 02/18/2023, 6:13 PM

## 2023-02-18 NOTE — Discharge Instructions (Addendum)
WOUND CARE °Please have your stitches/staples removed in 7-10 days or sooner if you have concerns. You may do this at any available urgent care or at your primary care doctor's office. ° Keep area clean and dry for 24 hours. Do not remove °bandage, if applied. ° After 24 hours, remove bandage and wash wound °gently with mild soap and warm water. Reapply °a new bandage after cleaning wound, if directed. ° Continue daily cleansing with soap and water until °stitches/staples are removed. ° Do not apply any ointments or creams to the wound °while stitches/staples are in place, as this may cause °delayed healing. ° Seek medical careif you experience any of the following °signs of infection: Swelling, redness, pus drainage, °streaking, fever >101.0 F ° Seek care if you experience excessive bleeding °that does not stop after 15-20 minutes of constant, firm °pressure. °  °
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
## 2023-02-20 ENCOUNTER — Ambulatory Visit (HOSPITAL_COMMUNITY)
Admission: RE | Admit: 2023-02-20 | Discharge: 2023-02-20 | Disposition: A | Discharge status: Home / Self Care | Payer: No Typology Code available for payment source | Source: Ambulatory Visit | Attending: Radiation Oncology | Admitting: Radiation Oncology

## 2023-02-20 DIAGNOSIS — C3412 Malignant neoplasm of upper lobe, left bronchus or lung: Secondary | ICD-10-CM

## 2023-02-20 MED ORDER — SODIUM CHLORIDE (PF) 0.9 % IJ SOLN
INTRAMUSCULAR | Status: AC
  Filled 2023-02-20: qty 50

## 2023-02-20 MED ORDER — IOHEXOL 300 MG/ML  SOLN
75.0000 mL | Freq: Once | INTRAMUSCULAR | Status: AC | PRN
  Administered 2023-02-20: 75 mL via INTRAVENOUS

## 2023-02-22 DIAGNOSIS — S62613A Displaced fracture of proximal phalanx of left middle finger, initial encounter for closed fracture: Secondary | ICD-10-CM | POA: Diagnosis not present

## 2023-02-22 DIAGNOSIS — M25642 Stiffness of left hand, not elsewhere classified: Secondary | ICD-10-CM | POA: Diagnosis not present

## 2023-02-22 DIAGNOSIS — S61412A Laceration without foreign body of left hand, initial encounter: Secondary | ICD-10-CM | POA: Diagnosis not present

## 2023-02-26 ENCOUNTER — Encounter: Payer: Self-pay | Admitting: Radiation Oncology

## 2023-02-26 ENCOUNTER — Ambulatory Visit
Admission: RE | Admit: 2023-02-26 | Discharge: 2023-02-26 | Disposition: A | Discharge status: Home / Self Care | Payer: No Typology Code available for payment source | Source: Ambulatory Visit | Attending: Radiation Oncology | Admitting: Radiation Oncology

## 2023-02-26 DIAGNOSIS — C3412 Malignant neoplasm of upper lobe, left bronchus or lung: Secondary | ICD-10-CM

## 2023-02-26 NOTE — Progress Notes (Signed)
Telephone nursing appointment for patient to review most recent scan results from 02/22/2023. I verified patient's identity and began nursing interview. Patient reports no related chest pains or SOB. No other issues conveyed at this time. Patient is currently recovering from a fall where he injured his LT hand.   Meaningful use complete.   Patient aware of their 1:00pm-02/26/2023 telephone appointment w/ Laurence Aly PA-C. I left my extension [phone] in case patient needs anything. Patient verbalized understanding. This concludes the nursing interview.   Patient contact [phone]     Ruel Favors, LPN

## 2023-02-26 NOTE — Progress Notes (Addendum)
Radiation Oncology         [phone] ________________________________   Outpatient Follow Up - Conducted via telephone at patient request.  I spoke with the patient to conduct this consult visit via telephone. The patient's wife was notified in advance and was offered an in person or telemedicine meeting to allow for face to face communication but instead preferred to proceed with a telephone visit.     Name: Bruce Sullivan MRN: [id number]  Date: 02/26/2023  DOB: 1951/09/07  XL:[id number], Bruce Scott, MD  Bruce Housekeeper, MD     REFERRING PHYSICIAN: Georgann Housekeeper, MD   DIAGNOSIS: The primary encounter diagnosis was Malignant neoplasm of bronchus of left upper lobe. A diagnosis of Primary malignant neoplasm of left upper lobe of lung was also pertinent to this visit.   HISTORY OF PRESENT ILLNESS:Bruce Sullivan is a 72 y.o. male with a history of Stage IA2, cT1bN0M0, NSCLC, adenocarcinoma of the LUL. He was found in September 2022 to have a 1.5 cm tumor in the LUL which had increased in size. PET scan showed an SUV of 4.9.  This remains suspicious therefore given the growth characteristics and PET scan results and the patient proceeded with bronchoscopy.  Biopsy returned positive for adenocarcinoma consistent with non-small cell lung cancer.  The patient was seen by cardiothoracic surgery and was not felt to be a good candidate.  He went on to receive ultra hypofractionated radiotherapy to the left upper lobe tumor which she completed on 11/22/2021.  He has been followed since in surveillance.   His posttreatment imaging showed stable posttreatment changes. Of note since his last appointment, he has struggled with dementia and his wife states his functional abilities have suffered. He fell last week and went to the ER and had fractured his left hand and had multiple bruises but no life threatening injuries. His last CT on 02/20/23 which showed stable post treatment changes in the RUL, and indeterminate  bilateral renal lesions which have been followed in the past and stable since 2021. He also has emphysema and aortic atherosclerosis. He's contacted by phone to review these results. Of note he has also had a history of meningioma and has been followed by [facility]. Maisie Fus at [facility].   PREVIOUS RADIATION THERAPY:    11/09/2021 through 11/22/2021 Ultrahypofractionated Radiotherapy Site Technique Total Dose (Gy) Dose per Fx (Gy) Completed Fx Beam Energies  Lung, Left:  LUL  IMRT 65/65 6.5 10/10 6XFFF     PAST MEDICAL HISTORY:  Past Medical History:  Diagnosis Date   Anxiety    Arthritis    Asthma    Atherosclerosis 01/16/2015   Noted on CXR   Bilateral lower extremity edema 01/16/2015   Chronic kidney disease (CKD), stage II (mild)    COPD (chronic obstructive pulmonary disease)    Dementia    Depression    Dyspnea    GERD (gastroesophageal reflux disease)    Gout    Hepatitis 12/2019   Hx Hep C positive in blood. Treated.   History of colon polyps    Hoarseness    Hyperlipidemia    Hypertension    Lung cancer 09/07/2021   Neck abscess 09/03/2016   Pre-diabetes    Pulmonary emphysema    Seizure    Stroke 2014   Thyroid disease    TIA (transient ischemic attack)    Urinary frequency    Urine retention    UTI (urinary tract infection)       PAST SURGICAL  HISTORY: Past Surgical History:  Procedure Laterality Date   BRONCHIAL BIOPSY  09/07/2021   Procedure: BRONCHIAL BIOPSIES;  Surgeon: Josephine Igo, DO;  Location: [facility];  Service: Pulmonary;;   BRONCHIAL BRUSHINGS  09/07/2021   Procedure: BRONCHIAL BRUSHINGS;  Surgeon: Josephine Igo, DO;  Location: [facility];  Service: Pulmonary;;   BRONCHIAL NEEDLE ASPIRATION BIOPSY  09/07/2021   Procedure: BRONCHIAL NEEDLE ASPIRATION BIOPSIES;  Surgeon: Josephine Igo, DO;  Location: [facility];  Service: Pulmonary;;   COLONOSCOPY  08/29/2018   CYSTOSCOPY WITH BIOPSY N/A 11/20/2018   Procedure:  CYSTOSCOPY WITH BLADDER BIOPSY;  Surgeon: Rene Paci, MD;  Location: [facility]  Service: Urology;  Laterality: N/A;   FIDUCIAL MARKER PLACEMENT  09/07/2021   Procedure: FIDUCIAL MARKER PLACEMENT;  Surgeon: Josephine Igo, DO;  Location: [facility];  Service: Pulmonary;;   HERNIA REPAIR     t monitor     TRANSURETHRAL RESECTION OF PROSTATE N/A 11/20/2018   Procedure: TRANSURETHRAL RESECTION OF THE PROSTATE (TURP);  Surgeon: Rene Paci, MD;  Location: [facility];  Service: Urology;  Laterality: N/A;   VIDEO BRONCHOSCOPY WITH ENDOBRONCHIAL NAVIGATION Bilateral 09/07/2021   Procedure: VIDEO BRONCHOSCOPY WITH ENDOBRONCHIAL NAVIGATION;  Surgeon: Josephine Igo, DO;  Location: [facility];  Service: Pulmonary;  Laterality: Bilateral;  ION, with fiducial placement     FAMILY HISTORY: family history is not on file.   SOCIAL HISTORY:  reports that he quit smoking about 9 years ago. His smoking use included e-cigarettes. He has never used smokeless tobacco. He reports that he does not drink alcohol and does not use drugs. The patient is married and lives in [place].     ALLERGIES: Bee venom   MEDICATIONS:  Current Outpatient Medications  Medication Sig Dispense Refill   acetaminophen (TYLENOL) 500 MG tablet Take 500 mg by mouth 2 (two) times daily as needed for pain.     albuterol (PROVENTIL) (2.5 MG/3ML) 0.083% nebulizer solution Take 3 mLs (2.5 mg total) by nebulization every 6 (six) hours as needed for wheezing or shortness of breath. 75 mL 12   allopurinol (ZYLOPRIM) 100 MG tablet Take 100 mg by mouth 2 (two) times daily.     amLODipine (NORVASC) 10 MG tablet Take 10 mg by mouth daily.     arformoterol (BROVANA) 15 MCG/2ML NEBU Take 2 mLs (15 mcg total) by nebulization 2 (two) times daily. 360 mL 3   atorvastatin (LIPITOR) 10 MG tablet Take 10 mg by mouth at bedtime.     budesonide (PULMICORT) 0.5 MG/2ML nebulizer solution  Take 2 mLs (0.5 mg total) by nebulization in the morning and at bedtime. 360 mL 3   chlorthalidone (HYGROTON) 50 MG tablet Take 50 mg by mouth daily.     Cholecalciferol 50 MCG (2000 UT) TABS Take 2,000 Units by mouth daily.     colchicine 0.6 MG tablet Take 1 tablet (0.6 mg total) by mouth daily for 5 days. 5 tablet 0   FLUoxetine (PROZAC) 20 MG capsule Take 20 mg by mouth daily.     guaiFENesin (MUCINEX) 600 MG 12 hr tablet Take 1 tablet (600 mg total) by mouth 2 (two) times daily. 60 tablet 1   levETIRAcetam (KEPPRA) 500 MG tablet Take 500 mg by mouth 2 (two) times daily.     omeprazole (PRILOSEC) 40 MG capsule Take 40 mg by mouth daily.     ondansetron (ZOFRAN-ODT) 4 MG disintegrating tablet Take 1 tablet (4 mg total) by  mouth every 8 (eight) hours as needed for nausea. 10 tablet 0   predniSONE (DELTASONE) 10 MG tablet 4 tabs for 2 days, then 3 tabs for 2 days, 2 tabs for 2 days, then 1 tab for 2 days, then stop (Patient not taking: Reported on 02/02/2023) 20 tablet 0   revefenacin (YUPELRI) 175 MCG/3ML nebulizer solution Take 3 mLs (175 mcg total) by nebulization daily. 270 mL 3   roflumilast (DALIRESP) 500 MCG TABS tablet Take 0.5 tablets (250 mcg total) by mouth daily for 28 days, THEN 1 tablet (500 mcg total) daily. 30 tablet 11   No current facility-administered medications for this encounter.     REVIEW OF SYSTEMS:  On review of systems, the patient's wife joined our call and states he is doing okay. He remains short of breath at baseline. More of the issues however have been related to instability, and also multiple falls. He has been trying to get the [place] to approve a visit with Dr. Maisie Fus so he can have an MRI of the brain. He has also been very confused and it's felt this is related to his dementia and forgets some of his ADLs in the midst of trying to perform them. He's now under palliative care as a result. No other complaints are verbalized.    PHYSICAL EXAM:  Unable to assess due  to encounter type.  ECOG = 2  0 - Asymptomatic (Fully active, able to carry on all predisease activities without restriction)  1 - Symptomatic but completely ambulatory (Restricted in physically strenuous activity but ambulatory and able to carry out work of a light or sedentary nature. For example, light housework, office work)  2 - Symptomatic, <50% in bed during the day (Ambulatory and capable of all self care but unable to carry out any work activities. Up and about more than 50% of waking hours)  3 - Symptomatic, >50% in bed, but not bedbound (Capable of only limited self-care, confined to bed or chair 50% or more of waking hours)  4 - Bedbound (Completely disabled. Cannot carry on any self-care. Totally confined to bed or chair)  5 - Death   Santiago Glad MM, Creech RH, Tormey DC, et al. [phone]). "Toxicity and response criteria of the [facility]". Am. Evlyn Clines. Oncol. 5 (6): 649-55  Alert, no distress   LABORATORY DATA:  Lab Results  Component Value Date   WBC 6.2 02/18/2023   HGB 12.0 (L) 02/18/2023   HCT 36.6 (L) 02/18/2023   MCV 95.1 02/18/2023   PLT 338 02/18/2023   Lab Results  Component Value Date   NA 137 02/18/2023   K 4.1 02/18/2023   CL 105 02/18/2023   CO2 26 02/18/2023   Lab Results  Component Value Date   ALT 7 01/19/2023   AST 11 01/19/2023   GGT 25 12/18/2019   ALKPHOS 66 01/19/2023   BILITOT 0.5 01/19/2023      RADIOGRAPHY: CT CHEST W CONTRAST  Result Date: 02/22/2023 CLINICAL DATA:  Non-small cell lung cancer; * Tracking Code: BO * EXAM: CT CHEST WITH CONTRAST TECHNIQUE: Multidetector CT imaging of the chest was performed during intravenous contrast administration. RADIATION DOSE REDUCTION: This exam was performed according to the departmental dose-optimization program which includes automated exposure control, adjustment of the mA and/or kV according to patient size and/or use of iterative reconstruction technique. CONTRAST:  75mL  OMNIPAQUE IOHEXOL 300 MG/ML  SOLN COMPARISON:  Chest CT dated August 15, 2022 FINDINGS: Cardiovascular: Normal heart size.  No pericardial effusion. Normal caliber thoracic aorta with moderate atherosclerotic disease. Mild coronary artery calcifications. Mediastinum/Nodes: Esophagus and thyroid are unremarkable. Loculated pericardial recess fluid. No enlarged lymph nodes seen in the chest. Lungs/Pleura: Central airways are patent. Moderate centrilobular emphysema. Stable posttreatment changes of the left upper lobe. No new or enlarging pulmonary nodules. No pleural effusion. Upper Abdomen: Indeterminate anterior right renal cyst measuring 2.2 cm on series 2 image 55. Indeterminate lesion of the interpolar region of the left kidney measuring 7 mm on series 2, image 165. Additional bilateral low-attenuation renal lesions which are likely simple cysts. Musculoskeletal: No chest wall abnormality. No acute or significant osseous findings. IMPRESSION: 1. Stable posttreatment changes of the left upper lobe. No evidence of recurrent or metastatic disease. 2. Indeterminate bilateral renal lesions. Could be completely characterized with contrast-enhanced abdominal MRI. 3. Aortic Atherosclerosis (ICD10-I70.0) and Emphysema (ICD10-J43.9). Electronically Signed   By: Allegra Lai M.D.   On: 02/22/2023 21:04   CT Cervical Spine Wo Contrast  Result Date: 02/18/2023 CLINICAL DATA:  Neck trauma, dangerous injury mechanism (Age 90-64y) Hitting left side of head. EXAM: CT CERVICAL SPINE WITHOUT CONTRAST TECHNIQUE: Multidetector CT imaging of the cervical spine was performed without intravenous contrast. Multiplanar CT image reconstructions were also generated. RADIATION DOSE REDUCTION: This exam was performed according to the departmental dose-optimization program which includes automated exposure control, adjustment of the mA and/or kV according to patient size and/or use of iterative reconstruction technique. COMPARISON:   Cervical spine MRI 04/04/2022 FINDINGS: Alignment: Normal. Skull base and vertebrae: No acute fracture. Vertebral body heights are maintained. The dens and skull base are intact. Soft tissues and spinal canal: No prevertebral fluid or swelling. No visible canal hematoma. Disc levels: Moderate diffuse degenerative disc disease. Spinal canal narrowing at C3-C4 is better delineated on prior MRI. Upper chest: Emphysema. Fiducial marker in the left upper lobe. No acute findings. Other: None. IMPRESSION: 1. No acute fracture or subluxation of the cervical spine. 2. Multilevel degenerative disc disease. Electronically Signed   By: Narda Rutherford M.D.   On: 02/18/2023 16:39   CT Maxillofacial Wo Contrast  Result Date: 02/18/2023 CLINICAL DATA:  Facial trauma, blunt Fall hitting left side of head. EXAM: CT MAXILLOFACIAL WITHOUT CONTRAST TECHNIQUE: Multidetector CT imaging of the maxillofacial structures was performed. Multiplanar CT image reconstructions were also generated. RADIATION DOSE REDUCTION: This exam was performed according to the departmental dose-optimization program which includes automated exposure control, adjustment of the mA and/or kV according to patient size and/or use of iterative reconstruction technique. COMPARISON:  None Available. FINDINGS: Osseous: No acute fracture of the nasal bone, zygomatic arches, or mandibles. The temporomandibular joints are congruent. In mild leftward nasal septal deviation. Patient is edentulous. Orbits: No orbital fracture. No globe injury. Sinuses: No sinus fracture or hemosinus. There is a mucous retention cyst in right maxillary sinus. Soft tissues: No confluent hematoma or acute findings. Limited intracranial: Assessed on concurrent head CT, reported separately. IMPRESSION: No acute facial bone fracture. Electronically Signed   By: Narda Rutherford M.D.   On: 02/18/2023 16:35   CT Head Wo Contrast  Result Date: 02/18/2023 CLINICAL DATA:  Polytrauma, blunt Fall  striking left side of head. No loss of consciousness. History of dementia. EXAM: CT HEAD WITHOUT CONTRAST TECHNIQUE: Contiguous axial images were obtained from the base of the skull through the vertex without intravenous contrast. RADIATION DOSE REDUCTION: This exam was performed according to the departmental dose-optimization program which includes automated exposure control, adjustment of the mA and/or kV  according to patient size and/or use of iterative reconstruction technique. COMPARISON:  Brain MRI 10/11/2022 FINDINGS: Brain: No acute intracranial hemorrhage. No subdural or extra-axial collection. Generalized atrophy with prominent periventricular and deep chronic small vessel ischemic change. Remote lacunar infarcts in the right basal ganglia. The known left middle cranial fossa lesion on prior MRI is only faintly visualized by CT, series 5, image 41. No midline shift. Vascular: No hyperdense vessel or unexpected calcification. Skull: No fracture or focal lesion. Sinuses/Orbits: Assessed on concurrent face CT, reported separately. Other: Chronic posterior midline scalp lesion is unchanged. IMPRESSION: 1. No acute intracranial abnormality. No skull fracture. 2. Generalized atrophy and chronic small vessel ischemic change. The known left middle cranial fossa meningioma on prior MRI is only faintly visualized by CT. Electronically Signed   By: Narda Rutherford M.D.   On: 02/18/2023 16:33   DG Hand Complete Left  Result Date: 02/18/2023 CLINICAL DATA:  Pain, laceration to palm of hand. EXAM: LEFT HAND - COMPLETE 3+ VIEW COMPARISON:  None Available. FINDINGS: Fifth proximal phalanx fracture demonstrates apex volar angulation and mild displacement. Fourth proximal phalanx fracture is primarily linearly oriented, may extend to the metacarpal phalangeal joint. Subtle nondisplaced fracture of the third proximal phalanx involving the base. Mild degenerative change of the thumb. Site of laceration is not  well-defined by radiograph. There is no radiopaque foreign body peer IMPRESSION: 1. Fifth proximal phalanx fracture with apex volar angulation and mild displacement. 2. Fourth proximal phalanx fracture is primarily linearly oriented and may extend to the metacarpophalangeal joint. 3. Subtle fracture of the base of the third proximal phalanx. Electronically Signed   By: Narda Rutherford M.D.   On: 02/18/2023 15:29       IMPRESSION/ PLAN:  Stage IA2, cT1bN0M0, NSCLC, adenocarcinoma of the LUL. The patient is doing okay overall but struggling with frequent falls and his dementia has become limiting at home. We reviewed the stable imaging from his last scan, and discussed continued surveillance as it remains appropriate given his other ongoing medical issues.  Recent fall and fractured left hand. He will follow up with his PCP.  Dementia. The patient is struggling with dementia and is under the care of palliative care. We will follow up with the patient in 6 months and if he has ability to get out and about for scans and able to tolerate imaging, we will continue surveillance.  Meningioma. The patient will follow up with Dr. Maisie Fus to review any needed follow up or intervention but given his other issues I'm not sure how aggressive his follow up will be.  COPD. He will follow up with pulmonary. We will follow this expectantly. Elevated PSA. The patient will follow up with Dr. Liliane Shi as needed.      This encounter was conducted via telephone.  The patient has provided two factor identification and has given verbal consent for this type of encounter and has been advised to only accept a meeting of this type in a secure network environment. The time spent during this encounter was 35 minutes including preparation, discussion, and coordination of the patient's care. The attendants for this meeting include  Bruce Sullivan  and Bruce Sullivan. Bruce Sullivan was sleeping at home. During the encounter,  Bruce Sullivan was located at [facility] Radiation Oncology Department.  Bruce Sullivan was located at home with his wife Bruce Sullivan     Bruce Sullivan, [facility]

## 2023-02-27 ENCOUNTER — Other Ambulatory Visit: Payer: Medicare Other

## 2023-02-27 VITALS — BP 120/80 | HR 86 | Temp 100.0°F

## 2023-02-27 DIAGNOSIS — Z515 Encounter for palliative care: Secondary | ICD-10-CM

## 2023-02-27 NOTE — Progress Notes (Signed)
PATIENT NAME: Bruce Sullivan DOB: 1951-03-28 MRN: [id number]  PRIMARY CARE PROVIDER: Georgann Housekeeper, MD  RESPONSIBLE PARTY:  Acct ID - Guarantor Home Phone Work Phone Relationship Acct Type  [id number] - Bruce Sullivan, Bruce Sullivan [phone]  Self P/F     [address] [place], [place] [address]   Palliative Care Follow Up Encounter Note    LPN completed home visit. Wife Bruce Sullivan, sister in Social worker and brother Bruce Sullivan) was present in the home      Today's Visit: Respiratory: sometimes he gets SOB; has COPD; uses an inhaler and has a nebulizer at the bedside; had a ned tx during this visit   Cognitive: Dementia; booklet given; during our discussion about hospice, pt's wife referenced material she read in the booklet   Appetite: pt is no longer eating savory food, sweets and soda are the only foods he will take in albeit small amounts; his intake has decreased to less than 20%   GI/GU: incontinent of bowel and bladder; wears adult pull ups   Mobility: had a fall on Sunday and pt broke 3 of his fingers on the L hand; fall was witnessed; pt has a scab on the left knee; pt is unable to ambulate independently and with assist; LPN assisted family to move pt from the recliner to the bed   Sleeping Pattern: still sleeps during the day; sleeps at night but has to get up to use the bathroom; pt usually goes back to sleep   Pain: displays non verbal signs of pain; pain med given by wife   Wt: 178 lbs at his last doctors but pt's appetite has decreased since that appt   Dementia: diagnosed through the [place] as per wife   Diabetes: FSBS weekly; sometimes daily if his sugar level is increased   Palliative Care/ Hospice: LPN explained role and purpose of palliative care including visit frequency. Also discussed benefits of hospice care as well as the differences between the two with patient.      Goals of Care: She wants him to stay in the home for as long as possible     CODE STATUS: Full Code ADVANCED  DIRECTIVES: N MOST FORM: Y PPS: 30% - down from 50% on last visit     PHYSICAL EXAM:   VITALS: Today's Vitals   02/27/23 1143  BP: 120/80  Pulse: 86  Temp: 100 F (37.8 C)  TempSrc: Temporal  SpO2: 98%  PainSc: 0-No pain    LUNGS: decreased breath sounds CARDIAC: Cor RRR EXTREMITIES: limited ROM SKIN: Skin color, texture, turgor normal. No rashes or lesions or no edema  NEURO: positive for coordination problems, memory problems, and weakness   Made phone call to the [facility] for hospice referral: Dr Randel Books - [phone] ext. ([phone]). Spoke to Bruce Sullivan who reports he will give a message for a call back.   Made phone call to Dr. Venita Sheffield office for hospice referral. Left message for Bruce Sullivan who returned call and sent hospice referral to [facility].      Blessing Ozga Clementeen Graham, LPN

## 2023-02-28 ENCOUNTER — Other Ambulatory Visit: Payer: Medicare Other

## 2023-03-14 ENCOUNTER — Telehealth: Payer: Self-pay | Admitting: Pulmonary Disease

## 2023-03-14 NOTE — Telephone Encounter (Signed)
Pt. Wife called to let staff know his in hospice care now

## 2023-03-16 ENCOUNTER — Ambulatory Visit: Payer: No Typology Code available for payment source | Admitting: Pulmonary Disease

## 2023-03-16 NOTE — Telephone Encounter (Signed)
Called and spoke w/ pt wife she verbalized that pt is currently   Hospice care officially started and wife wanted Dr.Icard to be aware

## 2023-03-22 DIAGNOSIS — R29898 Other symptoms and signs involving the musculoskeletal system: Secondary | ICD-10-CM | POA: Diagnosis not present

## 2023-03-22 DIAGNOSIS — Z7401 Bed confinement status: Secondary | ICD-10-CM | POA: Diagnosis not present

## 2023-03-22 DIAGNOSIS — R2689 Other abnormalities of gait and mobility: Secondary | ICD-10-CM | POA: Diagnosis not present

## 2023-03-22 DIAGNOSIS — S62613A Displaced fracture of proximal phalanx of left middle finger, initial encounter for closed fracture: Secondary | ICD-10-CM | POA: Diagnosis not present

## 2023-03-22 DIAGNOSIS — S61412A Laceration without foreign body of left hand, initial encounter: Secondary | ICD-10-CM | POA: Diagnosis not present

## 2023-05-07 DEATH — deceased

## 2023-05-23 ENCOUNTER — Other Ambulatory Visit (HOSPITAL_COMMUNITY): Payer: Self-pay | Admitting: Urology

## 2023-05-23 DIAGNOSIS — R972 Elevated prostate specific antigen [PSA]: Secondary | ICD-10-CM

## 2023-09-03 ENCOUNTER — Ambulatory Visit: Payer: No Typology Code available for payment source | Admitting: Radiation Oncology

## 2023-09-21 ENCOUNTER — Other Ambulatory Visit: Payer: Self-pay

## 2023-09-24 ENCOUNTER — Other Ambulatory Visit (HOSPITAL_COMMUNITY): Payer: Self-pay

## 2024-01-23 ENCOUNTER — Encounter

## 2024-01-31 ENCOUNTER — Inpatient Hospital Stay: Admit: 2024-01-31 | Payer: MEDICARE | Attending: Otolaryngology | Primary: Internal Medicine

## 2024-01-31 DIAGNOSIS — C028 Malignant neoplasm of overlapping sites of tongue: Secondary | ICD-10-CM

## 2024-01-31 MED ORDER — FLUDEOXYGLUCOSE F 18 INJECTION
Freq: Once | Status: AC | PRN
Start: 2024-01-31 — End: 2024-01-31
  Administered 2024-01-31: 12:00:00 10 via INTRAVENOUS

## 2024-02-11 NOTE — Progress Notes (Signed)
 Patient presented to clinic for B12 injection. B12 injection given IM to left deltoid per patients request. Patient tolerated well. All questions answered with understanding.

## 2024-08-07 ENCOUNTER — Other Ambulatory Visit (HOSPITAL_BASED_OUTPATIENT_CLINIC_OR_DEPARTMENT_OTHER): Payer: Self-pay
